# Patient Record
Sex: Male | Born: 1945 | Race: White | Hispanic: No | Marital: Married | Smoking: Former smoker
Health system: Southern US, Community
[De-identification: ages and names within clinical notes are randomized; demographics above are authoritative.]

## PROBLEM LIST (undated history)

## (undated) DIAGNOSIS — C801 Malignant (primary) neoplasm, unspecified: Secondary | ICD-10-CM

## (undated) DIAGNOSIS — I10 Essential (primary) hypertension: Secondary | ICD-10-CM

## (undated) DIAGNOSIS — M21371 Foot drop, right foot: Secondary | ICD-10-CM

## (undated) DIAGNOSIS — N184 Chronic kidney disease, stage 4 (severe): Secondary | ICD-10-CM

## (undated) DIAGNOSIS — I313 Pericardial effusion (noninflammatory): Secondary | ICD-10-CM

## (undated) DIAGNOSIS — I2510 Atherosclerotic heart disease of native coronary artery without angina pectoris: Secondary | ICD-10-CM

## (undated) DIAGNOSIS — I3139 Other pericardial effusion (noninflammatory): Secondary | ICD-10-CM

## (undated) DIAGNOSIS — M1990 Unspecified osteoarthritis, unspecified site: Secondary | ICD-10-CM

## (undated) DIAGNOSIS — Z8701 Personal history of pneumonia (recurrent): Secondary | ICD-10-CM

## (undated) DIAGNOSIS — I5020 Unspecified systolic (congestive) heart failure: Secondary | ICD-10-CM

## (undated) DIAGNOSIS — Z95810 Presence of automatic (implantable) cardiac defibrillator: Secondary | ICD-10-CM

## (undated) DIAGNOSIS — L0390 Cellulitis, unspecified: Secondary | ICD-10-CM

## (undated) DIAGNOSIS — Z9289 Personal history of other medical treatment: Secondary | ICD-10-CM

## (undated) HISTORY — PX: SKIN CANCER EXCISION: SHX779

## (undated) HISTORY — PX: EYE SURGERY: SHX253

## (undated) HISTORY — PX: INSERT / REPLACE / REMOVE PACEMAKER: SUR710

## (undated) HISTORY — PX: OTHER SURGICAL HISTORY: SHX169

---

## 1973-02-13 HISTORY — PX: KIDNEY TRANSPLANT: SHX239

## 1990-03-15 HISTORY — PX: CORONARY ANGIOPLASTY: SHX604

## 2007-09-07 ENCOUNTER — Ambulatory Visit: Payer: Self-pay | Admitting: Cardiology

## 2008-02-12 ENCOUNTER — Other Ambulatory Visit: Payer: Self-pay | Admitting: Orthopedic Surgery

## 2008-02-16 ENCOUNTER — Ambulatory Visit (HOSPITAL_COMMUNITY): Admission: RE | Admit: 2008-02-16 | Discharge: 2008-02-16 | Payer: Self-pay | Admitting: Radiation Oncology

## 2009-11-15 DIAGNOSIS — Z8701 Personal history of pneumonia (recurrent): Secondary | ICD-10-CM

## 2009-11-15 HISTORY — DX: Personal history of pneumonia (recurrent): Z87.01

## 2011-03-30 NOTE — Op Note (Signed)
NAME:  [name], [name] NO.:  [id number]   MEDICAL RECORD NO.:  [id number]          PATIENT TYPE:  AMB   LOCATION:  SDS                          FACILITY:  [facility]   PHYSICIAN:  Leonides Grills, M.D.     DATE OF BIRTH:  18-Oct-1946   DATE OF PROCEDURE:  02/16/2008  DATE OF DISCHARGE:                               OPERATIVE REPORT   PREOPERATIVE DIAGNOSES:  1. Left cavus foot.  2. Left base of fifth metatarsal spur.  3. Left tight gastrocnemius.   POSTOPERATIVE DIAGNOSES:  1. Left cavus foot.  2. Left base of fifth metatarsal spur.  3. Left tight gastrocnemius   OPERATION:  1. Dorsiflexion left first metatarsal osteotomy.  2. Stress x-rays left foot.  3. Left gastrocnemius slide.  4. Partial excision plantar aspect base left fifth metatarsal.   ANESTHESIA:  General.   SURGEON:  Leonides Grills, M.D.   ASSISTANT:  Richardean Canal, P.A.   ESTIMATED BLOOD LOSS:  Minimal.   TOURNIQUET TIME:  Approximately an hour.   COMPLICATIONS:  None.   DISPOSITION:  Stable to PR.   INDICATIONS:  This is a 65 year old male who has had a longstanding  painful callous with impending ulcer on the lateral aspect of the right  fifth base of the metatarsal with advanced cavus foot due to plantar  flexed first ray and a tight gastroc.  He was explained the above  procedure, all risks which include infection, neurovascular injury,  nonunion and malunion, hardware rotation or hardware failure, persistent  pain, worsening pain, prolonged recovery, persistent ulceration, wound  complications were all explained.  Questions were encouraged and  answered.   PROCEDURE IN DETAIL:  The patient was brought to the operating room and  placed in supine position after adequate general endotracheal anesthesia  was administered as well as Ancef 1 gram IV piggyback.  The left lower  extremity was then prepped and draped in a sterile manner with a  proximally placed thigh tourniquet.  A longitudinal  incision was made  over the medial aspect of the gastrocnemius musculotendinous junction.  Dissection was carried down through skin.  Hemostasis was obtained.  Fascia was opened in line with the incision.  Conjoined region was then  developed between the gastroc and soleus.  Soft tissue was elevated off  the posterior aspect of the gastrocnemius.  Sural nerve was identified  and protected posteriorly.  The gastrocnemius was then released with  curved Mayo scissors.  This then released the tight gastroc.  The area  was copiously irrigated with normal saline.  Subcu was closed with 3-0  Vicryl.  Skin was closed with 4-0 nylon.  The limb was then gravity  exsanguinated.  Tourniquet was elevated at 290 mmHg.  A longitudinal  incision was just made on the dorsal lateral aspect of the base of the  left fifth metatarsal.  Dissection was carried down directly to bone and  soft tissue was elevated off the base of the fifth metatarsal.  There  was no purulence.  Then with a sagittal saw in the transverse plane the  base  of the fifth metatarsal was then removed.  Once this was done bone  wax was applied to the exposed bony surfaces.  X-rays were obtained in  the lateral plane that showed that this was adequately decompressed.  The area was copiously irrigated with normal saline.  We then made a  longitudinal incision just medial to the EHL tendon.  Dissection was  carried down through skin and hemostasis was obtained.  First TMT joint  was then entered.  Approximately a cm distal to this an osteotomy was  then made closing wedge type with a sagittal saw protecting the soft  tissues medially and laterally.  Once this was done a four hole T plate  from the mini frag set was then applied.  We then placed four 2.0 mm  fully threaded cortical set screws using 1.5 mm respectively.  This had  excellent purchase and maintenance of the compression that was applied  through a 2 point reduction clamp.  Once  this was done the area was  copiously irrigated with normal saline.  Tourniquet was deflated.  Hemostasis was obtained.  There was no pulsatile bleeding.  Subcu was  closed with 0 Vicryl.  Skin was closed with 4-0 nylon.  Over all wounds  sterile dressing was applied.  Modified Shadoan dressing was applied.  A  CAM walker boot was applied.  The patient was stable to the PR.      Leonides Grills, M.D.  Electronically Signed     PB/MEDQ  D:  02/16/2008  T:  02/16/2008  Job:  [id number]

## 2011-08-09 LAB — BASIC METABOLIC PANEL
BUN: 26 — ABNORMAL HIGH
Calcium: 9.2
Creatinine, Ser: 1.37
GFR calc non Af Amer: 53 — ABNORMAL LOW
Glucose, Bld: 162 — ABNORMAL HIGH

## 2011-08-10 LAB — APTT: aPTT: 22 — ABNORMAL LOW

## 2011-08-10 LAB — PROTIME-INR: Prothrombin Time: 12.2

## 2011-08-10 LAB — CBC
Platelets: 141 — ABNORMAL LOW
RDW: 14.8
WBC: 8

## 2011-08-10 LAB — BASIC METABOLIC PANEL
BUN: 27 — ABNORMAL HIGH
Creatinine, Ser: 1.37
GFR calc non Af Amer: 53 — ABNORMAL LOW
Glucose, Bld: 179 — ABNORMAL HIGH

## 2012-01-14 DIAGNOSIS — I5020 Unspecified systolic (congestive) heart failure: Secondary | ICD-10-CM

## 2012-01-14 HISTORY — DX: Unspecified systolic (congestive) heart failure: I50.20

## 2012-04-05 ENCOUNTER — Encounter (HOSPITAL_BASED_OUTPATIENT_CLINIC_OR_DEPARTMENT_OTHER): Payer: PRIVATE HEALTH INSURANCE | Attending: Plastic Surgery

## 2012-04-05 DIAGNOSIS — N189 Chronic kidney disease, unspecified: Secondary | ICD-10-CM | POA: Insufficient documentation

## 2012-04-05 DIAGNOSIS — T8189XA Other complications of procedures, not elsewhere classified, initial encounter: Secondary | ICD-10-CM | POA: Insufficient documentation

## 2012-04-05 DIAGNOSIS — Z940 Kidney transplant status: Secondary | ICD-10-CM | POA: Insufficient documentation

## 2012-04-05 DIAGNOSIS — Z950 Presence of cardiac pacemaker: Secondary | ICD-10-CM | POA: Insufficient documentation

## 2012-04-05 DIAGNOSIS — Y849 Medical procedure, unspecified as the cause of abnormal reaction of the patient, or of later complication, without mention of misadventure at the time of the procedure: Secondary | ICD-10-CM | POA: Insufficient documentation

## 2012-04-05 DIAGNOSIS — Z85828 Personal history of other malignant neoplasm of skin: Secondary | ICD-10-CM | POA: Insufficient documentation

## 2012-04-05 DIAGNOSIS — Z79899 Other long term (current) drug therapy: Secondary | ICD-10-CM | POA: Insufficient documentation

## 2012-04-05 DIAGNOSIS — E119 Type 2 diabetes mellitus without complications: Secondary | ICD-10-CM | POA: Insufficient documentation

## 2012-04-05 DIAGNOSIS — I129 Hypertensive chronic kidney disease with stage 1 through stage 4 chronic kidney disease, or unspecified chronic kidney disease: Secondary | ICD-10-CM | POA: Insufficient documentation

## 2012-04-05 DIAGNOSIS — I519 Heart disease, unspecified: Secondary | ICD-10-CM | POA: Insufficient documentation

## 2012-04-05 NOTE — Progress Notes (Signed)
[facility]  NAME:  [name], [name] NO.:  [id number]  MEDICAL RECORD NO.:  [id number]      DATE OF BIRTH:  [birth year]  PHYSICIAN:  Ardath Sax, M.D.           VISIT DATE:                                  OFFICE VISIT   This is a 66 year old gentleman, who comes to [place] because of 2 lesions on his left leg; the superior one up around the knee is about 2 x 2 cm and then he has a smaller one just above the ankle anteriorly that is only about 3 or 4 mm.  These all came from biopsies and excisions of squamous cell carcinomas which she tends to get and the one superiorly has got a lot of devascularized infected tissue that I debrided off today.  This man has many comorbidities including heart disease, diabetes, he has a pacemaker.  He was recently in the hospital where apparently he had several cardiac arrests and had to be resuscitated, and he now has a pacemaker.  He also is on many medicines, most notably Prograf to help prevent rejection of his kidney transplant.  He was on dialysis prior to his kidney transplant.  He is going to be taking care of here in the clinic by multiple debridements, and I am going to use Santyl.  He also has a lot of edema, and I am going to give him a week's worth of Lasix. We instructed him how to use the [facility] and he will do this easily.  He is well equipped to take care of these if we instruct him how to do.  He has had congestive heart failure in the past and hypertension along with his renal failure and diabetes, and he is on  Zantac, prednisone 5 mg a day, NovoLog, carvedilol, ferrous sulfate, and vitamins, and Fosamax. He was treated today after we debrided him.  We put Santyl dressings on the upper wound, and smaller lower one, we put some collagen in the wound, and we gave him some from here to change it every day, so he was found to be afebrile.  His blood pressure is 140/84, his temperature is 98.6.  He  will come back here in a week.  DIAGNOSIS: 1. Open wounds of the left leg due to excisions of skin lesions     related to squamous cell carcinoma. 2. Diabetes. 3. Renal failure. 4. Hypertension. 5. Pacemaker for heart block. 6. Renal transplant patient.     Ardath Sax, M.D.     PP/MEDQ  D:  04/05/2012  T:  04/05/2012  Job:  [id number]

## 2012-04-21 ENCOUNTER — Encounter (HOSPITAL_BASED_OUTPATIENT_CLINIC_OR_DEPARTMENT_OTHER): Payer: PRIVATE HEALTH INSURANCE | Attending: Plastic Surgery

## 2012-04-21 ENCOUNTER — Ambulatory Visit (HOSPITAL_COMMUNITY)
Admission: RE | Admit: 2012-04-21 | Discharge: 2012-04-21 | Disposition: A | Discharge status: Home / Self Care | Payer: PRIVATE HEALTH INSURANCE | Source: Ambulatory Visit | Attending: Plastic Surgery | Admitting: Plastic Surgery

## 2012-04-21 ENCOUNTER — Other Ambulatory Visit (HOSPITAL_COMMUNITY): Payer: Self-pay | Admitting: Plastic Surgery

## 2012-04-21 DIAGNOSIS — IMO0002 Reserved for concepts with insufficient information to code with codable children: Secondary | ICD-10-CM | POA: Insufficient documentation

## 2012-04-21 DIAGNOSIS — Z79899 Other long term (current) drug therapy: Secondary | ICD-10-CM | POA: Insufficient documentation

## 2012-04-21 DIAGNOSIS — Z85828 Personal history of other malignant neoplasm of skin: Secondary | ICD-10-CM | POA: Insufficient documentation

## 2012-04-21 DIAGNOSIS — T8189XA Other complications of procedures, not elsewhere classified, initial encounter: Secondary | ICD-10-CM | POA: Insufficient documentation

## 2012-04-21 DIAGNOSIS — Y838 Other surgical procedures as the cause of abnormal reaction of the patient, or of later complication, without mention of misadventure at the time of the procedure: Secondary | ICD-10-CM | POA: Insufficient documentation

## 2012-04-21 DIAGNOSIS — Z940 Kidney transplant status: Secondary | ICD-10-CM | POA: Insufficient documentation

## 2012-04-21 DIAGNOSIS — E119 Type 2 diabetes mellitus without complications: Secondary | ICD-10-CM | POA: Insufficient documentation

## 2012-04-21 NOTE — Progress Notes (Signed)
[facility]  NAME:  [name], [name] NO.:  [id number]  MEDICAL RECORD NO.:  [id number]      DATE OF BIRTH:  15-Jul-1946  PHYSICIAN:  Joanne Gavel, M.D.         VISIT DATE:  04/21/2012                                  OFFICE VISIT   Letter of Medical Necessity.  CHIEF COMPLAINT:  Wounds, left leg.  HISTORY OF PRESENT ILLNESS:  This is a 66 year old diabetic male status post kidney transplant for 40 years.  He is on prednisone and Imuran. He was operated on for skin cancer several months ago.  The wounds have fail to heal.  After debridement of dead tendon and dead fibrinous tissue, the anterior surface of the tibia is easily exposed.  The wound itself is 2.9 x 2.8 cm.  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     Joanne Gavel, M.D.     RA/MEDQ  D:  04/21/2012  T:  04/21/2012  Job:  [id number]

## 2012-04-28 ENCOUNTER — Ambulatory Visit (HOSPITAL_COMMUNITY)
Admission: RE | Admit: 2012-04-28 | Discharge: 2012-04-28 | Disposition: A | Discharge status: Home / Self Care | Payer: PRIVATE HEALTH INSURANCE | Source: Ambulatory Visit | Attending: Plastic Surgery | Admitting: Plastic Surgery

## 2012-04-28 ENCOUNTER — Other Ambulatory Visit (HOSPITAL_BASED_OUTPATIENT_CLINIC_OR_DEPARTMENT_OTHER): Payer: Self-pay | Admitting: General Surgery

## 2012-04-28 ENCOUNTER — Other Ambulatory Visit (HOSPITAL_COMMUNITY): Payer: Self-pay | Admitting: Plastic Surgery

## 2012-04-28 DIAGNOSIS — M86162 Other acute osteomyelitis, left tibia and fibula: Secondary | ICD-10-CM

## 2012-04-28 DIAGNOSIS — Z01818 Encounter for other preprocedural examination: Secondary | ICD-10-CM | POA: Insufficient documentation

## 2012-04-28 DIAGNOSIS — T8189XA Other complications of procedures, not elsewhere classified, initial encounter: Secondary | ICD-10-CM

## 2012-04-28 DIAGNOSIS — Z950 Presence of cardiac pacemaker: Secondary | ICD-10-CM | POA: Insufficient documentation

## 2012-04-28 DIAGNOSIS — I517 Cardiomegaly: Secondary | ICD-10-CM | POA: Insufficient documentation

## 2012-04-28 DIAGNOSIS — R918 Other nonspecific abnormal finding of lung field: Secondary | ICD-10-CM | POA: Insufficient documentation

## 2012-04-28 DIAGNOSIS — I700 Atherosclerosis of aorta: Secondary | ICD-10-CM | POA: Insufficient documentation

## 2012-05-10 ENCOUNTER — Encounter (HOSPITAL_BASED_OUTPATIENT_CLINIC_OR_DEPARTMENT_OTHER): Payer: PRIVATE HEALTH INSURANCE

## 2012-05-15 ENCOUNTER — Encounter (HOSPITAL_BASED_OUTPATIENT_CLINIC_OR_DEPARTMENT_OTHER): Payer: PRIVATE HEALTH INSURANCE | Attending: Plastic Surgery

## 2012-05-15 DIAGNOSIS — I509 Heart failure, unspecified: Secondary | ICD-10-CM | POA: Insufficient documentation

## 2012-05-15 DIAGNOSIS — I2510 Atherosclerotic heart disease of native coronary artery without angina pectoris: Secondary | ICD-10-CM | POA: Insufficient documentation

## 2012-05-15 DIAGNOSIS — Z950 Presence of cardiac pacemaker: Secondary | ICD-10-CM | POA: Insufficient documentation

## 2012-05-15 DIAGNOSIS — Z992 Dependence on renal dialysis: Secondary | ICD-10-CM | POA: Insufficient documentation

## 2012-05-15 DIAGNOSIS — I10 Essential (primary) hypertension: Secondary | ICD-10-CM | POA: Insufficient documentation

## 2012-05-15 DIAGNOSIS — L97809 Non-pressure chronic ulcer of other part of unspecified lower leg with unspecified severity: Secondary | ICD-10-CM | POA: Insufficient documentation

## 2012-05-15 DIAGNOSIS — Z85828 Personal history of other malignant neoplasm of skin: Secondary | ICD-10-CM | POA: Insufficient documentation

## 2012-05-15 DIAGNOSIS — Z940 Kidney transplant status: Secondary | ICD-10-CM | POA: Insufficient documentation

## 2012-05-15 DIAGNOSIS — IMO0002 Reserved for concepts with insufficient information to code with codable children: Secondary | ICD-10-CM | POA: Insufficient documentation

## 2012-05-15 DIAGNOSIS — Z79899 Other long term (current) drug therapy: Secondary | ICD-10-CM | POA: Insufficient documentation

## 2012-05-15 DIAGNOSIS — E1169 Type 2 diabetes mellitus with other specified complication: Secondary | ICD-10-CM | POA: Insufficient documentation

## 2012-05-15 DIAGNOSIS — Z7982 Long term (current) use of aspirin: Secondary | ICD-10-CM | POA: Insufficient documentation

## 2012-05-16 NOTE — Progress Notes (Signed)
[facility]  NAME:  [name], [name] NO.:  [id number]  MEDICAL RECORD NO.:  [id number]      DATE OF BIRTH:  [birth year]  PHYSICIAN:  Wayland Denis, DO       VISIT DATE:  05/15/2012                                  OFFICE VISIT   The patient is a 66 year old gentleman, who is here for followup on his left lower extremity ulcer.  He has been using Santyl and alginate. This was originally a skin cancer that was excised, and he has had a tremendous amount of pain in that leg over the last few days.  He does contribute it to walking quite a bit at [employer] with his wife and noted that when he rested on Sunday, there was a great improvement in the leg pain.  He had a squamous cell carcinoma with excision and negative margins.  The patient had a kidney transplant 39 years ago and is on prednisone.  He has multiple other medical conditions as well, including angioplasty, heart catheterization, fistula placement with kidney dialysis, coronary artery disease, hypertension, and congestive heart failure.  He had a pacemaker placed recently, so we would not be able to do an MRI.  MEDICATIONS:  Include aspirin, simvastatin, Zantac, ranitidine, prednisone, NitroQuick, Xolair, Levemir, NovoLog, Carvedilol, Lasix, iron, and he stopped his Fosamax about a week ago.  He did take vitamin C and fish oil.  ALLERGIES:  He does not have any allergies.  PHYSICAL EXAMINATION:  GENERAL:  He is alert, oriented, cooperative, not in any acute distress.  He is pleasant. HEENT:  Pupils are equal.  Extraocular muscles are intact.  No cervical lymphadenopathy.  He has corneal exposure of his lower eyelids with poor lower eyelid function, but he does see an eye doctor.  He has scleral shell as well. SKIN:  He has very poor skin throughout likely secondary to the immunosuppression and a history of skin cancer. LUNGS:  Breathing is unlabored. HEART:  Regular. ABDOMEN:   Soft.  The wound is showing signs of healing and epithelialization on the edges with granulation.  Recommend continuing with the Santyl alginate, and we will re-examine in 1 week.  I recommend compression stockings and we may need to wrap this next week, but it is high up in the leg and the upper- third of the lower leg, the end of the wrap was going to hit just above the ulcer and I am concerned that it might cause some irritation.     Wayland Denis, DO    CS/MEDQ  D:  05/15/2012  T:  05/15/2012  Job:  [id number]

## 2012-05-19 ENCOUNTER — Encounter (HOSPITAL_BASED_OUTPATIENT_CLINIC_OR_DEPARTMENT_OTHER): Payer: PRIVATE HEALTH INSURANCE

## 2012-05-22 NOTE — Progress Notes (Signed)
[facility]  NAME:  SHONDELL, POULSON NO.:  [id number]  MEDICAL RECORD NO.:  [id number]      DATE OF BIRTH:  [birth year]  PHYSICIAN:  Wayland Denis, DO            VISIT DATE:                                  OFFICE VISIT   The patient is a 66 year old gentleman who is here for a followup on his left lower extremity ulcer that is just distal to his knee.  He has been treated with Santyl over the past week with really good improvement.  We were able to debride it.  With those notes noted in the note chart, he has good granulation tissue underneath it.  There has been no change in his medications or social history.  He did check with his renal doctor and was okayed to take the doxycycline 100 mg 1 twice a day, and he will check about the vitamin A as well.  PHYSICAL EXAMINATION:  GENERAL:  He is alert, oriented, cooperative, not in any acute distress.  He is pleasant. HEENT:  Pupils equal.  Extraocular muscles intact.  No cervical lymphadenopathy.  Breathing is unlabored. HEART:  Regular.  The wound is described above.  We will continue with the Santyl and add Collagen and hopefully next week, we will be able to add an Oasis.  I think that would help this to heal much faster.  In the meantime, he will check on the vitamin A.     Wayland Denis, DO     CS/MEDQ  D:  05/22/2012  T:  05/22/2012  Job:  [id number]

## 2012-05-29 NOTE — Progress Notes (Signed)
[facility]  NAME:  [name], [name] NO.:  [id number]  MEDICAL RECORD NO.:  [id number]      DATE OF BIRTH:  [birth year]  PHYSICIAN:  Wayland Denis, DO       VISIT DATE:  05/29/2012                                  OFFICE VISIT   The patient is a 66 year old gentleman here for followup on his left lower extremity ulcer.  He has diabetes and had a kidney transplant number of years ago.  He has been using Santyl and collagen over this past week.  The wound looks good.  He complains of a lot of pain around his knee even tender to touch.  This does not look like it is related to the wound, but looks more like a bursitis.  The wound is granulating, there, it does not look infected.  He did have a previous x-ray that was worrisome for osteo, but we are not able to get an MRI because of his pacemaker.  So, we need to have to get a CT or PET scan probably with contrast, which would affect his kidneys.  He is on a generic type of Accutane.  So, we are going to have him hold off on the vitamin A.  No other change in his medications and he is still taking the doxycycline.  SOCIAL HISTORY:  Unchanged.  PHYSICAL EXAMINATION:  He is alert, oriented, cooperative, not in any acute distress.  He is pleasant.  Pupils are equal.  Extraocular muscles intact.  No cervical lymphadenopathy.  His breathing is unlabored and his heart is regular.  The wound is as noted above and noted in the nurse's notes.  So, Oasis was placed.  After time-out was called, all information was confirmed to be correct, 100% of it was used with sterile technique.  Dressing was applied and we will see him back in a week.  We will also refer him to Dr. Lajoyce Corners, refilled the doxycycline and have him hold the vitamin A.     Alan Ripper Sanger, DO     CS/MEDQ  D:  05/29/2012  T:  05/29/2012  Job:  [id number]

## 2012-06-06 NOTE — Progress Notes (Signed)
[facility]  NAME:  [name], STRAKA NO.:  [id number]  MEDICAL RECORD NO.:  [id number]      DATE OF BIRTH:  October 21, 1946  PHYSICIAN:  Wayland Denis, DO       VISIT DATE:  06/05/2012                                  OFFICE VISIT   The patient is a 66 year old gentleman who is here for follow up on his left lower extremity ulcer.  He had Oasis placed last week and he has some improvement with granulation and a little epithelialization at the edges.  He also want to see Dr. August Saucer and had his knee tapped and cortisone injected and he feels much better.  He said the pain is nearly all resolved.  Then, he felt the improvement in 2 day's time.  There has been no change in his medications or social history.  On exam, he is alert, oriented, cooperative, not in any acute distress. He is pleasant.  Pupils are equal.  Extraocular muscles are intact.  No cervical lymphadenopathy.  His breathing is unlabored.  His heart is regular.  The Oasis was placed according to the manufacture guidelines, all of that was used.  It was activated with normal saline and the dressing was applied.  We will see him back in 1 week.     Wayland Denis, DO     CS/MEDQ  D:  06/05/2012  T:  06/06/2012  Job:  [id number]

## 2012-06-13 ENCOUNTER — Other Ambulatory Visit: Payer: Self-pay | Admitting: Orthopedic Surgery

## 2012-06-13 DIAGNOSIS — R531 Weakness: Secondary | ICD-10-CM

## 2012-06-13 DIAGNOSIS — M25562 Pain in left knee: Secondary | ICD-10-CM

## 2012-06-13 NOTE — Progress Notes (Signed)
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  He had Oasis placed last week and it shows some very good improvement.  He complains of some pain at the knee area and said that it felt better after it was tapped, but his doctors out of town.  I talk to him about calling to have somebody who is covering for him, deal with the pain that is related to that since it is not related to the wound.  There has been no change in his medications or social history.  PHYSICAL EXAMINATION:  GENERAL:  He is alert, oriented, cooperative, not in any acute distress.  He is pleasant. HEENT:  Pupils equal.  Extraocular muscles are intact.  Oasis was applied according to the manufacture guidelines and activated with normal saline.  After time-out was called, all information was confirmed to be correct.  All of the Oasis was used and dressing was then applied, and he will follow up in 1 week.     Wayland Denis, DO     CS/MEDQ  D:  06/12/2012  T:  06/13/2012  Job:  [id number]

## 2012-06-14 ENCOUNTER — Ambulatory Visit
Admission: RE | Admit: 2012-06-14 | Discharge: 2012-06-14 | Disposition: A | Discharge status: Home / Self Care | Payer: PRIVATE HEALTH INSURANCE | Source: Ambulatory Visit | Attending: Orthopedic Surgery | Admitting: Orthopedic Surgery

## 2012-06-14 DIAGNOSIS — R531 Weakness: Secondary | ICD-10-CM

## 2012-06-14 DIAGNOSIS — M25562 Pain in left knee: Secondary | ICD-10-CM

## 2012-06-14 MED ORDER — IOHEXOL 180 MG/ML  SOLN
25.0000 mL | Freq: Once | INTRAMUSCULAR | Status: AC | PRN
  Administered 2012-06-14: 25 mL via INTRA_ARTICULAR

## 2012-06-16 ENCOUNTER — Encounter (HOSPITAL_BASED_OUTPATIENT_CLINIC_OR_DEPARTMENT_OTHER): Payer: PRIVATE HEALTH INSURANCE

## 2012-06-19 ENCOUNTER — Encounter (HOSPITAL_BASED_OUTPATIENT_CLINIC_OR_DEPARTMENT_OTHER): Payer: PRIVATE HEALTH INSURANCE | Attending: Plastic Surgery

## 2012-06-19 DIAGNOSIS — Z7982 Long term (current) use of aspirin: Secondary | ICD-10-CM | POA: Insufficient documentation

## 2012-06-19 DIAGNOSIS — I2510 Atherosclerotic heart disease of native coronary artery without angina pectoris: Secondary | ICD-10-CM | POA: Insufficient documentation

## 2012-06-19 DIAGNOSIS — L97809 Non-pressure chronic ulcer of other part of unspecified lower leg with unspecified severity: Secondary | ICD-10-CM | POA: Insufficient documentation

## 2012-06-19 DIAGNOSIS — Z992 Dependence on renal dialysis: Secondary | ICD-10-CM | POA: Insufficient documentation

## 2012-06-19 DIAGNOSIS — IMO0002 Reserved for concepts with insufficient information to code with codable children: Secondary | ICD-10-CM | POA: Insufficient documentation

## 2012-06-19 DIAGNOSIS — Z79899 Other long term (current) drug therapy: Secondary | ICD-10-CM | POA: Insufficient documentation

## 2012-06-19 DIAGNOSIS — Z950 Presence of cardiac pacemaker: Secondary | ICD-10-CM | POA: Insufficient documentation

## 2012-06-19 DIAGNOSIS — Z940 Kidney transplant status: Secondary | ICD-10-CM | POA: Insufficient documentation

## 2012-06-19 DIAGNOSIS — E1169 Type 2 diabetes mellitus with other specified complication: Secondary | ICD-10-CM | POA: Insufficient documentation

## 2012-06-19 DIAGNOSIS — I10 Essential (primary) hypertension: Secondary | ICD-10-CM | POA: Insufficient documentation

## 2012-06-19 DIAGNOSIS — I509 Heart failure, unspecified: Secondary | ICD-10-CM | POA: Insufficient documentation

## 2012-06-19 DIAGNOSIS — Z85828 Personal history of other malignant neoplasm of skin: Secondary | ICD-10-CM | POA: Insufficient documentation

## 2012-06-20 NOTE — Progress Notes (Signed)
[facility]  NAME:  [name], [name] NO.:  [id number]  MEDICAL RECORD NO.:  [id number]      DATE OF BIRTH:  29-Jan-1946  PHYSICIAN:  Wayland Denis, DO       VISIT DATE:  06/19/2012                                  OFFICE VISIT   The patient is a 66 year old gentleman who is here for followup on his left lower extremity ulcer.  He has been using the Oasis over the past week.  He also went to the orthopedic surgeon and had a procedure done on his knee and he says he is feeling much, much better.  The wound overall is looking better as well.  There has been no change in medications or social history.  The only change in his medical history is the left knee procedure. Time-out was called.  All information was confirmed to be correct.  Oasis was placed on the wound, all of it was used, it was activated with normal saline and a dressing was applied. We will have the patient in follow up in 1 week.     Wayland Denis, DO     CS/MEDQ  D:  06/19/2012  T:  06/20/2012  Job:  [id number]

## 2012-06-27 NOTE — Progress Notes (Signed)
[facility]  NAME:  [name], [name] NO.:  [id number]  MEDICAL RECORD NO.:  [id number]      DATE OF BIRTH:  01-16-1946  PHYSICIAN:  Wayland Denis, DO       VISIT DATE:  06/26/2012                                  OFFICE VISIT   The patient is a 66 year old white male, who is here for followup on his left lower extremity ulcer.  We have been using oasis 4 times now with good improvement.  He has also had a bit of improvement in the swelling of his knee.  He had injected a week ago and that seems to have helped quite a bit.  He also has a follow up from and there was some discussion about possible surgery or serial injections and he is going to continue to follow up with ortho regarding that.  There has been no change in his medications or social history.  PHYSICAL EXAMINATION:  GENERAL:  On exam, he is alert, oriented, cooperative, not in any acute distress.  He is pleasant. HEENT:  Pupils equal.  Extraocular muscles are intact.  No cervical lymphadenopathy. CHEST:  Breathing unlabored. HEART:  Regular.  The wound is improving with granulation tissue and some epithelialization around the edges. Oasis was placed all of it was used. He was activated with normal saline.  Time-out was called.  All information was confirmed to be correct.  We will continue with the dressing and have him follow up in 1 week.     Wayland Denis, DO     CS/MEDQ  D:  06/26/2012  T:  06/27/2012  Job:  [id number]

## 2012-07-04 NOTE — Progress Notes (Signed)
[facility]  NAME:  [name], [name]                      ACCOUNT NO.:  MEDICAL RECORD NO.:  [id number]      DATE OF BIRTH:  04/25/1946  PHYSICIAN:  Wayland Denis, DO       VISIT DATE:  07/03/2012                                  OFFICE VISIT   The patient is a 66 year old gentleman who is a renal transplant patient.  He is here for followup on his left lower extremity ulcer secondary to excision of a skin cancer.  We have been putting Oasis on, a total of 5 times and is showing very good improvement.  He has had more granulation tissue and increased epithelialization from the edges. There has been no change in his medications or social history.  He did have a squamous cell cancer excised from his left hand with a skin graft placed this past week and that seems to be doing well according to the patient.  On exam, he is alert, oriented, cooperative, not in any acute distress.  Oasis was applied to the leg again.  All of it was used.  It was prepared according to the manufacture guidelines and activated with normal saline and a dressing applied. We will see him back in 1 week.     Wayland Denis, DO     CS/MEDQ  D:  07/03/2012  T:  07/04/2012  Job:  [id number]

## 2012-07-11 NOTE — Progress Notes (Signed)
[facility]  NAME:  [name], CULBERTSON NO.:  [id number]  MEDICAL RECORD NO.:  [id number]      DATE OF BIRTH:  Mar 06, 1946  PHYSICIAN:  Wayland Denis, DO       VISIT DATE:  07/10/2012                                  OFFICE VISIT   The patient is a 66 year old gentleman who is here for followup on his left lower extremity ulcer.  He has had Oasis placed several times with very good improvement.  He has some hypergranulation today, but the periwound area is markedly improved.  His knee is actually got swollen as well.  There has been no change in his medication or social history.  On exam, he is alert, oriented, cooperative, not in any acute distress. He is pleasant.  Pupils are equal.  Extraocular muscles are intact.  No cervical lymphadenopathy.  His breathing is unlabored.  His heart is irregular, more weight oasis was placed.  It was prepared according to the manufacture's guidelines.  All of it was used, it was doubled over and activated with normal saline.  The dressing was then applied.  We will have the patient follow back up in 2 weeks.  He is likely going to be finished with the Lasix, but we will see how it looks at his followup appointment.     Wayland Denis, DO     CS/MEDQ  D:  07/10/2012  T:  07/11/2012  Job:  [id number]

## 2012-07-21 ENCOUNTER — Encounter (HOSPITAL_COMMUNITY): Payer: Self-pay | Admitting: Pharmacy Technician

## 2012-07-21 ENCOUNTER — Other Ambulatory Visit (HOSPITAL_COMMUNITY): Payer: Self-pay | Admitting: Orthopedic Surgery

## 2012-07-24 ENCOUNTER — Encounter (HOSPITAL_COMMUNITY)
Admission: RE | Admit: 2012-07-24 | Discharge: 2012-07-24 | Disposition: A | Discharge status: Home / Self Care | Payer: PRIVATE HEALTH INSURANCE | Source: Ambulatory Visit | Attending: Orthopedic Surgery | Admitting: Orthopedic Surgery

## 2012-07-24 ENCOUNTER — Encounter (HOSPITAL_BASED_OUTPATIENT_CLINIC_OR_DEPARTMENT_OTHER): Payer: PRIVATE HEALTH INSURANCE | Attending: Plastic Surgery

## 2012-07-24 ENCOUNTER — Encounter (HOSPITAL_COMMUNITY): Payer: Self-pay

## 2012-07-24 ENCOUNTER — Encounter (HOSPITAL_COMMUNITY): Payer: Self-pay | Admitting: *Deleted

## 2012-07-24 DIAGNOSIS — L97809 Non-pressure chronic ulcer of other part of unspecified lower leg with unspecified severity: Secondary | ICD-10-CM | POA: Insufficient documentation

## 2012-07-24 DIAGNOSIS — E119 Type 2 diabetes mellitus without complications: Secondary | ICD-10-CM | POA: Insufficient documentation

## 2012-07-24 DIAGNOSIS — Z940 Kidney transplant status: Secondary | ICD-10-CM | POA: Insufficient documentation

## 2012-07-24 DIAGNOSIS — I10 Essential (primary) hypertension: Secondary | ICD-10-CM | POA: Insufficient documentation

## 2012-07-24 LAB — CBC WITH DIFFERENTIAL/PLATELET
Basophils Relative: 0 % (ref 0–1)
Eosinophils Absolute: 0 10*3/uL (ref 0.0–0.7)
HCT: 27.2 % — ABNORMAL LOW (ref 39.0–52.0)
Hemoglobin: 8.4 g/dL — ABNORMAL LOW (ref 13.0–17.0)
Lymphs Abs: 0.3 10*3/uL — ABNORMAL LOW (ref 0.7–4.0)
MCH: 27.5 pg (ref 26.0–34.0)
MCHC: 30.9 g/dL (ref 30.0–36.0)
MCV: 88.9 fL (ref 78.0–100.0)
Monocytes Absolute: 0.9 10*3/uL (ref 0.1–1.0)
Monocytes Relative: 10 % (ref 3–12)

## 2012-07-24 LAB — BASIC METABOLIC PANEL
BUN: 34 mg/dL — ABNORMAL HIGH (ref 6–23)
CO2: 26 mEq/L (ref 19–32)
Chloride: 101 mEq/L (ref 96–112)
Glucose, Bld: 95 mg/dL (ref 70–99)
Potassium: 4.3 mEq/L (ref 3.5–5.1)

## 2012-07-24 LAB — SURGICAL PCR SCREEN: Staphylococcus aureus: POSITIVE — AB

## 2012-07-24 NOTE — Progress Notes (Signed)
I called Cordelia Pen at Dr Alfonso Patten office and asked for orders.

## 2012-07-24 NOTE — Progress Notes (Addendum)
Pt said he had labs drawn last at Dr Marigene Ehlers office in [place], [place].  I called Dr Marigene Ehlers office and spoke with Baron Hamper and asked for Labs and last office notes to be faxed here.  Pt said that his last Creatinine was 1.6 and that it was drawn last week. Pts cardiologist is Dr Graciella Freer in [place], his nephrologist is in [place], [place].

## 2012-07-24 NOTE — Progress Notes (Signed)
Hussain from [facility] of surgery tomorrow . Eula Listen is faxing local rep.
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
## 2012-07-24 NOTE — Pre-Procedure Instructions (Signed)
20 MILIK GILREATH  07/24/2012   Your procedure is scheduled on:  Tuesday, September 10th.  Report to [facility] at 12:30AM.  Call this number if you have problems the morning of surgery: [phone]   Remember:   Do not eat food or anything to drink:After Midnight.    Take these medicines the morning of surgery with A SIP OF WATER: Carvededilol(Coreg), Mycophenolate (Cellcept), Prednisone , Ranitidine (Zantac).  May take Eye Drops.  May take Oxycodone-Acetaminophen (Percocet) if needed.  Stop taking any Aspirin (unless directed otherwise by your doctor), NSAIDS, Herbal Medications (Fish Oil).   Do not wear jewelry, make-up or nail polish.  Do not wear lotions, powders, or perfumes. You may wear deodorant.  Do not shave 48 hours prior to surgery. Men may shave face and neck.  Do not bring valuables to the hospital.  Contacts, dentures or bridgework may not be worn into surgery.  Leave suitcase in the car. After surgery it may be brought to your room.  For patients admitted to the hospital, checkout time is 11:00 AM the day of discharge.   Patients discharged the day of surgery will not be allowed to drive home.  Name and phone number of your driver: ____________  Special Instructions: CHG Shower Use Special Wash: 1/2 bottle night before surgery and 1/2 bottle morning of surgery.   Please read over the following fact sheets that you were given: Pain Booklet, Coughing and Deep Breathing and Surgical Site Infection Prevention

## 2012-07-25 ENCOUNTER — Ambulatory Visit (HOSPITAL_COMMUNITY): Payer: PRIVATE HEALTH INSURANCE | Admitting: Anesthesiology

## 2012-07-25 ENCOUNTER — Encounter (HOSPITAL_COMMUNITY): Payer: Self-pay | Admitting: Anesthesiology

## 2012-07-25 ENCOUNTER — Encounter (HOSPITAL_COMMUNITY)
Admission: RE | Disposition: A | Discharge status: Home / Self Care | Payer: Self-pay | Source: Ambulatory Visit | Attending: Orthopedic Surgery

## 2012-07-25 ENCOUNTER — Ambulatory Visit (HOSPITAL_COMMUNITY)
Admission: RE | Admit: 2012-07-25 | Discharge: 2012-07-27 | Disposition: A | Discharge status: Home / Self Care | Payer: PRIVATE HEALTH INSURANCE | Source: Ambulatory Visit | Attending: Orthopedic Surgery | Admitting: Orthopedic Surgery

## 2012-07-25 DIAGNOSIS — M869 Osteomyelitis, unspecified: Secondary | ICD-10-CM

## 2012-07-25 DIAGNOSIS — I10 Essential (primary) hypertension: Secondary | ICD-10-CM | POA: Insufficient documentation

## 2012-07-25 DIAGNOSIS — Z940 Kidney transplant status: Secondary | ICD-10-CM | POA: Insufficient documentation

## 2012-07-25 DIAGNOSIS — M009 Pyogenic arthritis, unspecified: Secondary | ICD-10-CM | POA: Insufficient documentation

## 2012-07-25 DIAGNOSIS — M00869 Arthritis due to other bacteria, unspecified knee: Secondary | ICD-10-CM

## 2012-07-25 DIAGNOSIS — E119 Type 2 diabetes mellitus without complications: Secondary | ICD-10-CM | POA: Insufficient documentation

## 2012-07-25 DIAGNOSIS — Z95810 Presence of automatic (implantable) cardiac defibrillator: Secondary | ICD-10-CM | POA: Insufficient documentation

## 2012-07-25 HISTORY — PX: KNEE ARTHROSCOPY: SHX127

## 2012-07-25 HISTORY — DX: Malignant (primary) neoplasm, unspecified: C80.1

## 2012-07-25 HISTORY — DX: Presence of automatic (implantable) cardiac defibrillator: Z95.810

## 2012-07-25 HISTORY — DX: Personal history of other medical treatment: Z92.89

## 2012-07-25 HISTORY — DX: Unspecified osteoarthritis, unspecified site: M19.90

## 2012-07-25 HISTORY — DX: Essential (primary) hypertension: I10

## 2012-07-25 LAB — GLUCOSE, CAPILLARY
Glucose-Capillary: 40 mg/dL — CL (ref 70–99)
Glucose-Capillary: 59 mg/dL — ABNORMAL LOW (ref 70–99)
Glucose-Capillary: 63 mg/dL — ABNORMAL LOW (ref 70–99)
Glucose-Capillary: 118 mg/dL — ABNORMAL HIGH (ref 70–99)
Glucose-Capillary: 101 mg/dL — ABNORMAL HIGH (ref 70–99)

## 2012-07-25 SURGERY — ARTHROSCOPY, KNEE
Anesthesia: General | Site: Knee | Laterality: Left | Wound class: Dirty or Infected

## 2012-07-25 MED ORDER — MUPIROCIN 2 % EX OINT
TOPICAL_OINTMENT | Freq: Two times a day (BID) | CUTANEOUS | Status: DC
  Filled 2012-07-25: qty 22

## 2012-07-25 MED ORDER — PROPOFOL 10 MG/ML IV BOLUS
INTRAVENOUS | Status: DC | PRN
  Administered 2012-07-25: 120 mg via INTRAVENOUS

## 2012-07-25 MED ORDER — MORPHINE SULFATE 4 MG/ML IJ SOLN
INTRAMUSCULAR | Status: DC | PRN
  Administered 2012-07-25: 8 mg

## 2012-07-25 MED ORDER — LIDOCAINE HCL (CARDIAC) 20 MG/ML IV SOLN
INTRAVENOUS | Status: DC | PRN
  Administered 2012-07-25: 75 mg via INTRAVENOUS

## 2012-07-25 MED ORDER — FENTANYL CITRATE 0.05 MG/ML IJ SOLN
INTRAMUSCULAR | Status: DC | PRN
  Administered 2012-07-25: 50 ug via INTRAVENOUS

## 2012-07-25 MED ORDER — HYDROMORPHONE HCL PF 1 MG/ML IJ SOLN
INTRAMUSCULAR | Status: AC
  Filled 2012-07-25: qty 1

## 2012-07-25 MED ORDER — MIDAZOLAM HCL 5 MG/5ML IJ SOLN
INTRAMUSCULAR | Status: DC | PRN
  Administered 2012-07-25: 2 mg via INTRAVENOUS

## 2012-07-25 MED ORDER — DEXTROSE 5 % IV SOLN
2.0000 g | INTRAVENOUS | Status: DC
  Administered 2012-07-25 – 2012-07-26 (×2): 2 g via INTRAVENOUS
  Filled 2012-07-25 (×4): qty 2

## 2012-07-25 MED ORDER — SODIUM CHLORIDE 0.9 % IR SOLN
Status: DC | PRN
  Administered 2012-07-25: 12000 mL

## 2012-07-25 MED ORDER — HYDROMORPHONE HCL PF 1 MG/ML IJ SOLN
INTRAMUSCULAR | Status: AC
  Administered 2012-07-26: 1 mg via INTRAVENOUS
  Filled 2012-07-25: qty 1

## 2012-07-25 MED ORDER — CLONIDINE HCL (ANALGESIA) 100 MCG/ML EP SOLN
EPIDURAL | Status: DC | PRN
  Administered 2012-07-25: .5 mL

## 2012-07-25 MED ORDER — HYDROMORPHONE HCL PF 1 MG/ML IJ SOLN
1.0000 mg | INTRAMUSCULAR | Status: DC | PRN
  Administered 2012-07-25 – 2012-07-26 (×4): 1 mg via INTRAVENOUS
  Filled 2012-07-25 (×4): qty 1

## 2012-07-25 MED ORDER — OXYCODONE HCL 5 MG PO TABS
10.0000 mg | ORAL_TABLET | ORAL | Status: DC | PRN
  Administered 2012-07-25 – 2012-07-27 (×10): 10 mg via ORAL
  Filled 2012-07-25 (×10): qty 2

## 2012-07-25 MED ORDER — DEXTROSE 50 % IV SOLN
25.0000 mL | Freq: Once | INTRAVENOUS | Status: DC
  Filled 2012-07-25: qty 50

## 2012-07-25 MED ORDER — DEXTROSE 50 % IV SOLN
INTRAVENOUS | Status: AC
  Filled 2012-07-25: qty 50

## 2012-07-25 MED ORDER — DEXTROSE 50 % IV SOLN
25.0000 mL | Freq: Once | INTRAVENOUS | Status: AC | PRN
  Administered 2012-07-25: 25 mL via INTRAVENOUS

## 2012-07-25 MED ORDER — PROMETHAZINE HCL 25 MG/ML IJ SOLN: 6.2500 mg | INTRAMUSCULAR | Status: DC | PRN

## 2012-07-25 MED ORDER — MORPHINE SULFATE 2 MG/ML IJ SOLN
1.0000 mg | INTRAMUSCULAR | Status: DC | PRN
  Administered 2012-07-25: 1 mg via INTRAVENOUS
  Filled 2012-07-25: qty 1

## 2012-07-25 MED ORDER — BUPIVACAINE-EPINEPHRINE (PF) 0.5% -1:200000 IJ SOLN
INTRAMUSCULAR | Status: AC
  Filled 2012-07-25: qty 10

## 2012-07-25 MED ORDER — METOCLOPRAMIDE HCL 10 MG PO TABS: 5.0000 mg | ORAL_TABLET | Freq: Three times a day (TID) | ORAL | Status: DC | PRN

## 2012-07-25 MED ORDER — POTASSIUM CHLORIDE IN NACL 20-0.9 MEQ/L-% IV SOLN
INTRAVENOUS | Status: DC
  Administered 2012-07-26: 04:00:00 via INTRAVENOUS
  Filled 2012-07-25: qty 1000

## 2012-07-25 MED ORDER — CLONIDINE HCL (ANALGESIA) 100 MCG/ML EP SOLN
150.0000 ug | EPIDURAL | Status: DC
  Filled 2012-07-25: qty 1.5

## 2012-07-25 MED ORDER — METOCLOPRAMIDE HCL 5 MG/ML IJ SOLN: 5.0000 mg | Freq: Three times a day (TID) | INTRAMUSCULAR | Status: DC | PRN

## 2012-07-25 MED ORDER — ONDANSETRON HCL 4 MG PO TABS: 4.0000 mg | ORAL_TABLET | Freq: Four times a day (QID) | ORAL | Status: DC | PRN

## 2012-07-25 MED ORDER — HYDROMORPHONE HCL PF 1 MG/ML IJ SOLN
0.2500 mg | INTRAMUSCULAR | Status: DC | PRN
  Administered 2012-07-25 (×3): 0.5 mg via INTRAVENOUS

## 2012-07-25 MED ORDER — LACTATED RINGERS IV SOLN
INTRAVENOUS | Status: DC
  Administered 2012-07-25: 14:00:00 via INTRAVENOUS

## 2012-07-25 MED ORDER — BUPIVACAINE-EPINEPHRINE 0.5% -1:200000 IJ SOLN
INTRAMUSCULAR | Status: DC | PRN
  Administered 2012-07-25: 20 mL

## 2012-07-25 MED ORDER — ONDANSETRON HCL 4 MG/2ML IJ SOLN: 4.0000 mg | Freq: Four times a day (QID) | INTRAMUSCULAR | Status: DC | PRN

## 2012-07-25 MED ORDER — MORPHINE SULFATE 4 MG/ML IJ SOLN
INTRAMUSCULAR | Status: AC
  Filled 2012-07-25: qty 2

## 2012-07-25 SURGICAL SUPPLY — 75 items
BANDAGE ELASTIC 4 VELCRO ST LF (GAUZE/BANDAGES/DRESSINGS) ×2 IMPLANT
BANDAGE ELASTIC 6 VELCRO ST LF (GAUZE/BANDAGES/DRESSINGS) ×2 IMPLANT
BANDAGE ESMARK 6X9 LF (GAUZE/BANDAGES/DRESSINGS) ×1 IMPLANT
BENZOIN TINCTURE PRP APPL 2/3 (GAUZE/BANDAGES/DRESSINGS) ×2 IMPLANT
BLADE CUDA 5.5 (BLADE) IMPLANT
BLADE GREAT WHITE 4.2 (BLADE) ×2 IMPLANT
BLADE SURG 10 STRL SS (BLADE) IMPLANT
BLADE SURG 11 STRL SS (BLADE) ×2 IMPLANT
BLADE SURG 15 STRL LF DISP TIS (BLADE) IMPLANT
BLADE SURG 15 STRL SS (BLADE)
BLADE SURG ROTATE 9660 (MISCELLANEOUS) ×2 IMPLANT
BNDG ESMARK 6X9 LF (GAUZE/BANDAGES/DRESSINGS) ×2
BUR OVAL 6.0 (BURR) IMPLANT
CLOTH BEACON ORANGE TIMEOUT ST (SAFETY) ×2 IMPLANT
COVER MAYO STAND STRL (DRAPES) ×2 IMPLANT
COVER SURGICAL LIGHT HANDLE (MISCELLANEOUS) ×2 IMPLANT
CUFF TOURNIQUET SINGLE 34IN LL (TOURNIQUET CUFF) ×2 IMPLANT
CUFF TOURNIQUET SINGLE 44IN (TOURNIQUET CUFF) IMPLANT
DRAPE ARTHROSCOPY W/POUCH 114 (DRAPES) ×2 IMPLANT
DRAPE INCISE IOBAN 66X45 STRL (DRAPES) ×2 IMPLANT
DRAPE PROXIMA HALF (DRAPES) ×2 IMPLANT
DRAPE U-SHAPE 47X51 STRL (DRAPES) ×2 IMPLANT
DRSG PAD ABDOMINAL 8X10 ST (GAUZE/BANDAGES/DRESSINGS) ×4 IMPLANT
DURAPREP 26ML APPLICATOR (WOUND CARE) ×2 IMPLANT
ELECT REM PT RETURN 9FT ADLT (ELECTROSURGICAL)
ELECTRODE REM PT RTRN 9FT ADLT (ELECTROSURGICAL) IMPLANT
GAUZE XEROFORM 1X8 LF (GAUZE/BANDAGES/DRESSINGS) ×2 IMPLANT
GAUZE XEROFORM 5X9 LF (GAUZE/BANDAGES/DRESSINGS) ×2 IMPLANT
GLOVE BIO SURGEON ST LM GN SZ9 (GLOVE) ×2 IMPLANT
GLOVE BIOGEL PI IND STRL 8 (GLOVE) ×1 IMPLANT
GLOVE BIOGEL PI IND STRL 9 (GLOVE) IMPLANT
GLOVE BIOGEL PI INDICATOR 8 (GLOVE) ×1
GLOVE BIOGEL PI INDICATOR 9 (GLOVE)
GLOVE SURG ORTHO 8.0 STRL STRW (GLOVE) ×2 IMPLANT
GOWN PREVENTION PLUS LG XLONG (DISPOSABLE) IMPLANT
GOWN PREVENTION PLUS XLARGE (GOWN DISPOSABLE) ×2 IMPLANT
GOWN STRL NON-REIN LRG LVL3 (GOWN DISPOSABLE) ×4 IMPLANT
KIT BASIN OR (CUSTOM PROCEDURE TRAY) ×2 IMPLANT
KIT ROOM TURNOVER OR (KITS) ×2 IMPLANT
MANIFOLD NEPTUNE II (INSTRUMENTS) ×2 IMPLANT
NEEDLE 18GX1X1/2 (RX/OR ONLY) (NEEDLE) IMPLANT
NEEDLE HYPO 25GX1X1/2 BEV (NEEDLE) ×2 IMPLANT
NEEDLE SPNL 18GX3.5 QUINCKE PK (NEEDLE) ×2 IMPLANT
NS IRRIG 1000ML POUR BTL (IV SOLUTION) ×6 IMPLANT
PACK ARTHROSCOPY DSU (CUSTOM PROCEDURE TRAY) ×2 IMPLANT
PAD ARMBOARD 7.5X6 YLW CONV (MISCELLANEOUS) ×4 IMPLANT
PAD CAST 4YDX4 CTTN HI CHSV (CAST SUPPLIES) ×1 IMPLANT
PADDING CAST COTTON 4X4 STRL (CAST SUPPLIES) ×1
PADDING CAST COTTON 6X4 STRL (CAST SUPPLIES) ×2 IMPLANT
PENCIL BUTTON HOLSTER BLD 10FT (ELECTRODE) ×2 IMPLANT
SET ARTHROSCOPY TUBING (MISCELLANEOUS) ×1
SET ARTHROSCOPY TUBING LN (MISCELLANEOUS) ×1 IMPLANT
SPONGE GAUZE 4X4 12PLY (GAUZE/BANDAGES/DRESSINGS) ×2 IMPLANT
SPONGE LAP 4X18 X RAY DECT (DISPOSABLE) ×2 IMPLANT
STAPLER VISISTAT 35W (STAPLE) IMPLANT
STRIP CLOSURE SKIN 1/2X4 (GAUZE/BANDAGES/DRESSINGS) IMPLANT
SUCTION FRAZIER TIP 10 FR DISP (SUCTIONS) ×2 IMPLANT
SUT ETHIBOND CT1 BRD #0 30IN (SUTURE) ×2 IMPLANT
SUT ETHILON 3 0 PS 1 (SUTURE) ×2 IMPLANT
SUT FIBERWIRE 2-0 18 17.9 3/8 (SUTURE) ×2
SUT MENISCAL KIT (KITS) IMPLANT
SUT PROLENE 3 0 PS 2 (SUTURE) IMPLANT
SUT VIC AB 0 CT1 27 (SUTURE) ×1
SUT VIC AB 0 CT1 27XBRD ANBCTR (SUTURE) ×1 IMPLANT
SUT VIC AB 2-0 CTB1 (SUTURE) IMPLANT
SUTURE FIBERWR 2-0 18 17.9 3/8 (SUTURE) ×1 IMPLANT
SYR 20ML ECCENTRIC (SYRINGE) ×2 IMPLANT
SYR BULB IRRIGATION 50ML (SYRINGE) ×2 IMPLANT
SYR CONTROL 10ML LL (SYRINGE) ×2 IMPLANT
SYR TB 1ML LUER SLIP (SYRINGE) ×2 IMPLANT
TOWEL OR 17X24 6PK STRL BLUE (TOWEL DISPOSABLE) ×2 IMPLANT
TOWEL OR 17X26 10 PK STRL BLUE (TOWEL DISPOSABLE) ×2 IMPLANT
TUBE CONNECTING 12X1/4 (SUCTIONS) ×2 IMPLANT
WAND 90 DEG TURBOVAC W/CORD (SURGICAL WAND) ×2 IMPLANT
WATER STERILE IRR 1000ML POUR (IV SOLUTION) ×2 IMPLANT

## 2012-07-25 NOTE — Progress Notes (Signed)
Feels sensastion in foot,  sleeping until disturbed for lab work drawn and pt back to sleep waiting on room

## 2012-07-25 NOTE — Progress Notes (Signed)
CBG 59.  Reported to Dr Michelle Piper and remainder of original amp of D50 given as only 1/2 amp was given by previous nurse.

## 2012-07-25 NOTE — Brief Op Note (Signed)
07/25/2012  5:22 PM  PATIENT:  [name]  66 y.o. male  PRE-OPERATIVE DIAGNOSIS:  Left knee effusion, possible infection  POST-OPERATIVE DIAGNOSIS:  Left knee effusion, possible infection  PROCEDURE:  Procedure(s): ARTHROSCOPY KNEE synovectomy  SURGEON:  Surgeon(s): Cammy Copa, MD  ASSISTANT:   ANESTHESIA:   general  EBL: 10 ml       BLOOD ADMINISTERED: none  DRAINS: (l) Hemovact drain(s) in the knee with  Suction Clamped   LOCAL MEDICATIONS USED:  none  SPECIMEN: cxs x 3 COUNTS:  YES  TOURNIQUET:  * No tourniquets in log *  DICTATION: .Other Dictation: Dictation Number [id number]  PLAN OF CARE: Admit for overnight observation  PATIENT DISPOSITION:  PACU - hemodynamically stable

## 2012-07-25 NOTE — Progress Notes (Signed)
DTV

## 2012-07-25 NOTE — Progress Notes (Signed)
Pt ate and drank diet ginger ale and crackers with peanut  butter

## 2012-07-25 NOTE — Progress Notes (Signed)
Patient's hemovac opened per Dr.'s orders at 9:00P [facility]

## 2012-07-25 NOTE — Anesthesia Postprocedure Evaluation (Signed)
  Anesthesia Post-op Note  Patient: [name]  Procedure(s) Performed: Procedure(s) (LRB) with comments: ARTHROSCOPY KNEE (Left) - Left knee arthroscopy, debridement, cultures  Patient Location: PACU  Anesthesia Type: General  Level of Consciousness: awake  Airway and Oxygen Therapy: Patient Spontanous Breathing  Post-op Pain: mild  Post-op Assessment: Post-op Vital signs reviewed, Patient's Cardiovascular Status Stable, Respiratory Function Stable, Patent Airway, No signs of Nausea or vomiting and Pain level controlled  Post-op Vital Signs: stable  Complications: No apparent anesthesia complications

## 2012-07-25 NOTE — Preoperative (Signed)
Beta Blockers   Reason not to administer Beta Blockers:Carvedilol At 0800hrs 07/25/12

## 2012-07-25 NOTE — Transfer of Care (Signed)
Immediate Anesthesia Transfer of Care Note  Patient: [name]  Procedure(s) Performed: Procedure(s) (LRB) with comments: ARTHROSCOPY KNEE (Left) - Left knee arthroscopy, debridement, cultures  Patient Location: PACU  Anesthesia Type: General  Level of Consciousness: awake  Airway & Oxygen Therapy: Patient Spontanous Breathing and Patient connected to nasal cannula oxygen  Post-op Assessment: Report given to PACU RN, Post -op Vital signs reviewed and stable and Patient moving all extremities  Post vital signs: Reviewed and stable  Complications: No apparent anesthesia complications

## 2012-07-25 NOTE — Consult Note (Signed)
INFECTIOUS DISEASE CONSULT NOTE  Date of Admission:  07/25/2012  Date of Consult:  07/25/2012  Reason for Consult:Septic Arthritis, Osteomyelitis Referring Physician: August Saucer  Impression/Recommendation Septic Arthritis Osteomyelitis  Would- insert PIC start  cefepime 2g q24h. Plan for 6 weeks of therapy Check AM CBC, CMP, ESR, CRP Comment- I explained to patient that treating him with PO therapy is possible but would not be considered "standard of care". He agrees to [facility] with som e reservations (wants to attend meeting next week, doesn't want to injure his kidneys). I explained to him that the anbx would have an extremely low likelihood of causing renal damage.  Will see him in AM, will have him f/u in ID clinic as well.     Thank you so much for this interesting consult,   Johny Sax [phone]  [name] is an 66 y.o. male.  HPI: 66 yo M with hx of DM2 x 14 yrs, renal txp 35 yrs ago (on prednisone and cellcept). States he was adm to the hospital in [place] with CHF and since then has had recurrent effusions in his L knee. He states he has had the knee aspirated weekly since then. He denies fever or chills. Has simply had swelling. Denies trauma to his knee.  He had asiration of his knee on 9-5 which grew pseudomonas aeruginosa (pan-sensitive), 28,790 WBC. He is clear that he does not want a port to treat his infection.   Past Medical History  Diagnosis Date  . Myocardial infarction 04/12/1990  . CHF (congestive heart failure) 01/2012  . Hypertension   . Diabetes mellitus   . ICD (implantable cardiac defibrillator) in place   . Pacemaker 02/15/2012    [employer]  . Pneumonia 2011  . Cancer     Bladder Cancer- 2004 .  SkinCancer- Basal, Squamous, and a few melymona  . Arthritis   . History of blood transfusion   . Chronic kidney disease     Glomerulonephritis    Past Surgical History  Procedure Date  . Kidney transplant 02/13/1973  . Coronary angioplasty 03/1990    . Skin cancer excision     Numerous  . Eye surgery     Cataract Right Eye  . Insert / replace / remove pacemaker   . Uretheral transplation     Left to Right (transplanted kidney)  . Skin transplant     Left thigh to Left hand     No Known Allergies  Medications:  Scheduled:   . cloNIDine  150 mcg Epidural To OR  . dextrose  25 mL Intravenous Once  . dextrose      . mupirocin ointment   Nasal BID     Social History:  reports that he quit smoking about 29 years ago. He does not have any smokeless tobacco history on file. He reports that he does not drink alcohol or use illicit drugs.  History reviewed. No pertinent family history.  General ROS: normal BM, normal urination, states he has normal kidney function, no visual problems, denies paresthesias, FSG usually runs 90-110, having hypoglycemia in PACU, see HPI  Blood pressure 140/73, pulse 68, temperature 97.4 F (36.3 C), temperature source Oral, resp. rate 16, SpO2 100.00%. General appearance: alert, cooperative and no distress Eyes: negative findings: conjunctivae and sclerae normal and pupils equal, round, reactive to light and accomodation Throat: normal findings: oropharynx pink & moist without lesions or evidence of thrush Neck: no adenopathy Lungs: clear to auscultation bilaterally Heart: regular rate and  rhythm Abdomen: normal findings: bowel sounds normal and soft, non-tender Extremities: edema none and LLE wrapped. drain in place.  Skin: multiple actinic keratosis, some cracking of skin.  Normal light touch bilateral LE   Results for orders placed during the hospital encounter of 07/25/12 (from the past 48 hour(s))  SURGICAL PCR SCREEN     Status: Abnormal   Collection Time   07/24/12  1:52 PM      Component Value Range Comment   MRSA, PCR NEGATIVE  NEGATIVE    Staphylococcus aureus POSITIVE (*) NEGATIVE   C-REACTIVE PROTEIN     Status: Abnormal   Collection Time   07/24/12  2:00 PM      Component Value  Range Comment   CRP 7.1 (*) <0.60 mg/dL   SEDIMENTATION RATE     Status: Abnormal   Collection Time   07/24/12  2:00 PM      Component Value Range Comment   Sed Rate 56 (*) 0 - 16 mm/hr   BASIC METABOLIC PANEL     Status: Abnormal   Collection Time   07/24/12  2:00 PM      Component Value Range Comment   Sodium 137  135 - 145 mEq/L    Potassium 4.3  3.5 - 5.1 mEq/L    Chloride 101  96 - 112 mEq/L    CO2 26  19 - 32 mEq/L    Glucose, Bld 95  70 - 99 mg/dL    BUN 34 (*) 6 - 23 mg/dL    Creatinine, Ser 7.82 (*) 0.50 - 1.35 mg/dL    Calcium 8.9  8.4 - 95.6 mg/dL    GFR calc non Af Amer 38 (*) >90 mL/min    GFR calc Af Amer 44 (*) >90 mL/min   CBC WITH DIFFERENTIAL     Status: Abnormal   Collection Time   07/24/12  2:00 PM      Component Value Range Comment   WBC 8.9  4.0 - 10.5 K/uL    RBC 3.06 (*) 4.22 - 5.81 MIL/uL    Hemoglobin 8.4 (*) 13.0 - 17.0 g/dL    HCT 21.3 (*) 08.6 - 52.0 %    MCV 88.9  78.0 - 100.0 fL    MCH 27.5  26.0 - 34.0 pg    MCHC 30.9  30.0 - 36.0 g/dL    RDW 57.8 (*) 46.9 - 15.5 %    Platelets 167  150 - 400 K/uL    Neutrophils Relative 87 (*) 43 - 77 %    Neutro Abs 7.7  1.7 - 7.7 K/uL    Lymphocytes Relative 4 (*) 12 - 46 %    Lymphs Abs 0.3 (*) 0.7 - 4.0 K/uL    Monocytes Relative 10  3 - 12 %    Monocytes Absolute 0.9  0.1 - 1.0 K/uL    Eosinophils Relative 0  0 - 5 %    Eosinophils Absolute 0.0  0.0 - 0.7 K/uL    Basophils Relative 0  0 - 1 %    Basophils Absolute 0.0  0.0 - 0.1 K/uL   GLUCOSE, CAPILLARY     Status: Abnormal   Collection Time   07/25/12 12:58 PM      Component Value Range Comment   Glucose-Capillary 40 (*) 70 - 99 mg/dL    Comment 1 Notify RN     GLUCOSE, CAPILLARY     Status: Normal   Collection Time   07/25/12  2:05 PM  Component Value Range Comment   Glucose-Capillary 93  70 - 99 mg/dL    Comment 1 Notify RN     GLUCOSE, CAPILLARY     Status: Abnormal   Collection Time   07/25/12  3:04 PM      Component Value Range Comment    Glucose-Capillary 59 (*) 70 - 99 mg/dL   GLUCOSE, CAPILLARY     Status: Abnormal   Collection Time   07/25/12  5:28 PM      Component Value Range Comment   Glucose-Capillary 63 (*) 70 - 99 mg/dL    Comment 1 Notify RN      No results found for this basename: sdes, specrequest, cult, reptstatus   No results found. Recent Results (from the past 240 hour(s))  SURGICAL PCR SCREEN     Status: Abnormal   Collection Time   07/24/12  1:52 PM      Component Value Range Status Comment   MRSA, PCR NEGATIVE  NEGATIVE Final    Staphylococcus aureus POSITIVE (*) NEGATIVE Final       07/25/2012, 6:01 PM     LOS: 0 days

## 2012-07-25 NOTE — Progress Notes (Signed)
To be unclamped at 9 pm

## 2012-07-25 NOTE — Progress Notes (Signed)
Dr. Ninetta Lights visited  And talked with patient
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
## 2012-07-25 NOTE — Interval H&P Note (Signed)
History and Physical Interval Note:  07/25/2012 3:56 PM  [name]  has presented today for surgery, with the diagnosis of Left knee effusion, possible infection  The various methods of treatment have been discussed with the patient and family. After consideration of risks, benefits and other options for treatment, the patient has consented to  Procedure(s) (LRB) with comments: ARTHROSCOPY KNEE (Left) - Left knee arthroscopy, debridement, cultures as a surgical intervention .  The patient's history has been reviewed, patient examined, no change in status, stable for surgery.  I have reviewed the patient's chart and labs.  Questions were answered to the patient's satisfaction.     Evelina Lore SCOTT  Pseudomonas knee infxn - plan id and ID consult

## 2012-07-25 NOTE — Progress Notes (Signed)
Dr Michelle Piper notified re. cbg of 40. Order received amp of 25 D50 given iv stat. Pt tol well.Will recheck in 30 mins.

## 2012-07-25 NOTE — Progress Notes (Signed)
Did not receive icd form from Dr. Dulce Sellar comlpeted.  Michele rep. For [employer] coming to check on icd.

## 2012-07-25 NOTE — Anesthesia Preprocedure Evaluation (Addendum)
Anesthesia Evaluation  Patient identified by MRN, date of birth, ID band Patient awake    Reviewed: Allergy & Precautions, H&P , NPO status , Patient's Chart, lab work & pertinent test results  Airway Mallampati: II TM Distance: >3 FB Neck ROM: Full    Dental   Pulmonary  breath sounds clear to auscultation        Cardiovascular hypertension, + Past MI + pacemaker + Cardiac Defibrillator Rhythm:Regular Rate:Normal  H/o CHF 01/2012   Neuro/Psych    GI/Hepatic   Endo/Other  diabetes  Renal/GU Renal diseaseS/p transplant     Musculoskeletal   Abdominal   Peds  Hematology   Anesthesia Other Findings   Reproductive/Obstetrics                          Anesthesia Physical Anesthesia Plan  ASA: III  Anesthesia Plan: General   Post-op Pain Management:    Induction: Intravenous  Airway Management Planned: LMA  Additional Equipment:   Intra-op Plan:   Post-operative Plan: Extubation in OR  Informed Consent: I have reviewed the patients History and Physical, chart, labs and discussed the procedure including the risks, benefits and alternatives for the proposed anesthesia with the patient or authorized representative who has indicated his/her understanding and acceptance.     Plan Discussed with: CRNA and Surgeon  Anesthesia Plan Comments:         Anesthesia Quick Evaluation

## 2012-07-26 LAB — COMPREHENSIVE METABOLIC PANEL
ALT: 22 U/L (ref 0–53)
AST: 15 U/L (ref 0–37)
Albumin: 2.2 g/dL — ABNORMAL LOW (ref 3.5–5.2)
Alkaline Phosphatase: 59 U/L (ref 39–117)
Chloride: 103 mEq/L (ref 96–112)
Potassium: 4.2 mEq/L (ref 3.5–5.1)
Sodium: 137 mEq/L (ref 135–145)
Total Bilirubin: 0.2 mg/dL — ABNORMAL LOW (ref 0.3–1.2)
Total Protein: 5.3 g/dL — ABNORMAL LOW (ref 6.0–8.3)

## 2012-07-26 LAB — CBC WITH DIFFERENTIAL/PLATELET
Basophils Relative: 0 % (ref 0–1)
Eosinophils Absolute: 0.1 10*3/uL (ref 0.0–0.7)
Hemoglobin: 7.6 g/dL — ABNORMAL LOW (ref 13.0–17.0)
MCH: 26.9 pg (ref 26.0–34.0)
MCHC: 30.2 g/dL (ref 30.0–36.0)
Neutro Abs: 6.7 10*3/uL (ref 1.7–7.7)
Neutrophils Relative %: 77 % (ref 43–77)
Platelets: 189 10*3/uL (ref 150–400)
RBC: 2.83 MIL/uL — ABNORMAL LOW (ref 4.22–5.81)

## 2012-07-26 LAB — GLUCOSE, CAPILLARY

## 2012-07-26 MED ORDER — ASPIRIN EC 81 MG PO TBEC
81.0000 mg | DELAYED_RELEASE_TABLET | Freq: Every day | ORAL | Status: DC
  Administered 2012-07-26 – 2012-07-27 (×2): 81 mg via ORAL
  Filled 2012-07-26 (×2): qty 1

## 2012-07-26 MED ORDER — OXYCODONE HCL 10 MG PO TABS: 10.0000 mg | ORAL_TABLET | ORAL | Status: AC | PRN

## 2012-07-26 MED ORDER — DEXTROSE 5 % IV SOLN: 2.0000 g | INTRAVENOUS | Status: AC

## 2012-07-26 MED ORDER — FAMOTIDINE 20 MG PO TABS
20.0000 mg | ORAL_TABLET | Freq: Every day | ORAL | Status: DC
  Administered 2012-07-26 – 2012-07-27 (×2): 20 mg via ORAL
  Filled 2012-07-26 (×2): qty 1

## 2012-07-26 MED ORDER — ADULT MULTIVITAMIN W/MINERALS CH
1.0000 | ORAL_TABLET | Freq: Every day | ORAL | Status: DC
  Administered 2012-07-26 – 2012-07-27 (×2): 1 via ORAL
  Filled 2012-07-26 (×2): qty 1

## 2012-07-26 MED ORDER — INSULIN ASPART 100 UNIT/ML ~~LOC~~ SOLN
6.0000 [IU] | Freq: Three times a day (TID) | SUBCUTANEOUS | Status: DC
  Administered 2012-07-27 (×2): 6 [IU] via SUBCUTANEOUS

## 2012-07-26 MED ORDER — PREDNISONE 5 MG PO TABS
5.0000 mg | ORAL_TABLET | Freq: Two times a day (BID) | ORAL | Status: DC
  Administered 2012-07-26 – 2012-07-27 (×3): 5 mg via ORAL
  Filled 2012-07-26 (×4): qty 1

## 2012-07-26 MED ORDER — MYCOPHENOLATE MOFETIL 500 MG PO TABS: 500.0000 mg | ORAL_TABLET | Freq: Two times a day (BID) | ORAL | Status: DC

## 2012-07-26 MED ORDER — CARVEDILOL 12.5 MG PO TABS
12.5000 mg | ORAL_TABLET | Freq: Two times a day (BID) | ORAL | Status: DC
  Administered 2012-07-26 – 2012-07-27 (×2): 12.5 mg via ORAL
  Filled 2012-07-26 (×4): qty 1

## 2012-07-26 MED ORDER — NITROGLYCERIN 0.4 MG SL SUBL: 0.4000 mg | SUBLINGUAL_TABLET | SUBLINGUAL | Status: DC | PRN

## 2012-07-26 MED ORDER — TOBRAMYCIN 0.3 % OP SOLN
1.0000 [drp] | Freq: Three times a day (TID) | OPHTHALMIC | Status: DC | PRN
  Filled 2012-07-26: qty 5

## 2012-07-26 MED ORDER — MYCOPHENOLATE MOFETIL 250 MG PO CAPS
500.0000 mg | ORAL_CAPSULE | Freq: Two times a day (BID) | ORAL | Status: DC
  Administered 2012-07-26 – 2012-07-27 (×3): 500 mg via ORAL
  Filled 2012-07-26 (×4): qty 2

## 2012-07-26 MED ORDER — VITAMIN C 500 MG PO TABS
500.0000 mg | ORAL_TABLET | Freq: Every day | ORAL | Status: DC
  Administered 2012-07-26 – 2012-07-27 (×2): 500 mg via ORAL
  Filled 2012-07-26 (×2): qty 1

## 2012-07-26 MED ORDER — ALPRAZOLAM 0.25 MG PO TABS: 0.2500 mg | ORAL_TABLET | Freq: Every evening | ORAL | Status: DC

## 2012-07-26 MED ORDER — INSULIN DETEMIR 100 UNIT/ML ~~LOC~~ SOLN
50.0000 [IU] | Freq: Every day | SUBCUTANEOUS | Status: DC
  Filled 2012-07-26 (×2): qty 10

## 2012-07-26 MED ORDER — ATORVASTATIN CALCIUM 40 MG PO TABS
40.0000 mg | ORAL_TABLET | Freq: Every day | ORAL | Status: DC
  Administered 2012-07-26: 40 mg via ORAL
  Filled 2012-07-26 (×2): qty 1

## 2012-07-26 NOTE — Progress Notes (Signed)
Rept Dr. August Saucer. Pt's hemoglobin this AM 7.6. No new orders at this time.

## 2012-07-26 NOTE — Progress Notes (Signed)
Subjective: Pt stable pain controlled   Objective: Vital signs in last 24 hours: Temp:  [97.4 F (36.3 C)-99.1 F (37.3 C)] 99.1 F (37.3 C) (09/11 0405) Pulse Rate:  [68-86] 86  (09/11 0405) Resp:  [16-28] 20  (09/11 0405) BP: (138-180)/(66-89) 171/85 mmHg (09/11 0405) SpO2:  [95 %-100 %] 100 % (09/11 0405) Weight:  [84.9 kg (187 lb 2.7 oz)] 84.9 kg (187 lb 2.7 oz) (09/10 2100)  Intake/Output from previous day: 09/10 0701 - 09/11 0700 In: 1115 [P.O.:1040] Out: 555 [Urine:550; Drains:5] Intake/Output this shift: Total I/O In: 720 [P.O.:720] Out: 555 [Urine:550; Drains:5]  Exam:  Neurovascular intact Sensation intact distally Intact pulses distally  Labs:  Basename 07/24/12 1400  HGB 8.4*    Basename 07/24/12 1400  WBC 8.9  RBC 3.06*  HCT 27.2*  PLT 167    Basename 07/24/12 1400  NA 137  K 4.3  CL 101  CO2 26  BUN 34*  CREATININE 1.83*  GLUCOSE 95  CALCIUM 8.9   No results found for this basename: LABPT:2,INR:2 in the last 72 hours  Assessment/Plan: pic line - hl iv - PT consult-  Change dressing - dc home today with IV abx   Camden Knotek SCOTT 07/26/2012, 6:58 AM

## 2012-07-26 NOTE — Op Note (Signed)
NAME:  [name], [name] NO.:  [id number]  MEDICAL RECORD NO.:  [id number]  LOCATION:  [id number]                        FACILITY:  [facility]  PHYSICIAN:  Burnard Bunting, M.D.    DATE OF BIRTH:  [birth year]  DATE OF PROCEDURE:  07/25/2012 DATE OF DISCHARGE:                              OPERATIVE REPORT   PREOPERATIVE DIAGNOSIS:  Left knee infection.  POSTOPERATIVE DIAGNOSIS:  Left knee infection.  PROCEDURE:  Left knee I and D arthroscopic with synovectomy, anterior compartment.  SURGEON:  Burnard Bunting, MD.  ASSISTANT:  None.  ANESTHESIA:  General endotracheal.  ESTIMATED BLOOD LOSS:  Minimal.  CULTURES:  X3 obtained.  INDICATION:  [name] is a patient with left knee infection presents now for operative management after explanation of risks and benefits.  PROCEDURE IN DETAIL:  The patient was brought to the operating room, where general endotracheal anesthesia was induced.  Preoperative IV antibiotics were not administered.  Time-out was called.  Left leg was prepped with Hibiclens and saline, draped in a sterile manner.  Anterior- inferior lateral portal was established.  Anterior-inferior medial portal was established under direct visualization.  Diagnostic arthroscopy was performed.  Synovitis was present.  Mild meniscal fraying was present but in general the joint spaces look good.  ACL and PCL were intact.  Synovectomy and debridement was performed.  A 12 L of irrigating solution were used.  Accessory superior lateral portal was created.  Following extensive synovectomy and debridement and irrigation, instruments were removed.  Hemovac drain was placed. Portals were closed using 3-0 nylon.  Solution of Marcaine, morphine, and clonidine were injected to the knee.  Bulky dressing was applied. The patient tolerated the procedure well, was transferred to recovery in stable condition.    Burnard Bunting, M.D.    GSD/MEDQ  D:  07/25/2012  T:  07/26/2012   Job:  [id number]

## 2012-07-26 NOTE — Progress Notes (Signed)
INFECTIOUS DISEASE PROGRESS NOTE  ID: [name] is a 66 y.o. male with   Active Problems:  * No active hospital problems. *   Subjective: Some pain in knee. ? About anbx, pic line. Wondering when he will start PT  Abtx:  Anti-infectives     Start     Dose/Rate Route Frequency Ordered Stop   07/26/12 0000   dextrose 5 % SOLN 50 mL with ceFEPIme 2 G SOLR 2 g        2 g 100 mL/hr over 30 Minutes Intravenous 1 Day/Dose 07/26/12 0710 09/09/12 2359   07/25/12 2200   ceFEPIme (MAXIPIME) 2 g in dextrose 5 % 50 mL IVPB        2 g 100 mL/hr over 30 Minutes Intravenous Every 24 hours 07/25/12 1817            Medications:  Scheduled:   . ceFEPime (MAXIPIME) IV  2 g Intravenous Q24H  . dextrose      . HYDROmorphone      . HYDROmorphone      . DISCONTD: cloNIDine  150 mcg Epidural To OR  . DISCONTD: dextrose  25 mL Intravenous Once  . DISCONTD: mupirocin ointment   Nasal BID    Objective: Vital signs in last 24 hours: Temp:  [97.4 F (36.3 C)-99.1 F (37.3 C)] 99.1 F (37.3 C) (09/11 0405) Pulse Rate:  [68-86] 86  (09/11 0405) Resp:  [16-28] 20  (09/11 0405) BP: (138-180)/(66-89) 171/85 mmHg (09/11 0405) SpO2:  [95 %-100 %] 100 % (09/11 0405) Weight:  [84.9 kg (187 lb 2.7 oz)] 84.9 kg (187 lb 2.7 oz) (09/10 2100)   General appearance: alert, cooperative and no distress Extremities: LLE wrapped, thigh to foot.   Lab Results  Basename 07/26/12 0615 07/24/12 1400  WBC 8.7 8.9  HGB 7.6* 8.4*  HCT 25.2* 27.2*  NA 137 137  K 4.2 4.3  CL 103 101  CO2 22 26  BUN 33* 34*  CREATININE 1.68* 1.83*  GLU -- --   Liver Panel  Basename 07/26/12 0615  PROT 5.3*  ALBUMIN 2.2*  AST 15  ALT 22  ALKPHOS 59  BILITOT 0.2*  BILIDIR --  IBILI --   Sedimentation Rate  Basename 07/26/12 0615  ESRSEDRATE 50*   C-Reactive Protein  Basename 07/25/12 1910 07/24/12 1400  CRP 7.1* 7.1*    Microbiology: Recent Results (from the past 240 hour(s))  SURGICAL PCR  SCREEN     Status: Abnormal   Collection Time   07/24/12  1:52 PM      Component Value Range Status Comment   MRSA, PCR NEGATIVE  NEGATIVE Final    Staphylococcus aureus POSITIVE (*) NEGATIVE Final   BODY FLUID CULTURE     Status: Normal (Preliminary result)   Collection Time   07/25/12  5:03 PM      Component Value Range Status Comment   Specimen Description SYNOVIAL FLUID KNEE LEFT   Final    Special Requests RECEIVED SWAB   Final    Gram Stain     Final    Value: ABUNDANT WBC PRESENT, PREDOMINANTLY PMN     NO ORGANISMS SEEN   Culture PENDING   Incomplete    Report Status PENDING   Incomplete   TISSUE CULTURE     Status: Normal (Preliminary result)   Collection Time   07/25/12  5:03 PM      Component Value Range Status Comment   Specimen Description TISSUE KNEE  LEFT   Final    Special Requests NONE   Final    Gram Stain     Final    Value: NO WBC SEEN     NO ORGANISMS SEEN   Culture NO GROWTH   Final    Report Status PENDING   Incomplete     Studies/Results: No results found.   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  Would plan for 6 weeks of anbx Have him return to ID clinic in 4 weeks.  Explained to pt and wife his anbx, his PIC (low risk of infxn but not zero).  He will need to f/u with his renal MD as well.   Total days of antibiotics 2/42 -cefepime   [name] Infectious Diseases [phone] 07/26/2012, 8:58 AM   LOS: 1 day

## 2012-07-26 NOTE — Progress Notes (Signed)
CARE MANAGEMENT NOTE 07/26/2012  Patient:  [name], [name]   Account Number:  [id number]  Date Initiated:  07/26/2012  Documentation initiated by:  Vance Peper  Subjective/Objective Assessment:   66 yr old male s/p I & D of left knee.     Action/Plan:   CM spoke with patient and wife regarding HH needs at discharge.  Pateint will be on IV maxipeme for 6 weeks. PICC line to be inserted today.Choice offered.   Anticipated DC Date:  07/26/2012   Anticipated DC Plan:  HOME W HOME HEALTH SERVICES      DC Planning Services  CM consult      [facility] Choice  HOME HEALTH   Choice offered to / List presented to:  C-1 Patient        HH arranged  HH-1 RN      [facility] agency  [facility]   Status of service:  Completed, signed off Medicare Important Message given?   (If response is "NO", the following Medicare IM given date fields will be blank) Date Medicare IM given:   Date Additional Medicare IM given:    Discharge Disposition:  HOME W HOME HEALTH SERVICES  Per UR Regulation:    If discussed at Long Length of Stay Meetings, dates discussed:    Comments:

## 2012-07-26 NOTE — Progress Notes (Signed)
Utilization review completed. Yarah Fuente, RN, BSN. 

## 2012-07-26 NOTE — Evaluation (Signed)
Physical Therapy Evaluation Patient Details Name: [name] MRN: [id number] DOB: 06-12-1946 Today's Date: 07/26/2012 Time: 7829-5621 PT Time Calculation (min): 45 min  PT Assessment / Plan / Recommendation Clinical Impression  Pt is a 66 y/o male s/p I&D of L knee. Pt's mobility is limited by pain. Pt unable to tolerate WB on L LE. Pt would benefit from comtinued PT in acute setting to maximize pt safety and independence with mobility    PT Assessment  Patient needs continued PT services    Follow Up Recommendations  Home health PT;Supervision/Assistance - 24 hour    Barriers to Discharge None      Equipment Recommendations  Rolling walker with 5" wheels    Recommendations for Other Services     Frequency Min 5X/week    Precautions / Restrictions Precautions Precautions: Fall Restrictions Weight Bearing Restrictions: Yes LLE Weight Bearing: Weight bearing as tolerated Other Position/Activity Restrictions: Full ROM in L KNee   Pertinent Vitals/Pain 9/10 pain in L knee. Pt medicated approximately 1 hour prior to PT.  RN notified of pt's pain.       Mobility  Bed Mobility Bed Mobility: Supine to Sit;Sit to Supine Supine to Sit: 3: Mod assist Details for Bed Mobility Assistance: Assist for L LE. Transfers Transfers: Sit to Stand;Stand to [employer] Transfers Sit to Stand: 4: Min assist Stand to Sit: 4: Min assist Stand Pivot Transfers: 4: Min assist Details for Transfer Assistance: Assist to initiate standing and control descent to chair. Ambulation/Gait Ambulation/Gait Assistance: 4: Min assist Ambulation Distance (Feet): 5 Feet Assistive device: Rolling walker Ambulation/Gait Assistance Details: Step by step cueing for sequencing and assist to position RW. Pt not able to tolerate WB on L LE to advance RLE without max use of UEs and assist from PT Gait Pattern: Step-to pattern;Decreased step length - right;Decreased stance time - right;Decreased hip/knee  flexion - right;Decreased hip/knee flexion - left Stairs: Yes Stairs Assistance: 3: Mod assist Stairs Assistance Details (indicate cue type and reason): Step by step instruction for proper sequencing. Assist to position and steady walker. Pt having difficulty advancing R LE secondary to pain in L LE.  Stair Management Technique: No rails;With walker;Step to pattern;Forwards Wheelchair Mobility Wheelchair Mobility: No    Exercises Total Joint Exercises Heel Slides: Left;5 reps;Supine   PT Diagnosis: Difficulty walking;Generalized weakness;Acute pain  PT Problem List: Decreased strength;Decreased range of motion;Decreased activity tolerance;Decreased mobility;Pain PT Treatment Interventions: Gait training;DME instruction;Stair training;Functional mobility training;Therapeutic activities;Therapeutic exercise;Patient/family education   PT Goals Acute Rehab PT Goals PT Goal Formulation: With patient Time For Goal Achievement: 08/02/12 Potential to Achieve Goals: Good Pt will go Supine/Side to Sit: with supervision PT Goal: Supine/Side to Sit - Progress: Goal set today Pt will go Sit to Supine/Side: with supervision PT Goal: Sit to Supine/Side - Progress: Goal set today Pt will go Sit to Stand: with supervision PT Goal: Sit to Stand - Progress: Goal set today Pt will go Stand to Sit: with supervision PT Goal: Stand to Sit - Progress: Goal set today Pt will Transfer Bed to Chair/Chair to Bed: with supervision PT Transfer Goal: Bed to Chair/Chair to Bed - Progress: Goal set today Pt will Ambulate: 51 - 150 feet;with supervision;with least restrictive assistive device PT Goal: Ambulate - Progress: Goal set today Pt will Go Up / Down Stairs: 6-9 stairs;with supervision;with least restrictive assistive device PT Goal: Up/Down Stairs - Progress: Goal set today  Visit Information  Last PT Received On: 07/26/12    Subjective  Data  Subjective: agree to PT eval Patient Stated Goal: Be able to  walk   Prior Functioning  Home Living Lives With: Spouse Available Help at Discharge: Family;Available 24 hours/day Type of Home: House Home Access: Stairs to enter [employer] of Steps: 6 Entrance Stairs-Rails: Right Home Layout: Two level;Able to live on main level with bedroom/bathroom Prior Function Level of Independence: Independent Able to Take Stairs?: Yes Driving: Yes Vocation: Full time employment    Cognition  Overall Cognitive Status: Appears within functional limits for tasks assessed/performed Arousal/Alertness: Awake/alert Orientation Level: Oriented X4 / Intact Behavior During Session: [facility] for tasks performed    Extremity/Trunk Assessment Right Upper Extremity Assessment RUE ROM/Strength/Tone: Within functional levels Left Upper Extremity Assessment LUE ROM/Strength/Tone: Within functional levels Right Lower Extremity Assessment RLE ROM/Strength/Tone: Within functional levels Left Lower Extremity Assessment LLE ROM/Strength/Tone: Within functional levels   Balance Balance Balance Assessed: No  End of Session PT - End of Session Equipment Utilized During Treatment: Gait belt Activity Tolerance: Patient limited by pain Patient left: in chair;with call bell/phone within reach;with family/visitor present Nurse Communication: Mobility status  GP Functional Assessment Tool Used: Clinical judgement Functional Limitation: Mobility: Walking and moving around Mobility: Walking and Moving Around Current Status ([id number]): At least 20 percent but less than 40 percent impaired, limited or restricted Mobility: Walking and Moving Around Goal Status [phone]): At least 1 percent but less than 20 percent impaired, limited or restricted   Kemyra August 07/26/2012, 2:57 PM  Abdallah Hern L. Layloni Fahrner DPT [phone]

## 2012-07-26 NOTE — Progress Notes (Signed)
Pt is S/P I and D of infected L knee. Pt has a dry dressing to l knee. Pt has a dry coban dressing to L hand. Pt states, "that is where I had a skin graft from my L thigh for skin cancer". Pt has dry scabbed and crusty areas all over his body. Pt has a dry bandaid to posterior need. No pressure skin issues noted of R heel or sacral area. MD in this AM to d/c drain. Per his order, will change the dressing this PM.

## 2012-07-27 ENCOUNTER — Encounter (HOSPITAL_COMMUNITY): Payer: Self-pay | Admitting: Orthopedic Surgery

## 2012-07-27 ENCOUNTER — Ambulatory Visit (HOSPITAL_COMMUNITY): Payer: PRIVATE HEALTH INSURANCE

## 2012-07-27 LAB — GLUCOSE, CAPILLARY
Glucose-Capillary: 173 mg/dL — ABNORMAL HIGH (ref 70–99)
Glucose-Capillary: 166 mg/dL — ABNORMAL HIGH (ref 70–99)

## 2012-07-27 MED ORDER — HEPARIN SOD (PORK) LOCK FLUSH 100 UNIT/ML IV SOLN
500.0000 [IU] | Freq: Once | INTRAVENOUS | Status: AC
  Administered 2012-07-27: 500 [IU] via INTRAVENOUS

## 2012-07-27 MED ORDER — SODIUM CHLORIDE 0.9 % IJ SOLN
10.0000 mL | INTRAMUSCULAR | Status: DC | PRN
  Administered 2012-07-27: 10 mL

## 2012-07-27 MED ORDER — DEXTROSE 5 % IV SOLN
2.0000 g | INTRAVENOUS | Status: DC
  Administered 2012-07-27: 2 g via INTRAVENOUS
  Filled 2012-07-27: qty 2

## 2012-07-27 NOTE — Progress Notes (Signed)
Subjective: Pt stable - better with PT today   Objective: Vital signs in last 24 hours: Temp:  [98.8 F (37.1 C)-99.4 F (37.4 C)] 98.8 F (37.1 C) (09/12 0650) Pulse Rate:  [77-95] 77  (09/12 0650) Resp:  [17-18] 18  (09/12 0650) BP: (134-170)/(66-82) 134/66 mmHg (09/12 0650) SpO2:  [96 %-98 %] 96 % (09/12 0650)  Intake/Output from previous day: 09/11 0701 - 09/12 0700 In: 540 [P.O.:540] Out: 1250 [Urine:1250] Intake/Output this shift:    Exam:  Neurovascular intact Sensation intact distally Intact pulses distally  Labs:  Basename 07/26/12 0615 07/24/12 1400  HGB 7.6* 8.4*    Basename 07/26/12 0615 07/24/12 1400  WBC 8.7 8.9  RBC 2.83* 3.06*  HCT 25.2* 27.2*  PLT 189 167    Basename 07/26/12 0615 07/24/12 1400  NA 137 137  K 4.2 4.3  CL 103 101  CO2 22 26  BUN 33* 34*  CREATININE 1.68* 1.83*  GLUCOSE 108* 95  CALCIUM 8.9 8.9   No results found for this basename: LABPT:2,INR:2 in the last 72 hours  Assessment/Plan: Dc home after PIC line   Abbigaile Rockman SCOTT 07/27/2012, 11:47 AM

## 2012-07-27 NOTE — Progress Notes (Signed)
INFECTIOUS DISEASE PROGRESS NOTE  ID: [name] is a 66 y.o. male with  Active Problems:  * No active hospital problems. *   Subjective: Without complaints Some pain (knee) with standing  Abtx:  Anti-infectives     Start     Dose/Rate Route Frequency Ordered Stop   07/27/12 1400   ceFEPIme (MAXIPIME) 2 g in dextrose 5 % 50 mL IVPB        2 g 100 mL/hr over 30 Minutes Intravenous Every 24 hours 07/27/12 1341     07/26/12 0000   dextrose 5 % SOLN 50 mL with ceFEPIme 2 G SOLR 2 g        2 g 100 mL/hr over 30 Minutes Intravenous 1 Day/Dose 07/26/12 0710 09/09/12 2359   07/25/12 2200   ceFEPIme (MAXIPIME) 2 g in dextrose 5 % 50 mL IVPB  Status:  Discontinued        2 g 100 mL/hr over 30 Minutes Intravenous Every 24 hours 07/25/12 1817 07/27/12 1341          Medications:  Scheduled:   . ALPRAZolam  0.25 mg Oral QPM  . aspirin EC  81 mg Oral Daily  . atorvastatin  40 mg Oral q1800  . carvedilol  12.5 mg Oral BID WC  . ceFEPime (MAXIPIME) IV  2 g Intravenous Q24H  . famotidine  20 mg Oral Daily  . insulin aspart  6 Units Subcutaneous TID AC  . insulin detemir  50 Units Subcutaneous QHS  . multivitamin with minerals  1 tablet Oral Daily  . mycophenolate  500 mg Oral BID  . predniSONE  5 mg Oral BID  . vitamin C  500 mg Oral Daily  . DISCONTD: ceFEPime (MAXIPIME) IV  2 g Intravenous Q24H    Objective: Vital signs in last 24 hours: Temp:  [98.2 F (36.8 C)-99.4 F (37.4 C)] 98.2 F (36.8 C) (09/12 1406) Pulse Rate:  [77-95] 87  (09/12 1406) Resp:  [16-18] 16  (09/12 1406) BP: (134-157)/(66-72) 157/72 mmHg (09/12 1406) SpO2:  [96 %-100 %] 100 % (09/12 1406)   General appearance: alert, cooperative and no distress Extremities: mod edema LLE around knee. increase warmth also. PIC RUE  Lab Results  [facility] 07/26/12 0615  WBC 8.7  HGB 7.6*  HCT 25.2*  NA 137  K 4.2  CL 103  CO2 22  BUN 33*  CREATININE 1.68*  GLU --   Liver Panel  Basename  07/26/12 0615  PROT 5.3*  ALBUMIN 2.2*  AST 15  ALT 22  ALKPHOS 59  BILITOT 0.2*  BILIDIR --  IBILI --   Sedimentation Rate  Basename 07/26/12 0615  ESRSEDRATE 50*   C-Reactive Protein  Basename 07/25/12 1910  CRP 7.1*    Microbiology: Recent Results (from the past 240 hour(s))  SURGICAL PCR SCREEN     Status: Abnormal   Collection Time   07/24/12  1:52 PM      Component Value Range Status Comment   MRSA, PCR NEGATIVE  NEGATIVE Final    Staphylococcus aureus POSITIVE (*) NEGATIVE Final   BODY FLUID CULTURE     Status: Normal (Preliminary result)   Collection Time   07/25/12  5:03 PM      Component Value Range Status Comment   Specimen Description SYNOVIAL FLUID KNEE LEFT   Final    Special Requests RECEIVED SWAB   Final    Gram Stain     Final    Value: ABUNDANT  WBC PRESENT, PREDOMINANTLY PMN     NO ORGANISMS SEEN   Culture NO GROWTH 1 DAY   Final    Report Status PENDING   Incomplete   ANAEROBIC CULTURE     Status: Normal (Preliminary result)   Collection Time   07/25/12  5:03 PM      Component Value Range Status Comment   Specimen Description SYNOVIAL FLUID KNEE LEFT   Final    Special Requests RECEIVED SWAB   Final    Gram Stain PENDING   Incomplete    Culture     Final    Value: NO ANAEROBES ISOLATED; CULTURE IN PROGRESS FOR 5 DAYS   Report Status PENDING   Incomplete   TISSUE CULTURE     Status: Normal (Preliminary result)   Collection Time   07/25/12  5:03 PM      Component Value Range Status Comment   Specimen Description TISSUE KNEE LEFT   Final    Special Requests NONE   Final    Gram Stain     Final    Value: NO WBC SEEN     NO ORGANISMS SEEN   Culture NO GROWTH 1 DAY   Final    Report Status PENDING   Incomplete   ANAEROBIC CULTURE     Status: Normal (Preliminary result)   Collection Time   07/25/12  5:03 PM      Component Value Range Status Comment   Specimen Description TISSUE KNEE LEFT   Final    Special Requests NONE   Final    Gram Stain  PENDING   Incomplete    Culture     Final    Value: NO ANAEROBES ISOLATED; CULTURE IN PROGRESS FOR 5 DAYS   Report Status PENDING   Incomplete     Studies/Results: Dg Chest Port 1 View  07/27/2012  *RADIOLOGY REPORT*  Clinical Data: PICC line placement.  PORTABLE CHEST - 1 VIEW  Comparison: 04/28/2012  Findings: Right PICC line is in place with the tip in the SVC. Left pacer remains in place, unchanged.  Curvilinear calcification in the right side of the mediastinum, likely ascending aortic atherosclerotic calcifications as seen on prior CT.  Cardiomegaly. Lungs are clear.  No effusions or acute bony abnormality.  IMPRESSION: Cardiomegaly.  Right PICC line tip in the SVC.   Original Report Authenticated By: Cyndie Chime, M.D.      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  ID f/u on 10-8         Johny Sax Infectious Diseases [phone] 07/27/2012, 2:35 PM   LOS: 2 days

## 2012-07-27 NOTE — Progress Notes (Addendum)
Physical Therapy Treatment and Discharge.  Patient Details Name: [name] MRN: [id number] DOB: [birth year] Today's Date: 07/27/2012 Time: 7829-5621 PT Time Calculation (min): 30 min  PT Assessment / Plan / Recommendation Comments on Treatment Session  Pt much improved from yesterday and is now appropriate to d/c to home safely.      Follow Up Recommendations  Home health PT;Supervision/Assistance - 24 hour    Barriers to Discharge        Equipment Recommendations  None recommended by PT    Recommendations for Other Services    Frequency     Plan Discharge plan remains appropriate;All goals met and education completed, patient dischaged from PT services    Precautions / Restrictions Precautions Precautions: Fall Restrictions Weight Bearing Restrictions: Yes LLE Weight Bearing: Weight bearing as tolerated   Pertinent Vitals/Pain Pt reports pain 4/10 in L knee. No pain intervention required.     Mobility  Bed Mobility Bed Mobility: Supine to Sit;Sit to Supine Supine to Sit: 6: Modified independent (Device/Increase time) Sit to Supine: 6: Modified independent (Device/Increase time);HOB flat Details for Bed Mobility Assistance: Pt able to manage L.LE without assistance. Transfers Transfers: Sit to Stand;Stand to Sit;Stand Pivot Transfers Sit to Stand: 6: Modified independent (Device/Increase time);With upper extremity assist;From bed;From chair/3-in-1 Stand to Sit: 6: Modified independent (Device/Increase time);To bed;To chair/3-in-1;With upper extremity assist Stand Pivot Transfers: 6: Modified independent (Device/Increase time) Details for Transfer Assistance: Pt demonstrates safety and modified independence using RW to transfer.   Ambulation/Gait Ambulation/Gait Assistance: 6: Modified independent (Device/Increase time) Ambulation Distance (Feet): 80 Feet Assistive device: Rolling walker Ambulation/Gait Assistance Details: No assist or instruction required. Pt  tolerating weight bearing well  Gait Pattern: Step-to pattern Stairs: Yes Stairs Assistance: 5: Supervision Stairs Assistance Details (indicate cue type and reason): Supervision for safety. Pt using RW (sideways and L rail).   Stair Management Technique: One rail Left;Step to pattern;Forwards;With walker Number of Stairs: 4  Wheelchair Mobility Wheelchair Mobility: No    Exercises Total Joint Exercises Heel Slides: Left;5 reps;Supine Straight Leg Raises: Left;5 reps;Supine Knee Flexion: Left;Other reps (comment);Seated (10 second hold x 3reps)   PT Diagnosis:    PT Problem List:   PT Treatment Interventions:     PT Goals Acute Rehab PT Goals PT Goal Formulation: With patient Time For Goal Achievement: 08/02/12 Potential to Achieve Goals: Good Pt will go Supine/Side to Sit: with supervision PT Goal: Supine/Side to Sit - Progress: Met Pt will go Sit to Supine/Side: with supervision PT Goal: Sit to Supine/Side - Progress: Met Pt will go Sit to Stand: with supervision PT Goal: Sit to Stand - Progress: Met Pt will go Stand to Sit: with supervision PT Goal: Stand to Sit - Progress: Met Pt will Transfer Bed to Chair/Chair to Bed: with supervision PT Transfer Goal: Bed to Chair/Chair to Bed - Progress: Met Pt will Ambulate: 51 - 150 feet;with supervision;with least restrictive assistive device PT Goal: Ambulate - Progress: Met Pt will Go Up / Down Stairs: 6-9 stairs;with supervision;with least restrictive assistive device PT Goal: Up/Down Stairs - Progress: Met  Visit Information  Last PT Received On: 07/27/12    Subjective Data  Subjective: I did better getting out of bed today. No screaming Patient Stated Goal: D/c home today.    Cognition  Overall Cognitive Status: Appears within functional limits for tasks assessed/performed Arousal/Alertness: Awake/alert Orientation Level: Oriented X4 / Intact Behavior During Session: [facility] for tasks performed    Balance   Balance Balance Assessed: No  End of Session PT - End of Session Equipment Utilized During Treatment: Gait belt Activity Tolerance: Patient tolerated treatment well Patient left: in chair;with call bell/phone within reach;with family/visitor present Nurse Communication: Mobility status   GP     [name] 07/27/2012, 10:04 AM Theron Arista L. Lashonda Sonneborn DPT [phone]

## 2012-07-27 NOTE — Progress Notes (Signed)
Peripherally Inserted Central Catheter/Midline Placement  The IV Nurse has discussed with the patient and/or persons authorized to consent for the patient, the purpose of this procedure and the potential benefits and risks involved with this procedure.  The benefits include less needle sticks, lab draws from the catheter and patient may be discharged home with the catheter.  Risks include, but not limited to, infection, bleeding, blood clot (thrombus formation), and puncture of an artery; nerve damage and irregular heat beat.  Alternatives to this procedure were also discussed.  PICC/Midline Placement Documentation        Stacie Glaze Horton 07/27/2012, 12:48 PM

## 2012-07-29 LAB — TISSUE CULTURE
Culture: NO GROWTH
Gram Stain: NONE SEEN

## 2012-07-29 LAB — BODY FLUID CULTURE

## 2012-07-30 LAB — ANAEROBIC CULTURE

## 2012-07-30 NOTE — Discharge Summary (Signed)
Physician Discharge Summary  Patient ID: [name] MRN: [id number] DOB/AGE: 04/26/1946 66 y.o.  Admit date: 07/25/2012 Discharge date: 07/27/2012 Admission Diagnoses:  Pseudomonas knee infection left Discharge Diagnoses:  Same  Surgeries: Procedure(s): ARTHROSCOPY KNEE on 07/25/2012   Consultants:ID Discharged Condition: Stable  Hospital Course: LOC [name] is an 66 y.o. male who was admitted 07/25/2012 with a chief complaint of knee pain, and found to have a diagnosis of knee infection They were brought to the operating room on 07/25/2012 and underwent the above named procedures. He was placed on iv abx per ID and dced home pod 2 after improving mobilization with PT.   Antibiotics given:  Anti-infectives     Start     Dose/Rate Route Frequency Ordered Stop   07/27/12 1400   ceFEPIme (MAXIPIME) 2 g in dextrose 5 % 50 mL IVPB  Status:  Discontinued        2 g 100 mL/hr over 30 Minutes Intravenous Every 24 hours 07/27/12 1341 07/27/12 1842   07/26/12 0000   dextrose 5 % SOLN 50 mL with ceFEPIme 2 G SOLR 2 g        2 g 100 mL/hr over 30 Minutes Intravenous 1 Day/Dose 07/26/12 0710 09/09/12 2359   07/25/12 2200   ceFEPIme (MAXIPIME) 2 g in dextrose 5 % 50 mL IVPB  Status:  Discontinued        2 g 100 mL/hr over 30 Minutes Intravenous Every 24 hours 07/25/12 1817 07/27/12 1341        .  Recent vital signs:  Filed Vitals:   07/27/12 1406  BP: 157/72  Pulse: 87  Temp: 98.2 F (36.8 C)  Resp: 16    Recent laboratory studies:  Results for orders placed during the hospital encounter of 07/25/12  SURGICAL PCR SCREEN      Component Value Range   MRSA, PCR NEGATIVE  NEGATIVE   Staphylococcus aureus POSITIVE (*) NEGATIVE  C-REACTIVE PROTEIN      Component Value Range   CRP 7.1 (*) <0.60 mg/dL  SEDIMENTATION RATE      Component Value Range   Sed Rate 56 (*) 0 - 16 mm/hr  BASIC METABOLIC PANEL      Component Value Range   Sodium 137  135 - 145 mEq/L   Potassium 4.3   3.5 - 5.1 mEq/L   Chloride 101  96 - 112 mEq/L   CO2 26  19 - 32 mEq/L   Glucose, Bld 95  70 - 99 mg/dL   BUN 34 (*) 6 - 23 mg/dL   Creatinine, Ser 1.30 (*) 0.50 - 1.35 mg/dL   Calcium 8.9  8.4 - 86.5 mg/dL   GFR calc non Af Amer 38 (*) >90 mL/min   GFR calc Af Amer 44 (*) >90 mL/min  CBC WITH DIFFERENTIAL      Component Value Range   WBC 8.9  4.0 - 10.5 K/uL   RBC 3.06 (*) 4.22 - 5.81 MIL/uL   Hemoglobin 8.4 (*) 13.0 - 17.0 g/dL   HCT 78.4 (*) 69.6 - 29.5 %   MCV 88.9  78.0 - 100.0 fL   MCH 27.5  26.0 - 34.0 pg   MCHC 30.9  30.0 - 36.0 g/dL   RDW 28.4 (*) 13.2 - 44.0 %   Platelets 167  150 - 400 K/uL   Neutrophils Relative 87 (*) 43 - 77 %   Neutro Abs 7.7  1.7 - 7.7 K/uL   Lymphocytes Relative 4 (*) 12 -  46 %   Lymphs Abs 0.3 (*) 0.7 - 4.0 K/uL   Monocytes Relative 10  3 - 12 %   Monocytes Absolute 0.9  0.1 - 1.0 K/uL   Eosinophils Relative 0  0 - 5 %   Eosinophils Absolute 0.0  0.0 - 0.7 K/uL   Basophils Relative 0  0 - 1 %   Basophils Absolute 0.0  0.0 - 0.1 K/uL  GLUCOSE, CAPILLARY      Component Value Range   Glucose-Capillary 40 (*) 70 - 99 mg/dL   Comment 1 Notify RN    GLUCOSE, CAPILLARY      Component Value Range   Glucose-Capillary 93  70 - 99 mg/dL   Comment 1 Notify RN    GLUCOSE, CAPILLARY      Component Value Range   Glucose-Capillary 59 (*) 70 - 99 mg/dL  BODY FLUID CULTURE      Component Value Range   Specimen Description SYNOVIAL FLUID KNEE LEFT     Special Requests RECEIVED SWAB     Gram Stain       Value: ABUNDANT WBC PRESENT, PREDOMINANTLY PMN     NO ORGANISMS SEEN   Culture       Value: RARE PSEUDOMONAS AERUGINOSA     Note: CRITICAL RESULT CALLED TO, READ BACK BY AND VERIFIED WITH: WENDY @1110  07/28/12 BY KRAWS REPORT FAXED BY REQUEST   Report Status 07/29/2012 FINAL     Organism ID, Bacteria PSEUDOMONAS AERUGINOSA    ANAEROBIC CULTURE      Component Value Range   Specimen Description SYNOVIAL FLUID KNEE LEFT     Special Requests RECEIVED  SWAB     Gram Stain PENDING     Culture       Value: NO ANAEROBES ISOLATED; CULTURE IN PROGRESS FOR 5 DAYS   Report Status PENDING    TISSUE CULTURE      Component Value Range   Specimen Description TISSUE KNEE LEFT     Special Requests NONE     Gram Stain       Value: NO WBC SEEN     NO ORGANISMS SEEN   Culture NO GROWTH 3 DAYS     Report Status 07/29/2012 FINAL    ANAEROBIC CULTURE      Component Value Range   Specimen Description TISSUE KNEE LEFT     Special Requests NONE     Gram Stain PENDING     Culture       Value: NO ANAEROBES ISOLATED; CULTURE IN PROGRESS FOR 5 DAYS   Report Status PENDING    GLUCOSE, CAPILLARY      Component Value Range   Glucose-Capillary 63 (*) 70 - 99 mg/dL   Comment 1 Notify RN    GLUCOSE, CAPILLARY      Component Value Range   Glucose-Capillary 118 (*) 70 - 99 mg/dL  CBC WITH DIFFERENTIAL      Component Value Range   WBC 8.7  4.0 - 10.5 K/uL   RBC 2.83 (*) 4.22 - 5.81 MIL/uL   Hemoglobin 7.6 (*) 13.0 - 17.0 g/dL   HCT 14.7 (*) 82.9 - 56.2 %   MCV 89.0  78.0 - 100.0 fL   MCH 26.9  26.0 - 34.0 pg   MCHC 30.2  30.0 - 36.0 g/dL   RDW 13.0 (*) 86.5 - 78.4 %   Platelets 189  150 - 400 K/uL   Neutrophils Relative 77  43 - 77 %   Neutro Abs 6.7  1.7 - 7.7 K/uL   Lymphocytes Relative 7 (*) 12 - 46 %   Lymphs Abs 0.6 (*) 0.7 - 4.0 K/uL   Monocytes Relative 15 (*) 3 - 12 %   Monocytes Absolute 1.3 (*) 0.1 - 1.0 K/uL   Eosinophils Relative 1  0 - 5 %   Eosinophils Absolute 0.1  0.0 - 0.7 K/uL   Basophils Relative 0  0 - 1 %   Basophils Absolute 0.0  0.0 - 0.1 K/uL  COMPREHENSIVE METABOLIC PANEL      Component Value Range   Sodium 137  135 - 145 mEq/L   Potassium 4.2  3.5 - 5.1 mEq/L   Chloride 103  96 - 112 mEq/L   CO2 22  19 - 32 mEq/L   Glucose, Bld 108 (*) 70 - 99 mg/dL   BUN 33 (*) 6 - 23 mg/dL   Creatinine, Ser 1.61 (*) 0.50 - 1.35 mg/dL   Calcium 8.9  8.4 - 09.6 mg/dL   Total Protein 5.3 (*) 6.0 - 8.3 g/dL   Albumin 2.2 (*) 3.5 -  5.2 g/dL   AST 15  0 - 37 U/L   ALT 22  0 - 53 U/L   Alkaline Phosphatase 59  39 - 117 U/L   Total Bilirubin 0.2 (*) 0.3 - 1.2 mg/dL   GFR calc non Af Amer 42 (*) >90 mL/min   GFR calc Af Amer 48 (*) >90 mL/min  SEDIMENTATION RATE      Component Value Range   Sed Rate 50 (*) 0 - 16 mm/hr  C-REACTIVE PROTEIN      Component Value Range   CRP 7.1 (*) <0.60 mg/dL  GLUCOSE, CAPILLARY      Component Value Range   Glucose-Capillary 101 (*) 70 - 99 mg/dL  GLUCOSE, CAPILLARY      Component Value Range   Glucose-Capillary 110 (*) 70 - 99 mg/dL  GLUCOSE, CAPILLARY      Component Value Range   Glucose-Capillary 135 (*) 70 - 99 mg/dL   Comment 1 Documented in Chart     Comment 2 Notify RN    GLUCOSE, CAPILLARY      Component Value Range   Glucose-Capillary 118 (*) 70 - 99 mg/dL   Comment 1 Documented in Chart     Comment 2 Notify RN    GLUCOSE, CAPILLARY      Component Value Range   Glucose-Capillary 155 (*) 70 - 99 mg/dL  GLUCOSE, CAPILLARY      Component Value Range   Glucose-Capillary 173 (*) 70 - 99 mg/dL  GLUCOSE, CAPILLARY      Component Value Range   Glucose-Capillary 166 (*) 70 - 99 mg/dL   Comment 1 Documented in Chart     Comment 2 Notify RN      Discharge Medications:     Medication List     As of 07/30/2012  8:08 AM    STOP taking these medications         oxyCODONE-acetaminophen 5-325 MG per tablet   Commonly known as: PERCOCET/ROXICET      TAKE these medications         ALPRAZolam 0.25 MG tablet   Commonly known as: XANAX   Take 0.25 mg by mouth every evening.      aspirin EC 81 MG tablet   Take 81 mg by mouth daily.      carvedilol 12.5 MG tablet   Commonly known as: COREG   Take 12.5 mg by  mouth 2 (two) times daily with a meal.      dextrose 5 % SOLN 50 mL with ceFEPIme 2 G SOLR 2 g   Inject 2 g into the vein 1 day or 1 dose.      fish oil-omega-3 fatty acids 1000 MG capsule   Take 1 g by mouth daily.      insulin aspart 100 UNIT/ML injection    Commonly known as: novoLOG   Inject 6 Units into the skin 3 (three) times daily before meals.      insulin detemir 100 UNIT/ML injection   Commonly known as: LEVEMIR   Inject 50 Units into the skin at bedtime.      multivitamin with minerals Tabs   Take 1 tablet by mouth daily.      mycophenolate 500 MG tablet   Commonly known as: CELLCEPT   Take by mouth 2 (two) times daily.      nitroGLYCERIN 0.4 MG SL tablet   Commonly known as: NITROSTAT   Place 0.4 mg under the tongue every 5 (five) minutes as needed. For chest pain      Oxycodone HCl 10 MG Tabs   Take 1 tablet (10 mg total) by mouth every 3 (three) hours as needed.      predniSONE 5 MG tablet   Commonly known as: DELTASONE   Take 5 mg by mouth 2 (two) times daily.      ranitidine 300 MG tablet   Commonly known as: ZANTAC   Take 300 mg by mouth daily.      simvastatin 80 MG tablet   Commonly known as: ZOCOR   Take 80 mg by mouth at bedtime.      tobramycin 0.3 % ophthalmic solution   Commonly known as: TOBREX   Place 1 drop into both eyes 3 (three) times daily as needed. For eye irritation      vitamin C 500 MG tablet   Commonly known as: ASCORBIC ACID   Take 500 mg by mouth daily.        Diagnostic Studies: Dg Chest Port 1 View  07/27/2012  *RADIOLOGY REPORT*  Clinical Data: PICC line placement.  PORTABLE CHEST - 1 VIEW  Comparison: 04/28/2012  Findings: Right PICC line is in place with the tip in the SVC. Left pacer remains in place, unchanged.  Curvilinear calcification in the right side of the mediastinum, likely ascending aortic atherosclerotic calcifications as seen on prior CT.  Cardiomegaly. Lungs are clear.  No effusions or acute bony abnormality.  IMPRESSION: Cardiomegaly.  Right PICC line tip in the SVC.   Original Report Authenticated By: Cyndie Chime, M.D.     Disposition: 01-Home or Self Care      Discharge Orders    Future Appointments: Provider: Department: Dept Phone: Center:    08/22/2012 2:00 PM Ginnie Smart, MD [facility] [phone] [facility]   08/28/2012 8:00 AM [facility] [phone] [facility]     Future Orders Please Complete By Expires   Diet - low sodium heart healthy      Diet - low sodium heart healthy      Call MD / Call 911      Comments:   If you experience chest pain or shortness of breath, CALL 911 and be transported to the hospital emergency room.  If you develope a fever above 101 F, pus (white drainage) or increased drainage or redness at the wound, or calf pain, call your surgeon's  office.   Constipation Prevention      Comments:   Drink plenty of fluids.  Prune juice may be helpful.  You may use a stool softener, such as Colace (over the counter) 100 mg twice a day.  Use MiraLax (over the counter) for constipation as needed.   Increase activity slowly as tolerated      Discharge instructions      Comments:   Weight bearing a tolerated with crutches Keep incisions dry IV antibiotics for 6 weekst   Call MD / Call 911      Comments:   If you experience chest pain or shortness of breath, CALL 911 and be transported to the hospital emergency room.  If you develope a fever above 101 F, pus (white drainage) or increased drainage or redness at the wound, or calf pain, call your surgeon's office.   Constipation Prevention      Comments:   Drink plenty of fluids.  Prune juice may be helpful.  You may use a stool softener, such as Colace (over the counter) 100 mg twice a day.  Use MiraLax (over the counter) for constipation as needed.   Increase activity slowly as tolerated            Signed: Theoplis Garciagarcia SCOTT 07/30/2012, 8:08 AM

## 2012-08-22 ENCOUNTER — Inpatient Hospital Stay: Payer: PRIVATE HEALTH INSURANCE | Admitting: Infectious Diseases

## 2012-08-23 ENCOUNTER — Telehealth: Payer: Self-pay | Admitting: *Deleted

## 2012-08-23 ENCOUNTER — Ambulatory Visit (INDEPENDENT_AMBULATORY_CARE_PROVIDER_SITE_OTHER): Payer: PRIVATE HEALTH INSURANCE | Admitting: Infectious Diseases

## 2012-08-23 ENCOUNTER — Encounter: Payer: Self-pay | Admitting: Infectious Diseases

## 2012-08-23 VITALS — BP 161/78 | HR 80 | Temp 97.8°F | Wt 175.0 lb

## 2012-08-23 DIAGNOSIS — M009 Pyogenic arthritis, unspecified: Secondary | ICD-10-CM

## 2012-08-23 DIAGNOSIS — Z940 Kidney transplant status: Secondary | ICD-10-CM

## 2012-08-23 MED ORDER — LEVOFLOXACIN 250 MG PO TABS: 250.0000 mg | ORAL_TABLET | Freq: Every day | ORAL | Status: DC

## 2012-08-23 NOTE — Progress Notes (Signed)
  Subjective:    Patient ID: Burman Blacksmith, male    DOB: 09/14/1946, 66 y.o.   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 States he was adm to the hospital in March with CHF and since then has had recurrent effusions in his L knee. He states he has had the knee aspirated weekly since then. He denies fever or chills. Has simply had swelling. Denies trauma to his knee.  He had asiration of his knee on 9-5 which grew pseudomonas aeruginosa (pan-sensitive), 28,790 WBC. He was admitted 9-10 and underwent synovectomy. Cx in hospital was (-). He was sent home with cefepime via PIC on 9-12. CRP 7.1 and ESR 50.  Today is day 29 of therapy.  Has been doing ok with anbx. Moved over the w/e and missed his dressing change. Feels like his knee is doing better, less swelling. Walking better.     Review of Systems No thrush. See HPI.      NEEDS ESR, CRP. PIC dressing change.      Objective:   Physical Exam  Constitutional: He appears well-developed and well-nourished.  HENT:  Mouth/Throat: No oropharyngeal exudate.  Eyes: EOM are normal. Pupils are equal, round, and reactive to light.  Neck: Neck supple.  Cardiovascular: Normal rate, regular rhythm and normal heart sounds.   Pulmonary/Chest: Effort normal and breath sounds normal.  Abdominal: Soft. Bowel sounds are normal. There is no tenderness.  Musculoskeletal:       Arms:      Legs: Lymphadenopathy:    He has no cervical adenopathy.  Skin:             Assessment & Plan:

## 2012-08-23 NOTE — Assessment & Plan Note (Addendum)
Will recheck his ESR and CRP today. He is doing ok, still with swelling and mild increase in warmth. He will complete his anbx in 13 days, have PIC pulled then. Will change him to po levaquin (plan for 6 months?), he will speak with his nephrologist about this. I ran it through epocrates and the cipro/cellcept interaction (possible decreased cellcept levels) was of uncertain significance. Will use a lower dose of levaquin due to possible interaction and his CrCl of 48-49.  Will see him back in 6 weeks  [facility] [address] [place] [facility]

## 2012-08-23 NOTE — Telephone Encounter (Signed)
Called [facility] in [place] to make sure the patient is still covered and open with them. Advised that patient new address is in [place] and he is not covered out of the [place] office. Was advised that they transferred his care themselves and he is due to have his PICC at the end of his treatment (about 2 weeks) unless the doctor extends his therapy.

## 2012-08-24 LAB — SEDIMENTATION RATE: Sed Rate: 21 mm/hr — ABNORMAL HIGH (ref 0–16)

## 2012-08-24 LAB — C-REACTIVE PROTEIN: CRP: 0.8 mg/dL — ABNORMAL HIGH (ref <0.60)

## 2012-08-28 ENCOUNTER — Encounter (HOSPITAL_BASED_OUTPATIENT_CLINIC_OR_DEPARTMENT_OTHER): Payer: PRIVATE HEALTH INSURANCE

## 2012-09-05 ENCOUNTER — Encounter: Payer: Self-pay | Admitting: Orthopedic Surgery

## 2012-09-11 ENCOUNTER — Other Ambulatory Visit (HOSPITAL_COMMUNITY): Payer: Self-pay | Admitting: Orthopedic Surgery

## 2012-09-11 DIAGNOSIS — M898X9 Other specified disorders of bone, unspecified site: Secondary | ICD-10-CM

## 2012-09-15 ENCOUNTER — Encounter: Payer: Self-pay | Admitting: Orthopedic Surgery

## 2012-09-22 ENCOUNTER — Encounter (HOSPITAL_COMMUNITY)
Admission: RE | Admit: 2012-09-22 | Discharge: 2012-09-22 | Disposition: A | Discharge status: Home / Self Care | Payer: PRIVATE HEALTH INSURANCE | Source: Ambulatory Visit | Attending: Orthopedic Surgery | Admitting: Orthopedic Surgery

## 2012-09-22 DIAGNOSIS — M899 Disorder of bone, unspecified: Secondary | ICD-10-CM | POA: Insufficient documentation

## 2012-09-22 DIAGNOSIS — M949 Disorder of cartilage, unspecified: Secondary | ICD-10-CM | POA: Insufficient documentation

## 2012-09-22 DIAGNOSIS — M898X9 Other specified disorders of bone, unspecified site: Secondary | ICD-10-CM

## 2012-09-22 MED ORDER — TECHNETIUM TC 99M MEDRONATE IV KIT
25.0000 | PACK | Freq: Once | INTRAVENOUS | Status: AC | PRN
  Administered 2012-09-22: 25 via INTRAVENOUS

## 2012-10-04 ENCOUNTER — Encounter: Payer: Self-pay | Admitting: Infectious Diseases

## 2012-10-04 ENCOUNTER — Ambulatory Visit (INDEPENDENT_AMBULATORY_CARE_PROVIDER_SITE_OTHER): Payer: PRIVATE HEALTH INSURANCE | Admitting: Infectious Diseases

## 2012-10-04 VITALS — BP 179/88 | HR 69 | Temp 97.6°F | Ht 73.0 in | Wt 183.0 lb

## 2012-10-04 DIAGNOSIS — I10 Essential (primary) hypertension: Secondary | ICD-10-CM | POA: Insufficient documentation

## 2012-10-04 DIAGNOSIS — M009 Pyogenic arthritis, unspecified: Secondary | ICD-10-CM

## 2012-10-04 NOTE — Progress Notes (Signed)
  Subjective:    Patient ID: [name], male    DOB: 13-Nov-1946, 66 y.o.   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 States he was adm to the hospital in March with CHF and since then has had recurrent effusions in his L knee. He states he has had the knee aspirated weekly since then. He denies fever or chills. Has simply had swelling. Denies trauma to his knee.  He had asiration of his knee on 9-5 which grew pseudomonas aeruginosa (pan-sensitive), 28,790 WBC. He was admitted 9-10 and underwent synovectomy. Cx in hospital was (-). He was sent home with cefepime via PIC on 9-12. CRP 7.1 and ESR 50.  He should have completed his cefepime on 10-22 and then transition to levaquin, plan for 6 months.  Has been going to PT. Still has some swelling in his knee, has been icing his knee also. He has having some LE swelling (taking a fluid pill, told to take if wt > 173). No problems with urination.  FSG 90 this AM ROM of his knee is better. Still has some weakness in his L leg in going up steps.   Review of Systems  Constitutional: Negative for appetite change and unexpected weight change.  Respiratory: Negative for chest tightness.   Cardiovascular: Negative for chest pain.  Gastrointestinal: Negative for diarrhea and constipation.  Genitourinary: Negative for difficulty urinating.  Neurological: Negative for numbness and headaches.       Objective:   Physical Exam  Constitutional: He appears well-developed and well-nourished.  Musculoskeletal:       Legs:         Assessment & Plan:

## 2012-10-04 NOTE — Assessment & Plan Note (Signed)
Doing well clinically. His CRP and ESR have come down. Will recheck at his f/u in 2 months. States that he is only allowed to take the levaquin for 3 months by his nephrologist.

## 2012-10-04 NOTE — Assessment & Plan Note (Signed)
He will call his nephrologist to f/u on this. He is Asx. His BP monitor at home is broken.

## 2012-10-15 ENCOUNTER — Encounter: Payer: Self-pay | Admitting: Orthopedic Surgery

## 2012-10-18 ENCOUNTER — Telehealth: Payer: Self-pay | Admitting: *Deleted

## 2012-10-18 NOTE — Telephone Encounter (Signed)
Per Dr. Moshe Cipro last office note that patient needs to be taking Levaquin daily for 6 months.  Pt's insurance is limiting the pt to 21 days /month.  RN called pt's insurance, [employer] @ [phone] and started a prior authorization for daily Levaquin.  Provided detailed pt diagnosis information.  [employer] will notifiy Dr. Ninetta Lights by fax of decision.

## 2012-10-20 ENCOUNTER — Other Ambulatory Visit: Payer: Self-pay | Admitting: Infectious Diseases

## 2012-10-20 DIAGNOSIS — M009 Pyogenic arthritis, unspecified: Secondary | ICD-10-CM

## 2012-10-20 MED ORDER — LEVOFLOXACIN 250 MG PO TABS: 250.0000 mg | ORAL_TABLET | Freq: Every day | ORAL | Status: DC

## 2012-10-20 NOTE — Progress Notes (Signed)
Patient ID: [name], male   DOB: 09-11-1946, 66 y.o.   MRN: [id number] Spoke with pharmacy/insurance. Pt can receive rx of r90 levaquin, but can needs to be written as 10 day supplies.

## 2012-10-23 ENCOUNTER — Other Ambulatory Visit: Payer: Self-pay | Admitting: *Deleted

## 2012-10-23 DIAGNOSIS — M009 Pyogenic arthritis, unspecified: Secondary | ICD-10-CM

## 2012-10-23 MED ORDER — LEVOFLOXACIN 250 MG PO TABS: 250.0000 mg | ORAL_TABLET | Freq: Every day | ORAL | Status: DC

## 2012-11-15 ENCOUNTER — Encounter: Payer: Self-pay | Admitting: Orthopedic Surgery

## 2012-11-24 ENCOUNTER — Inpatient Hospital Stay: Payer: Self-pay | Admitting: Unknown Physician Specialty

## 2012-11-24 ENCOUNTER — Ambulatory Visit: Payer: Self-pay | Admitting: Unknown Physician Specialty

## 2012-11-24 LAB — BASIC METABOLIC PANEL
Anion Gap: 10 (ref 7–16)
Chloride: 111 mmol/L — ABNORMAL HIGH (ref 98–107)
Co2: 21 mmol/L (ref 21–32)
Creatinine: 2.93 mg/dL — ABNORMAL HIGH (ref 0.60–1.30)
EGFR (African American): 25 — ABNORMAL LOW
Glucose: 201 mg/dL — ABNORMAL HIGH (ref 65–99)
Osmolality: 312 (ref 275–301)
Potassium: 4.5 mmol/L (ref 3.5–5.1)
Sodium: 142 mmol/L (ref 136–145)

## 2012-11-24 LAB — CBC
HCT: 33.3 % — ABNORMAL LOW (ref 40.0–52.0)
HGB: 10.5 g/dL — ABNORMAL LOW (ref 13.0–18.0)
MCH: 26.7 pg (ref 26.0–34.0)
MCHC: 31.4 g/dL — ABNORMAL LOW (ref 32.0–36.0)
MCV: 85 fL (ref 80–100)
Platelet: 137 10*3/uL — ABNORMAL LOW (ref 150–440)
RBC: 3.91 10*6/uL — ABNORMAL LOW (ref 4.40–5.90)
RDW: 19.8 % — ABNORMAL HIGH (ref 11.5–14.5)

## 2012-11-24 LAB — PROTIME-INR: INR: 1

## 2012-11-26 LAB — BASIC METABOLIC PANEL
Anion Gap: 9 (ref 7–16)
BUN: 65 mg/dL — ABNORMAL HIGH (ref 7–18)
Calcium, Total: 8.3 mg/dL — ABNORMAL LOW (ref 8.5–10.1)
Chloride: 110 mmol/L — ABNORMAL HIGH (ref 98–107)
Co2: 22 mmol/L (ref 21–32)
Creatinine: 2.58 mg/dL — ABNORMAL HIGH (ref 0.60–1.30)
EGFR (African American): 29 — ABNORMAL LOW
Glucose: 109 mg/dL — ABNORMAL HIGH (ref 65–99)
Osmolality: 301 (ref 275–301)
Potassium: 4.2 mmol/L (ref 3.5–5.1)
Sodium: 141 mmol/L (ref 136–145)

## 2012-11-26 LAB — HEMOGLOBIN: HGB: 8.8 g/dL — ABNORMAL LOW (ref 13.0–18.0)

## 2012-11-27 LAB — CBC WITH DIFFERENTIAL/PLATELET
Eosinophil %: 0.4 %
Lymphocyte %: 2.7 %
MCHC: 32.7 g/dL (ref 32.0–36.0)
MCV: 84 fL (ref 80–100)
Monocyte %: 10.2 %
Neutrophil #: 8.5 10*3/uL — ABNORMAL HIGH (ref 1.4–6.5)
Neutrophil %: 86.6 %
RDW: 19.3 % — ABNORMAL HIGH (ref 11.5–14.5)
WBC: 9.8 10*3/uL (ref 3.8–10.6)

## 2012-11-27 LAB — CREATININE, SERUM
Creatinine: 3 mg/dL — ABNORMAL HIGH (ref 0.60–1.30)
EGFR (African American): 24 — ABNORMAL LOW

## 2012-11-28 LAB — CBC WITH DIFFERENTIAL/PLATELET
Basophil #: 0 10*3/uL (ref 0.0–0.1)
Basophil %: 0 %
Eosinophil #: 0 10*3/uL (ref 0.0–0.7)
HGB: 8.5 g/dL — ABNORMAL LOW (ref 13.0–18.0)
Lymphocyte %: 1.7 %
MCHC: 33.3 g/dL (ref 32.0–36.0)
Monocyte #: 0.8 x10 3/mm (ref 0.2–1.0)
Monocyte %: 8.3 %
RBC: 3.04 10*6/uL — ABNORMAL LOW (ref 4.40–5.90)

## 2012-11-28 LAB — BASIC METABOLIC PANEL
Anion Gap: 9 (ref 7–16)
BUN: 77 mg/dL — ABNORMAL HIGH (ref 7–18)
Creatinine: 2.85 mg/dL — ABNORMAL HIGH (ref 0.60–1.30)
Glucose: 107 mg/dL — ABNORMAL HIGH (ref 65–99)
Potassium: 4.5 mmol/L (ref 3.5–5.1)
Sodium: 136 mmol/L (ref 136–145)

## 2012-11-29 LAB — PATHOLOGY REPORT

## 2012-11-29 LAB — CREATININE, SERUM: Creatinine: 2.56 mg/dL — ABNORMAL HIGH (ref 0.60–1.30)

## 2012-12-06 ENCOUNTER — Ambulatory Visit: Payer: PRIVATE HEALTH INSURANCE | Admitting: Infectious Diseases

## 2013-02-05 ENCOUNTER — Telehealth: Payer: Self-pay | Admitting: *Deleted

## 2013-02-05 NOTE — Telephone Encounter (Signed)
Patient called to ask if his pseudomonas infection could relapse.  He reports that he fell and broke his right hip 11/24/12.  He has been recovering well, but might have over used his left knee during the recovery process.  He does not report any swelling, just "similar pain" like when he had his previous infection. He will be getting blood work drawn tomorrow at [facility] that will include inflammatory markers.  He would like Dr. Ninetta Lights to review the labs results and advise him if he needs to follow up at [facility]. Andree Coss, RN

## 2013-02-06 NOTE — Telephone Encounter (Signed)
Can you bring me his labs tomorrow and we can discuss? thanks

## 2013-02-06 NOTE — Telephone Encounter (Signed)
Patient unable to get to [facility] today or yesterday.  Will go tomorrow for lab draws and will have them faxed to [place].

## 2013-02-07 ENCOUNTER — Other Ambulatory Visit: Payer: Self-pay | Admitting: Vascular Surgery

## 2013-02-16 NOTE — Telephone Encounter (Signed)
"  all labs checked out negative" per patient.  He will call if his s/s return in the future. Andree Coss, RN

## 2013-02-19 NOTE — Telephone Encounter (Signed)
Still needs appt.

## 2013-02-19 NOTE — Telephone Encounter (Signed)
Per patient, xrays were done last week that showed a disc issue at L3-L4.  He received a spinal injection that helped alleviate the pain.  He is to receive one more injection and follow up with the ordering physician after that.  Patient declined an appointment at this time, but stated he has our number at hand if he needs [place].

## 2013-02-27 ENCOUNTER — Other Ambulatory Visit: Payer: Self-pay | Admitting: Physician Assistant

## 2013-02-27 DIAGNOSIS — M549 Dorsalgia, unspecified: Secondary | ICD-10-CM

## 2013-02-28 ENCOUNTER — Ambulatory Visit
Admission: RE | Admit: 2013-02-28 | Discharge: 2013-02-28 | Disposition: A | Discharge status: Home / Self Care | Payer: PRIVATE HEALTH INSURANCE | Source: Ambulatory Visit | Attending: Physician Assistant | Admitting: Physician Assistant

## 2013-02-28 VITALS — BP 171/98 | HR 78

## 2013-02-28 DIAGNOSIS — M549 Dorsalgia, unspecified: Secondary | ICD-10-CM

## 2013-02-28 MED ORDER — METHYLPREDNISOLONE ACETATE 40 MG/ML INJ SUSP (RADIOLOG
120.0000 mg | Freq: Once | INTRAMUSCULAR | Status: AC
  Administered 2013-02-28: 120 mg via EPIDURAL

## 2013-02-28 MED ORDER — IOHEXOL 180 MG/ML  SOLN
1.0000 mL | Freq: Once | INTRAMUSCULAR | Status: AC | PRN
  Administered 2013-02-28: 1 mL via EPIDURAL

## 2013-03-13 ENCOUNTER — Other Ambulatory Visit: Payer: Self-pay | Admitting: Neurosurgery

## 2013-03-13 DIAGNOSIS — M5410 Radiculopathy, site unspecified: Secondary | ICD-10-CM

## 2013-03-13 DIAGNOSIS — M4800 Spinal stenosis, site unspecified: Secondary | ICD-10-CM

## 2013-03-15 ENCOUNTER — Ambulatory Visit
Admission: RE | Admit: 2013-03-15 | Discharge: 2013-03-15 | Disposition: A | Discharge status: Home / Self Care | Payer: PRIVATE HEALTH INSURANCE | Source: Ambulatory Visit | Attending: Neurosurgery | Admitting: Neurosurgery

## 2013-03-15 VITALS — BP 147/75 | HR 75

## 2013-03-15 DIAGNOSIS — M4800 Spinal stenosis, site unspecified: Secondary | ICD-10-CM

## 2013-03-15 DIAGNOSIS — M5410 Radiculopathy, site unspecified: Secondary | ICD-10-CM

## 2013-03-15 MED ORDER — ONDANSETRON HCL 4 MG/2ML IJ SOLN
4.0000 mg | Freq: Once | INTRAMUSCULAR | Status: AC
  Administered 2013-03-15: 4 mg via INTRAMUSCULAR

## 2013-03-15 MED ORDER — MEPERIDINE HCL 100 MG/ML IJ SOLN
75.0000 mg | Freq: Once | INTRAMUSCULAR | Status: AC
  Administered 2013-03-15: 75 mg via INTRAMUSCULAR

## 2013-03-15 MED ORDER — IOHEXOL 180 MG/ML  SOLN
17.0000 mL | Freq: Once | INTRAMUSCULAR | Status: AC | PRN
  Administered 2013-03-15: 17 mL via INTRATHECAL

## 2013-03-19 ENCOUNTER — Other Ambulatory Visit: Payer: PRIVATE HEALTH INSURANCE

## 2013-03-22 ENCOUNTER — Other Ambulatory Visit: Payer: Self-pay | Admitting: Neurosurgery

## 2013-03-22 ENCOUNTER — Encounter (HOSPITAL_COMMUNITY): Payer: Self-pay | Admitting: Pharmacy Technician

## 2013-03-23 ENCOUNTER — Other Ambulatory Visit: Payer: Self-pay | Admitting: Neurosurgery

## 2013-03-23 ENCOUNTER — Encounter (HOSPITAL_COMMUNITY): Payer: Self-pay | Admitting: *Deleted

## 2013-03-23 ENCOUNTER — Encounter (HOSPITAL_COMMUNITY)
Admission: RE | Admit: 2013-03-23 | Discharge: 2013-03-23 | Disposition: A | Discharge status: Home / Self Care | Payer: PRIVATE HEALTH INSURANCE | Source: Ambulatory Visit | Attending: Neurosurgery | Admitting: Neurosurgery

## 2013-03-23 ENCOUNTER — Encounter (HOSPITAL_COMMUNITY): Payer: Self-pay

## 2013-03-23 ENCOUNTER — Encounter (HOSPITAL_COMMUNITY)
Admission: RE | Admit: 2013-03-23 | Discharge: 2013-03-23 | Disposition: A | Discharge status: Home / Self Care | Payer: PRIVATE HEALTH INSURANCE | Source: Ambulatory Visit | Attending: Anesthesiology | Admitting: Anesthesiology

## 2013-03-23 HISTORY — DX: Foot drop, right foot: M21.371

## 2013-03-23 LAB — CBC
MCH: 29.2 pg (ref 26.0–34.0)
MCV: 94.1 fL (ref 78.0–100.0)
Platelets: 88 10*3/uL — ABNORMAL LOW (ref 150–400)
RBC: 3.22 MIL/uL — ABNORMAL LOW (ref 4.22–5.81)

## 2013-03-23 LAB — BASIC METABOLIC PANEL
CO2: 26 mEq/L (ref 19–32)
Calcium: 8.5 mg/dL (ref 8.4–10.5)
Glucose, Bld: 102 mg/dL — ABNORMAL HIGH (ref 70–99)
Sodium: 144 mEq/L (ref 135–145)

## 2013-03-23 NOTE — Pre-Procedure Instructions (Signed)
[name]  03/23/2013   Your procedure is scheduled on: Monday, May 12th  Report to [facility] at 5:30 AM.  Call this number if you have problems the morning of surgery: [phone]   Remember:   Do not eat food or drink liquids after midnight.   Take these medicines the morning of surgery with A SIP OF WATER:  carvedilol (COREG),mycophenolate (CELLCEPT).  May take if needed: ALPRAZolam (XANAX),oxyCODONE-acetaminophen (PERCOCET/ROXICET)    Do not wear jewelry, make-up or nail polish.  Do not wear lotions, powders, or perfumes. You may wear deodorant.  Men may shave face and neck.  Do not bring valuables to the hospital.  Contacts, dentures or bridgework may not be worn into surgery.  Leave suitcase in the car. After surgery it may be brought to your room.  For patients admitted to the hospital, checkout time is 11:00 AM the day of  discharge.   Patients discharged the day of surgery will not be allowed to drive  home.  Name and phone number of your driver: -  Special Instructions: Shower using CHG 2 nights before surgery and the night before surgery.  If you shower the day of surgery use CHG.  Use special wash - you have one bottle of CHG for all showers.  You should use approximately 1/3 of the bottle for each shower.   Please read over the following fact sheets that you were given: Pain Booklet, Coughing and Deep Breathing and Surgical Site Infection Prevention

## 2013-03-23 NOTE — Progress Notes (Signed)
Mr tierno Platelets are 75, Creatinine 2.21. I obtained labs from his PCP, Dr Nedra Hai , labs done Jan 31st 2014, Creatinine was 2.3, platelets were not drawn. Platlets were normal Sept 2013.   I informed Dr Krista Blue and he said it would be evaluated on Mon,"Nothing we can do right now."  Pt has a ICD/pacemaker, I faxed order request to Dr Dulce Sellar in [place].  I have not received pacemaker orders back .

## 2013-03-25 MED ORDER — CEFAZOLIN SODIUM-DEXTROSE 2-3 GM-% IV SOLR
2.0000 g | INTRAVENOUS | Status: AC
  Administered 2013-03-26: 2 g via INTRAVENOUS
  Filled 2013-03-25: qty 50

## 2013-03-26 ENCOUNTER — Encounter (HOSPITAL_COMMUNITY): Payer: Self-pay | Admitting: Anesthesiology

## 2013-03-26 ENCOUNTER — Ambulatory Visit (HOSPITAL_COMMUNITY)
Admission: RE | Admit: 2013-03-26 | Discharge: 2013-03-27 | Disposition: A | Discharge status: Home / Self Care | Payer: PRIVATE HEALTH INSURANCE | Source: Ambulatory Visit | Attending: Neurosurgery | Admitting: Neurosurgery

## 2013-03-26 ENCOUNTER — Encounter (HOSPITAL_COMMUNITY)
Admission: RE | Disposition: A | Discharge status: Home / Self Care | Payer: Self-pay | Source: Ambulatory Visit | Attending: Neurosurgery

## 2013-03-26 ENCOUNTER — Ambulatory Visit (HOSPITAL_COMMUNITY): Payer: PRIVATE HEALTH INSURANCE | Admitting: Anesthesiology

## 2013-03-26 ENCOUNTER — Ambulatory Visit (HOSPITAL_COMMUNITY): Payer: PRIVATE HEALTH INSURANCE

## 2013-03-26 DIAGNOSIS — M48061 Spinal stenosis, lumbar region without neurogenic claudication: Secondary | ICD-10-CM | POA: Insufficient documentation

## 2013-03-26 DIAGNOSIS — M48062 Spinal stenosis, lumbar region with neurogenic claudication: Secondary | ICD-10-CM

## 2013-03-26 DIAGNOSIS — Z940 Kidney transplant status: Secondary | ICD-10-CM | POA: Insufficient documentation

## 2013-03-26 DIAGNOSIS — Z794 Long term (current) use of insulin: Secondary | ICD-10-CM | POA: Insufficient documentation

## 2013-03-26 DIAGNOSIS — I509 Heart failure, unspecified: Secondary | ICD-10-CM | POA: Insufficient documentation

## 2013-03-26 DIAGNOSIS — E119 Type 2 diabetes mellitus without complications: Secondary | ICD-10-CM | POA: Insufficient documentation

## 2013-03-26 DIAGNOSIS — I252 Old myocardial infarction: Secondary | ICD-10-CM | POA: Insufficient documentation

## 2013-03-26 DIAGNOSIS — Z01812 Encounter for preprocedural laboratory examination: Secondary | ICD-10-CM | POA: Insufficient documentation

## 2013-03-26 DIAGNOSIS — Z95810 Presence of automatic (implantable) cardiac defibrillator: Secondary | ICD-10-CM | POA: Insufficient documentation

## 2013-03-26 DIAGNOSIS — Z01818 Encounter for other preprocedural examination: Secondary | ICD-10-CM | POA: Insufficient documentation

## 2013-03-26 DIAGNOSIS — N189 Chronic kidney disease, unspecified: Secondary | ICD-10-CM | POA: Insufficient documentation

## 2013-03-26 DIAGNOSIS — I129 Hypertensive chronic kidney disease with stage 1 through stage 4 chronic kidney disease, or unspecified chronic kidney disease: Secondary | ICD-10-CM | POA: Insufficient documentation

## 2013-03-26 HISTORY — PX: LUMBAR LAMINECTOMY/DECOMPRESSION MICRODISCECTOMY: SHX5026

## 2013-03-26 LAB — GLUCOSE, CAPILLARY
Glucose-Capillary: 148 mg/dL — ABNORMAL HIGH (ref 70–99)
Glucose-Capillary: 162 mg/dL — ABNORMAL HIGH (ref 70–99)
Glucose-Capillary: 144 mg/dL — ABNORMAL HIGH (ref 70–99)

## 2013-03-26 SURGERY — LUMBAR LAMINECTOMY/DECOMPRESSION MICRODISCECTOMY 1 LEVEL
Anesthesia: General | Site: Back | Laterality: Bilateral | Wound class: Clean

## 2013-03-26 MED ORDER — INSULIN ASPART 100 UNIT/ML ~~LOC~~ SOLN
0.0000 [IU] | Freq: Every day | SUBCUTANEOUS | Status: DC
  Administered 2013-03-26: 2 [IU] via SUBCUTANEOUS

## 2013-03-26 MED ORDER — ACETAMINOPHEN 325 MG PO TABS: 650.0000 mg | ORAL_TABLET | ORAL | Status: DC | PRN

## 2013-03-26 MED ORDER — CEFAZOLIN SODIUM-DEXTROSE 2-3 GM-% IV SOLR: 2.0000 g | INTRAVENOUS | Status: DC

## 2013-03-26 MED ORDER — ASPIRIN EC 81 MG PO TBEC
81.0000 mg | DELAYED_RELEASE_TABLET | Freq: Every evening | ORAL | Status: DC
  Administered 2013-03-26: 81 mg via ORAL
  Filled 2013-03-26 (×2): qty 1

## 2013-03-26 MED ORDER — INSULIN ASPART 100 UNIT/ML ~~LOC~~ SOLN: 6.0000 [IU] | Freq: Three times a day (TID) | SUBCUTANEOUS | Status: DC

## 2013-03-26 MED ORDER — SODIUM CHLORIDE 0.9 % IR SOLN
Status: DC | PRN
  Administered 2013-03-26: 09:00:00

## 2013-03-26 MED ORDER — NITROGLYCERIN 0.4 MG SL SUBL: 0.4000 mg | SUBLINGUAL_TABLET | SUBLINGUAL | Status: DC | PRN

## 2013-03-26 MED ORDER — BUPIVACAINE HCL (PF) 0.5 % IJ SOLN
INTRAMUSCULAR | Status: DC | PRN
  Administered 2013-03-26: 20 mL

## 2013-03-26 MED ORDER — LACTATED RINGERS IV SOLN
INTRAVENOUS | Status: DC | PRN
  Administered 2013-03-26: 07:00:00 via INTRAVENOUS

## 2013-03-26 MED ORDER — HEMOSTATIC AGENTS (NO CHARGE) OPTIME
TOPICAL | Status: DC | PRN
  Administered 2013-03-26: 1 via TOPICAL

## 2013-03-26 MED ORDER — INSULIN ASPART 100 UNIT/ML ~~LOC~~ SOLN
4.0000 [IU] | Freq: Three times a day (TID) | SUBCUTANEOUS | Status: DC
  Administered 2013-03-26 – 2013-03-27 (×3): 4 [IU] via SUBCUTANEOUS

## 2013-03-26 MED ORDER — PHENYLEPHRINE HCL 10 MG/ML IJ SOLN
INTRAMUSCULAR | Status: DC | PRN
  Administered 2013-03-26 (×2): 40 ug via INTRAVENOUS

## 2013-03-26 MED ORDER — STERILE WATER FOR IRRIGATION IR SOLN
Status: DC | PRN
  Administered 2013-03-26: 1000 mL

## 2013-03-26 MED ORDER — THROMBIN 5000 UNITS EX SOLR
CUTANEOUS | Status: DC | PRN
  Administered 2013-03-26 (×2): 5000 [IU] via TOPICAL

## 2013-03-26 MED ORDER — PHENOL 1.4 % MT LIQD
1.0000 | OROMUCOSAL | Status: DC | PRN
  Administered 2013-03-26: 1 via OROMUCOSAL

## 2013-03-26 MED ORDER — HYDROMORPHONE HCL PF 1 MG/ML IJ SOLN
INTRAMUSCULAR | Status: AC
  Filled 2013-03-26: qty 1

## 2013-03-26 MED ORDER — DEXAMETHASONE SODIUM PHOSPHATE 10 MG/ML IJ SOLN
10.0000 mg | INTRAMUSCULAR | Status: AC
  Administered 2013-03-26: 10 mg via INTRAVENOUS

## 2013-03-26 MED ORDER — INSULIN ASPART 100 UNIT/ML ~~LOC~~ SOLN
0.0000 [IU] | Freq: Three times a day (TID) | SUBCUTANEOUS | Status: DC
  Administered 2013-03-26: 3 [IU] via SUBCUTANEOUS
  Administered 2013-03-26: 2 [IU] via SUBCUTANEOUS

## 2013-03-26 MED ORDER — MIDAZOLAM HCL 5 MG/5ML IJ SOLN
INTRAMUSCULAR | Status: DC | PRN
  Administered 2013-03-26: 1 mg via INTRAVENOUS

## 2013-03-26 MED ORDER — FENTANYL CITRATE 0.05 MG/ML IJ SOLN
INTRAMUSCULAR | Status: DC | PRN
  Administered 2013-03-26 (×3): 50 ug via INTRAVENOUS

## 2013-03-26 MED ORDER — SODIUM CHLORIDE 0.9 % IV SOLN
INTRAVENOUS | Status: AC
  Filled 2013-03-26: qty 500

## 2013-03-26 MED ORDER — GLYCOPYRROLATE 0.2 MG/ML IJ SOLN
INTRAMUSCULAR | Status: DC | PRN
  Administered 2013-03-26: 0.6 mg via INTRAVENOUS

## 2013-03-26 MED ORDER — ATORVASTATIN CALCIUM 40 MG PO TABS
40.0000 mg | ORAL_TABLET | Freq: Every day | ORAL | Status: DC
  Administered 2013-03-26: 40 mg via ORAL
  Filled 2013-03-26 (×2): qty 1

## 2013-03-26 MED ORDER — DEXAMETHASONE SODIUM PHOSPHATE 10 MG/ML IJ SOLN
10.0000 mg | INTRAMUSCULAR | Status: DC
  Filled 2013-03-26: qty 1

## 2013-03-26 MED ORDER — ALBUMIN HUMAN 5 % IV SOLN
INTRAVENOUS | Status: DC | PRN
  Administered 2013-03-26: 09:00:00 via INTRAVENOUS

## 2013-03-26 MED ORDER — HYDROMORPHONE HCL PF 1 MG/ML IJ SOLN
0.2500 mg | INTRAMUSCULAR | Status: DC | PRN
  Administered 2013-03-26 (×2): 0.5 mg via INTRAVENOUS
  Administered 2013-03-26: 1 mg via INTRAVENOUS

## 2013-03-26 MED ORDER — OXYCODONE-ACETAMINOPHEN 5-325 MG PO TABS
ORAL_TABLET | ORAL | Status: AC
  Filled 2013-03-26: qty 2

## 2013-03-26 MED ORDER — ACETAMINOPHEN 650 MG RE SUPP: 650.0000 mg | RECTAL | Status: DC | PRN

## 2013-03-26 MED ORDER — PROPOFOL 10 MG/ML IV BOLUS
INTRAVENOUS | Status: DC | PRN
  Administered 2013-03-26: 140 mg via INTRAVENOUS

## 2013-03-26 MED ORDER — LIDOCAINE HCL (CARDIAC) 20 MG/ML IV SOLN
INTRAVENOUS | Status: DC | PRN
  Administered 2013-03-26: 50 mg via INTRAVENOUS

## 2013-03-26 MED ORDER — PANTOPRAZOLE SODIUM 40 MG IV SOLR
40.0000 mg | Freq: Every day | INTRAVENOUS | Status: DC
  Administered 2013-03-26: 40 mg via INTRAVENOUS
  Filled 2013-03-26 (×2): qty 40

## 2013-03-26 MED ORDER — CARVEDILOL 12.5 MG PO TABS
12.5000 mg | ORAL_TABLET | Freq: Two times a day (BID) | ORAL | Status: DC
  Administered 2013-03-26 – 2013-03-27 (×2): 12.5 mg via ORAL
  Filled 2013-03-26 (×4): qty 1

## 2013-03-26 MED ORDER — BACITRACIN 50000 UNITS IM SOLR
INTRAMUSCULAR | Status: AC
  Filled 2013-03-26: qty 1

## 2013-03-26 MED ORDER — MENTHOL 3 MG MT LOZG
1.0000 | LOZENGE | OROMUCOSAL | Status: DC | PRN
  Filled 2013-03-26: qty 9

## 2013-03-26 MED ORDER — ROCURONIUM BROMIDE 100 MG/10ML IV SOLN
INTRAVENOUS | Status: DC | PRN
  Administered 2013-03-26: 40 mg via INTRAVENOUS

## 2013-03-26 MED ORDER — POTASSIUM CHLORIDE IN NACL 20-0.45 MEQ/L-% IV SOLN
INTRAVENOUS | Status: DC
  Filled 2013-03-26 (×3): qty 1000

## 2013-03-26 MED ORDER — ALPRAZOLAM 0.25 MG PO TABS: 0.2500 mg | ORAL_TABLET | Freq: Every evening | ORAL | Status: DC | PRN

## 2013-03-26 MED ORDER — INSULIN DETEMIR 100 UNIT/ML ~~LOC~~ SOLN
40.0000 [IU] | Freq: Every day | SUBCUTANEOUS | Status: DC
  Administered 2013-03-26: 40 [IU] via SUBCUTANEOUS
  Filled 2013-03-26 (×2): qty 0.4

## 2013-03-26 MED ORDER — ONDANSETRON HCL 4 MG/2ML IJ SOLN
INTRAMUSCULAR | Status: DC | PRN
  Administered 2013-03-26: 4 mg via INTRAVENOUS

## 2013-03-26 MED ORDER — ARTIFICIAL TEARS OP OINT
TOPICAL_OINTMENT | OPHTHALMIC | Status: DC | PRN
  Administered 2013-03-26: 1 via OPHTHALMIC

## 2013-03-26 MED ORDER — FUROSEMIDE 40 MG PO TABS
40.0000 mg | ORAL_TABLET | Freq: Every day | ORAL | Status: DC | PRN
  Administered 2013-03-26: 40 mg via ORAL
  Filled 2013-03-26 (×2): qty 1

## 2013-03-26 MED ORDER — SODIUM CHLORIDE 0.9 % IJ SOLN: 3.0000 mL | INTRAMUSCULAR | Status: DC | PRN

## 2013-03-26 MED ORDER — HYDROMORPHONE HCL PF 1 MG/ML IJ SOLN
1.0000 mg | INTRAMUSCULAR | Status: DC | PRN
  Administered 2013-03-26 – 2013-03-27 (×2): 1 mg via INTRAMUSCULAR
  Filled 2013-03-26 (×2): qty 1

## 2013-03-26 MED ORDER — SODIUM CHLORIDE 0.9 % IJ SOLN
3.0000 mL | Freq: Two times a day (BID) | INTRAMUSCULAR | Status: DC
  Administered 2013-03-26 – 2013-03-27 (×3): 3 mL via INTRAVENOUS

## 2013-03-26 MED ORDER — 0.9 % SODIUM CHLORIDE (POUR BTL) OPTIME
TOPICAL | Status: DC | PRN
  Administered 2013-03-26: 1000 mL

## 2013-03-26 MED ORDER — NEOSTIGMINE METHYLSULFATE 1 MG/ML IJ SOLN
INTRAMUSCULAR | Status: DC | PRN
  Administered 2013-03-26: 4 mg via INTRAVENOUS

## 2013-03-26 MED ORDER — CEFAZOLIN SODIUM 1-5 GM-% IV SOLN
1.0000 g | Freq: Three times a day (TID) | INTRAVENOUS | Status: AC
  Administered 2013-03-26 (×2): 1 g via INTRAVENOUS
  Filled 2013-03-26 (×2): qty 50

## 2013-03-26 MED ORDER — ONDANSETRON HCL 4 MG/2ML IJ SOLN: 4.0000 mg | INTRAMUSCULAR | Status: DC | PRN

## 2013-03-26 MED ORDER — OXYCODONE-ACETAMINOPHEN 5-325 MG PO TABS
1.0000 | ORAL_TABLET | ORAL | Status: DC | PRN
  Administered 2013-03-26 – 2013-03-27 (×5): 2 via ORAL
  Filled 2013-03-26 (×4): qty 2

## 2013-03-26 SURGICAL SUPPLY — 48 items
BAG DECANTER FOR FLEXI CONT (MISCELLANEOUS) ×2 IMPLANT
BENZOIN TINCTURE PRP APPL 2/3 (GAUZE/BANDAGES/DRESSINGS) ×2 IMPLANT
BLADE SURG ROTATE 9660 (MISCELLANEOUS) ×2 IMPLANT
BRUSH SCRUB EZ PLAIN DRY (MISCELLANEOUS) ×2 IMPLANT
BUR CUTTER 7.0 ROUND (BURR) ×2 IMPLANT
BUR MATCHSTICK NEURO 3.0 LAGG (BURR) ×2 IMPLANT
CANISTER SUCTION 2500CC (MISCELLANEOUS) ×2 IMPLANT
CLOTH BEACON ORANGE TIMEOUT ST (SAFETY) ×2 IMPLANT
CONT SPEC 4OZ CLIKSEAL STRL BL (MISCELLANEOUS) ×2 IMPLANT
DERMABOND ADHESIVE PROPEN (GAUZE/BANDAGES/DRESSINGS) ×1
DERMABOND ADVANCED (GAUZE/BANDAGES/DRESSINGS) ×1
DERMABOND ADVANCED .7 DNX12 (GAUZE/BANDAGES/DRESSINGS) ×1 IMPLANT
DERMABOND ADVANCED .7 DNX6 (GAUZE/BANDAGES/DRESSINGS) ×1 IMPLANT
DRAPE LAPAROTOMY 100X72X124 (DRAPES) ×2 IMPLANT
DRAPE MICROSCOPE ZEISS OPMI (DRAPES) ×2 IMPLANT
DRAPE SURG 17X23 STRL (DRAPES) ×4 IMPLANT
DRESSING TELFA 8X3 (GAUZE/BANDAGES/DRESSINGS) ×2 IMPLANT
DURAPREP 26ML APPLICATOR (WOUND CARE) ×2 IMPLANT
ELECT REM PT RETURN 9FT ADLT (ELECTROSURGICAL) ×2
ELECTRODE REM PT RTRN 9FT ADLT (ELECTROSURGICAL) ×1 IMPLANT
GAUZE SPONGE 4X4 16PLY XRAY LF (GAUZE/BANDAGES/DRESSINGS) IMPLANT
GLOVE BIOGEL PI IND STRL 8.5 (GLOVE) ×1 IMPLANT
GLOVE BIOGEL PI INDICATOR 8.5 (GLOVE) ×1
GLOVE ECLIPSE 7.5 STRL STRAW (GLOVE) ×2 IMPLANT
GLOVE ECLIPSE 8.5 STRL (GLOVE) ×2 IMPLANT
GOWN BRE IMP SLV AUR LG STRL (GOWN DISPOSABLE) IMPLANT
GOWN BRE IMP SLV AUR XL STRL (GOWN DISPOSABLE) ×2 IMPLANT
GOWN STRL REIN 2XL LVL4 (GOWN DISPOSABLE) ×4 IMPLANT
KIT BASIN OR (CUSTOM PROCEDURE TRAY) ×2 IMPLANT
KIT ROOM TURNOVER OR (KITS) ×2 IMPLANT
NEEDLE HYPO 22GX1.5 SAFETY (NEEDLE) ×2 IMPLANT
NEEDLE SPNL 22GX3.5 QUINCKE BK (NEEDLE) ×4 IMPLANT
NS IRRIG 1000ML POUR BTL (IV SOLUTION) ×2 IMPLANT
PACK LAMINECTOMY NEURO (CUSTOM PROCEDURE TRAY) ×2 IMPLANT
PAD ARMBOARD 7.5X6 YLW CONV (MISCELLANEOUS) ×6 IMPLANT
PATTIES SURGICAL .75X.75 (GAUZE/BANDAGES/DRESSINGS) ×2 IMPLANT
RUBBERBAND STERILE (MISCELLANEOUS) ×4 IMPLANT
SPONGE GAUZE 4X4 12PLY (GAUZE/BANDAGES/DRESSINGS) ×2 IMPLANT
SPONGE SURGIFOAM ABS GEL SZ50 (HEMOSTASIS) ×2 IMPLANT
STRIP CLOSURE SKIN 1/2X4 (GAUZE/BANDAGES/DRESSINGS) ×2 IMPLANT
SUT VIC AB 2-0 OS6 18 (SUTURE) ×8 IMPLANT
SUT VIC AB 3-0 CP2 18 (SUTURE) ×2 IMPLANT
SYR 20ML ECCENTRIC (SYRINGE) ×2 IMPLANT
TAPE CLOTH SURG 4X10 WHT LF (GAUZE/BANDAGES/DRESSINGS) ×2 IMPLANT
TAPE STRIPS DRAPE STRL (GAUZE/BANDAGES/DRESSINGS) ×2 IMPLANT
TOWEL OR 17X24 6PK STRL BLUE (TOWEL DISPOSABLE) ×2 IMPLANT
TOWEL OR 17X26 10 PK STRL BLUE (TOWEL DISPOSABLE) ×2 IMPLANT
WATER STERILE IRR 1000ML POUR (IV SOLUTION) ×2 IMPLANT

## 2013-03-26 NOTE — Preoperative (Signed)
Beta Blockers   Reason not to administer Beta Blockers:Not Applicable 

## 2013-03-26 NOTE — OR Nursing (Signed)
icd back on  [facility] [employer]

## 2013-03-26 NOTE — Anesthesia Postprocedure Evaluation (Signed)
  Anesthesia Post-op Note  Patient: [name]  Procedure(s) Performed: Procedure(s): LUMBAR LAMINECTOMY/DECOMPRESSION MICRODISCECTOMY 1 LEVEL (Bilateral)  Patient Location: PACU  Anesthesia Type:General  Level of Consciousness: awake  Airway and Oxygen Therapy: Patient Spontanous Breathing  Post-op Pain: mild  Post-op Assessment: Post-op Vital signs reviewed  Post-op Vital Signs: Reviewed  Complications: No apparent anesthesia complications

## 2013-03-26 NOTE — Progress Notes (Signed)
Notified [employer] Rep. [facility]) of patient having 730 lumbar surgery today.  [name] stated they could use magnet for surgery and could call his cell number if needed.(number taped to front of chart).

## 2013-03-26 NOTE — Op Note (Signed)
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 Localizing x-rays taken prior to incision to identify the appropriate level. Midline incision was made above the spinous processes of L3 and L4. Using Bovie cutting current the incision was carried on the spinous processes. Subperiosteal dissection was then carried out bilaterally along the spinous processes lamina and self-retaining tract was placed for exposure. X-ray showed approach the appropriate level. Starting the patient's left side generous laminotomy was performed removing the inferior one half of the L3 lamina the medial facet joint the superior one third of the L4 lamina. Residual bone and ligamentum flavum removed in piecemeal fashion. Summary compression was then carried on the patient's right side. At this time we examined the discs starting on the right side. This was not done be herniated at the disc space but underneath the nerve root inferior to the disc space there was free fragment of disc material which was removed in a piecemeal fashion gave excellent decompression the thecal sac and L4 nerve root on the right. On the patient's left side microdissection technique was used the disc was found to be significantly herniated so we coagulated the annulus incised a 15 blade and thoroughly cleaned out the disc space with pituitary rongeurs and curettes. At this time inspection was carried out in all directions any evidence of residual compression and none could be identified. Irrigation was carried out on the exiting any bleeding control proper coagulation  Gelfoam. The was then closed in multiple layers of Vicryl on the muscle fascia subcutaneous and subcuticular tissues. Dermabond and Steri-Strips were placed on the skin. A sterile dressing was then applied and the patient was extubated and taken to recovery room in stable condition.

## 2013-03-26 NOTE — Anesthesia Preprocedure Evaluation (Addendum)
Anesthesia Evaluation  Patient identified by MRN, date of birth, ID band Patient awake  General Assessment Comment:Case and labs discussed with Dr. Gerlene Fee. CE  Reviewed: Allergy & Precautions, H&P , NPO status , Patient's Chart, lab work & pertinent test results, reviewed documented beta blocker date and time   Airway Mallampati: II TM Distance: >3 FB Neck ROM: Limited  Mouth opening: Limited Mouth Opening  Dental  (+) Teeth Intact and Dental Advisory Given   Pulmonary pneumonia -, resolved,    Pulmonary exam normal       Cardiovascular hypertension, Pt. on home beta blockers and Pt. on medications + Past MI and +CHF + dysrhythmias + pacemaker + Cardiac Defibrillator     Neuro/Psych    GI/Hepatic   Endo/Other  diabetes, Type 2, Insulin Dependent  Renal/GU Renal disease     Musculoskeletal   Abdominal Normal abdominal exam  (+)   Peds  Hematology   Anesthesia Other Findings   Reproductive/Obstetrics                       Anesthesia Physical Anesthesia Plan  ASA: III  Anesthesia Plan: General   Post-op Pain Management:    Induction: Intravenous  Airway Management Planned: Oral ETT  Additional Equipment:   Intra-op Plan:   Post-operative Plan: Extubation in OR  Informed Consent: I have reviewed the patients History and Physical, chart, labs and discussed the procedure including the risks, benefits and alternatives for the proposed anesthesia with the patient or authorized representative who has indicated his/her understanding and acceptance.   Dental advisory given  Plan Discussed with: CRNA, Surgeon and Anesthesiologist  Anesthesia Plan Comments:         Anesthesia Quick Evaluation

## 2013-03-26 NOTE — Transfer of Care (Signed)
Immediate Anesthesia Transfer of Care Note  Patient: [name]  Procedure(s) Performed: Procedure(s): LUMBAR LAMINECTOMY/DECOMPRESSION MICRODISCECTOMY 1 LEVEL (Bilateral)  Patient Location: PACU  Anesthesia Type:General  Level of Consciousness: awake and alert   Airway & Oxygen Therapy: Patient Spontanous Breathing and Patient connected to face mask oxygen  Post-op Assessment: Report given to PACU RN  Post vital signs: Reviewed and stable  Complications: No apparent anesthesia complications

## 2013-03-26 NOTE — H&P (Signed)
[name] is an 67 y.o. male.   Chief Complaint: Back and bilateral leg pain HPI: The patient is a 67 year old gentleman who presented with back and bilateral leg pain. He was tried aggressive conservative therapy without improvement. Had what a plain CT scan which showed multiple levels of stenosis but we underwent myelography bili level there could be significant was L34 were he had complete myelographic block and severe stenosis was multifactorial. After discussing the options the patient requested surgery now comes for decompressive laminectomy and evaluation of the L3-4 disc. I had a long discussion with him regarding the risks and benefits of surgical intervention. The risks discussed include but are not limited to bleeding infection weakness numbness paralysis spinal fluid leak coma and death. We have discussed alternative methods of therapy offered risks and benefits of nonintervention. He's had the opportunity to ask questions and appears to understand. With this information in hand he has requested we proceed with surgery.  Past Medical History  Diagnosis Date  . Myocardial infarction 04/12/1990  . CHF (congestive heart failure) 01/2012  . Hypertension   . Diabetes mellitus   . ICD (implantable cardiac defibrillator) in place   . Pacemaker 02/15/2012    [employer]  . Pneumonia 2011  . Arthritis   . History of blood transfusion   . Chronic kidney disease     Glomerulonephritis  . Dysrhythmia   . Cancer     Bladder Cancer- 2004 .  SkinCancer- Basal, Squamous, and a few melymona  . Right foot drop     Past Surgical History  Procedure Laterality Date  . Kidney transplant  02/13/1973  . Coronary angioplasty  03/1990  . Skin cancer excision      Numerous  . Eye surgery      Cataract Right Eye  . Insert / replace / remove pacemaker    . Uretheral transplation      Left to Right (transplanted kidney)  . Skin transplant      Left thigh to Left hand  . Knee arthroscopy   07/25/2012    Procedure: ARTHROSCOPY KNEE;  Surgeon: Cammy Copa, MD;  Location: [facility] OR;  Service: Orthopedics;  Laterality: Left;  Left knee arthroscopy, debridement, cultures    History reviewed. No pertinent family history. Social History:  reports that he quit smoking about 30 years ago. He does not have any smokeless tobacco history on file. He reports that he does not drink alcohol or use illicit drugs.  Allergies: No Known Allergies  Medications Prior to Admission  Medication Sig Dispense Refill  . ALPRAZolam (XANAX) 0.25 MG tablet Take 0.25 mg by mouth at bedtime as needed for sleep or anxiety.       Marland Kitchen aspirin EC 81 MG tablet Take 81 mg by mouth every evening.       . carvedilol (COREG) 12.5 MG tablet Take 12.5 mg by mouth 2 (two) times daily with a meal.      . fish oil-omega-3 fatty acids 1000 MG capsule Take 1 g by mouth daily.      . furosemide (LASIX) 40 MG tablet Take 40 mg by mouth daily as needed (if weight is above 173 lbs).      . insulin aspart (NOVOLOG) 100 UNIT/ML injection Inject 6 Units into the skin 3 (three) times daily before meals.      . insulin detemir (LEVEMIR) 100 UNIT/ML injection Inject 40 Units into the skin at bedtime.       . Multiple  Vitamin (MULTIVITAMIN WITH MINERALS) TABS Take 1 tablet by mouth daily.      . mycophenolate (CELLCEPT) 500 MG tablet Take 500 mg by mouth 2 (two) times daily.       . nitroGLYCERIN (NITROSTAT) 0.4 MG SL tablet Place 0.4 mg under the tongue every 5 (five) minutes as needed for chest pain.       Marland Kitchen oxyCODONE-acetaminophen (PERCOCET/ROXICET) 5-325 MG per tablet Take 1 tablet by mouth at bedtime as needed for pain.      . predniSONE (DELTASONE) 5 MG tablet Take 5 mg by mouth 2 (two) times daily.      . ranitidine (ZANTAC) 300 MG tablet Take 300 mg by mouth at bedtime.       . simvastatin (ZOCOR) 80 MG tablet Take 80 mg by mouth at bedtime.      . vitamin C (ASCORBIC ACID) 500 MG tablet Take 500 mg by mouth daily.         Results for orders placed during the hospital encounter of 03/26/13 (from the past 48 hour(s))  GLUCOSE, CAPILLARY     Status: Abnormal   Collection Time    03/26/13  6:59 AM      Result Value Range   Glucose-Capillary 146 (*) 70 - 99 mg/dL   No results found.  Review of systems not obtained due to patient factors.  Blood pressure 150/81, pulse 66, temperature 97.1 F (36.2 C), temperature source Oral, resp. rate 20, SpO2 95.00%.  The patient is awake or and oriented. His difficulty ambulating due to the marked pain. His reflexes are decreased but equal. Strength is mildly decreased quadriceps muscle on the left and his sensation is intact. Assessment/Plan Impression is that of severe spinal stenosis, multifactorial, at L3-4. The plan is for an L3 for decompression with evaluation of the L3-4 disc.  Reinaldo Meeker, MD 03/26/2013, 7:34 AM

## 2013-03-26 NOTE — Plan of Care (Signed)
Problem: Consults Goal: Diagnosis - Spinal Surgery Outcome: Completed/Met Date Met:  03/26/13 Microdiscectomy     

## 2013-03-27 ENCOUNTER — Encounter (HOSPITAL_COMMUNITY): Payer: Self-pay | Admitting: Neurosurgery

## 2013-03-27 LAB — GLUCOSE, CAPILLARY: Glucose-Capillary: 84 mg/dL (ref 70–99)

## 2013-03-27 MED ORDER — FUROSEMIDE 40 MG PO TABS: 40.0000 mg | ORAL_TABLET | Freq: Every day | ORAL | Status: DC | PRN

## 2013-03-27 MED ORDER — OXYCODONE-ACETAMINOPHEN 5-325 MG PO TABS: 1.0000 | ORAL_TABLET | ORAL | Status: DC | PRN

## 2013-03-27 NOTE — Discharge Summary (Signed)
Physician Discharge Summary  Patient ID: [name] MRN: [id number] DOB/AGE: 1946-02-13 67 y.o.  Admit date: 03/26/2013 Discharge date: 03/27/2013  Admission Diagnoses:  Discharge Diagnoses:  Active Problems:   * No active hospital problems. *   Discharged Condition: good  Hospital Course: Surgery one day for stenosis L 34. Did well. Marked improvement in pain. Ambulated well. Wound clean and dry. Home POD 1, specific instructions given.  Consults: None  Significant Diagnostic Studies: none  Treatments: surgery: L 34 decompression and discectomy  Discharge Exam: Blood pressure 158/86, pulse 76, temperature 98.9 F (37.2 C), temperature source Oral, resp. rate 18, SpO2 92.00%. Incision/Wound:clean and dry  Disposition: 01-Home or Self Care  Discharge Orders   Future Appointments Provider Department Dept Phone   04/10/2013 2:30 PM Sherlene Shams, MD [facility] [phone]   Future Orders Complete By Expires     Call MD for:  difficulty breathing, headache or visual disturbances  As directed     Call MD for:  hives  As directed     Call MD for:  persistant nausea and vomiting  As directed     Call MD for:  redness, tenderness, or signs of infection (pain, swelling, redness, odor or green/yellow discharge around incision site)  As directed     Call MD for:  severe uncontrolled pain  As directed     Call MD for:  temperature >100.4  As directed     Diet general  As directed     Discharge instructions  As directed     Comments:      Mostly bedrest. Get up 9 or 10 times each day and walk for 15-20 minutes each time. Very little sitting the first week. No riding in the car until your first post op appointment. If you had neck surgery...may shower from the chest down. If you had low back surgery....you may shower with a saran wrap covering over the incision. Take your pain medicine as needed...and other medicines that you are instructed to take. Call for an  appointment...[phone].        Medication List    TAKE these medications       ALPRAZolam 0.25 MG tablet  Commonly known as:  XANAX  Take 0.25 mg by mouth at bedtime as needed for sleep or anxiety.     aspirin EC 81 MG tablet  Take 81 mg by mouth every evening.     carvedilol 12.5 MG tablet  Commonly known as:  COREG  Take 12.5 mg by mouth 2 (two) times daily with a meal.     fish oil-omega-3 fatty acids 1000 MG capsule  Take 1 g by mouth daily.     furosemide 40 MG tablet  Commonly known as:  LASIX  Take 1 tablet (40 mg total) by mouth daily as needed (if weight is above 173 lbs).     furosemide 40 MG tablet  Commonly known as:  LASIX  Take 40 mg by mouth daily as needed (if weight is above 173 lbs).     insulin aspart 100 UNIT/ML injection  Commonly known as:  novoLOG  Inject 6 Units into the skin 3 (three) times daily before meals.     insulin detemir 100 UNIT/ML injection  Commonly known as:  LEVEMIR  Inject 40 Units into the skin at bedtime.     multivitamin with minerals Tabs  Take 1 tablet by mouth daily.     mycophenolate 500 MG tablet  Commonly  known as:  CELLCEPT  Take 500 mg by mouth 2 (two) times daily.     nitroGLYCERIN 0.4 MG SL tablet  Commonly known as:  NITROSTAT  Place 0.4 mg under the tongue every 5 (five) minutes as needed for chest pain.     oxyCODONE-acetaminophen 5-325 MG per tablet  Commonly known as:  PERCOCET/ROXICET  Take 1-2 tablets by mouth every 4 (four) hours as needed.     oxyCODONE-acetaminophen 5-325 MG per tablet  Commonly known as:  PERCOCET/ROXICET  Take 1 tablet by mouth at bedtime as needed for pain.     predniSONE 5 MG tablet  Commonly known as:  DELTASONE  Take 5 mg by mouth 2 (two) times daily.     pyridOXINE 100 MG tablet  Commonly known as:  VITAMIN B-6  Take 200 mg by mouth daily.     ranitidine 300 MG tablet  Commonly known as:  ZANTAC  Take 300 mg by mouth at bedtime.     simvastatin 80 MG tablet   Commonly known as:  ZOCOR  Take 80 mg by mouth at bedtime.     tobramycin 0.3 % ophthalmic solution  Commonly known as:  TOBREX  Place 1 drop into both eyes as needed (for allergies).     vitamin B-12 250 MCG tablet  Commonly known as:  CYANOCOBALAMIN  Take 250 mcg by mouth daily.     vitamin C 500 MG tablet  Commonly known as:  ASCORBIC ACID  Take 500 mg by mouth daily.         At home rest most of the time. Get up 9 or 10 times each day and take a 15 or 20 minute walk. No riding in the car and to your first postoperative appointment. If you have neck surgery you may shower from the chest down starting on the third postoperative day. If you had back surgery he may start showering on the third postoperative day with saran wrap wrapped around your incisional area 3 times. After the shower remove the saran wrap. Take pain medicine as needed and other medications as instructed. Call my office for an appointment.  SignedReinaldo Meeker, MD 03/27/2013, 8:44 AM

## 2013-04-09 ENCOUNTER — Encounter (HOSPITAL_COMMUNITY): Payer: Self-pay | Admitting: Nurse Practitioner

## 2013-04-09 ENCOUNTER — Inpatient Hospital Stay (HOSPITAL_COMMUNITY)
Admission: EM | Admit: 2013-04-09 | Discharge: 2013-04-23 | DRG: 853 | Disposition: A | Discharge status: Rehabilitation Facility | Payer: PRIVATE HEALTH INSURANCE | Attending: Internal Medicine | Admitting: Internal Medicine

## 2013-04-09 ENCOUNTER — Emergency Department (HOSPITAL_COMMUNITY): Payer: PRIVATE HEALTH INSURANCE

## 2013-04-09 DIAGNOSIS — E1142 Type 2 diabetes mellitus with diabetic polyneuropathy: Secondary | ICD-10-CM | POA: Diagnosis present

## 2013-04-09 DIAGNOSIS — T504X5A Adverse effect of drugs affecting uric acid metabolism, initial encounter: Secondary | ICD-10-CM | POA: Diagnosis present

## 2013-04-09 DIAGNOSIS — Z794 Long term (current) use of insulin: Secondary | ICD-10-CM

## 2013-04-09 DIAGNOSIS — E538 Deficiency of other specified B group vitamins: Secondary | ICD-10-CM | POA: Diagnosis present

## 2013-04-09 DIAGNOSIS — I2510 Atherosclerotic heart disease of native coronary artery without angina pectoris: Secondary | ICD-10-CM

## 2013-04-09 DIAGNOSIS — I872 Venous insufficiency (chronic) (peripheral): Secondary | ICD-10-CM | POA: Diagnosis present

## 2013-04-09 DIAGNOSIS — I252 Old myocardial infarction: Secondary | ICD-10-CM

## 2013-04-09 DIAGNOSIS — M216X9 Other acquired deformities of unspecified foot: Secondary | ICD-10-CM | POA: Diagnosis present

## 2013-04-09 DIAGNOSIS — R748 Abnormal levels of other serum enzymes: Secondary | ICD-10-CM | POA: Diagnosis present

## 2013-04-09 DIAGNOSIS — L03115 Cellulitis of right lower limb: Secondary | ICD-10-CM

## 2013-04-09 DIAGNOSIS — I319 Disease of pericardium, unspecified: Secondary | ICD-10-CM | POA: Diagnosis present

## 2013-04-09 DIAGNOSIS — R197 Diarrhea, unspecified: Secondary | ICD-10-CM | POA: Diagnosis not present

## 2013-04-09 DIAGNOSIS — N189 Chronic kidney disease, unspecified: Secondary | ICD-10-CM

## 2013-04-09 DIAGNOSIS — I5023 Acute on chronic systolic (congestive) heart failure: Secondary | ICD-10-CM | POA: Diagnosis present

## 2013-04-09 DIAGNOSIS — E1149 Type 2 diabetes mellitus with other diabetic neurological complication: Secondary | ICD-10-CM | POA: Diagnosis present

## 2013-04-09 DIAGNOSIS — Z79899 Other long term (current) drug therapy: Secondary | ICD-10-CM

## 2013-04-09 DIAGNOSIS — I739 Peripheral vascular disease, unspecified: Secondary | ICD-10-CM | POA: Diagnosis present

## 2013-04-09 DIAGNOSIS — D631 Anemia in chronic kidney disease: Secondary | ICD-10-CM | POA: Diagnosis present

## 2013-04-09 DIAGNOSIS — N039 Chronic nephritic syndrome with unspecified morphologic changes: Secondary | ICD-10-CM | POA: Diagnosis present

## 2013-04-09 DIAGNOSIS — Z87891 Personal history of nicotine dependence: Secondary | ICD-10-CM

## 2013-04-09 DIAGNOSIS — I313 Pericardial effusion (noninflammatory): Secondary | ICD-10-CM

## 2013-04-09 DIAGNOSIS — E785 Hyperlipidemia, unspecified: Secondary | ICD-10-CM | POA: Diagnosis present

## 2013-04-09 DIAGNOSIS — R110 Nausea: Secondary | ICD-10-CM | POA: Diagnosis not present

## 2013-04-09 DIAGNOSIS — E860 Dehydration: Secondary | ICD-10-CM

## 2013-04-09 DIAGNOSIS — R6510 Systemic inflammatory response syndrome (SIRS) of non-infectious origin without acute organ dysfunction: Secondary | ICD-10-CM

## 2013-04-09 DIAGNOSIS — M5126 Other intervertebral disc displacement, lumbar region: Secondary | ICD-10-CM | POA: Diagnosis present

## 2013-04-09 DIAGNOSIS — E1169 Type 2 diabetes mellitus with other specified complication: Secondary | ICD-10-CM | POA: Diagnosis present

## 2013-04-09 DIAGNOSIS — N179 Acute kidney failure, unspecified: Secondary | ICD-10-CM

## 2013-04-09 DIAGNOSIS — R7989 Other specified abnormal findings of blood chemistry: Secondary | ICD-10-CM

## 2013-04-09 DIAGNOSIS — L0390 Cellulitis, unspecified: Secondary | ICD-10-CM

## 2013-04-09 DIAGNOSIS — J9601 Acute respiratory failure with hypoxia: Secondary | ICD-10-CM

## 2013-04-09 DIAGNOSIS — R9431 Abnormal electrocardiogram [ECG] [EKG]: Secondary | ICD-10-CM

## 2013-04-09 DIAGNOSIS — N183 Chronic kidney disease, stage 3 unspecified: Secondary | ICD-10-CM | POA: Diagnosis present

## 2013-04-09 DIAGNOSIS — A419 Sepsis, unspecified organism: Principal | ICD-10-CM

## 2013-04-09 DIAGNOSIS — I3139 Other pericardial effusion (noninflammatory): Secondary | ICD-10-CM

## 2013-04-09 DIAGNOSIS — Z7982 Long term (current) use of aspirin: Secondary | ICD-10-CM

## 2013-04-09 DIAGNOSIS — Z96649 Presence of unspecified artificial hip joint: Secondary | ICD-10-CM

## 2013-04-09 DIAGNOSIS — Z940 Kidney transplant status: Secondary | ICD-10-CM

## 2013-04-09 DIAGNOSIS — I248 Other forms of acute ischemic heart disease: Secondary | ICD-10-CM | POA: Insufficient documentation

## 2013-04-09 DIAGNOSIS — I129 Hypertensive chronic kidney disease with stage 1 through stage 4 chronic kidney disease, or unspecified chronic kidney disease: Secondary | ICD-10-CM | POA: Diagnosis present

## 2013-04-09 DIAGNOSIS — Z95810 Presence of automatic (implantable) cardiac defibrillator: Secondary | ICD-10-CM

## 2013-04-09 DIAGNOSIS — Z8551 Personal history of malignant neoplasm of bladder: Secondary | ICD-10-CM

## 2013-04-09 DIAGNOSIS — L02419 Cutaneous abscess of limb, unspecified: Secondary | ICD-10-CM | POA: Diagnosis present

## 2013-04-09 DIAGNOSIS — E119 Type 2 diabetes mellitus without complications: Secondary | ICD-10-CM

## 2013-04-09 DIAGNOSIS — J9600 Acute respiratory failure, unspecified whether with hypoxia or hypercapnia: Secondary | ICD-10-CM | POA: Diagnosis not present

## 2013-04-09 DIAGNOSIS — I509 Heart failure, unspecified: Secondary | ICD-10-CM | POA: Diagnosis present

## 2013-04-09 DIAGNOSIS — I428 Other cardiomyopathies: Secondary | ICD-10-CM | POA: Diagnosis present

## 2013-04-09 DIAGNOSIS — Z85828 Personal history of other malignant neoplasm of skin: Secondary | ICD-10-CM

## 2013-04-09 DIAGNOSIS — I2489 Other forms of acute ischemic heart disease: Secondary | ICD-10-CM

## 2013-04-09 DIAGNOSIS — I10 Essential (primary) hypertension: Secondary | ICD-10-CM

## 2013-04-09 DIAGNOSIS — L03119 Cellulitis of unspecified part of limb: Secondary | ICD-10-CM | POA: Diagnosis present

## 2013-04-09 DIAGNOSIS — Z9889 Other specified postprocedural states: Secondary | ICD-10-CM

## 2013-04-09 LAB — URINALYSIS, ROUTINE W REFLEX MICROSCOPIC
Glucose, UA: NEGATIVE mg/dL
Protein, ur: >300 mg/dL — AB
Specific Gravity, Urine: 1.012 (ref 1.005–1.030)

## 2013-04-09 LAB — CBC WITH DIFFERENTIAL/PLATELET
Basophils Absolute: 0 10*3/uL (ref 0.0–0.1)
Eosinophils Relative: 0 % (ref 0–5)
Lymphocytes Relative: 5 % — ABNORMAL LOW (ref 12–46)
Monocytes Relative: 5 % (ref 3–12)
Neutrophils Relative %: 90 % — ABNORMAL HIGH (ref 43–77)
Platelets: 157 10*3/uL (ref 150–400)
RBC: 3.5 MIL/uL — ABNORMAL LOW (ref 4.22–5.81)
RDW: 16.9 % — ABNORMAL HIGH (ref 11.5–15.5)
WBC: 17.8 10*3/uL — ABNORMAL HIGH (ref 4.0–10.5)

## 2013-04-09 LAB — POCT I-STAT 3, ART BLOOD GAS (G3+)
Acid-Base Excess: 2 mmol/L (ref 0.0–2.0)
Bicarbonate: 25.3 mEq/L — ABNORMAL HIGH (ref 20.0–24.0)
O2 Saturation: 99 %
pO2, Arterial: 138 mmHg — ABNORMAL HIGH (ref 80.0–100.0)

## 2013-04-09 LAB — COMPREHENSIVE METABOLIC PANEL
ALT: 20 U/L (ref 0–53)
AST: 29 U/L (ref 0–37)
Albumin: 2.9 g/dL — ABNORMAL LOW (ref 3.5–5.2)
Alkaline Phosphatase: 66 U/L (ref 39–117)
Chloride: 103 mEq/L (ref 96–112)
Creatinine, Ser: 2.44 mg/dL — ABNORMAL HIGH (ref 0.50–1.35)
Potassium: 4.7 mEq/L (ref 3.5–5.1)
Sodium: 140 mEq/L (ref 135–145)
Total Bilirubin: 0.4 mg/dL (ref 0.3–1.2)

## 2013-04-09 LAB — POCT I-STAT TROPONIN I

## 2013-04-09 LAB — URINE MICROSCOPIC-ADD ON

## 2013-04-09 LAB — GLUCOSE, CAPILLARY
Glucose-Capillary: 75 mg/dL (ref 70–99)
Glucose-Capillary: 26 mg/dL — CL (ref 70–99)
Glucose-Capillary: 85 mg/dL (ref 70–99)

## 2013-04-09 MED ORDER — ONDANSETRON HCL 4 MG PO TABS: 4.0000 mg | ORAL_TABLET | Freq: Four times a day (QID) | ORAL | Status: DC | PRN

## 2013-04-09 MED ORDER — VITAMIN C 500 MG PO TABS
500.0000 mg | ORAL_TABLET | Freq: Every day | ORAL | Status: DC
  Administered 2013-04-10 – 2013-04-23 (×13): 500 mg via ORAL
  Filled 2013-04-09 (×14): qty 1

## 2013-04-09 MED ORDER — VANCOMYCIN HCL IN DEXTROSE 1-5 GM/200ML-% IV SOLN
1000.0000 mg | INTRAVENOUS | Status: DC
  Administered 2013-04-10 – 2013-04-11 (×2): 1000 mg via INTRAVENOUS
  Filled 2013-04-09 (×4): qty 200

## 2013-04-09 MED ORDER — TOBRAMYCIN 0.3 % OP SOLN
1.0000 [drp] | OPHTHALMIC | Status: DC | PRN
  Administered 2013-04-11 – 2013-04-13 (×2): 1 [drp] via OPHTHALMIC
  Filled 2013-04-09: qty 5

## 2013-04-09 MED ORDER — SODIUM CHLORIDE 0.9 % IJ SOLN
3.0000 mL | Freq: Two times a day (BID) | INTRAMUSCULAR | Status: DC
  Administered 2013-04-10 – 2013-04-23 (×17): 3 mL via INTRAVENOUS

## 2013-04-09 MED ORDER — HEPARIN SODIUM (PORCINE) 5000 UNIT/ML IJ SOLN
5000.0000 [IU] | Freq: Three times a day (TID) | INTRAMUSCULAR | Status: DC
  Filled 2013-04-09: qty 1

## 2013-04-09 MED ORDER — ACETAMINOPHEN 325 MG PO TABS
650.0000 mg | ORAL_TABLET | Freq: Four times a day (QID) | ORAL | Status: DC | PRN
  Administered 2013-04-10: 650 mg via ORAL
  Filled 2013-04-09: qty 2

## 2013-04-09 MED ORDER — CARVEDILOL 12.5 MG PO TABS
12.5000 mg | ORAL_TABLET | Freq: Two times a day (BID) | ORAL | Status: DC
  Administered 2013-04-10 – 2013-04-23 (×26): 12.5 mg via ORAL
  Filled 2013-04-09 (×32): qty 1

## 2013-04-09 MED ORDER — CYANOCOBALAMIN 250 MCG PO TABS
250.0000 ug | ORAL_TABLET | Freq: Every day | ORAL | Status: DC
  Administered 2013-04-10 – 2013-04-23 (×13): 250 ug via ORAL
  Filled 2013-04-09 (×16): qty 1

## 2013-04-09 MED ORDER — ALBUTEROL SULFATE (5 MG/ML) 0.5% IN NEBU
2.5000 mg | INHALATION_SOLUTION | RESPIRATORY_TRACT | Status: DC
  Administered 2013-04-09 – 2013-04-10 (×2): 2.5 mg via RESPIRATORY_TRACT
  Filled 2013-04-09 (×2): qty 0.5

## 2013-04-09 MED ORDER — ATORVASTATIN CALCIUM 40 MG PO TABS
40.0000 mg | ORAL_TABLET | Freq: Every day | ORAL | Status: DC
  Administered 2013-04-10 – 2013-04-22 (×12): 40 mg via ORAL
  Filled 2013-04-09 (×14): qty 1

## 2013-04-09 MED ORDER — VANCOMYCIN HCL IN DEXTROSE 1-5 GM/200ML-% IV SOLN
1000.0000 mg | Freq: Once | INTRAVENOUS | Status: AC
  Administered 2013-04-09: 1000 mg via INTRAVENOUS
  Filled 2013-04-09: qty 200

## 2013-04-09 MED ORDER — NITROGLYCERIN 0.4 MG SL SUBL: 0.4000 mg | SUBLINGUAL_TABLET | SUBLINGUAL | Status: DC | PRN

## 2013-04-09 MED ORDER — DEXTROSE-NACL 5-0.9 % IV SOLN
Freq: Once | INTRAVENOUS | Status: AC
  Administered 2013-04-09: 20:00:00 via INTRAVENOUS

## 2013-04-09 MED ORDER — OMEGA-3-ACID ETHYL ESTERS 1 G PO CAPS
1.0000 g | ORAL_CAPSULE | Freq: Every day | ORAL | Status: DC
  Administered 2013-04-10 – 2013-04-23 (×13): 1 g via ORAL
  Filled 2013-04-09 (×14): qty 1

## 2013-04-09 MED ORDER — ALPRAZOLAM 0.25 MG PO TABS
0.2500 mg | ORAL_TABLET | Freq: Every evening | ORAL | Status: DC | PRN
  Administered 2013-04-10 – 2013-04-22 (×10): 0.25 mg via ORAL
  Filled 2013-04-09 (×11): qty 1

## 2013-04-09 MED ORDER — PREDNISONE 5 MG PO TABS
5.0000 mg | ORAL_TABLET | Freq: Two times a day (BID) | ORAL | Status: DC
  Administered 2013-04-10 – 2013-04-23 (×28): 5 mg via ORAL
  Filled 2013-04-09 (×31): qty 1

## 2013-04-09 MED ORDER — ASPIRIN EC 325 MG PO TBEC
325.0000 mg | DELAYED_RELEASE_TABLET | Freq: Every day | ORAL | Status: DC
  Administered 2013-04-10 – 2013-04-23 (×13): 325 mg via ORAL
  Filled 2013-04-09 (×14): qty 1

## 2013-04-09 MED ORDER — DEXTROSE-NACL 5-0.9 % IV SOLN: INTRAVENOUS | Status: DC

## 2013-04-09 MED ORDER — FAMOTIDINE 20 MG PO TABS
20.0000 mg | ORAL_TABLET | Freq: Every day | ORAL | Status: DC
  Administered 2013-04-10 – 2013-04-21 (×12): 20 mg via ORAL
  Filled 2013-04-09 (×13): qty 1

## 2013-04-09 MED ORDER — IPRATROPIUM BROMIDE 0.02 % IN SOLN
0.5000 mg | RESPIRATORY_TRACT | Status: DC
  Administered 2013-04-09 – 2013-04-10 (×2): 0.5 mg via RESPIRATORY_TRACT
  Filled 2013-04-09 (×2): qty 2.5

## 2013-04-09 MED ORDER — ACETAMINOPHEN 650 MG RE SUPP: 650.0000 mg | Freq: Four times a day (QID) | RECTAL | Status: DC | PRN

## 2013-04-09 MED ORDER — DEXTROSE 50 % IV SOLN
INTRAVENOUS | Status: AC
  Administered 2013-04-09: 50 mL
  Filled 2013-04-09: qty 50

## 2013-04-09 MED ORDER — MYCOPHENOLATE MOFETIL 250 MG PO CAPS
500.0000 mg | ORAL_CAPSULE | Freq: Two times a day (BID) | ORAL | Status: DC
  Administered 2013-04-10 – 2013-04-23 (×28): 500 mg via ORAL
  Filled 2013-04-09 (×30): qty 2

## 2013-04-09 MED ORDER — OMEGA-3 FATTY ACIDS 1000 MG PO CAPS: 1.0000 g | ORAL_CAPSULE | Freq: Every day | ORAL | Status: DC

## 2013-04-09 MED ORDER — PIPERACILLIN-TAZOBACTAM 3.375 G IVPB 30 MIN
3.3750 g | Freq: Once | INTRAVENOUS | Status: AC
  Administered 2013-04-09: 3.375 g via INTRAVENOUS
  Filled 2013-04-09: qty 50

## 2013-04-09 MED ORDER — PIPERACILLIN-TAZOBACTAM 3.375 G IVPB
3.3750 g | Freq: Three times a day (TID) | INTRAVENOUS | Status: DC
  Administered 2013-04-10 – 2013-04-17 (×22): 3.375 g via INTRAVENOUS
  Filled 2013-04-09 (×28): qty 50

## 2013-04-09 MED ORDER — SODIUM CHLORIDE 0.9 % IV SOLN: 1000.0000 mL | INTRAVENOUS | Status: DC

## 2013-04-09 MED ORDER — OXYCODONE-ACETAMINOPHEN 5-325 MG PO TABS
1.0000 | ORAL_TABLET | ORAL | Status: DC | PRN
  Administered 2013-04-10 – 2013-04-11 (×4): 1 via ORAL
  Filled 2013-04-09 (×4): qty 1

## 2013-04-09 MED ORDER — ONDANSETRON HCL 4 MG/2ML IJ SOLN
4.0000 mg | Freq: Four times a day (QID) | INTRAMUSCULAR | Status: DC | PRN
  Administered 2013-04-14 – 2013-04-18 (×7): 4 mg via INTRAVENOUS
  Filled 2013-04-09 (×7): qty 2

## 2013-04-09 MED ORDER — VITAMIN B-6 100 MG PO TABS
200.0000 mg | ORAL_TABLET | Freq: Every day | ORAL | Status: DC
  Administered 2013-04-10 – 2013-04-23 (×13): 200 mg via ORAL
  Filled 2013-04-09 (×14): qty 2

## 2013-04-09 MED ORDER — SODIUM CHLORIDE 0.9 % IV SOLN
Freq: Once | INTRAVENOUS | Status: AC
  Administered 2013-04-09: 20:00:00 via INTRAVENOUS

## 2013-04-09 NOTE — ED Notes (Signed)
CBG noted to be 26.  PA made aware.  Verbal given for D50.

## 2013-04-09 NOTE — ED Notes (Signed)
Pt's CBG is 75.RN notified.

## 2013-04-09 NOTE — Consult Note (Signed)
CARDIOLOGY CONSULT NOTE   [name] MRN: [id number] DOB/AGE: 65/12/1945 65 y.o. Admit date: 04/09/2013  Referring Physician:  Dr. 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  Pt reports that his glucometer has been off and is giving readings higher than they actually are.  He denies any CP, SOB, PND, orthopnea.  He reports having sx of CP prior to his last heart attack and denies any similar sx currently.  He has no other cardiac complaints.  Cardiology was consulted for evaluation of abnormal EKG and mildly elevated troponin.  Review of systems: A review of 10 organ systems was done and is negative except as stated above in HPI  Past Medical History  Diagnosis Date  . Myocardial infarction 04/12/1990  . CHF (congestive heart failure) 01/2012  . Hypertension   . Diabetes mellitus   . ICD (implantable cardiac defibrillator) in place   . Pacemaker 02/15/2012    [employer]  . Pneumonia 2011  . Arthritis   . History of blood transfusion   . Chronic kidney disease     Glomerulonephritis  . Dysrhythmia   . Cancer     Bladder Cancer- 2004 .  SkinCancer- Basal, Squamous, and a few melymona  . Right foot drop    Past Surgical History  Procedure Laterality Date  . Kidney transplant  02/13/1973  . Coronary angioplasty  03/1990  . Skin cancer excision      Numerous  . Eye surgery      Cataract Right Eye  . Insert / replace / remove pacemaker    . Uretheral transplation      Left to Right (transplanted kidney)  . Skin transplant      Left  thigh to Left hand  . Knee arthroscopy  07/25/2012    Procedure: ARTHROSCOPY KNEE;  Surgeon: Cammy Copa, MD;  Location: [facility] OR;  Service: Orthopedics;  Laterality: Left;  Left knee arthroscopy, debridement, cultures  . Lumbar laminectomy/decompression microdiscectomy Bilateral 03/26/2013    Procedure: LUMBAR LAMINECTOMY/DECOMPRESSION MICRODISCECTOMY 1 LEVEL;  Surgeon: Reinaldo Meeker, MD;  Location: [facility];  Service: Neurosurgery;  Laterality: Bilateral;   History   Social History  . Marital Status: Married    Spouse Name: N/A    Number of Children: N/A  . Years of Education: N/A   Occupational History  . Not on file.   Social History Main Topics  . Smoking status: Former Smoker -- 30 years    Quit date: 02/14/1983  . Smokeless tobacco: Not on file  . Alcohol Use: No  . Drug Use: No  . Sexually Active: Not on file   Other Topics Concern  . Not on file   Social History Narrative  . No narrative on file    History reviewed. No pertinent family history.   No Known Allergies  Prescriptions prior to admission  Medication Sig Dispense Refill  . ALPRAZolam (XANAX) 0.25 MG tablet Take 0.25 mg by mouth at bedtime as needed for sleep or anxiety.       Marland Kitchen aspirin EC 81  MG tablet Take 81 mg by mouth every evening.       . carvedilol (COREG) 12.5 MG tablet Take 12.5 mg by mouth 2 (two) times daily with a meal.      . fish oil-omega-3 fatty acids 1000 MG capsule Take 1 g by mouth daily.      . furosemide (LASIX) 40 MG tablet Take 40 mg by mouth daily as needed (if weight is above 173 lbs).      . insulin aspart (NOVOLOG) 100 UNIT/ML injection Inject 6 Units into the skin 3 (three) times daily before meals.      . insulin detemir (LEVEMIR) 100 UNIT/ML injection Inject 40 Units into the skin at bedtime.       . Multiple Vitamin (MULTIVITAMIN WITH MINERALS) TABS Take 1 tablet by mouth daily.      . mycophenolate (CELLCEPT) 500 MG tablet Take 500 mg by mouth 2 (two) times daily.        . nitroGLYCERIN (NITROSTAT) 0.4 MG SL tablet Place 0.4 mg under the tongue every 5 (five) minutes as needed for chest pain. x3 doses as needed for chest pain      . oxyCODONE-acetaminophen (PERCOCET/ROXICET) 5-325 MG per tablet Take 1 tablet by mouth every 4 (four) hours as needed for pain. For pain      . predniSONE (DELTASONE) 5 MG tablet Take 5 mg by mouth 2 (two) times daily.      Marland Kitchen pyridOXINE (VITAMIN B-6) 100 MG tablet Take 200 mg by mouth daily.      . ranitidine (ZANTAC) 300 MG tablet Take 300 mg by mouth at bedtime.       . simvastatin (ZOCOR) 80 MG tablet Take 80 mg by mouth at bedtime.      Marland Kitchen tobramycin (TOBREX) 0.3 % ophthalmic solution Place 1 drop into both eyes as needed (for allergies).      . vitamin B-12 (CYANOCOBALAMIN) 250 MCG tablet Take 250 mcg by mouth daily.      . vitamin C (ASCORBIC ACID) 500 MG tablet Take 500 mg by mouth daily.        Current facility-administered medications:acetaminophen (TYLENOL) suppository 650 mg, 650 mg, Rectal, Q6H PRN, Eduard Clos, MD;  acetaminophen (TYLENOL) tablet 650 mg, 650 mg, Oral, Q6H PRN, Eduard Clos, MD;  albuterol (PROVENTIL) (5 MG/ML) 0.5% nebulizer solution 2.5 mg, 2.5 mg, Nebulization, Q4H, Abigail Harris, PA-C, 2.5 mg at 04/09/13 1828;  ALPRAZolam (XANAX) tablet 0.25 mg, 0.25 mg, Oral, QHS PRN, Eduard Clos, MD [START ON 04/10/2013] aspirin EC tablet 325 mg, 325 mg, Oral, Daily, Eduard Clos, MD;  Melene Muller ON 04/10/2013] atorvastatin (LIPITOR) tablet 40 mg, 40 mg, Oral, q1800, Eduard Clos, MD;  Melene Muller ON 04/10/2013] carvedilol (COREG) tablet 12.5 mg, 12.5 mg, Oral, BID WC, Eduard Clos, MD;  dextrose 5 %-0.9 % sodium chloride infusion, , Intravenous, Continuous, Eduard Clos, MD Melene Muller ON 04/10/2013] famotidine (PEPCID) tablet 20 mg, 20 mg, Oral, Daily, Eduard Clos, MD;  Melene Muller ON 04/10/2013] fish oil-omega-3 fatty acids capsule 1 g, 1 g, Oral, Daily, Eduard Clos, MD;   heparin injection 5,000 Units, 5,000 Units, Subcutaneous, Q8H, Eduard Clos, MD;  ipratropium (ATROVENT) nebulizer solution 0.5 mg, 0.5 mg, Nebulization, Q4H, Arthor Captain, PA-C, 0.5 mg at 04/09/13 1829 mycophenolate (CELLCEPT) tablet 500 mg, 500 mg, Oral, BID, Eduard Clos, MD;  nitroGLYCERIN (NITROSTAT) SL tablet 0.4 mg, 0.4 mg, Sublingual, Q5 min PRN, Eduard Clos, MD;  ondansetron [facility]) injection  4 mg, 4 mg, Intravenous, Q6H PRN, Eduard Clos, MD;  ondansetron (ZOFRAN) tablet 4 mg, 4 mg, Oral, Q6H PRN, Eduard Clos, MD oxyCODONE-acetaminophen (PERCOCET/ROXICET) 5-325 MG per tablet 1 tablet, 1 tablet, Oral, Q4H PRN, Eduard Clos, MD;  Melene Muller ON 04/10/2013] piperacillin-tazobactam (ZOSYN) IVPB 3.375 g, 3.375 g, Intravenous, Q8H, Abran Duke, RPH;  predniSONE (DELTASONE) tablet 5 mg, 5 mg, Oral, BID, Eduard Clos, MD;  Melene Muller ON 04/10/2013] pyridOXINE (VITAMIN B-6) tablet 200 mg, 200 mg, Oral, Daily, Eduard Clos, MD sodium chloride 0.9 % injection 3 mL, 3 mL, Intravenous, Q12H, Eduard Clos, MD;  tobramycin (TOBREX) 0.3 % ophthalmic solution 1 drop, 1 drop, Both Eyes, PRN, Eduard Clos, MD;  Melene Muller ON 04/10/2013] vancomycin (VANCOCIN) IVPB 1000 mg/200 mL premix, 1,000 mg, Intravenous, Q24H, Abran Duke, RPH;  Melene Muller ON 04/10/2013] vitamin B-12 (CYANOCOBALAMIN) tablet 250 mcg, 250 mcg, Oral, Daily, Eduard Clos, MD Melene Muller ON 04/10/2013] vitamin C (ASCORBIC ACID) tablet 500 mg, 500 mg, Oral, Daily, Eduard Clos, MD  Physical Exam: Blood pressure 156/64, pulse 93, temperature 100 F (37.8 C), temperature source Oral, resp. rate 22, SpO2 100.00%.; There is no weight on file to calculate BMI. Temp:  [100 F (37.8 C)-101.9 F (38.8 C)] 100 F (37.8 C) (05/26 2150) Pulse Rate:  [93-101] 93 (05/26 2145) Resp:  [20-22] 22 (05/26 2145) BP: (137-159)/(64-90) 156/64 mmHg (05/26 2145) SpO2:  [97 %-100 %] 100 % (05/26 2145)    No intake or output data in the 24 hours ending 04/09/13 2323 General: NAD Heent: MMM Neck: JVP about 14 cm CV: RRR, no appreciable m  Lungs: Clear to auscultation bilaterally with normal respiratory effort Abdomen: Soft, nontender, nondistended Extremities: No clubbing or cyanosis.  1+ pitting edema bilat Skin: multiple scabbed lesions over all extremities.  LE with erythema up to knees bilaterally Neurologic: Alert and oriented x 3, grossly nonfocal  Psych: Normal mood and affect    Labs: No results found for this basename: CKTOTAL, CKMB, TROPONINI,  in the last 72 hours Lab Results  Component Value Date   WBC 17.8* 04/09/2013   HGB 10.4* 04/09/2013   HCT 33.1* 04/09/2013   MCV 94.6 04/09/2013   PLT 157 04/09/2013    Recent Labs Lab 04/09/13 1855  NA 140  K 4.7  CL 103  CO2 24  BUN 63*  CREATININE 2.44*  CALCIUM 9.1  PROT 5.3*  BILITOT 0.4  ALKPHOS 66  ALT 20  AST 29  GLUCOSE 55*   No results found for this basename: CHOL, HDL, LDLCALC, TRIG    EKG:   03/26/13 appears to be A and V paced  04/09/13 1833 sinus tach, V paced, 4-5 mm STE II, III, aVF - motion artifact and noise from muscle movement make it difficult to interpret 04/09/13 2329 sinus, V paced, 2 mm STE III  Radiology:  Dg Chest Port 1 View  04/09/2013   *RADIOLOGY REPORT*  Clinical Data: Hypoglycemia and shortness of breath  PORTABLE CHEST - 1 VIEW  Comparison: Chest radiograph 03/23/2013  Findings: Stable enlarged cardiac silhouette.  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  There are low lung volumes and central venous congestion.  No pneumothorax.  Lung bases poorly evaluated  IMPRESSION: Decreased lung volumes and central venous congestion. Cardiomegaly.   Original Report Authenticated By: Genevive Bi, M.D.    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  Cardiology was consulted for evaluation of abnormal EKG and mildly elevated troponin.   RECOMMENDATIONS: - EKG from earlier today showed some STE in inferior leads that were borderline for meeting criteria for STEMI in pacemaker setting.  However, the EKG has significant motion artifact.  Stat repeat EKG now does not show changes and pt is asymptomatic in terms of chest pain and SOB.  He has mild elevation in cardiac enzymes, which is likely demand ischemia given infection and recent tachycardia in setting of known HF and known CAD.  He does appear slightly fluid up, but would be cautious with diuresis given AKI.  His AKI precludes cardiac cath at this point.  Please start heparin if no contraindications and give aspirin 325 mg.  Work up and treatment of AKI per primary team.  He also had V/Q scan pending given recent complaints of SOB.  Please obtain echo and daily EKGs.   Please obtain OSH records from his cardiologist in [place]; he would potentially like to follow up in [facility] clinic and this can be arranged on discharge.   Thank you for this consultation.  Recs relayed to Dr. Toniann Fail.  Will continue to follow. EKGs were discussed with Dr. [place], on call interventionalist.  Signed: Hilary Hertz, MD Cardiology Fellow 04/09/2013, 11:23 PM

## 2013-04-09 NOTE — Progress Notes (Signed)
8:17 PM 67 yo man who is a kidney transplant recipient, who has had recent back surgery, presents with high fever and tachycardia.  Exam shows cellulitis of the right leg.  He had an episode of hypoglycemia, treated with D50W. Lab workup shows WBC 17000.  Chest x-ray negative.  UA negative.  Lactic acid 1.5.  Call to Dr. Vassie Loll of Pulmonary and Critical Care who advised IV fluids and Antibiotic treatment; he advised that pt could be admitted to [facility]. [name]

## 2013-04-09 NOTE — Progress Notes (Signed)
6:33 PM Pt with fever, tachycardia, recent back surgery.  He says he has painful urination.  Exam shows lungs clear, heart sounds WNL, Abdomen soft and nontender, right leg with swelling and redness, suggesting cellulitis. Lab workup ordered.

## 2013-04-09 NOTE — ED Provider Notes (Signed)
History     CSN: [id number]  Arrival date & time 04/09/13  1733   First MD Initiated Contact with Patient 04/09/13 1742      Chief Complaint  Patient presents with  . Hypoglycemia    (Consider location/radiation/quality/duration/timing/severity/associated sxs/prior treatment) HPI  [name] 67 year old male with a past medical history of for previous kidney transplant, previous MI, ICD and pacemaker placement, CHF, bladder cancer and is status post lumbar laminectomy and discectomy performed by Dr. Gerlene Fee on 03/26/2013.  Patient presents via EMS for hypoglycemia.  Initial inspection patient is having extreme difficulty breathing and a fever of 101.1 degrees Fahrenheit.  Patient states he has had some difficulty breathing over the past 3 days.  He has had increased swelling in his legs bilaterally.  He has not been using any nebulizer treatments at home and is not usually on oxygen.  Patient blood glucose was 39 at initial evaluation by EMS here sent as given and reviewed and speaking and his cbg rose to 72.  The patient denies any back pain, loss of bowel or bladder function.  He is taking his medications as directed.  He is on CellCept for his kidney transplant.   Past Medical History  Diagnosis Date  . Myocardial infarction 04/12/1990  . CHF (congestive heart failure) 01/2012  . Hypertension   . Diabetes mellitus   . ICD (implantable cardiac defibrillator) in place   . Pacemaker 02/15/2012    [employer]  . Pneumonia 2011  . Arthritis   . History of blood transfusion   . Chronic kidney disease     Glomerulonephritis  . Dysrhythmia   . Cancer     Bladder Cancer- 2004 .  SkinCancer- Basal, Squamous, and a few melymona  . Right foot drop     Past Surgical History  Procedure Laterality Date  . Kidney transplant  02/13/1973  . Coronary angioplasty  03/1990  . Skin cancer excision      Numerous  . Eye surgery      Cataract Right Eye  . Insert / replace / remove  pacemaker    . Uretheral transplation      Left to Right (transplanted kidney)  . Skin transplant      Left thigh to Left hand  . Knee arthroscopy  07/25/2012    Procedure: ARTHROSCOPY KNEE;  Surgeon: Cammy Copa, MD;  Location: [facility] OR;  Service: Orthopedics;  Laterality: Left;  Left knee arthroscopy, debridement, cultures  . Lumbar laminectomy/decompression microdiscectomy Bilateral 03/26/2013    Procedure: LUMBAR LAMINECTOMY/DECOMPRESSION MICRODISCECTOMY 1 LEVEL;  Surgeon: Reinaldo Meeker, MD;  Location: [facility];  Service: Neurosurgery;  Laterality: Bilateral;    History reviewed. No pertinent family history.  History  Substance Use Topics  . Smoking status: Former Smoker -- 30 years    Quit date: 02/14/1983  . Smokeless tobacco: Not on file  . Alcohol Use: No      Review of Systems Ten systems reviewed and are negative for acute change, except as noted in the HPI. \  Allergies  Review of patient's allergies indicates no known allergies.  Home Medications   Current Outpatient Rx  Name  Route  Sig  Dispense  Refill  . ALPRAZolam (XANAX) 0.25 MG tablet   Oral   Take 0.25 mg by mouth at bedtime as needed for sleep or anxiety.          Marland Kitchen aspirin EC 81 MG tablet   Oral   Take 81 mg  by mouth every evening.          . carvedilol (COREG) 12.5 MG tablet   Oral   Take 12.5 mg by mouth 2 (two) times daily with a meal.         . fish oil-omega-3 fatty acids 1000 MG capsule   Oral   Take 1 g by mouth daily.         . furosemide (LASIX) 40 MG tablet   Oral   Take 40 mg by mouth daily as needed (if weight is above 173 lbs).         . insulin aspart (NOVOLOG) 100 UNIT/ML injection   Subcutaneous   Inject 6 Units into the skin 3 (three) times daily before meals.         . insulin detemir (LEVEMIR) 100 UNIT/ML injection   Subcutaneous   Inject 40 Units into the skin at bedtime.          . Multiple Vitamin (MULTIVITAMIN WITH MINERALS) TABS   Oral    Take 1 tablet by mouth daily.         . mycophenolate (CELLCEPT) 500 MG tablet   Oral   Take 500 mg by mouth 2 (two) times daily.          . nitroGLYCERIN (NITROSTAT) 0.4 MG SL tablet   Sublingual   Place 0.4 mg under the tongue every 5 (five) minutes as needed for chest pain. x3 doses as needed for chest pain         . oxyCODONE-acetaminophen (PERCOCET/ROXICET) 5-325 MG per tablet   Oral   Take 1 tablet by mouth every 4 (four) hours as needed for pain. For pain         . predniSONE (DELTASONE) 5 MG tablet   Oral   Take 5 mg by mouth 2 (two) times daily.         Marland Kitchen pyridOXINE (VITAMIN B-6) 100 MG tablet   Oral   Take 200 mg by mouth daily.         . ranitidine (ZANTAC) 300 MG tablet   Oral   Take 300 mg by mouth at bedtime.          . simvastatin (ZOCOR) 80 MG tablet   Oral   Take 80 mg by mouth at bedtime.         Marland Kitchen tobramycin (TOBREX) 0.3 % ophthalmic solution   Both Eyes   Place 1 drop into both eyes as needed (for allergies).         . vitamin B-12 (CYANOCOBALAMIN) 250 MCG tablet   Oral   Take 250 mcg by mouth daily.         . vitamin C (ASCORBIC ACID) 500 MG tablet   Oral   Take 500 mg by mouth daily.           BP 159/87  Pulse 99  Temp(Src) 101.1 F (38.4 C) (Oral)  Resp 20  SpO2 99%  Physical Exam  Constitutional: He is oriented to person, place, and time. He appears distressed.  HENT:  Head: Atraumatic.  Eyes: EOM are normal. Pupils are equal, round, and reactive to light. Scleral icterus is present.  Cardiovascular: Normal rate.   Pulmonary/Chest: He is in respiratory distress.  HEENT using accessory muscles to breathe.  He has supraclavicular and intercostal retractions and belly breathing.  No wheezing however air movement is minimal.  Abdominal: Soft. He exhibits no distension. There is no tenderness.  Musculoskeletal: Normal range of  motion.  Surgical incision site does not appear to show infection  Neurological: He is  alert and oriented to person, place, and time.  Skin: Skin is warm and dry.  Diffuse generalized red scaly patches consistent with actinic keratoses he is also diffusely covered with seborrheic keratoses    ED Course  Procedures (including critical care time)  Labs Reviewed  CBC WITH DIFFERENTIAL - Abnormal; Notable for the following:    WBC 17.8 (*)    RBC 3.50 (*)    Hemoglobin 10.4 (*)    HCT 33.1 (*)    RDW 16.9 (*)    Neutrophils Relative % 90 (*)    Lymphocytes Relative 5 (*)    Neutro Abs 16.0 (*)    All other components within normal limits  COMPREHENSIVE METABOLIC PANEL - Abnormal; Notable for the following:    Glucose, Bld 55 (*)    BUN 63 (*)    Creatinine, Ser 2.44 (*)    Total Protein 5.3 (*)    Albumin 2.9 (*)    GFR calc non Af Amer 26 (*)    GFR calc Af Amer 31 (*)    All other components within normal limits  URINALYSIS, ROUTINE W REFLEX MICROSCOPIC - Abnormal; Notable for the following:    APPearance CLOUDY (*)    Hgb urine dipstick TRACE (*)    Protein, ur >300 (*)    All other components within normal limits  PRO B NATRIURETIC PEPTIDE - Abnormal; Notable for the following:    Pro B Natriuretic peptide (BNP) 21940.0 (*)    All other components within normal limits  D-DIMER, QUANTITATIVE - Abnormal; Notable for the following:    D-Dimer, Quant >20.00 (*)    All other components within normal limits  URINE MICROSCOPIC-ADD ON - Abnormal; Notable for the following:    Casts HYALINE CASTS (*)    All other components within normal limits  GLUCOSE, CAPILLARY - Abnormal; Notable for the following:    Glucose-Capillary 26 (*)    All other components within normal limits  COMPREHENSIVE METABOLIC PANEL - Abnormal; Notable for the following:    Glucose, Bld 64 (*)    BUN 62 (*)    Creatinine, Ser 2.45 (*)    Calcium 8.2 (*)    Total Protein 4.3 (*)    Albumin 2.3 (*)    GFR calc non Af Amer 26 (*)    GFR calc Af Amer 30 (*)    All other components within  normal limits  CBC - Abnormal; Notable for the following:    WBC 15.1 (*)    RBC 2.84 (*)    Hemoglobin 8.4 (*)    HCT 26.9 (*)    RDW 17.0 (*)    Platelets 115 (*)    All other components within normal limits  GLUCOSE, CAPILLARY - Abnormal; Notable for the following:    Glucose-Capillary 54 (*)    All other components within normal limits  GLUCOSE, CAPILLARY - Abnormal; Notable for the following:    Glucose-Capillary 106 (*)    All other components within normal limits  GLUCOSE, CAPILLARY - Abnormal; Notable for the following:    Glucose-Capillary 116 (*)    All other components within normal limits  GLUCOSE, CAPILLARY - Abnormal; Notable for the following:    Glucose-Capillary 126 (*)    All other components within normal limits  POCT I-STAT 3, BLOOD GAS (G3+) - Abnormal; Notable for the following:    pH, Arterial 7.464 (*)  pO2, Arterial 138.0 (*)    Bicarbonate 25.3 (*)    All other components within normal limits  POCT I-STAT TROPONIN I - Abnormal; Notable for the following:    Troponin i, poc 0.13 (*)    All other components within normal limits  MRSA PCR SCREENING  CULTURE, BLOOD (ROUTINE X 2)  CULTURE, BLOOD (ROUTINE X 2)  URINE CULTURE  GLUCOSE, CAPILLARY  PROCALCITONIN  LACTIC ACID, PLASMA  GLUCOSE, CAPILLARY  TROPONIN I  TROPONIN I  LACTIC ACID, PLASMA  HEPARIN LEVEL (UNFRACTIONATED)  GLUCOSE, CAPILLARY  GLUCOSE, CAPILLARY  HEPARIN LEVEL (UNFRACTIONATED)  BASIC METABOLIC PANEL  CBC  CG4 I-STAT (LACTIC ACID)   No results found.   No diagnosis found.    MDM  8:35 PM Patient arriving meeting SIRS/SEPSIS. He has apparent cellulitis of the right LE with 3+ pitting edema up to the knees BL, CXR shows some pulmonary vascular congestion. His left LE is erythematous as well UA appears clear. Lactate 1.5 Patient has leukocytosis and anemic to  I have begun the patient on Vanc /zosyn for cellulitis.  Elevated creatinine. Patient transplant was 39  years ago. Patient has some worsening creatinine function but does have ckd level 3 per medical records. Patient is followed by [facility] in [place]. (dr. Edwyna Shell- [phone]) I spoke with Dr. Denton Lank who is on call for the group. He is aware that the patient is present and does not feel that the medications for         Arthor Captain, PA-C 04/10/13 1747

## 2013-04-09 NOTE — ED Notes (Signed)
Attempted to call report x 1  

## 2013-04-09 NOTE — H&P (Signed)
[facility] History and Physical  [name] ZOX:[id number] DOB: [birth year] DOA: 04/09/2013  Referring physician: ER physician. PCP: [name], MD   Chief Complaint: Confusion and mild shortness of breath.  HPI: [name] is a 67 y.o. male with known history of renal transplant who has had a recent low back surgery 2 weeks ago for L3 decompression discectomy was brought to the ER but patient's wife noticed that patient was found to be mildly confused and mildly short of breath. After patient reached ER patient was found to be febrile. Patient's leg particularly the right lower extremity looks erythematous knee below. Patient does have mild erythema on the left side too. Patient's blood sugar was below 50. Initially D50 was given D5 normal saline has been started. As per patient's wife patient was doing fine until yesterday after the surgery. Patient has some right knee pain denies any low back pain at the surgical site does not have any active discharge. Denies any chest pain nausea vomiting abdominal pain diarrhea. Chest x-ray shows congestion and patient's BNP levels are elevated with mildly elevated troponin. EKG shows paced rhythm with ST elevation in the inferior leads. Patient's creatinine is elevated from baseline.  Review of Systems: As presented in the history of presenting illness, rest negative.  Past Medical History  Diagnosis Date  . Myocardial infarction 04/12/1990  . CHF (congestive heart failure) 01/2012  . Hypertension   . Diabetes mellitus   . ICD (implantable cardiac defibrillator) in place   . Pacemaker 02/15/2012    [employer]  . Pneumonia 2011  . Arthritis   . History of blood transfusion   . Chronic kidney disease     Glomerulonephritis  . Dysrhythmia   . Cancer     Bladder Cancer- 2004 .  SkinCancer- Basal, Squamous, and a few melymona  . Right foot drop    Past Surgical History  Procedure Laterality Date  . Kidney transplant  02/13/1973  .  Coronary angioplasty  03/1990  . Skin cancer excision      Numerous  . Eye surgery      Cataract Right Eye  . Insert / replace / remove pacemaker    . Uretheral transplation      Left to Right (transplanted kidney)  . Skin transplant      Left thigh to Left hand  . Knee arthroscopy  07/25/2012    Procedure: ARTHROSCOPY KNEE;  Surgeon: Cammy Copa, MD;  Location: [facility] OR;  Service: Orthopedics;  Laterality: Left;  Left knee arthroscopy, debridement, cultures  . Lumbar laminectomy/decompression microdiscectomy Bilateral 03/26/2013    Procedure: LUMBAR LAMINECTOMY/DECOMPRESSION MICRODISCECTOMY 1 LEVEL;  Surgeon: Reinaldo Meeker, MD;  Location: [facility];  Service: Neurosurgery;  Laterality: Bilateral;   Social History:  reports that he quit smoking about 30 years ago. He does not have any smokeless tobacco history on file. He reports that he does not drink alcohol or use illicit drugs.  lives at home. where does patient live-- can do ADLs. Can patient participate in ADLs?  No Known Allergies  History reviewed. No pertinent family history.    Prior to Admission medications   Medication Sig Start Date End Date Taking? Authorizing Provider  ALPRAZolam Prudy Feeler) 0.25 MG tablet Take 0.25 mg by mouth at bedtime as needed for sleep or anxiety.    Yes Historical Provider, MD  aspirin EC 81 MG tablet Take 81 mg by mouth every evening.    Yes Historical Provider, MD  carvedilol (COREG)  12.5 MG tablet Take 12.5 mg by mouth 2 (two) times daily with a meal.   Yes Historical Provider, MD  fish oil-omega-3 fatty acids 1000 MG capsule Take 1 g by mouth daily.   Yes Historical Provider, MD  furosemide (LASIX) 40 MG tablet Take 40 mg by mouth daily as needed (if weight is above 173 lbs).   Yes Historical Provider, MD  insulin aspart (NOVOLOG) 100 UNIT/ML injection Inject 6 Units into the skin 3 (three) times daily before meals.   Yes Historical Provider, MD  insulin detemir (LEVEMIR) 100 UNIT/ML  injection Inject 40 Units into the skin at bedtime.    Yes Historical Provider, MD  Multiple Vitamin (MULTIVITAMIN WITH MINERALS) TABS Take 1 tablet by mouth daily.   Yes Historical Provider, MD  mycophenolate (CELLCEPT) 500 MG tablet Take 500 mg by mouth 2 (two) times daily.    Yes Historical Provider, MD  nitroGLYCERIN (NITROSTAT) 0.4 MG SL tablet Place 0.4 mg under the tongue every 5 (five) minutes as needed for chest pain. x3 doses as needed for chest pain   Yes Historical Provider, MD  oxyCODONE-acetaminophen (PERCOCET/ROXICET) 5-325 MG per tablet Take 1 tablet by mouth every 4 (four) hours as needed for pain. For pain   Yes Historical Provider, MD  predniSONE (DELTASONE) 5 MG tablet Take 5 mg by mouth 2 (two) times daily.   Yes Historical Provider, MD  pyridOXINE (VITAMIN B-6) 100 MG tablet Take 200 mg by mouth daily.   Yes Historical Provider, MD  ranitidine (ZANTAC) 300 MG tablet Take 300 mg by mouth at bedtime.    Yes Historical Provider, MD  simvastatin (ZOCOR) 80 MG tablet Take 80 mg by mouth at bedtime.   Yes Historical Provider, MD  tobramycin (TOBREX) 0.3 % ophthalmic solution Place 1 drop into both eyes as needed (for allergies).   Yes Historical Provider, MD  vitamin B-12 (CYANOCOBALAMIN) 250 MCG tablet Take 250 mcg by mouth daily.   Yes Historical Provider, MD  vitamin C (ASCORBIC ACID) 500 MG tablet Take 500 mg by mouth daily.   Yes Historical Provider, MD   Physical Exam: Filed Vitals:   04/09/13 1955 04/09/13 2045 04/09/13 2145 04/09/13 2150  BP:  137/67 156/64   Pulse:  94 93   Temp: 101.9 F (38.8 C)   100 F (37.8 C)  TempSrc:    Oral  Resp:  21 22   SpO2:  100% 100%      General:  Well-developed well-nourished.  Eyes:  Anicteric no pallor.  ENT:  No discharge from the ears eyes nose mouth.  Neck:  No mass felt.  Cardiovascular:  S1-S2 heard.  Respiratory:  No rhonchi or crepitations.  Abdomen:  Soft nontender bowel sounds present.  Skin:  Erythema and  both lower extremities more on the right side.  Musculoskeletal:  Bilateral lower extremity edema.  Psychiatric:  Appears normal.  Neurologic:  Alert awake oriented to time place and person. Patient has chronic right foot drop.  Labs on Admission:  Basic Metabolic Panel:  Recent Labs Lab 04/09/13 1855  NA 140  K 4.7  CL 103  CO2 24  GLUCOSE 55*  BUN 63*  CREATININE 2.44*  CALCIUM 9.1   Liver Function Tests:  Recent Labs Lab 04/09/13 1855  AST 29  ALT 20  ALKPHOS 66  BILITOT 0.4  PROT 5.3*  ALBUMIN 2.9*   No results found for this basename: LIPASE, AMYLASE,  in the last 168 hours No results found for this  basename: AMMONIA,  in the last 168 hours CBC:  Recent Labs Lab 04/09/13 1855  WBC 17.8*  NEUTROABS 16.0*  HGB 10.4*  HCT 33.1*  MCV 94.6  PLT 157   Cardiac Enzymes: No results found for this basename: CKTOTAL, CKMB, CKMBINDEX, TROPONINI,  in the last 168 hours  BNP (last 3 results)  Recent Labs  04/09/13 1856  PROBNP 21940.0*   CBG:  Recent Labs Lab 04/09/13 1814 04/09/13 1952 04/09/13 2036  GLUCAP 75 26* 85    Radiological Exams on Admission: Dg Chest Port 1 View  04/09/2013   *RADIOLOGY REPORT*  Clinical Data: Hypoglycemia and shortness of breath  PORTABLE CHEST - 1 VIEW  Comparison: Chest radiograph 03/23/2013  Findings: Stable enlarged cardiac silhouette.  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  There are low lung volumes and central venous congestion.  No pneumothorax.  Lung bases poorly evaluated  IMPRESSION: Decreased lung volumes and central venous congestion. Cardiomegaly.   Original Report Authenticated By: Genevive Bi, M.D.    EKG: Independently reviewed.  Paced rhythm with ST elevation in the inferior leads.  Assessment/Plan Principal Problem:   SIRS (systemic inflammatory response syndrome) Active Problems:   History of renal transplant   Troponin level elevated   Diabetes mellitus   Cellulitis   CAD (coronary artery  disease)   Renal failure (ARF), acute on chronic   1. SIRS probably from cellulitis of the lower extremities - blood cultures and urine cultures have been ordered patient has been started on empiric antibiotics vancomycin and Zosyn. Since patient complains of increased right knee pain I have ordered a CT of the right knee. If patient also had a recent low back surgery CT of the lumbar spine has been ordered. 2. Hypoglycemia - patient is on insulin and hyperglycemia probably precipitated by worsening renal function. We will discontinue patient's insulin at this time patient has been started on D5 normal saline and if not getting better then we will start D. 10. 3. Positive troponins with abnormal EKG - patient is chest pain-free. I have consulted on call cardiologist further recommendations. 4. CHF - patient's chest x-ray and labs and clinical exam sure the patient is in mild CHF but patient also has renal failure and at this time I'm not ordering any Lasix we will closely monitor respiratory status. 5. Acute renal failure in a transplanted kidney - closely follow intake output daily weights. Check urinalysis. May need to consult nephrologist if creatinine does not improve. 6. Hyperlipidemia - continue home medications.  Since patient's D. dimer is significantly elevated Doppler of the lower extremity and VQ scan has been ordered to check for DVT and PE.  Code Status:  Full code.  Family Communication:  Patient's wife at the bedside.  Disposition Plan:  Admit to inpatient.    Zniya Cottone N. [facility] Pager [phone].  If 7PM-7AM, please contact night-coverage [email or web address] Password [facility] 04/09/2013, 11:22 PM

## 2013-04-09 NOTE — ED Notes (Signed)
Per ems: pt family called for altered mental status today, on ems arrival to scene CBG 39 but alert, ems administered instaglucose and then pt ate a sandwich and drank a sweet tea, cbg after was 72. A&Ox4, BP mildly elevated, he states that is normal.

## 2013-04-09 NOTE — ED Notes (Addendum)
Notified dr Ignacia Palma and RN of elevated i-stat troponin of 0.13

## 2013-04-09 NOTE — Progress Notes (Signed)
ANTIBIOTIC CONSULT NOTE - INITIAL  Pharmacy Consult for Vancomycin/Zosyn Indication: rule out sepsis  No Known Allergies  Patient Measurements:   Ht: 6 ft 1 in Wt: 184 lbs (83.6kg)  Vital Signs: Temp: 101.1 F (38.4 C) (05/26 1756) Temp src: Oral (05/26 1756) BP: 159/87 mmHg (05/26 1756) Pulse Rate: 99 (05/26 1756) Intake/Output from previous day:   Intake/Output from this shift:    Labs:  Recent Labs  04/09/13 1855  WBC 17.8*  HGB 10.4*  PLT 157  CREATININE 2.44*   The CrCl is unknown because both a height and weight (above a minimum accepted value) are required for this calculation. No results found for this basename: VANCOTROUGH, Leodis Binet, VANCORANDOM, GENTTROUGH, GENTPEAK, GENTRANDOM, TOBRATROUGH, TOBRAPEAK, TOBRARND, AMIKACINPEAK, AMIKACINTROU, AMIKACIN,  in the last 72 hours   Microbiology: Recent Results (from the past 720 hour(s))  SURGICAL PCR SCREEN     Status: None   Collection Time    03/23/13  2:01 PM      Result Value Range Status   MRSA, PCR NEGATIVE  NEGATIVE Final   Staphylococcus aureus NEGATIVE  NEGATIVE Final   Comment:            The Xpert SA Assay (FDA     approved for NASAL specimens     in patients over 45 years of age),     is one component of     a comprehensive surveillance     program.  Test performance has     been validated by [employer] for patients greater     than or equal to 44 year old.     It is not intended     to diagnose infection nor to     guide or monitor treatment.    Medical History: Past Medical History  Diagnosis Date  . Myocardial infarction 04/12/1990  . CHF (congestive heart failure) 01/2012  . Hypertension   . Diabetes mellitus   . ICD (implantable cardiac defibrillator) in place   . Pacemaker 02/15/2012    [employer]  . Pneumonia 2011  . Arthritis   . History of blood transfusion   . Chronic kidney disease     Glomerulonephritis  . Dysrhythmia   . Cancer     Bladder Cancer- 2004 .   SkinCancer- Basal, Squamous, and a few melymona  . Right foot drop     Medications:  Immunosuppressed with Cellcept and Prednisone   Assessment: 67 y/o kidney transplant patient with recent back surgery who presents with fever and tachycardia. WBC 17.8, LA 1.51, Scr 2.44 with CrCl ~ 30 mL/min, Tmax 101.9, blood and urine cultures ordered.   ED Antibiotics Vancomycin 1000mg  IV x 1 Zosyn 3.375g IV x 1  Goal of Therapy:  Vancomycin trough level 15-20 mcg/ml  Plan:  -Vancomycin 1000 mg IV x 1 to start 5/27 at 2200 -Zosyn 3.375G IV q8h to be infused over 4 hours -Trend WBC, temp, renal function, cultures -Drug levels ASAP given transplant  Abran Duke, PharmD Clinical Pharmacist Phone: [phone] Pager: [phone] 04/09/2013 8:49 PM

## 2013-04-09 NOTE — ED Provider Notes (Signed)
Date: 04/09/2013  Rate:108  Rhythm: sinus tachycardia  QRS Axis: right  Intervals: normal  ST/T Wave abnormalities: ST depressions inferiorly  Conduction Disutrbances:right bundle branch block  Narrative Interpretation: Abnormal EKG--BBB due to paced rhythm.  Old EKG Reviewed: changes noted--rate more rapid than on tracing done5/10/2013.    Carleene Cooper III, MD 04/09/13 [phone]

## 2013-04-10 ENCOUNTER — Inpatient Hospital Stay (HOSPITAL_COMMUNITY): Payer: PRIVATE HEALTH INSURANCE

## 2013-04-10 ENCOUNTER — Ambulatory Visit: Payer: PRIVATE HEALTH INSURANCE | Admitting: Internal Medicine

## 2013-04-10 DIAGNOSIS — I3139 Other pericardial effusion (noninflammatory): Secondary | ICD-10-CM | POA: Diagnosis present

## 2013-04-10 DIAGNOSIS — R7989 Other specified abnormal findings of blood chemistry: Secondary | ICD-10-CM | POA: Insufficient documentation

## 2013-04-10 DIAGNOSIS — J9601 Acute respiratory failure with hypoxia: Secondary | ICD-10-CM | POA: Diagnosis present

## 2013-04-10 DIAGNOSIS — I313 Pericardial effusion (noninflammatory): Secondary | ICD-10-CM | POA: Diagnosis present

## 2013-04-10 DIAGNOSIS — L0291 Cutaneous abscess, unspecified: Secondary | ICD-10-CM

## 2013-04-10 DIAGNOSIS — I319 Disease of pericardium, unspecified: Secondary | ICD-10-CM

## 2013-04-10 DIAGNOSIS — E119 Type 2 diabetes mellitus without complications: Secondary | ICD-10-CM

## 2013-04-10 DIAGNOSIS — Z940 Kidney transplant status: Secondary | ICD-10-CM

## 2013-04-10 DIAGNOSIS — R0602 Shortness of breath: Secondary | ICD-10-CM

## 2013-04-10 DIAGNOSIS — Z9889 Other specified postprocedural states: Secondary | ICD-10-CM

## 2013-04-10 DIAGNOSIS — E860 Dehydration: Secondary | ICD-10-CM | POA: Diagnosis present

## 2013-04-10 LAB — URINE CULTURE
Colony Count: NO GROWTH
Culture: NO GROWTH

## 2013-04-10 LAB — GLUCOSE, CAPILLARY
Glucose-Capillary: 54 mg/dL — ABNORMAL LOW (ref 70–99)
Glucose-Capillary: 73 mg/dL (ref 70–99)
Glucose-Capillary: 106 mg/dL — ABNORMAL HIGH (ref 70–99)
Glucose-Capillary: 116 mg/dL — ABNORMAL HIGH (ref 70–99)
Glucose-Capillary: 179 mg/dL — ABNORMAL HIGH (ref 70–99)
Glucose-Capillary: 240 mg/dL — ABNORMAL HIGH (ref 70–99)

## 2013-04-10 LAB — CBC
Hemoglobin: 8.4 g/dL — ABNORMAL LOW (ref 13.0–17.0)
MCHC: 31.2 g/dL (ref 30.0–36.0)
RDW: 17 % — ABNORMAL HIGH (ref 11.5–15.5)

## 2013-04-10 LAB — COMPREHENSIVE METABOLIC PANEL
AST: 28 U/L (ref 0–37)
CO2: 21 mEq/L (ref 19–32)
Calcium: 8.2 mg/dL — ABNORMAL LOW (ref 8.4–10.5)
Creatinine, Ser: 2.45 mg/dL — ABNORMAL HIGH (ref 0.50–1.35)
GFR calc Af Amer: 30 mL/min — ABNORMAL LOW (ref >90)
GFR calc non Af Amer: 26 mL/min — ABNORMAL LOW (ref >90)

## 2013-04-10 LAB — HEPARIN LEVEL (UNFRACTIONATED)
Heparin Unfractionated: 0.42 IU/mL (ref 0.30–0.70)
Heparin Unfractionated: 0.42 IU/mL (ref 0.30–0.70)

## 2013-04-10 LAB — MRSA PCR SCREENING: MRSA by PCR: NEGATIVE

## 2013-04-10 LAB — TROPONIN I
Troponin I: <0.3 ng/mL (ref <0.30)
Troponin I: <0.3 ng/mL (ref <0.30)

## 2013-04-10 MED ORDER — TECHNETIUM TC 99M DIETHYLENETRIAME-PENTAACETIC ACID: 40.0000 | Freq: Once | INTRAVENOUS | Status: AC | PRN

## 2013-04-10 MED ORDER — DEXTROSE-NACL 5-0.45 % IV SOLN
INTRAVENOUS | Status: DC
  Administered 2013-04-10: via INTRAVENOUS

## 2013-04-10 MED ORDER — COLCHICINE 0.6 MG PO TABS
0.6000 mg | ORAL_TABLET | Freq: Every day | ORAL | Status: DC
  Administered 2013-04-10 – 2013-04-13 (×4): 0.6 mg via ORAL
  Filled 2013-04-10 (×5): qty 1

## 2013-04-10 MED ORDER — ALBUTEROL SULFATE (5 MG/ML) 0.5% IN NEBU
2.5000 mg | INHALATION_SOLUTION | RESPIRATORY_TRACT | Status: DC | PRN
Start: 2013-04-10 — End: 2013-04-23

## 2013-04-10 MED ORDER — SODIUM CHLORIDE 0.9 % IV BOLUS (SEPSIS)
500.0000 mL | Freq: Once | INTRAVENOUS | Status: AC
  Administered 2013-04-10: 500 mL via INTRAVENOUS

## 2013-04-10 MED ORDER — ENOXAPARIN SODIUM 40 MG/0.4ML ~~LOC~~ SOLN
40.0000 mg | SUBCUTANEOUS | Status: DC
  Administered 2013-04-10 – 2013-04-15 (×6): 40 mg via SUBCUTANEOUS
  Filled 2013-04-10 (×7): qty 0.4

## 2013-04-10 MED ORDER — TECHNETIUM TO 99M ALBUMIN AGGREGATED
6.0000 | Freq: Once | INTRAVENOUS | Status: AC | PRN
  Administered 2013-04-10: 6 via INTRAVENOUS

## 2013-04-10 MED ORDER — IPRATROPIUM BROMIDE 0.02 % IN SOLN: 0.5000 mg | RESPIRATORY_TRACT | Status: DC

## 2013-04-10 MED ORDER — ASPIRIN 81 MG PO CHEW
324.0000 mg | CHEWABLE_TABLET | Freq: Once | ORAL | Status: AC
  Administered 2013-04-10: 324 mg via ORAL
  Filled 2013-04-10: qty 4

## 2013-04-10 MED ORDER — ALBUTEROL SULFATE (5 MG/ML) 0.5% IN NEBU
2.5000 mg | INHALATION_SOLUTION | Freq: Two times a day (BID) | RESPIRATORY_TRACT | Status: DC
  Administered 2013-04-10 – 2013-04-13 (×8): 2.5 mg via RESPIRATORY_TRACT
  Filled 2013-04-10 (×8): qty 0.5

## 2013-04-10 MED ORDER — DEXTROSE 10 % IV SOLN
INTRAVENOUS | Status: DC
  Administered 2013-04-10: 21:00:00 via INTRAVENOUS
  Administered 2013-04-10: 50 mL/h via INTRAVENOUS

## 2013-04-10 MED ORDER — HEPARIN (PORCINE) IN NACL 100-0.45 UNIT/ML-% IJ SOLN
1000.0000 [IU]/h | INTRAMUSCULAR | Status: DC
  Administered 2013-04-10: 1000 [IU]/h via INTRAVENOUS
  Filled 2013-04-10 (×2): qty 250

## 2013-04-10 MED ORDER — SODIUM CHLORIDE 0.9 % IV SOLN
INTRAVENOUS | Status: DC
  Administered 2013-04-10: via INTRAVENOUS
  Administered 2013-04-10: 1000 mL via INTRAVENOUS

## 2013-04-10 MED ORDER — ALBUTEROL SULFATE (5 MG/ML) 0.5% IN NEBU: 2.5000 mg | INHALATION_SOLUTION | Freq: Two times a day (BID) | RESPIRATORY_TRACT | Status: DC

## 2013-04-10 MED ORDER — IPRATROPIUM BROMIDE 0.02 % IN SOLN
0.5000 mg | Freq: Two times a day (BID) | RESPIRATORY_TRACT | Status: DC
  Administered 2013-04-10 – 2013-04-13 (×8): 0.5 mg via RESPIRATORY_TRACT
  Filled 2013-04-10 (×8): qty 2.5

## 2013-04-10 MED ORDER — HEPARIN BOLUS VIA INFUSION
4000.0000 [IU] | Freq: Once | INTRAVENOUS | Status: AC
  Administered 2013-04-10: 4000 [IU] via INTRAVENOUS
  Filled 2013-04-10: qty 4000

## 2013-04-10 NOTE — Progress Notes (Signed)
ANTICOAGULATION CONSULT NOTE - Follow Up Consult  Pharmacy Consult for Heparin Indication: Rule out chest pain/ACS and rule-out pulmonary embolus   No Known Allergies  Patient Measurements: Height: 6\' 1"  (185.4 cm) Weight: 195 lb 15.8 oz (88.9 kg) IBW/kg (Calculated) : 79.9  Vital Signs: Temp: 98.7 F (37.1 C) (05/27 0820) Temp src: Oral (05/27 0820) BP: 130/68 mmHg (05/27 0820) Pulse Rate: 79 (05/27 0820)  Labs:  Recent Labs  04/09/13 1855 04/10/13 0200 04/10/13 0845  HGB 10.4*  --  8.4*  HCT 33.1*  --  26.9*  PLT 157  --  PENDING  HEPARINUNFRC  --   --  0.42  CREATININE 2.44* 2.45*  --   TROPONINI  --  <0.30  --     Estimated Creatinine Clearance: 34.4 ml/min (by C-G formula based on Cr of 2.45).   Medications:  Infusions:  . sodium chloride 1,000 mL (04/10/13 0745)  . dextrose 50 mL/hr (04/10/13 0000)  . heparin 1,000 Units/hr (04/10/13 0600)    Assessment: 67 year old male with chest pain receiving anticoagulation with Heparin for r/o ACS, r/o PE.  His initial heparin level is therapeutic.  His hemoglobin is down 2g/dL from admission and his current platelet count is still in process.  Note his platelets are on the lower end of the normal range at baseline (157-189K).  No active bleeding noted.  Goal of Therapy:  Heparin level 0.3-0.7 units/ml Monitor platelets by anticoagulation protocol: Yes   Plan:  Continue Heparin at 1000 units/hr Recheck Heparin level in 6 hours to confirm Follow-up pending platelet count.  Madolyn Frieze 04/10/2013,10:05 AM

## 2013-04-10 NOTE — Progress Notes (Signed)
  Echocardiogram 2D Echocardiogram has been performed.  Alois Mincer FRANCES 04/10/2013, 3:37 PM

## 2013-04-10 NOTE — Progress Notes (Signed)
Merdis Delay NP updated on pt status. Made aware of temp 101.3 and CBG 74 on D10 at 50cc/hr. Informed that pt had received 324mg  ASA an hour ago. Instructed to administer Tylenol 650mg  for temp elevation.

## 2013-04-10 NOTE — Progress Notes (Signed)
VASCULAR LAB PRELIMINARY  PRELIMINARY  PRELIMINARY  PRELIMINARY  Bilateral lower extremity venous duplex  completed.    Preliminary report:  Bilateral:  No evidence of DVT, superficial thrombosis, or [name]'s Cyst.    Roddrick Sharron, RVT 04/10/2013, 2:43 PM

## 2013-04-10 NOTE — Progress Notes (Signed)
Pt returned from CT at approximately 1155.  Pt was alert and oriented, with complaints or concerns.  Nurse was able to obtain pedal pulses via doppler.  CBG was obtained once pt arrived back on floor; 126.  Nurse will continue to monitor.

## 2013-04-10 NOTE — Progress Notes (Addendum)
ANTICOAGULATION CONSULT NOTE - Follow Up Consult  Pharmacy Consult for Heparin Indication: Rule out chest pain/ACS and rule-out pulmonary embolus   No Known Allergies  Patient Measurements: Height: 6\' 1"  (185.4 cm) Weight: 195 lb 15.8 oz (88.9 kg) IBW/kg (Calculated) : 79.9  Vital Signs: Temp: 98.5 F (36.9 C) (05/27 1145) Temp src: Oral (05/27 1145) BP: 138/74 mmHg (05/27 1703) Pulse Rate: 88 (05/27 1703)  Labs:  Recent Labs  04/09/13 1855 04/10/13 0200 04/10/13 0845 04/10/13 0958 04/10/13 1535  HGB 10.4*  --  8.4*  --   --   HCT 33.1*  --  26.9*  --   --   PLT 157  --  115*  --   --   HEPARINUNFRC  --   --  0.42  --  0.42  CREATININE 2.44* 2.45*  --   --   --   TROPONINI  --  <0.30  --  <0.30  --     Estimated Creatinine Clearance: 34.4 ml/min (by C-G formula based on Cr of 2.45).   Medications:  Infusions:  . sodium chloride 1,000 mL (04/10/13 0745)  . dextrose 50 mL/hr (04/10/13 0000)    Assessment: 2nd Heparin level = 0.42 on 1000 units/hr in this 67 year old male on anticoagulation with Heparin for r/o ACS, r/o PE.   PLTC = 115K , down from 157K .  No active bleeding noted.  Goal of Therapy:  Heparin level 0.3-0.7 units/ml Monitor platelets by anticoagulation protocol: Yes   Plan:  Continue Heparin at 1000 units/hr Recheck Heparin level & CBC in AM with daily labs.    Noah Delaine, RPh Clinical Pharmacist Pager: [phone] 04/10/2013,5:23 PM  Addendum:  Heparin recently discontinued per A. Rennis Harding, NP and changed to daily Lovenox since no indication of acute cardiac ischemia nor pulmonary embolus.  Will cancel the heparin pharmacy consult and daily heparin levels, CBC.   Noah Delaine, RPh Clinical Pharmacist Pager: [phone] 04/10/2013 5:32 PM

## 2013-04-10 NOTE — Progress Notes (Signed)
Utilization Review Completed. 04/10/2013  

## 2013-04-10 NOTE — Progress Notes (Signed)
Patient Name: [name] Date of Encounter: 04/10/2013    Principal Problem:   SIRS (systemic inflammatory response syndrome) Active Problems:   Acute respiratory failure with hypoxia   Pericardial effusion   History of renal transplant   HTN (hypertension)   Diabetes mellitus type 2, controlled   Cellulitis of right lower extremity   CAD (coronary artery disease)   Renal failure (ARF), acute on chronic kidney disease stage 3   Hx of decompressive lumbar laminectomy (L-3) May12, 2014   Dehydration   SUBJECTIVE  No chest pain, sob.  Though initial POC troponin was elevated, subsequent troponins have been normal.  Anemic this AM.  Heparin d/c'd in light of nl troponins and low prob V:Q scan.  Echo done today and shows biV failure with large pericardial effusion.  He has had DOE since his surgery but has felt that that was 2/2 deconditioning.  That said, his weight, which is normally 172-174 lbs on his home scale has increased to 182 on his home scale (195 here).  He has been taking lasix 40mg  daily over the past few wks due to weight gain, which hasn't been coming off.  He usually takes lasix on a prn basis only for weight gain.  CURRENT MEDS . ipratropium  0.5 mg Nebulization BID   And  . albuterol  2.5 mg Nebulization BID  . aspirin EC  325 mg Oral Daily  . atorvastatin  40 mg Oral q1800  . carvedilol  12.5 mg Oral BID WC  . enoxaparin (LOVENOX) injection  40 mg Subcutaneous Q24H  . famotidine  20 mg Oral Daily  . mycophenolate  500 mg Oral BID  . omega-3 acid ethyl esters  1 g Oral Daily  . piperacillin-tazobactam (ZOSYN)  IV  3.375 g Intravenous Q8H  . predniSONE  5 mg Oral BID  . pyridOXINE  200 mg Oral Daily  . sodium chloride  3 mL Intravenous Q12H  . vancomycin  1,000 mg Intravenous Q24H  . vitamin B-12  250 mcg Oral Daily  . vitamin C  500 mg Oral Daily   OBJECTIVE  Filed Vitals:   04/10/13 0820 04/10/13 1145 04/10/13 1300 04/10/13 1500  BP: 130/68 130/69  134/74 137/73  Pulse: 79 78 77 82  Temp: 98.7 F (37.1 C) 98.5 F (36.9 C)    TempSrc: Oral Oral    Resp: 14 18 19 19   Height:      Weight:      SpO2: 100% 100% 100%     Intake/Output Summary (Last 24 hours) at 04/10/13 1615 Last data filed at 04/10/13 1500  Gross per 24 hour  Intake 1720.33 ml  Output    500 ml  Net 1220.33 ml   Filed Weights   04/09/13 2310 04/10/13 0630  Weight: 194 lb 3.6 oz (88.1 kg) 195 lb 15.8 oz (88.9 kg)   PHYSICAL EXAM  General: Pleasant, NAD. Neuro: Alert and oriented X 3. Moves all extremities spontaneously. Psych: Normal affect. HEENT:  Normal  Neck: Supple without bruits.  Mod elevated JVP. Lungs:  Resp regular and unlabored, CTA. Heart: RRR no s3, s4, 2/6 syst murmur @ apex with rub. Abdomen: Soft, non-tender, non-distended, BS + x 4.  Extremities: No clubbing, cyanosis.  1+ bilat LEE with erythema/cellulitis of the RLE.  DP/PT/Radials 1+ and equal bilaterally.  Accessory Clinical Findings  CBC  Recent Labs  04/09/13 1855 04/10/13 0845  WBC 17.8* 15.1*  NEUTROABS 16.0*  --   HGB 10.4* 8.4*  HCT 33.1* 26.9*  MCV 94.6 94.7  PLT 157 115*   Basic Metabolic Panel  Recent Labs  04/09/13 1855 04/10/13 0200  NA 140 139  K 4.7 5.0  CL 103 104  CO2 24 21  GLUCOSE 55* 64*  BUN 63* 62*  CREATININE 2.44* 2.45*  CALCIUM 9.1 8.2*   Liver Function Tests  Recent Labs  04/09/13 1855 04/10/13 0200  AST 29 28  ALT 20 16  ALKPHOS 66 57  BILITOT 0.4 0.3  PROT 5.3* 4.3*  ALBUMIN 2.9* 2.3*   Cardiac Enzymes  Recent Labs  04/10/13 0200 04/10/13 0958  TROPONINI <0.30 <0.30   D-Dimer  Recent Labs  04/09/13 1856  DDIMER >20.00*   TELE  Sinus/pod.  ECG  V-paced, 78, no acute changes.  2D Echocardiogram 5.27.2014 Study Conclusions  - Left ventricle: The cavity size was moderately dilated.   There was mild focal basal hypertrophy of the septum.   Systolic function was moderately to severely reduced. The    estimated ejection fraction was in the range of 30% to   35%. There is akinesis of the inferior posterior   myocardium. There is hypokinesis of the lateral   myocardium. Features are consistent with a pseudonormal   left ventricular filling pattern, with concomitant   abnormal relaxation and increased filling pressure (grade   2 diastolic dysfunction). - Aortic valve: Trivial regurgitation. - Mitral valve: Moderate regurgitation. - Left atrium: The atrium was moderately dilated. - Right ventricle: The cavity size was mildly dilated.   Systolic function was severely reduced. - Right atrium: The atrium was mildly dilated. - Pulmonary arteries: PA peak pressure: 39mm Hg (S). - Pericardium, extracardiac: A large pericardial effusion   was identified with mild RA collapse; no RV   diastolic collapse; IVC mildly dilated with blunted   response to inspiration. I_____________  Bilateral LE U/S 5.27.2014  Bilateral lower extremity venous duplex  completed.    Preliminary report:  Bilateral:  No evidence of DVT, superficial thrombosis, or [name]'s Cyst. _____________  Radiology/Studies   CT Lumbar Spine Wo Contrast   IMPRESSION:  1.  Interval postsurgical changes at L3-L4. An epidural air fluid collection extends inferiorly from the disc space behind the L4 vertebral body and exerts mass effect on the thecal sac.  As this appears contiguous with the L3-L4 disc which demonstrates vacuum phenomenon, this may reflect an extruded disc fragment.  I cannot exclude an epidural abscess, although no generalized inflammatory changes are identified. There is persistent foraminal narrowing at L3-L4, worse on the left. 2.  No CT evidence of osteomyelitis. 3.  Stable disc protrusions at L4-L5 and L5-S1. 4.  Apparent hydronephrosis of the native right renal collecting system of uncertain significance and etiology.   Original Report Authenticated By: Carey Bullocks, M.D.   Ct Knee Right Wo  Contrast  04/10/2013   *RADIOLOGY REPORT*  Clinical Data: Knee swelling and fever.  Question osteomyelitis.  CT OF THE RIGHT KNEE WITHOUT CONTRAST   IMPRESSION:  1.  No CT evidence of osteomyelitis.  Please note that CT scan is not sensitive for the detection of osteomyelitis. 2.  Small joint effusion.  No focal fluid collection is present. 3.  Subcutaneous edema diffusely about the knee is likely due to dependent change or possibly cellulitis. 4.  Atherosclerosis.   Original Report Authenticated By: Holley Dexter, M.D.   Nm Pulmonary Perf And Vent  04/10/2013   *RADIOLOGY REPORT*  Clinical Data:  Shortness of breath, history hypertension, diabetes, coronary  artery disease post MI and coronary angioplasty, CHF  NUCLEAR MEDICINE VENTILATION - PERFUSION LUNG SCAN  IMPRESSION: Very low probability for pulmonary embolism.   Original Report Authenticated By: [name], M.D.   Dg Chest Port 1 View  04/09/2013   *RADIOLOGY REPORT*  Clinical Data: Hypoglycemia and shortness of breath  PORTABLE CHEST - 1 VIEW   IMPRESSION: Decreased lung volumes and central venous congestion. Cardiomegaly.   Original Report Authenticated By: Genevive Bi, M.D.    ASSESSMENT AND PLAN  1.  Large pericardial effusion:  No evidence of tamponade.  He has had DOE in setting of recovery from lumbar surgery and weight gain/edema, though has not had chest pain, orthopnea, or dyspnea at rest.  He is not tachycardic currently.  We will add colchicine 0.6mg  daily.  If he has any clinical compromise, would repeat echo and ask for TCTS eval.  Otherwise, would plan to repeat echo w/in 5-7 days, avoid volume depletion, and continue to treat systemic illness.  2.  Sirs/Cellulitis:  Abx per IM.  3.  Acute on chronic systolic CHF/cardiomyopathy/CAD:  He is s/p PTCA x 2 in 1991 and then arrest last May with cath (no intervention) followed by BiV ICD placement.  He has been having weight gain, edema, and DOE.  No orthopnea, pnd.  He does  have evidence of volume overload on exam but is not clinically in heart failure.  He had been taking lasix 40mg  daily prn for weight gain.  Continue to watch volume status closely, especially as he has received IVF.  With #1, wish to avoid volume depletion so we will not diurese at this time.  Consider renal consult if IV diuresis becomes necessary.  Cont bb, asa, statin.  No acei/arb 2/2 CKD.  4.  CKD s/p transplant:  Creat 2.45.  He says that this normally runs 2.6-2.7 since last year, thus he is at baseline.  Cont cellcept/prednisone.  5.  Lumbar/L Leg Pain:  Was taking prn percocet @ home - resume.  6.  HTN:  Stable.  7.  DM: per IM.  8.  Anemia:  Heparin off.  Signed, Nicolasa Ducking NP Patient examined and agree with above. Will initiate colcichine for 2 weeks with follow up Echo. Discussed with wife and patient. Gave my cell number for Dr Lady Gary to call for continuity.  Valera Castle, MD 04/10/2013 6:13 PM

## 2013-04-10 NOTE — Progress Notes (Signed)
[facility] Progress Note [facility] TEAM 1 - Stepdown/ICU TEAM   FREEDOM PEDDY ZOX:[id number] DOB: 24-Nov-1945 DOA: 04/09/2013 PCP: [name], MD  Brief narrative: 67 year old male patient on chronic immunosuppression after renal transplant remotely. Had lumbar decompression surgery 2 weeks prior. Patient's wife brought him to the ER because he was confused and short of breath. In the ER patient was febrile and he was noted to have right lower sternum the erythematous changes that appear to be consistent with cellulitis. Patient has a prior history of left septic knee and had been followed by ID at that time. Patient is also hypoglycemic with a blood sugar of around 50. Evidence of 24 hours prior to presenting to the ER patient had been doing well since his surgery. He had been having right knee pain but denied any back pain at the surgical site and had not had any drainage from the surgical site. Chest x-ray showed possible central vascular congestion the patient had mildly elevated troponin. He also had a paced rhythm with a bundle branch block. He also appeared to be in acute renal failure.  Assessment/Plan: Active Problems:   SIRS (systemic inflammatory response syndrome) -likely source RLE cellulitis -CT lumbar spine ? Possible abscess (epidural air fluid collection) but no gen. Inflammatory changes -cont empiric anbx's and supportive care; follow all cx's    Cellulitis of right lower extremity -CT knee without osteo but MRI preferred imaging -tiny joint effusion and SQ edema but no focal fluid collection -cont empiric anbx's    Renal failure (ARF), acute on chronic kidney disease stage 3/Dehydration -baseline BUN/Cr 33/1.68 -cont IVF hydration with NS at 100/hr -hold Lasix    Demand ischemia/CAD (coronary artery disease) -subsequent enzymes have been normal and no CP -cont Coreg and ASA -apparent h/o CHF- ECHO pending    Positive D dimer -VQ scan with VERY low probability  for PE     History of renal transplant -cont Prednisone and Cellcept for now    Acute respiratory failure with hypoxia   -improved and weaning oxygen -suspect was treated for tachypnea by increasing O2-tachypnea likely due to [facility] and met acidosis which is improving    PVD -has purple discolored areas to left GT and bases of toes left foot and foot cool to touch -doppler pulse present -pt endorsed vascular studies 4 months ago at [place] -follow    HTN (hypertension) -BP controlled but soft -cont home meds    Diabetes mellitus type 2, controlled/hypoglycemia -Levemir at home on hold -cont D10 until CBG higher   Hx of decompressive lumbar laminectomy (L-3) May12, 2014 -? Of epidural abscess post op-this area is exerting mass effect on the thecal sac -no back pain endorsed but will notify NS/K[facility] of admit and CT results-staff to give Dr Gerlene Fee message to please review recent CT lumbar spine   DVT prophylaxis: Full dose heparin -change to daily Lovenox since no indication of acute cardiac ischemia nor pulmonary embolus Code Status: Full Family Communication: Patient Disposition Plan: Stepdown Isolation: None  Consultants: Dr. Gerlene Fee with neurosurgery Cardiology  Procedures: 2-D echocardiogram pending Lower extremity venous duplex negative for DVT  Antibiotics: Zosyn 5/26 >>> Vancomycin 5/26 >>>  HPI/Subjective: Patient alert and denies any leg or back pain. No shortness of breath or chest pain.   Objective: Blood pressure 134/74, pulse 77, temperature 98.5 F (36.9 C), temperature source Oral, resp. rate 19, height 6\' 1"  (1.854 m), weight 88.9 kg (195 lb 15.8 oz), SpO2 100.00%.  Intake/Output Summary (Last 24  hours) at 04/10/13 1438 Last data filed at 04/10/13 1300  Gross per 24 hour  Intake 1370.33 ml  Output    500 ml  Net 870.33 ml     Exam: General: No acute respiratory distress, MAXIMUM TEMPERATURE 101.3 since admission Lungs: Clear to  auscultation bilaterally without wheezes or crackles, 4 L oxygen Cardiovascular: Regular rate and ventricular paced rhythm without murmur gallop or rub normal S1 and S2, no JVD but does have 2-3+ bilateral pitting edema worse on the left foot; left foot noted with irregular pattern of purple blue discoloration to great toe extending from tip of the toe to base of toes also similar discoloration to the rest of the toes of the same foot foot is cool to touch and somewhat pale but pulses present with Doppler. Abdomen: Nontender, nondistended, soft, bowel sounds positive, no rebound, no ascites, no appreciable mass Musculoskeletal: No significant cyanosis, clubbing of bilateral lower extremities Neurological: Alert and oriented x 3, moves all extremities x 4 without focal neurological deficits, CN 2-12 intact Integumentary: Patient has diffuse papular scaly lesions throughout entire body that he reports is related to his chronic prednisone use for greater than 20 years; has definite erythematous skin changes on the right lower extremity extending from the foot to the thigh more defined below the knee in these areas are tender to touch mildly, to a lesser degree has erythematous changes on the left lower extremity mainly involving the calf.  Scheduled Meds: Scheduled Meds: . ipratropium  0.5 mg Nebulization BID   And  . albuterol  2.5 mg Nebulization BID  . aspirin EC  325 mg Oral Daily  . atorvastatin  40 mg Oral q1800  . carvedilol  12.5 mg Oral BID WC  . famotidine  20 mg Oral Daily  . mycophenolate  500 mg Oral BID  . omega-3 acid ethyl esters  1 g Oral Daily  . piperacillin-tazobactam (ZOSYN)  IV  3.375 g Intravenous Q8H  . predniSONE  5 mg Oral BID  . pyridOXINE  200 mg Oral Daily  . sodium chloride  3 mL Intravenous Q12H  . vancomycin  1,000 mg Intravenous Q24H  . vitamin B-12  250 mcg Oral Daily  . vitamin C  500 mg Oral Daily   Continuous Infusions: . sodium chloride 1,000 mL (04/10/13  0745)  . dextrose 50 mL/hr (04/10/13 0000)  . heparin 1,000 Units/hr (04/10/13 0600)    Data Reviewed: Basic Metabolic Panel:  Recent Labs Lab 04/09/13 1855 04/10/13 0200  NA 140 139  K 4.7 5.0  CL 103 104  CO2 24 21  GLUCOSE 55* 64*  BUN 63* 62*  CREATININE 2.44* 2.45*  CALCIUM 9.1 8.2*   Liver Function Tests:  Recent Labs Lab 04/09/13 1855 04/10/13 0200  AST 29 28  ALT 20 16  ALKPHOS 66 57  BILITOT 0.4 0.3  PROT 5.3* 4.3*  ALBUMIN 2.9* 2.3*   No results found for this basename: LIPASE, AMYLASE,  in the last 168 hours No results found for this basename: AMMONIA,  in the last 168 hours CBC:  Recent Labs Lab 04/09/13 1855 04/10/13 0845  WBC 17.8* 15.1*  NEUTROABS 16.0*  --   HGB 10.4* 8.4*  HCT 33.1* 26.9*  MCV 94.6 94.7  PLT 157 115*   Cardiac Enzymes:  Recent Labs Lab 04/10/13 0200 04/10/13 0958  TROPONINI <0.30 <0.30   BNP (last 3 results)  Recent Labs  04/09/13 1856  PROBNP 21940.0*   CBG:  Recent Labs Lab  04/10/13 0144 04/10/13 0329 04/10/13 0641 04/10/13 0815 04/10/13 1155  GLUCAP 73 86 106* 116* 126*    Recent Results (from the past 240 hour(s))  MRSA PCR SCREENING     Status: None   Collection Time    04/10/13 12:04 AM      Result Value Range Status   MRSA by PCR NEGATIVE  NEGATIVE Final   Comment:            The GeneXpert MRSA Assay (FDA     approved for NASAL specimens     only), is one component of a     comprehensive MRSA colonization     surveillance program. It is not     intended to diagnose MRSA     infection nor to guide or     monitor treatment for     MRSA infections.     Studies:  Recent x-ray studies have been reviewed in detail by the Attending Physician  Scheduled Meds:  Reviewed in detail by the Attending Physician   Junious Silk, ANP [facility] Office  [phone] Pager [phone]  On-Call/Text Page:      [email or web address]      password [email or web address]  If 7PM-7AM, please contact  night-coverage [email or web address] Password [facility] 04/10/2013, 2:38 PM   LOS: 1 day   I have examined the patient, reviewed the chart and modified the above note which I agree with.   Jonathon Castelo,MD [phone] 04/10/2013, 6:53 PM

## 2013-04-10 NOTE — Progress Notes (Signed)
ANTICOAGULATION CONSULT NOTE - Initial Consult  Pharmacy Consult for heparin Indication: chest pain/ACS  No Known Allergies  Patient Measurements: Heparin Dosing Weight: 80kg  Vital Signs: Temp: 100 F (37.8 C) (05/26 2150) Temp src: Oral (05/26 2150) BP: 156/64 mmHg (05/26 2145) Pulse Rate: 93 (05/26 2145)  Labs:  Recent Labs  04/09/13 1855  HGB 10.4*  HCT 33.1*  PLT 157  CREATININE 2.44*    Medical History: Past Medical History  Diagnosis Date  . Myocardial infarction 04/12/1990  . CHF (congestive heart failure) 01/2012  . Hypertension   . Diabetes mellitus   . ICD (implantable cardiac defibrillator) in place   . Pacemaker 02/15/2012    [employer]  . Pneumonia 2011  . Arthritis   . History of blood transfusion   . Chronic kidney disease     Glomerulonephritis  . Dysrhythmia   . Cancer     Bladder Cancer- 2004 .  SkinCancer- Basal, Squamous, and a few melymona  . Right foot drop     Medications:  Prescriptions prior to admission  Medication Sig Dispense Refill  . ALPRAZolam (XANAX) 0.25 MG tablet Take 0.25 mg by mouth at bedtime as needed for sleep or anxiety.       Marland Kitchen aspirin EC 81 MG tablet Take 81 mg by mouth every evening.       . carvedilol (COREG) 12.5 MG tablet Take 12.5 mg by mouth 2 (two) times daily with a meal.      . fish oil-omega-3 fatty acids 1000 MG capsule Take 1 g by mouth daily.      . furosemide (LASIX) 40 MG tablet Take 40 mg by mouth daily as needed (if weight is above 173 lbs).      . insulin aspart (NOVOLOG) 100 UNIT/ML injection Inject 6 Units into the skin 3 (three) times daily before meals.      . insulin detemir (LEVEMIR) 100 UNIT/ML injection Inject 40 Units into the skin at bedtime.       . Multiple Vitamin (MULTIVITAMIN WITH MINERALS) TABS Take 1 tablet by mouth daily.      . mycophenolate (CELLCEPT) 500 MG tablet Take 500 mg by mouth 2 (two) times daily.       . nitroGLYCERIN (NITROSTAT) 0.4 MG SL tablet Place 0.4 mg  under the tongue every 5 (five) minutes as needed for chest pain. x3 doses as needed for chest pain      . oxyCODONE-acetaminophen (PERCOCET/ROXICET) 5-325 MG per tablet Take 1 tablet by mouth every 4 (four) hours as needed for pain. For pain      . predniSONE (DELTASONE) 5 MG tablet Take 5 mg by mouth 2 (two) times daily.      Marland Kitchen pyridOXINE (VITAMIN B-6) 100 MG tablet Take 200 mg by mouth daily.      . ranitidine (ZANTAC) 300 MG tablet Take 300 mg by mouth at bedtime.       . simvastatin (ZOCOR) 80 MG tablet Take 80 mg by mouth at bedtime.      Marland Kitchen tobramycin (TOBREX) 0.3 % ophthalmic solution Place 1 drop into both eyes as needed (for allergies).      . vitamin B-12 (CYANOCOBALAMIN) 250 MCG tablet Take 250 mcg by mouth daily.      . vitamin C (ASCORBIC ACID) 500 MG tablet Take 500 mg by mouth daily.       Scheduled:  . ipratropium  0.5 mg Nebulization Q4H   And  . albuterol  2.5 mg Nebulization  Q4H  . aspirin EC  325 mg Oral Daily  . atorvastatin  40 mg Oral q1800  . carvedilol  12.5 mg Oral BID WC  . famotidine  20 mg Oral Daily  . heparin  5,000 Units Subcutaneous Q8H  . mycophenolate  500 mg Oral BID  . omega-3 acid ethyl esters  1 g Oral Daily  . piperacillin-tazobactam (ZOSYN)  IV  3.375 g Intravenous Q8H  . predniSONE  5 mg Oral BID  . pyridOXINE  200 mg Oral Daily  . sodium chloride  3 mL Intravenous Q12H  . vancomycin  1,000 mg Intravenous Q24H  . vitamin B-12  250 mcg Oral Daily  . vitamin C  500 mg Oral Daily    Assessment: 67yo male c/o off CBG readings, EKG showed some STE borderline for meeting STEMI criteria with pacemaker, denies Sx similar to prior MI, to begin heparin for r/o ACS.  Goal of Therapy:  Heparin level 0.3-0.7 units/ml Monitor platelets by anticoagulation protocol: Yes   Plan:  Will give heparin 4000 units IV bolus x1 followed by gtt at 1000 units/hr and monitor heparin levels and CBC.  Vernard Gambles, PharmD, BCPS  04/10/2013,12:05 AM

## 2013-04-11 ENCOUNTER — Inpatient Hospital Stay (HOSPITAL_COMMUNITY): Payer: PRIVATE HEALTH INSURANCE

## 2013-04-11 DIAGNOSIS — I248 Other forms of acute ischemic heart disease: Secondary | ICD-10-CM

## 2013-04-11 DIAGNOSIS — I319 Disease of pericardium, unspecified: Secondary | ICD-10-CM

## 2013-04-11 DIAGNOSIS — I2510 Atherosclerotic heart disease of native coronary artery without angina pectoris: Secondary | ICD-10-CM

## 2013-04-11 LAB — BASIC METABOLIC PANEL
CO2: 19 mEq/L (ref 19–32)
Calcium: 7.9 mg/dL — ABNORMAL LOW (ref 8.4–10.5)
Chloride: 103 mEq/L (ref 96–112)
Creatinine, Ser: 2.38 mg/dL — ABNORMAL HIGH (ref 0.50–1.35)
Glucose, Bld: 208 mg/dL — ABNORMAL HIGH (ref 70–99)

## 2013-04-11 LAB — GLUCOSE, CAPILLARY
Glucose-Capillary: 191 mg/dL — ABNORMAL HIGH (ref 70–99)
Glucose-Capillary: 192 mg/dL — ABNORMAL HIGH (ref 70–99)
Glucose-Capillary: 193 mg/dL — ABNORMAL HIGH (ref 70–99)
Glucose-Capillary: 211 mg/dL — ABNORMAL HIGH (ref 70–99)
Glucose-Capillary: 239 mg/dL — ABNORMAL HIGH (ref 70–99)

## 2013-04-11 LAB — CBC
HCT: 27.8 % — ABNORMAL LOW (ref 39.0–52.0)
MCH: 29 pg (ref 26.0–34.0)
MCV: 94.9 fL (ref 78.0–100.0)
Platelets: 106 10*3/uL — ABNORMAL LOW (ref 150–400)
RDW: 17.1 % — ABNORMAL HIGH (ref 11.5–15.5)
WBC: 10.4 10*3/uL (ref 4.0–10.5)

## 2013-04-11 MED ORDER — BIOTENE DRY MOUTH MT LIQD
15.0000 mL | Freq: Two times a day (BID) | OROMUCOSAL | Status: DC
  Administered 2013-04-11 – 2013-04-23 (×19): 15 mL via OROMUCOSAL

## 2013-04-11 MED ORDER — INSULIN DETEMIR 100 UNIT/ML ~~LOC~~ SOLN
10.0000 [IU] | Freq: Every day | SUBCUTANEOUS | Status: DC
  Administered 2013-04-11 – 2013-04-23 (×12): 10 [IU] via SUBCUTANEOUS
  Filled 2013-04-11 (×14): qty 0.1

## 2013-04-11 MED ORDER — OXYCODONE-ACETAMINOPHEN 5-325 MG PO TABS
2.0000 | ORAL_TABLET | ORAL | Status: DC | PRN
  Administered 2013-04-11 – 2013-04-20 (×26): 2 via ORAL
  Filled 2013-04-11 (×28): qty 2

## 2013-04-11 MED ORDER — INSULIN ASPART 100 UNIT/ML ~~LOC~~ SOLN
0.0000 [IU] | Freq: Three times a day (TID) | SUBCUTANEOUS | Status: DC
  Administered 2013-04-11 – 2013-04-12 (×3): 2 [IU] via SUBCUTANEOUS
  Administered 2013-04-12 – 2013-04-18 (×9): 1 [IU] via SUBCUTANEOUS
  Administered 2013-04-18: 2 [IU] via SUBCUTANEOUS
  Administered 2013-04-18: 1 [IU] via SUBCUTANEOUS
  Administered 2013-04-20 – 2013-04-21 (×4): 2 [IU] via SUBCUTANEOUS
  Administered 2013-04-22: 17:00:00 via SUBCUTANEOUS
  Administered 2013-04-22: 1 [IU] via SUBCUTANEOUS
  Administered 2013-04-23: 2 [IU] via SUBCUTANEOUS

## 2013-04-11 MED ORDER — OXYCODONE-ACETAMINOPHEN 5-325 MG PO TABS
1.0000 | ORAL_TABLET | Freq: Once | ORAL | Status: AC
  Administered 2013-04-11: 1 via ORAL
  Filled 2013-04-11: qty 1

## 2013-04-11 MED ORDER — HYDROMORPHONE HCL PF 1 MG/ML IJ SOLN
1.0000 mg | INTRAMUSCULAR | Status: DC | PRN
  Administered 2013-04-11 – 2013-04-16 (×11): 1 mg via INTRAVENOUS
  Filled 2013-04-11 (×13): qty 1

## 2013-04-11 MED ORDER — INSULIN ASPART 100 UNIT/ML ~~LOC~~ SOLN: 0.0000 [IU] | Freq: Every day | SUBCUTANEOUS | Status: DC

## 2013-04-11 MED ORDER — FUROSEMIDE 80 MG PO TABS
80.0000 mg | ORAL_TABLET | Freq: Two times a day (BID) | ORAL | Status: DC
  Administered 2013-04-11 – 2013-04-12 (×2): 80 mg via ORAL
  Filled 2013-04-11 (×5): qty 1

## 2013-04-11 NOTE — Progress Notes (Signed)
Patient Name: [name] Date of Encounter: 04/11/2013    Principal Problem:   SIRS (systemic inflammatory response syndrome) Active Problems:   History of renal transplant   HTN (hypertension)   Diabetes mellitus type 2, controlled   Cellulitis of right lower extremity   CAD (coronary artery disease)   Renal failure (ARF), acute on chronic kidney disease stage 3   Dehydration   Acute respiratory failure with hypoxia   Hx of decompressive lumbar laminectomy (L-3) May12, 2014   Pericardial effusion   SUBJECTIVE  No chest pain, sob.  Though initial POC troponin was elevated, subsequent troponins have been normal.  Echo done yesterday which showed biV failure with large pericardial effusion.  His dry weight is normally 172-174 lbs on his home scale has increased to 182 on his home scale (195 here) and today 208 lbs.   CURRENT MEDS . ipratropium  0.5 mg Nebulization BID   And  . albuterol  2.5 mg Nebulization BID  . aspirin EC  325 mg Oral Daily  . atorvastatin  40 mg Oral q1800  . carvedilol  12.5 mg Oral BID WC  . colchicine  0.6 mg Oral Daily  . enoxaparin (LOVENOX) injection  40 mg Subcutaneous Q24H  . famotidine  20 mg Oral Daily  . mycophenolate  500 mg Oral BID  . omega-3 acid ethyl esters  1 g Oral Daily  . piperacillin-tazobactam (ZOSYN)  IV  3.375 g Intravenous Q8H  . predniSONE  5 mg Oral BID  . pyridOXINE  200 mg Oral Daily  . sodium chloride  3 mL Intravenous Q12H  . vancomycin  1,000 mg Intravenous Q24H  . vitamin B-12  250 mcg Oral Daily  . vitamin C  500 mg Oral Daily   OBJECTIVE  Filed Vitals:   04/11/13 0200 04/11/13 0300 04/11/13 0318 04/11/13 0746  BP: 157/76 164/87 164/87   Pulse: 85 79 80   Temp:   98.4 F (36.9 C)   TempSrc:   Oral   Resp: 20 33 26   Height:      Weight:   94.8 kg (208 lb 15.9 oz)   SpO2: 95% 97% 99% 99%    Intake/Output Summary (Last 24 hours) at 04/11/13 0750 Last data filed at 04/11/13 0600  Gross per 24 hour    Intake   4500 ml  Output   1000 ml  Net   3500 ml   Filed Weights   04/09/13 2310 04/10/13 0630 04/11/13 0318  Weight: 88.1 kg (194 lb 3.6 oz) 88.9 kg (195 lb 15.8 oz) 94.8 kg (208 lb 15.9 oz)   PHYSICAL EXAM  General: Pleasant, NAD. Neuro: Alert and oriented X 3. Moves all extremities spontaneously. Psych: Normal affect. HEENT:  Normal  Neck: Supple without bruits.  Mod elevated JVP. Lungs:  Resp regular and unlabored, CTA. Heart: RRR no s3, s4, 2/6 syst murmur @ apex with rub. Abdomen: Soft, non-tender, non-distended, BS + x 4.  Extremities: No clubbing, cyanosis.  1+ bilat LEE with erythema/cellulitis of the RLE.  DP/PT/Radials 1+ and equal bilaterally.  Accessory Clinical Findings  CBC  Recent Labs  04/09/13 1855 04/10/13 0845 04/11/13 0430  WBC 17.8* 15.1* 10.4  NEUTROABS 16.0*  --   --   HGB 10.4* 8.4* 8.5*  HCT 33.1* 26.9* 27.8*  MCV 94.6 94.7 94.9  PLT 157 115* 106*   Basic Metabolic Panel  Recent Labs  04/10/13 0200 04/11/13 0430  NA 139 135  K 5.0 4.3  CL 104 103  CO2 21 19  GLUCOSE 64* 208*  BUN 62* 58*  CREATININE 2.45* 2.38*  CALCIUM 8.2* 7.9*   Liver Function Tests  Recent Labs  04/09/13 1855 04/10/13 0200  AST 29 28  ALT 20 16  ALKPHOS 66 57  BILITOT 0.4 0.3  PROT 5.3* 4.3*  ALBUMIN 2.9* 2.3*   Cardiac Enzymes  Recent Labs  04/10/13 0200 04/10/13 0958  TROPONINI <0.30 <0.30   D-Dimer  Recent Labs  04/09/13 1856  DDIMER >20.00*   TELE  Sinus/pod.  ECG  V-paced, 78, no acute changes.  2D Echocardiogram 5.27.2014 Study Conclusions  - Left ventricle: The cavity size was moderately dilated.   There was mild focal basal hypertrophy of the septum.   Systolic function was moderately to severely reduced. The   estimated ejection fraction was in the range of 30% to   35%. There is akinesis of the inferior posterior   myocardium. There is hypokinesis of the lateral   myocardium. Features are consistent with a  pseudonormal   left ventricular filling pattern, with concomitant   abnormal relaxation and increased filling pressure (grade   2 diastolic dysfunction). - Aortic valve: Trivial regurgitation. - Mitral valve: Moderate regurgitation. - Left atrium: The atrium was moderately dilated. - Right ventricle: The cavity size was mildly dilated.   Systolic function was severely reduced. - Right atrium: The atrium was mildly dilated. - Pulmonary arteries: PA peak pressure: 39mm Hg (S). - Pericardium, extracardiac: A large pericardial effusion   was identified with mild RA collapse; no RV   diastolic collapse; IVC mildly dilated with blunted   response to inspiration. I_____________  Bilateral LE U/S 5.27.2014  Bilateral lower extremity venous duplex  completed.    Preliminary report:  Bilateral:  No evidence of DVT, superficial thrombosis, or [name]'s Cyst. _____________  Radiology/Studies   CT Lumbar Spine Wo Contrast   IMPRESSION:  1.  Interval postsurgical changes at L3-L4. An epidural air fluid collection extends inferiorly from the disc space behind the L4 vertebral body and exerts mass effect on the thecal sac.  As this appears contiguous with the L3-L4 disc which demonstrates vacuum phenomenon, this may reflect an extruded disc fragment.  I cannot exclude an epidural abscess, although no generalized inflammatory changes are identified. There is persistent foraminal narrowing at L3-L4, worse on the left. 2.  No CT evidence of osteomyelitis. 3.  Stable disc protrusions at L4-L5 and L5-S1. 4.  Apparent hydronephrosis of the native right renal collecting system of uncertain significance and etiology.   Original Report Authenticated By: Carey Bullocks, M.D.   Ct Knee Right Wo Contrast  04/10/2013   *RADIOLOGY REPORT*  Clinical Data: Knee swelling and fever.  Question osteomyelitis.  CT OF THE RIGHT KNEE WITHOUT CONTRAST   IMPRESSION:  1.  No CT evidence of osteomyelitis.  Please note that CT  scan is not sensitive for the detection of osteomyelitis. 2.  Small joint effusion.  No focal fluid collection is present. 3.  Subcutaneous edema diffusely about the knee is likely due to dependent change or possibly cellulitis. 4.  Atherosclerosis.   Original Report Authenticated By: Holley Dexter, M.D.   Nm Pulmonary Perf And Vent  04/10/2013   *RADIOLOGY REPORT*  Clinical Data:  Shortness of breath, history hypertension, diabetes, coronary artery disease post MI and coronary angioplasty, CHF  NUCLEAR MEDICINE VENTILATION - PERFUSION LUNG SCAN  IMPRESSION: Very low probability for pulmonary embolism.   Original Report Authenticated By: [name], M.D.  Dg Chest Port 1 View  04/09/2013   *RADIOLOGY REPORT*  Clinical Data: Hypoglycemia and shortness of breath  PORTABLE CHEST - 1 VIEW   IMPRESSION: Decreased lung volumes and central venous congestion. Cardiomegaly.   Original Report Authenticated By: Genevive Bi, M.D.    ASSESSMENT AND PLAN  1.  Large pericardial effusion:  No evidence of tamponade but heart rate is trending up. Etiology of effusion is not clear.  colchicine 0.6mg  daily was added. Will repeat a limited echo tomorrow. He might require pericardial window.   2.  Sirs/Cellulitis:  Abx per IM.  3.  Acute on chronic systolic CHF/cardiomyopathy/CAD:  He is s/p PTCA x 2 in 1991 and then arrest last May with cath (no intervention) followed by BiV ICD placement.  He has been having weight gain, edema, and DOE.  No orthopnea, pnd.  He seems to be significantly volume overload with significant weight gain.   Cont bb, asa, statin.  No acei/arb 2/2 CKD. I recommend stopping IVFs and resuming Lasix 40 mg once daily. Might need to consult nephrology due to kidney transplant. Should avoid over diuresis due to #1.   4.  CKD s/p transplant:  Creat 2.45.  He says that this normally runs 2.6-2.7 since last year, thus he is at baseline.  Cont cellcept/prednisone.  5.  Lumbar/L Leg Pain:  Was  taking prn percocet @ home - resume.  6.  HTN:  Stable.  7.  DM: per IM.  8.  Anemia:  Heparin off.  Signed,  Lorine Bears, MD 04/11/2013 7:50 AM

## 2013-04-11 NOTE — Progress Notes (Signed)
Inpatient Diabetes Program Recommendations  AACE/ADA: New Consensus Statement on Inpatient Glycemic Control (2013)  Target Ranges:  Prepandial:   less than 140 mg/dL      Peak postprandial:   less than 180 mg/dL (1-2 hours)      Critically ill patients:  140 - 180 mg/dL   Reason for Visit: Hypoglycemia  Results for COMMODORE, BELLEW (MRN [id number]) as of 04/11/2013 09:53  Ref. Range 04/10/2013 18:29 04/10/2013 21:22 04/11/2013 00:20 04/11/2013 03:16 04/11/2013 07:45 04/11/2013 09:12  Glucose-Capillary Latest Range: 70-99 mg/dL 409 (H) 811 (H) 914 (H) 211 (H) 193 (H) 191 (H)    Inpatient Diabetes Program Recommendations Insulin - Basal: Levemir 20 QHS (half of home dose) Correction (SSI): Add Novolog sensitive tidwc Insulin - Meal Coverage: May need meal coverage insulin when appetite improves

## 2013-04-11 NOTE — ED Provider Notes (Signed)
Medical screening examination/treatment/procedure(s) were conducted as a shared visit with non-physician practitioner(s) and myself.  I personally evaluated the patient during the encounter 8:17 PM  67 yo man who is a kidney transplant recipient, who has had recent back surgery, presents with high fever and tachycardia. Exam shows cellulitis of the right leg. He had an episode of hypoglycemia, treated with D50W. Lab workup shows WBC 17000. Chest x-ray negative. UA negative. Lactic acid 1.5. Call to Dr. Vassie Loll of Pulmonary and Critical Care who advised IV fluids and Antibiotic treatment; he advised that pt could be admitted to [facility]. Carleene Cooper III, MD 04/11/13 1021

## 2013-04-11 NOTE — Consult Note (Signed)
[name]        RENAL CONSULT   Reason for Consult: Renal transplant patient (transplanted 1974) who is admitted for  cellulitis, fluid overload, pericardial effusion, and S./P. L3 lumbar laminectomy and discectomy 03/26/2013 by Dr. Gerlene Fee. Referring Physician: Dr. Reather Littler  HPI: [name] is a 67 y.o. white man who had a renal transplant at [facility] in [facility]. He sees Dr. Jaquita Rector as transplant nephrologist in [place].  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.   Dr. Edwyna Shell says that [name] was last seen by him in May 2013 at that time CR was 1.8. Results of creatinine over the last several months are as follows:   October 2013-1.7, 03/23/2013-2.2, 04/11/2013-2.53.     [name] was admitted on 26 May with hypoglycemia, lower extremity cellulitis, and fluid overload. 2-D echo showed a pericardial effusion. He has been given colchicine by cardiology.      He sees Dr. Norman Herrlich  [facility] cardiology-[place]) he had a V. fib arrest March 2013. AICD was placed 02/25/2012. He underwent cardiac catheterization in [place] ([facility]) on 02/23/2012 50-60% first and second OM, 2-100% RCA lesions plus intact collaterals."   He had 2-D echo done yesterday-dilated RV, EF 30-35%, akinesis of the inferior posterior myocardium, hypokinesis of lateral myocardium, plus large pericardial effusion. Renal consult was requested by Dr. Sharon Seller .    Past Medical History  Diagnosis Date  . Myocardial infarction 04/12/1990  . CHF (congestive heart failure) 01/2012  . Hypertension   . Diabetes mellitus   . ICD (implantable cardiac defibrillator) in place   . Pacemaker 02/15/2012    [employer]  . Pneumonia 2011  . Arthritis   . History of blood transfusion   . Chronic kidney disease     Glomerulonephritis  . Dysrhythmia   . Cancer     Bladder Cancer- 2004 .  SkinCancer- Basal, Squamous, and a few melymona Ureteral stricture after  kidney transplant-S./P. transplant ureteropyeloplasty July 2010   . Right foot drop       Family history-- father died age 60 of "bone cancer," mother died age 34 of CVA, one brother with "heart trouble and a bad hip," 2 children-a son age 26 and daughter age 65-healthy. No renal disease in the family  Social History: Born and grew up in [place], graduated from [employer], Lives with his second wife, they have been married 25 yr, His first marriage ended in divorce after 18 years. He worked for [employer] for 48 years (recently retired). 20 pack years cigarettes(none in 30 years), drinks wine once a week  Allergies: No Known Allergies  Medications:  I have reviewed the patient's current medications.  ROS- no angina, no claudication, no melena, no hematochezia, no gross hematuria, no renal colic, no decreased force of urinary stream, no nocturia, no doe, no orthopnea, no purulent sputum, no hemoptysis, no weight gain or weight loss, no cold or heat intolerance. He has had difficulty walking since his back surgery. He says his left hip hurts him whenever he moves  Physical Exam Blood pressure 148/70, pulse 90, temperature 98.7 F (37.1 C), temperature source Oral, resp. rate 21, height 6\' 1"  (1.854 m), weight 94.8 kg (208 lb 15.9 oz), SpO2 96.00%. General-awake alert, courteous, friendly, chronically ill-appearing, numerous actinic keratoses on extremities, abdomen, and trunk Chest-AICD left subclavian, decreased breath sounds in bases Heart-no rub Abd-allograft present LLQ, no other organs or masses are felt,  bowel sounds present Extr-clotted AV fistula right forearm; venous stasis changes, 2+ edema, erythema right lower extremity; venous stasis changes and 2+ edema left lower extremity Neuro- left-handed, strength equal, sensation intact  Lab- hemoglobin 8.5, WBC 10,400, platelets 106K Sodium 135, potassium 4.3, chloride 103, CO2 19, BUN 58, creatinine 2.38,  calcium 7.9, glucose 208 U A.-SG 1.012, pH 6, 4+ protein, 0-2 RBCs, occasional hyaline cast, glucose, ketones, nitrite, LE-all negative CXR--big heart, pulmonary vascular congestion   Assessment/Plan: 1. Renal transplant (done in 1974) now with worsening renal function. Will check renal ultrasound to be sure no lower tract obstruction 2. Edema, weight gain, and evidence of volume expansion--I would recommend furosemide 80 BID. I don't think he needs extra IV fluids at this time 3. Pericardial effusion--on colchicine. I think diuresis would help to lessen pericardial effusion 4. 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 Coronary artery disease--see above cardiac cath results. Per cardiology (followed by Dr. Dulce Sellar in [place]) 6. Cellulitis lower extremities--continue antibiotics. Diuretics will help here as well (in my opinion) 7. S./P. recent back surgery (Dr. Tenna Delaine asked Dr. Gerlene Fee to see [name] 8. Anemia--check iron studies 9. DM-2/hypoglycemia-per primary doctor's 10. High BP--BP not elevated now  Haly Feher F 04/11/2013, 3:45 PM

## 2013-04-11 NOTE — Progress Notes (Signed)
[facility] Progress Note [place] TEAM 1 - Stepdown/ICU TEAM   HANNAN HUTMACHER ZOX:[id number] DOB: 09-Aug-1946 DOA: 04/09/2013 PCP: [name], MD  Brief narrative: 67 year old male patient on chronic immunosuppression after renal transplant remotely. Had lumbar decompression surgery 2 weeks prior to admission. Patient's wife brought him to the ER because he was confused and short of breath. In the ER patient was febrile and he was noted to have right lower extremity erythematous changes that appear to be consistent with cellulitis. Patient had a prior history of left septic knee and had been followed by ID at that time. Patient was also hypoglycemic with a blood sugar of 50. 24 hours prior to presenting to the ER patient had been doing well since his surgery. He had been having right knee pain but denied any back pain at the surgical site and had not had any drainage from the surgical site. Chest x-ray showed possible central vascular congestion - the patient had mildly elevated troponin. He also had a paced rhythm with a bundle branch block. He also appeared to be in acute renal failure.  Assessment/Plan:    SIRS (systemic inflammatory response syndrome) -likely source RLE cellulitis -CT lumbar spine ? possible abscess (epidural air fluid collection) but no gen inflammatory changes -cont empiric anbx's and supportive care; follow all cx's    Cellulitis of right lower extremity -CT knee without osteo but MRI preferred imaging and can't obtain due to pacemaker -tiny joint effusion and SQ edema but no focal fluid collection -cont empiric anbx's -clinically improving    Renal failure (ARF), acute on chronic kidney disease stage 3 -? baseline BUN/Cr 33/1.68 per labs 9/13 but pt endorsed new baseline of ~2.6 -now at least euvolemic if not overloaded so will NSL IV and consider Lasix since BP up (resp status stable) -consult renal    Demand ischemia/CAD (coronary artery  disease) -subsequent enzymes have been normal and no CP -cont Coreg and ASA  Systolic Heart Failure/EF 30-35%/Grade 2 Diastolic Heart Failure -baseline EF unknown but given marked cardiomegaly on CXR and ICD likely has chronic SHF with EF </= 30% -as above re IVF's -cont coreg-doubt can use ACE with CKD and h/o renal transplant -Cards following  Large Pericardial Effusion -cards managing - seen on ECHO this admit -started on Colchicine for possible pericarditis etiology -?early tamponade physiology- cards repeating ECHO 5/29    Positive D dimer -VQ scan with VERY low probability for PE     History of renal transplant -cont Prednisone and Cellcept for now -suspect need records from primary nephrologist in [place]    Acute respiratory failure with hypoxia   -improved - oxygen weaned to RA   Exopthalmus -check TSH    PVD -had purple discolored areas to left GT and bases of toes left foot and foot cool to touch 5/27 - 5/28 edema and pallor/cooleness resolved -doppler pulse present -pt endorsed vascular studies 4 months ago at [place] -follow    HTN (hypertension) -BP controlled  -cont home meds    Diabetes mellitus type 2, controlled/hypoglycemia -Levemir at home on hold -CBG much improved at this time   Hx of decompressive lumbar laminectomy (L-3) May12, 2014 -?of epidural abscess as per CT scan of spine -initially no back pain endorsed but today reports significant left hip radiculopathy/pain that he now says was occurring prior to admit - actually had appt with Dr. Gerlene Fee 5/28 -have formally consulted Dr.Kritzer today 5/28 - he has reviewed the CT lumbar spine and does not  feel changes are c/w infection   DVT prophylaxis:  Lovenox   Code Status: Full Family Communication: Patient Disposition Plan: Stepdown Isolation: None  Consultants: Dr. Gerlene Fee with neurosurgery Cardiology Nephrology  Procedures: 2-D echocardiogram  - Left ventricle: The cavity size  was moderately dilated. There was mild focal basal hypertrophy of the septum. Systolic function was moderately to severely reduced. The estimated ejection fraction was in the range of 30% to 35%. There is akinesis of the inferior posterior myocardium. There is hypokinesis of the lateral myocardium. Features are consistent with a pseudonormal left ventricular filling pattern, with concomitant abnormal relaxation and increased filling pressure (grade 2 diastolic dysfunction). - Aortic valve: Trivial regurgitation. - Mitral valve: Moderate regurgitation. - Left atrium: The atrium was moderately dilated. - Right ventricle: The cavity size was mildly dilated. Systolic function was severely reduced. - Right atrium: The atrium was mildly dilated. - Pulmonary arteries: PA peak pressure: 39mm Hg (S). - Pericardium, extracardiac: A large pericardial effusion was identified. Impressions: - Inferoposterior akinesis and lateral hypokinesis with moderate to severe reduction in LV function; MR difficult to quantitate due to eccentric nature of jet but probably moderate; RAE/RVE with severely reduced RV function; large pericardial effusion with mild RA collapse; no RV diastolic collapse; IVC mildly dilated with blunted response to inspiration.  Lower extremity venous duplex  negative for DVT  Antibiotics: Zosyn 5/26 >>> Vancomycin 5/26 >>>  HPI/Subjective: Patient alert and now endorses left hip pain and inability to walk due to pain. No SOB or CP.  Denies loss of strength in lower extremities or loss of sensation in lower extremities.  Objective: Blood pressure 143/85, pulse 82, temperature 99.1 F (37.3 C), temperature source Oral, resp. rate 18, height 6\' 1"  (1.854 m), weight 94.8 kg (208 lb 15.9 oz), SpO2 99.00%.  Intake/Output Summary (Last 24 hours) at 04/11/13 1310 Last data filed at 04/11/13 1200  Gross per 24 hour  Intake   3603 ml  Output   1250 ml  Net   2353 ml     Exam: General: No acute respiratory distress, MAXIMUM TEMPERATURE 101.3 since admission Lungs: Clear to auscultation bilaterally without wheezes or crackles, RA Cardiovascular: Regular rate and ventricular paced rhythm without murmur gallop or rub normal S1 and S2, no JVD but does have 2+ pitting edema of right foot Abdomen: Nontender, nondistended, soft, bowel sounds positive, no rebound, no ascites, no appreciable mass Musculoskeletal: No significant cyanosis, clubbing of bilateral lower extremities-new focal edema RUE above elbow Neurological: Alert and oriented x 3, moves all extremities x 4 without focal neurological deficits, CN 2-12 intact Integumentary: Patient has diffuse papular scaly lesions throughout entire body that he reports has been present for greater than 20 years; has decreased erythematous skin changes on the right lower extremity extending from the foot to the thigh more defined below the knee - resolved erythematous changes on the left lower extremity mainly involving the calf.  Scheduled Meds: Scheduled Meds: . ipratropium  0.5 mg Nebulization BID   And  . albuterol  2.5 mg Nebulization BID  . antiseptic oral rinse  15 mL Mouth Rinse BID  . aspirin EC  325 mg Oral Daily  . atorvastatin  40 mg Oral q1800  . carvedilol  12.5 mg Oral BID WC  . colchicine  0.6 mg Oral Daily  . enoxaparin (LOVENOX) injection  40 mg Subcutaneous Q24H  . famotidine  20 mg Oral Daily  . insulin aspart  0-5 Units Subcutaneous QHS  . insulin aspart  0-9 Units Subcutaneous TID WC  . insulin detemir  10 Units Subcutaneous Daily  . mycophenolate  500 mg Oral BID  . omega-3 acid ethyl esters  1 g Oral Daily  . piperacillin-tazobactam (ZOSYN)  IV  3.375 g Intravenous Q8H  . predniSONE  5 mg Oral BID  . pyridOXINE  200 mg Oral Daily  . sodium chloride  3 mL Intravenous Q12H  . vancomycin  1,000 mg Intravenous Q24H  . vitamin B-12  250 mcg Oral Daily  . vitamin C  500 mg Oral Daily    Data Reviewed: Basic Metabolic Panel:  Recent Labs Lab 04/09/13 1855 04/10/13 0200 04/11/13 0430  NA 140 139 135  K 4.7 5.0 4.3  CL 103 104 103  CO2 24 21 19   GLUCOSE 55* 64* 208*  BUN 63* 62* 58*  CREATININE 2.44* 2.45* 2.38*  CALCIUM 9.1 8.2* 7.9*   Liver Function Tests:  Recent Labs Lab 04/09/13 1855 04/10/13 0200  AST 29 28  ALT 20 16  ALKPHOS 66 57  BILITOT 0.4 0.3  PROT 5.3* 4.3*  ALBUMIN 2.9* 2.3*   CBC:  Recent Labs Lab 04/09/13 1855 04/10/13 0845 04/11/13 0430  WBC 17.8* 15.1* 10.4  NEUTROABS 16.0*  --   --   HGB 10.4* 8.4* 8.5*  HCT 33.1* 26.9* 27.8*  MCV 94.6 94.7 94.9  PLT 157 115* 106*   Cardiac Enzymes:  Recent Labs Lab 04/10/13 0200 04/10/13 0958  TROPONINI <0.30 <0.30   BNP (last 3 results)  Recent Labs  04/09/13 1856  PROBNP 21940.0*   CBG:  Recent Labs Lab 04/11/13 0020 04/11/13 0316 04/11/13 0745 04/11/13 0912 04/11/13 1221  GLUCAP 239* 211* 193* 191* 192*    Recent Results (from the past 240 hour(s))  CULTURE, BLOOD (ROUTINE X 2)     Status: None   Collection Time    04/09/13  6:45 PM      Result Value Range Status   Specimen Description BLOOD RIGHT ARM   Final   Special Requests BOTTLES DRAWN AEROBIC ONLY 6CC   Final   Culture  Setup Time 04/10/2013 01:04   Final   Culture     Final   Value:        BLOOD CULTURE RECEIVED NO GROWTH TO DATE CULTURE WILL BE HELD FOR 5 DAYS BEFORE ISSUING A FINAL NEGATIVE REPORT   Report Status PENDING   Incomplete  CULTURE, BLOOD (ROUTINE X 2)     Status: None   Collection Time    04/09/13  6:50 PM      Result Value Range Status   Specimen Description BLOOD RIGHT WRIST   Final   Special Requests BOTTLES DRAWN AEROBIC ONLY 2CC   Final   Culture  Setup Time 04/10/2013 01:04   Final   Culture     Final   Value:        BLOOD CULTURE RECEIVED NO GROWTH TO DATE CULTURE WILL BE HELD FOR 5 DAYS BEFORE ISSUING A FINAL NEGATIVE REPORT   Report Status PENDING   Incomplete  URINE  CULTURE     Status: None   Collection Time    04/09/13  7:44 PM      Result Value Range Status   Specimen Description URINE, CATHETERIZED   Final   Special Requests NONE   Final   Culture  Setup Time 04/09/2013 20:44   Final   Colony Count NO GROWTH   Final   Culture NO GROWTH   Final  Report Status 04/10/2013 FINAL   Final  MRSA PCR SCREENING     Status: None   Collection Time    04/10/13 12:04 AM      Result Value Range Status   MRSA by PCR NEGATIVE  NEGATIVE Final   Comment:            The GeneXpert MRSA Assay (FDA     approved for NASAL specimens     only), is one component of a     comprehensive MRSA colonization     surveillance program. It is not     intended to diagnose MRSA     infection nor to guide or     monitor treatment for     MRSA infections.     Studies:  Recent x-ray studies have been reviewed in detail by the Attending Physician  Scheduled Meds:  Reviewed in detail by the Attending Physician   Junious Silk, ANP [facility] Office  [phone] Pager [phone]  On-Call/Text Page:      [email or web address]      password [email or web address]  If 7PM-7AM, please contact night-coverage [email or web address] Password [email or web address] 04/11/2013, 1:10 PM   LOS: 2 days   I have personally examined this patient and reviewed the entire database. I have reviewed the above note, made any necessary editorial changes, and agree with its content.  Lonia Blood, MD [facility]

## 2013-04-12 ENCOUNTER — Inpatient Hospital Stay (HOSPITAL_COMMUNITY): Payer: PRIVATE HEALTH INSURANCE

## 2013-04-12 DIAGNOSIS — I319 Disease of pericardium, unspecified: Secondary | ICD-10-CM

## 2013-04-12 LAB — CBC
HCT: 26.4 % — ABNORMAL LOW (ref 39.0–52.0)
MCV: 95.7 fL (ref 78.0–100.0)
RBC: 2.76 MIL/uL — ABNORMAL LOW (ref 4.22–5.81)
RDW: 17 % — ABNORMAL HIGH (ref 11.5–15.5)
WBC: 9.8 10*3/uL (ref 4.0–10.5)

## 2013-04-12 LAB — GLUCOSE, CAPILLARY
Glucose-Capillary: 159 mg/dL — ABNORMAL HIGH (ref 70–99)
Glucose-Capillary: 103 mg/dL — ABNORMAL HIGH (ref 70–99)
Glucose-Capillary: 152 mg/dL — ABNORMAL HIGH (ref 70–99)
Glucose-Capillary: 162 mg/dL — ABNORMAL HIGH (ref 70–99)

## 2013-04-12 LAB — RENAL FUNCTION PANEL
Albumin: 1.9 g/dL — ABNORMAL LOW (ref 3.5–5.2)
BUN: 60 mg/dL — ABNORMAL HIGH (ref 6–23)
CO2: 21 mEq/L (ref 19–32)
Chloride: 101 mEq/L (ref 96–112)
Creatinine, Ser: 2.77 mg/dL — ABNORMAL HIGH (ref 0.50–1.35)
GFR calc non Af Amer: 23 mL/min — ABNORMAL LOW (ref >90)
Potassium: 4.6 mEq/L (ref 3.5–5.1)

## 2013-04-12 LAB — TSH: TSH: 0.795 u[IU]/mL (ref 0.350–4.500)

## 2013-04-12 LAB — IRON AND TIBC: UIBC: 169 ug/dL (ref 125–400)

## 2013-04-12 LAB — VANCOMYCIN, TROUGH: Vancomycin Tr: 19.2 ug/mL (ref 10.0–20.0)

## 2013-04-12 LAB — FERRITIN: Ferritin: 375 ng/mL — ABNORMAL HIGH (ref 22–322)

## 2013-04-12 MED ORDER — FERUMOXYTOL INJECTION 510 MG/17 ML
510.0000 mg | INTRAVENOUS | Status: AC
  Administered 2013-04-12 – 2013-04-15 (×2): 510 mg via INTRAVENOUS
  Filled 2013-04-12 (×3): qty 17

## 2013-04-12 MED ORDER — FUROSEMIDE 10 MG/ML IJ SOLN
80.0000 mg | Freq: Two times a day (BID) | INTRAMUSCULAR | Status: DC
  Administered 2013-04-12 – 2013-04-13 (×2): 80 mg via INTRAVENOUS
  Filled 2013-04-12 (×3): qty 8

## 2013-04-12 MED ORDER — VANCOMYCIN HCL IN DEXTROSE 750-5 MG/150ML-% IV SOLN
750.0000 mg | INTRAVENOUS | Status: AC
  Administered 2013-04-13: 750 mg via INTRAVENOUS
  Filled 2013-04-12: qty 150

## 2013-04-12 MED ORDER — VANCOMYCIN HCL IN DEXTROSE 750-5 MG/150ML-% IV SOLN
750.0000 mg | INTRAVENOUS | Status: DC
  Filled 2013-04-12: qty 150

## 2013-04-12 MED ORDER — DARBEPOETIN ALFA-POLYSORBATE 100 MCG/0.5ML IJ SOLN
100.0000 ug | INTRAMUSCULAR | Status: DC
  Administered 2013-04-12: 100 ug via SUBCUTANEOUS
  Filled 2013-04-12 (×2): qty 0.5

## 2013-04-12 NOTE — Progress Notes (Signed)
[facility] Progress Note [place] TEAM 1 - Stepdown/ICU TEAM   BERNHARD KOSKINEN ZOX:[id number] DOB: 1946/03/01 DOA: 04/09/2013 PCP: [name], MD  Brief narrative: 67 year old male patient on chronic immunosuppression after renal transplant remotely. Had lumbar decompression surgery 2 weeks prior to admission. Patient's wife brought him to the ER because he was confused and short of breath. In the ER patient was febrile and he was noted to have right lower extremity erythematous changes that appear to be consistent with cellulitis. Patient had a prior history of left septic knee and had been followed by ID at that time. Patient was also hypoglycemic with a blood sugar of 50. 24 hours prior to presenting to the ER patient had been doing well since his surgery. He had been having right knee pain but denied any back pain at the surgical site and had not had any drainage from the surgical site. Chest x-ray showed possible central vascular congestion - the patient had mildly elevated troponin. He also had a paced rhythm with a bundle branch block. He also appeared to be in acute renal failure.  Assessment/Plan:    SIRS (systemic inflammatory response syndrome) -likely source RLE cellulitis -CT lumbar spine ? possible abscess (epidural air fluid collection) but no gen inflammatory changes -cont empiric anbx's and supportive care; follow all cx's    Cellulitis of right lower extremity -CT knee without osteo but MRI preferred imaging and can't obtain due to pacemaker -tiny joint effusion and SQ edema but no focal fluid collection -cont empiric anbx's -clinically improving    Renal failure (ARF), acute on chronic kidney disease stage 3 -? baseline BUN/Cr 33/1.68 per labs 9/13 but pt endorsed new baseline of ~2.6 -nephrology clarified with pt's primary nephrologist that baseline Scr is 1.8 -appears volume overloaded-PO Lasix started 5/28 but will change to IV dosing -appreciate renal  assistance    Demand ischemia/CAD (coronary artery disease) -subsequent enzymes have been normal and no CP -cont Coreg and ASA  Systolic Heart Failure/EF 30-35%/Grade 2 Diastolic Heart Failure -baseline EF 25% in March 2013 after PEA arrest requiring PPM/ICD -cont coreg-doubt can use ACE with CKD and h/o renal transplant -Cards following -diuresing with IV Lasix  Large Pericardial Effusion -cards managing - seen on ECHO this admit -started on Colchicine for possible pericarditis etiology -repeat ECHO 5/29 stable and plan is to repeat next week-still may require window  Anemia of CKD/severe iron deficiency/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 -consider IV iron this admit- was NOT on iron pre admit -hgb stable -was on oral B12 pre admit- check level here- may not be absorbing thru gut and may need injectable replacement    Positive D dimer -VQ scan with VERY low probability for PE     History of renal transplant -cont Prednisone and Cellcept for now -suspect need records from primary nephrologist in [place]    Acute respiratory failure with hypoxia   -improved - oxygen weaned to RA     Exopthalmus -TSH 0.795    PVD -had purple discolored areas to left GT and bases of toes left foot and foot cool to touch 5/27 - 5/28 edema and pallor/cooleness resolved -doppler pulse present -pt endorsed vascular studies 4 months ago at [facility] -follow    HTN (hypertension) -BP controlled  -cont home meds    Diabetes mellitus type 2, controlled/hypoglycemia -Levemir at home on hold -CBG much improved at this time   Hx of decompressive lumbar laminectomy (L-3) May12, 2014 -?of epidural abscess as per CT scan of spine -  initially no back pain endorsed but today reports significant left hip radiculopathy/pain that he now says was occurring prior to admit - actually had appt with Dr. Gerlene Fee 5/28 -pain better afte adjustments in narcs 5/28 -have formally consulted Dr.Kritzer 5/28 - he has reviewed the CT lumbar  spine and does not feel changes are c/w infection-he plans to see pt 5/29 -PT/OT recommending CIR when medically stable  History of atrial fib -occurred March 2013- pt refused anti coagulation  RUE edema -follow- had IV in same arm and if edema persists or worsens despite diuiresis may need venous duplex to r/o DVT   DVT prophylaxis:  Lovenox   Code Status: Full Family Communication: Patient Disposition Plan: Stepdown Isolation: None  Consultants: Dr. Gerlene Fee with neurosurgery Cardiology Nephrology  Procedures: 2-D echocardiogram  - Left ventricle: The cavity size was moderately dilated. There was mild focal basal hypertrophy of the septum. Systolic function was moderately to severely reduced. The estimated ejection fraction was in the range of 30% to 35%. There is akinesis of the inferior posterior myocardium. There is hypokinesis of the lateral myocardium. Features are consistent with a pseudonormal left ventricular filling pattern, with concomitant abnormal relaxation and increased filling pressure (grade 2 diastolic dysfunction). - Aortic valve: Trivial regurgitation. - Mitral valve: Moderate regurgitation. - Left atrium: The atrium was moderately dilated. - Right ventricle: The cavity size was mildly dilated. Systolic function was severely reduced. - Right atrium: The atrium was mildly dilated. - Pulmonary arteries: PA peak pressure: 39mm Hg (S). - Pericardium, extracardiac: A large pericardial effusion was identified. Impressions: - Inferoposterior akinesis and lateral hypokinesis with moderate to severe reduction in LV function; MR difficult to quantitate due to eccentric nature of jet but probably moderate; RAE/RVE with severely reduced RV function; large pericardial effusion with mild RA collapse; no RV diastolic collapse; IVC mildly dilated with blunted response to inspiration.  Lower extremity venous duplex  negative for DVT  Antibiotics: Zosyn 5/26  >>> Vancomycin 5/26 >>>  HPI/Subjective: Patient alert and now endorses left hip pain and inability to walk due to pain. No SOB or CP.  Denies loss of strength in lower extremities or loss of sensation in lower extremities.  Objective: Blood pressure 154/79, pulse 76, temperature 98.2 F (36.8 C), temperature source Oral, resp. rate 15, height 6\' 1"  (1.854 m), weight 95.6 kg (210 lb 12.2 oz), SpO2 100.00%.  Intake/Output Summary (Last 24 hours) at 04/12/13 1349 Last data filed at 04/12/13 1100  Gross per 24 hour  Intake    780 ml  Output   1475 ml  Net   -695 ml    Exam: General: No acute respiratory distress, MAXIMUM TEMPERATURE 101.3 since admission Lungs: Clear to auscultation bilaterally without wheezes or crackles, RA Cardiovascular: Regular rate and ventricular paced rhythm without murmur gallop or rub normal S1 and S2, no JVD but does have 2+ pitting edema of right foot Abdomen: Nontender, nondistended, soft, bowel sounds positive, no rebound, no ascites, no appreciable mass Musculoskeletal: No significant cyanosis, clubbing of bilateral lower extremities-new focal edema RUE above elbow Neurological: Alert and oriented x 3, moves all extremities x 4 without focal neurological deficits, CN 2-12 intact Integumentary: Patient has diffuse papular scaly lesions throughout entire body that he reports has been present for greater than 20 years; has decreased erythematous skin changes on the right lower extremity extending from the foot to the thigh more defined below the knee - resolved erythematous changes on the left lower extremity mainly involving the calf.  Scheduled Meds: Scheduled Meds: . ipratropium  0.5 mg Nebulization BID   And  . albuterol  2.5 mg Nebulization BID  . antiseptic oral rinse  15 mL Mouth Rinse BID  . aspirin EC  325 mg Oral Daily  . atorvastatin  40 mg Oral q1800  . carvedilol  12.5 mg Oral BID WC  . colchicine  0.6 mg Oral Daily  . darbepoetin (ARANESP)  injection - NON-DIALYSIS  100 mcg Subcutaneous Q Thu-1800  . enoxaparin (LOVENOX) injection  40 mg Subcutaneous Q24H  . famotidine  20 mg Oral Daily  . ferumoxytol  510 mg Intravenous Q72H  . furosemide  80 mg Intravenous Q12H  . insulin aspart  0-5 Units Subcutaneous QHS  . insulin aspart  0-9 Units Subcutaneous TID WC  . insulin detemir  10 Units Subcutaneous Daily  . mycophenolate  500 mg Oral BID  . omega-3 acid ethyl esters  1 g Oral Daily  . piperacillin-tazobactam (ZOSYN)  IV  3.375 g Intravenous Q8H  . predniSONE  5 mg Oral BID  . pyridOXINE  200 mg Oral Daily  . sodium chloride  3 mL Intravenous Q12H  . vancomycin  1,000 mg Intravenous Q24H  . vitamin B-12  250 mcg Oral Daily  . vitamin C  500 mg Oral Daily   Data Reviewed: Basic Metabolic Panel:  Recent Labs Lab 04/09/13 1855 04/10/13 0200 04/11/13 0430 04/12/13 0355  NA 140 139 135 136  K 4.7 5.0 4.3 4.6  CL 103 104 103 101  CO2 24 21 19 21   GLUCOSE 55* 64* 208* 119*  BUN 63* 62* 58* 60*  CREATININE 2.44* 2.45* 2.38* 2.77*  CALCIUM 9.1 8.2* 7.9* 8.6  PHOS  --   --   --  5.2*   Liver Function Tests:  Recent Labs Lab 04/09/13 1855 04/10/13 0200 04/12/13 0355  AST 29 28  --   ALT 20 16  --   ALKPHOS 66 57  --   BILITOT 0.4 0.3  --   PROT 5.3* 4.3*  --   ALBUMIN 2.9* 2.3* 1.9*   CBC:  Recent Labs Lab 04/09/13 1855 04/10/13 0845 04/11/13 0430 04/12/13 0355  WBC 17.8* 15.1* 10.4 9.8  NEUTROABS 16.0*  --   --   --   HGB 10.4* 8.4* 8.5* 8.2*  HCT 33.1* 26.9* 27.8* 26.4*  MCV 94.6 94.7 94.9 95.7  PLT 157 115* 106* 85*   Cardiac Enzymes:  Recent Labs Lab 04/10/13 0200 04/10/13 0958  TROPONINI <0.30 <0.30   BNP (last 3 results)  Recent Labs  04/09/13 1856  PROBNP 21940.0*   CBG:  Recent Labs Lab 04/11/13 1221 04/11/13 1639 04/11/13 2129 04/12/13 0829 04/12/13 1215  GLUCAP 192* 189* 159* 103* 138*    Recent Results (from the past 240 hour(s))  CULTURE, BLOOD (ROUTINE X 2)      Status: None   Collection Time    04/09/13  6:45 PM      Result Value Range Status   Specimen Description BLOOD RIGHT ARM   Final   Special Requests BOTTLES DRAWN AEROBIC ONLY 6CC   Final   Culture  Setup Time 04/10/2013 01:04   Final   Culture     Final   Value:        BLOOD CULTURE RECEIVED NO GROWTH TO DATE CULTURE WILL BE HELD FOR 5 DAYS BEFORE ISSUING A FINAL NEGATIVE REPORT   Report Status PENDING   Incomplete  CULTURE, BLOOD (ROUTINE X 2)  Status: None   Collection Time    04/09/13  6:50 PM      Result Value Range Status   Specimen Description BLOOD RIGHT WRIST   Final   Special Requests BOTTLES DRAWN AEROBIC ONLY 2CC   Final   Culture  Setup Time 04/10/2013 01:04   Final   Culture     Final   Value:        BLOOD CULTURE RECEIVED NO GROWTH TO DATE CULTURE WILL BE HELD FOR 5 DAYS BEFORE ISSUING A FINAL NEGATIVE REPORT   Report Status PENDING   Incomplete  URINE CULTURE     Status: None   Collection Time    04/09/13  7:44 PM      Result Value Range Status   Specimen Description URINE, CATHETERIZED   Final   Special Requests NONE   Final   Culture  Setup Time 04/09/2013 20:44   Final   Colony Count NO GROWTH   Final   Culture NO GROWTH   Final   Report Status 04/10/2013 FINAL   Final  MRSA PCR SCREENING     Status: None   Collection Time    04/10/13 12:04 AM      Result Value Range Status   MRSA by PCR NEGATIVE  NEGATIVE Final   Comment:            The GeneXpert MRSA Assay (FDA     approved for NASAL specimens     only), is one component of a     comprehensive MRSA colonization     surveillance program. It is not     intended to diagnose MRSA     infection nor to guide or     monitor treatment for     MRSA infections.     Studies:  Recent x-ray studies have been reviewed in detail by the Attending Physician  Scheduled Meds:  Reviewed in detail by the Attending Physician   Junious Silk, ANP [facility] Office  [phone] Pager  [phone]  On-Call/Text Page:      [email or web address]      password [email or web address]  If 7PM-7AM, please contact night-coverage [email or web address] Password [facility] 04/12/2013, 1:49 PM   LOS: 3 days    I have examined the patient, reviewed the chart and modified the above note which I agree with.   Mckinsley Koelzer,MD [phone] 04/12/2013, 3:09 PM

## 2013-04-12 NOTE — Progress Notes (Signed)
ANTIBIOTIC CONSULT NOTE-PROGRESS NOTE  Pharmacy Consult for : Vancomycin Indication: SIRS likely due to cellulitis  Hospital Problems Principal Problem:   SIRS (systemic inflammatory response syndrome) Active Problems:   History of renal transplant   HTN (hypertension)   Diabetes mellitus type 2, controlled   Cellulitis of right lower extremity   CAD (coronary artery disease)   Renal failure (ARF), acute on chronic kidney disease stage 3   Dehydration   Acute respiratory failure with hypoxia   Hx of decompressive lumbar laminectomy (L-3) May12, 2014   Pericardial effusion  Vitals: BP 146/72  Pulse 83  Temp(Src) 98.3 F (36.8 C) (Oral)  Resp 17  Ht 6\' 1"  (1.854 m)  Wt 210 lb 12.2 oz (95.6 kg)  BMI 27.81 kg/m2  SpO2 96%  Labs:  Recent Labs  04/10/13 0200  04/11/13 0430 04/12/13 0355  WBC  --   < > 10.4 9.8  HGB  --   < > 8.5* 8.2*  PLT  --   < > 106* 85*  CREATININE 2.45*  --  2.38* 2.77*  < > = values in this interval not displayed. Estimated Creatinine Clearance: 30.4 ml/min (by C-G formula based on Cr of 2.77).  Vancomycin trough 19.2 mcg/ml    Microbiology: Recent Results (from the past 720 hour(s))  SURGICAL PCR SCREEN     Status: None   Collection Time    03/23/13  2:01 PM      Result Value Range Status   MRSA, PCR NEGATIVE  NEGATIVE Final   Staphylococcus aureus NEGATIVE  NEGATIVE Final   Comment:            The Xpert SA Assay (FDA     approved for NASAL specimens     in patients over 61 years of age),     is one component of     a comprehensive surveillance     program.  Test performance has     been validated by [employer] for patients greater     than or equal to 34 year old.     It is not intended     to diagnose infection nor to     guide or monitor treatment.  CULTURE, BLOOD (ROUTINE X 2)     Status: None   Collection Time    04/09/13  6:45 PM      Result Value Range Status   Specimen Description BLOOD RIGHT ARM   Final   Special Requests BOTTLES DRAWN AEROBIC ONLY 6CC   Final   Culture  Setup Time 04/10/2013 01:04   Final   Culture     Final   Value:        BLOOD CULTURE RECEIVED NO GROWTH TO DATE CULTURE WILL BE HELD FOR 5 DAYS BEFORE ISSUING A FINAL NEGATIVE REPORT   Report Status PENDING   Incomplete  CULTURE, BLOOD (ROUTINE X 2)     Status: None   Collection Time    04/09/13  6:50 PM      Result Value Range Status   Specimen Description BLOOD RIGHT WRIST   Final   Special Requests BOTTLES DRAWN AEROBIC ONLY 2CC   Final   Culture  Setup Time 04/10/2013 01:04   Final   Culture     Final   Value:        BLOOD CULTURE RECEIVED NO GROWTH TO DATE CULTURE WILL BE HELD FOR 5 DAYS BEFORE ISSUING A FINAL NEGATIVE REPORT  Report Status PENDING   Incomplete  URINE CULTURE     Status: None   Collection Time    04/09/13  7:44 PM      Result Value Range Status   Specimen Description URINE, CATHETERIZED   Final   Special Requests NONE   Final   Culture  Setup Time 04/09/2013 20:44   Final   Colony Count NO GROWTH   Final   Culture NO GROWTH   Final   Report Status 04/10/2013 FINAL   Final  MRSA PCR SCREENING     Status: None   Collection Time    04/10/13 12:04 AM      Result Value Range Status   MRSA by PCR NEGATIVE  NEGATIVE Final   Comment:            The GeneXpert MRSA Assay (FDA     approved for NASAL specimens     only), is one component of a     comprehensive MRSA colonization     surveillance program. It is not     intended to diagnose MRSA     infection nor to guide or     monitor treatment for     MRSA infections.    Anti-infectives Anti-infectives   Start     Dose/Rate Route Frequency Ordered Stop   04/13/13 2300  vancomycin (VANCOCIN) IVPB 750 mg/150 ml premix     750 mg 150 mL/hr over 60 Minutes Intravenous Every 24 hours 04/12/13 2300     04/10/13 2200  vancomycin (VANCOCIN) IVPB 1000 mg/200 mL premix  Status:  Discontinued     1,000 mg 200 mL/hr over 60 Minutes Intravenous Every  24 hours 04/09/13 2050 04/12/13 2300   04/10/13 0300  piperacillin-tazobactam (ZOSYN) IVPB 3.375 g     3.375 g 12.5 mL/hr over 240 Minutes Intravenous 3 times per day 04/09/13 2050     04/09/13 2030  piperacillin-tazobactam (ZOSYN) IVPB 3.375 g     3.375 g 100 mL/hr over 30 Minutes Intravenous  Once 04/09/13 2018 04/09/13 2101   04/09/13 2030  vancomycin (VANCOCIN) IVPB 1000 mg/200 mL premix     1,000 mg 200 mL/hr over 60 Minutes Intravenous  Once 04/09/13 2018 04/09/13 2220      Assessment:  67 y/o male on chronic immunosuppression for renal transplant receiving Vancomycin and Zosyn for SIRS likely due to LE cellulitis.  Scr trending up, 2.77.  Vancomycin trough 19.2 mcg/ml.  Est CrCl ~ 30 ml/min.  With rising Scr and Vancomycin trough at upper end of therapeutic range, will adjust Vancomycin dose accordingly.  Goal of Therapy:   Vancomycin trough level 15-20 mcg/ml  Plan:   Change Vancomycin to 750 mg IV q 24 hours.  Follow up Vancomycin levels as indicated.  Laurena Bering, Pharm.D.  04/12/2013 11:01 PM

## 2013-04-12 NOTE — Progress Notes (Signed)
[facility]  Subjective:  Awake, alert, says R LE not as tender today.  Breathing "OK." Renal ultrasound done earlier this AM--no hydro, but + ascites, + pl effusions   Objective: Vital signs in last 24 hours: Blood pressure 145/74, pulse 73, temperature 98.3 F (36.8 C), temperature source Oral, resp. rate 20, height 6\' 1"  (1.854 m), weight 95.6 kg (210 lb 12.2 oz), SpO2 98.00%.    PHYSICAL EXAM General--awake alert, courteous, friendly, chronically ill-appearing, numerous actinic keratoses on extremities, abdomen, and trunk  Chest-AICD left subclavian, decreased breath sounds in bases  Heart-no rub  Abd-allograft present LLQ, no other organs or masses are felt, bowel sounds present  Extr-clotted AV fistula right forearm; venous stasis changes, 2+ edema, erythema right lower extremity; venous stasis changes and 2+ edema left lower extremity  Neuro- left-handed, strength equal, sensation intact  I/O last 24 hr 473/1525  Lab Results:   Recent Labs Lab 04/10/13 0200 04/11/13 0430 04/12/13 0355  NA 139 135 136  K 5.0 4.3 4.6  CL 104 103 101  CO2 21 19 21   BUN 62* 58* 60*  CREATININE 2.45* 2.38* 2.77*  GLUCOSE 64* 208* 119*  CALCIUM 8.2* 7.9* 8.6  PHOS  --   --  5.2*     Recent Labs  04/11/13 0430 04/12/13 0355  WBC 10.4 9.8  HGB 8.5* 8.2*  HCT 27.8* 26.4*  PLT 106* 85*     I have reviewed the patient's current medications. Scheduled: . ipratropium  0.5 mg Nebulization BID   And  . albuterol  2.5 mg Nebulization BID  . antiseptic oral rinse  15 mL Mouth Rinse BID  . aspirin EC  325 mg Oral Daily  . atorvastatin  40 mg Oral q1800  . carvedilol  12.5 mg Oral BID WC  . colchicine  0.6 mg Oral Daily  . enoxaparin (LOVENOX) injection  40 mg Subcutaneous Q24H  . famotidine  20 mg Oral Daily  . furosemide  80 mg Oral BID  . insulin aspart  0-5 Units Subcutaneous QHS  . insulin aspart  0-9 Units Subcutaneous TID WC  . insulin detemir  10 Units  Subcutaneous Daily  . mycophenolate  500 mg Oral BID  . omega-3 acid ethyl esters  1 g Oral Daily  . piperacillin-tazobactam (ZOSYN)  IV  3.375 g Intravenous Q8H  . predniSONE  5 mg Oral BID  . pyridOXINE  200 mg Oral Daily  . sodium chloride  3 mL Intravenous Q12H  . vancomycin  1,000 mg Intravenous Q24H  . vitamin B-12  250 mcg Oral Daily  . vitamin C  500 mg Oral Daily    Assessment/Plan: 1. 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 Edema, weight gain, and evidence of volume expansion--Continue furosemide 80 BID. I don't think he needs extra IV fluids at this time.  Cr up a little today 3. Pericardial effusion--on colchicine. I think diuresis would help to lessen pericardial effusion 4. 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 Coronary artery disease--see above cardiac cath results. Per cardiology (followed by Dr. Dulce Sellar in [place]) 6. Cellulitis lower extremities--continue antibiotics. Diuretics will help here as well (in my opinion) 7. S./P. recent back surgery (Dr. Jana Hakim ask Dr. Gerlene Fee to see Mr. Ludington 8. Anemia--Fe/TIBC < 10%, ferritin 375.  Will Rx IV Fe, then begin aranesp 9. DM-2/hypoglycemia-per primary doctor's 10. High BP--BP not elevated now    LOS: 3 days   [name]  F 04/12/2013,8:28 AM   .labalb

## 2013-04-12 NOTE — Progress Notes (Signed)
Rehab Admissions Coordinator Note:  Patient was screened by Trish Mage for appropriateness for an Inpatient Acute Rehab Consult.  Noted PT/OT recommending CIR.  At this time, we are recommending Inpatient Rehab consult.  Trish Mage 04/12/2013, 1:43 PM  I can be reached at [phone].

## 2013-04-12 NOTE — Evaluation (Signed)
Occupational Therapy Evaluation Patient Details Name: [name] MRN: [id number] DOB: May 06, 1946 Today's Date: 04/12/2013 Time: 0932-1000 OT Time Calculation (min): 28 min  OT Assessment / Plan / Recommendation Clinical Impression  Pt admitted with SIRS.  Had recent decompressive lumbar laminectomy and has been mobilizing in a desk chair since due primarily L hip pain.  Pt has multiple comorbidities. He presents requiring  2 person assist for all mobility and dependence in bathing, dressing, and toileting and is significantly limited by pain.  Will follow acutely.  Pt will need post acute rehab for best outcome and return to independence.    OT Assessment  Patient needs continued OT Services    Follow Up Recommendations  CIR    Barriers to Discharge      Equipment Recommendations  None recommended by OT    Recommendations for Other Services Rehab consult  Frequency  Min 2X/week    Precautions / Restrictions Precautions Precautions: Back;Fall Precaution Comments: reviewed back precautions with bed mobility. Restrictions Weight Bearing Restrictions: No   Pertinent Vitals/Pain Severe, L hip, RN notified, repositioned    ADL  Eating/Feeding: Independent Where Assessed - Eating/Feeding: Chair Grooming: Set up Where Assessed - Grooming: Supported sitting Upper Body Bathing: Minimal assistance Where Assessed - Upper Body Bathing: Unsupported sitting Lower Body Bathing: +1 Total assistance Where Assessed - Lower Body Bathing: Unsupported sitting;Supported sit to stand Upper Body Dressing: Set up Where Assessed - Upper Body Dressing: Unsupported sitting Lower Body Dressing: +1 Total assistance Where Assessed - Lower Body Dressing: Unsupported sitting;Supported sit to stand Toilet Transfer: +2 Total assistance (bed to chair) Toilet Transfer: Patient Percentage: 70% Equipment Used: Gait belt;Rolling walker Transfers/Ambulation Related to ADLs: +2 total pt 70 % with RW and  verbal cues, took 3 steps toward [facility], sat and then transferred to chair ADL Comments: Pt's wife was assisting with LB ADL since back sx two weeks ago.  Pt was using a desk chair as his mean of mobility since back sx.    OT Diagnosis: Generalized weakness;Acute pain  OT Problem List: Decreased strength;Decreased activity tolerance;Impaired balance (sitting and/or standing);Decreased cognition;Decreased knowledge of use of DME or AE;Decreased knowledge of precautions;Pain;Impaired UE functional use OT Treatment Interventions: Self-care/ADL training;DME and/or AE instruction;Patient/family education;Balance training;Therapeutic activities   OT Goals Acute Rehab OT Goals OT Goal Formulation: With patient Time For Goal Achievement: 04/26/13 Potential to Achieve Goals: Good ADL Goals Pt Will Perform Grooming: with supervision;Standing at sink ADL Goal: Grooming - Progress: Goal set today Pt Will Perform Lower Body Bathing: with supervision;Sit to stand from bed;with adaptive equipment ADL Goal: Lower Body Bathing - Progress: Goal set today Pt Will Perform Lower Body Dressing: with supervision;Sit to stand from bed;with adaptive equipment ADL Goal: Lower Body Dressing - Progress: Goal set today Pt Will Transfer to Toilet: with supervision;Ambulation;Comfort height toilet;Grab bars;Maintaining back safety precautions ADL Goal: Toilet Transfer - Progress: Goal set today Pt Will Perform Toileting - Clothing Manipulation: with supervision;Standing ADL Goal: Toileting - Clothing Manipulation - Progress: Goal set today Pt Will Perform Toileting - Hygiene: with modified independence;Sitting on 3-in-1 or toilet ADL Goal: Toileting - Hygiene - Progress: Goal set today Pt Will Perform Tub/Shower Transfer: Shower transfer;with supervision;Ambulation;with DME;Maintaining back safety precautions ADL Goal: Tub/Shower Transfer - Progress: Goal set today Miscellaneous OT Goals Miscellaneous OT Goal #1: Pt will  perform bed mobility modified independently in prep for ADL. OT Goal: Miscellaneous Goal #1 - Progress: Goal set today  Visit Information  Last OT Received On: 04/12/13  Assistance Needed: +2 PT/OT Co-Evaluation/Treatment: Yes    Subjective Data  Subjective: "I spent the last 2 weeks rolling around in a desk chair." Patient Stated Goal: Pain relief, regain mobility and independence.   Prior Functioning     Home Living Lives With: Spouse Available Help at Discharge: Family Type of Home: House Home Access: Level entry Home Layout: One level Bathroom Shower/Tub: Health visitor: Handicapped height Bathroom Accessibility: Yes How Accessible: Accessible via wheelchair;Accessible via walker Home Adaptive Equipment: Grab bars around toilet;Grab bars in shower;Shower chair with back;Quad cane;Raised toilet seat with rails;Walker - rolling Additional Comments: Pt and wife built a fully handicap accessible house Prior Function Level of Independence: Needs assistance Needs Assistance: Bathing;Dressing (Lower body) Bath: Moderate Dressing: Moderate Able to Take Stairs?: No Driving: Yes Vocation: Retired Musician: No difficulties Dominant Hand: Left         Vision/Perception Vision - History Patient Visual Report: No change from baseline   Cognition  Cognition Arousal/Alertness: Awake/alert Behavior During Therapy: WFL for tasks assessed/performed Overall Cognitive Status: Within Functional Limits for tasks assessed    Extremity/Trunk Assessment Right Upper Extremity Assessment RUE ROM/Strength/Tone: WFL for tasks assessed RUE Coordination: Deficits RUE Coordination Deficits: difficulty opening packages Left Upper Extremity Assessment LUE ROM/Strength/Tone: WFL for tasks assessed LUE Coordination: Deficits LUE Coordination Deficits: difficulty opening packages   Mobility Bed Mobility Bed Mobility: Rolling Right;Right Sidelying to  Sit;Sitting - Scoot to Edge of Bed Rolling Right: 1: +2 Total assist Rolling Right: Patient Percentage: 20% Right Sidelying to Sit: 1: +2 Total assist Right Sidelying to Sit: Patient Percentage: 10% Sitting - Scoot to Edge of Bed: 2: Max assist Details for Bed Mobility Assistance: Pt severely limited by pain. +2 to ensure back precautions are followed Transfers Transfers: Sit to Stand;Stand to Sit Sit to Stand: 1: +2 Total assist;From bed Sit to Stand: Patient Percentage: 40% Stand to Sit: 3: Mod assist;To bed;To chair/3-in-1 Details for Transfer Assistance: Pt significantly limited by pain and requires assistance to raise trunk and place hands on walker.      Exercise     Balance Balance Balance Assessed: Yes Static Sitting Balance Static Sitting - Balance Support: Bilateral upper extremity supported;Feet supported Static Sitting - Level of Assistance: 5: Stand by assistance Static Sitting - Comment/# of Minutes: 5   End of Session OT - End of Session Activity Tolerance: Patient limited by pain Patient left: in chair;with call bell/phone within reach Nurse Communication: Patient requests pain meds  GO     Evern Bio 04/12/2013, 10:50 AM [phone]

## 2013-04-12 NOTE — Evaluation (Signed)
Physical Therapy Evaluation Patient Details Name: [name] MRN: [id number] DOB: 27-Aug-1946 Today's Date: 04/12/2013 Time: 7829-5621 PT Time Calculation (min): 45 min  PT Assessment / Plan / Recommendation Clinical Impression  Pt is a 67 yo male with recent admission ~2 weeks ago due s/p L3 discectomy with systemic inflammaotry response. Prior to back surgery, pt was independent with all ADLs. Now pt is significantly limited by pain in his back and L hip. The pain significantly limits OOB mobility and pt reports he is unable to independently complete bed mobility due to pain. Pt requires total assistance for bed mobility and transfers at this time. Pt lives alone with wife in an accessible home but would require 24/7 assistance for all mobility and ADL's for d/c. At this time, recommending CIR to improve mobility status and improve ability to complete ADLs independently.     PT Assessment  Patient needs continued PT services    Follow Up Recommendations  CIR    Equipment Recommendations  Rolling walker with 5" wheels    Recommendations for Other Services Rehab consult   Frequency Min 3X/week    Precautions / Restrictions Precautions Precautions: Back;Fall Restrictions Weight Bearing Restrictions: No   Pertinent Vitals/Pain Pt reports 12/10 pain in L hip but no pain in back in supine. Pt routinely calling out in pain and asking for pain medication      Mobility  Bed Mobility Bed Mobility: Rolling Right;Right Sidelying to Sit;Sitting - Scoot to Edge of Bed Rolling Right: 1: +2 Total assist Rolling Right: Patient Percentage: 20% Right Sidelying to Sit: 1: +2 Total assist Right Sidelying to Sit: Patient Percentage: 10% Sitting - Scoot to Edge of Bed: 2: Max assist Details for Bed Mobility Assistance: Pt severely limited by pain. +2 to ensure back precautions are followed Transfers Transfers: Sit to Stand;Stand to Sit;Stand Pivot Transfers Sit to Stand: 1: +2 Total  assist;From bed (x2) Sit to Stand: Patient Percentage: 40% (50% second time) Stand to Sit: 3: Mod assist;To bed;To chair/3-in-1 Stand Pivot Transfers: 1: +2 Total assist (for safety and v/c's for sequencing) Stand Pivot Transfers: Patient Percentage: 70% Details for Transfer Assistance: Pt significantly limited by pain and requires assistance to raise trunk and place hands on walker.     Exercises     PT Diagnosis: Generalized weakness;Difficulty walking;Acute pain  PT Problem List: Decreased strength;Decreased activity tolerance;Decreased range of motion;Decreased balance;Decreased mobility;Decreased coordination;Decreased knowledge of use of DME;Decreased knowledge of precautions;Pain PT Treatment Interventions: DME instruction;Functional mobility training;Therapeutic activities;Therapeutic exercise;Gait training;Balance training;Neuromuscular re-education   PT Goals Acute Rehab PT Goals PT Goal Formulation: With patient Time For Goal Achievement: 04/26/13 Potential to Achieve Goals: Good Pt will go Supine/Side to Sit: with min assist;with HOB 0 degrees PT Goal: Supine/Side to Sit - Progress: Goal set today Pt will go Sit to Supine/Side: with min assist;with HOB 0 degrees PT Goal: Sit to Supine/Side - Progress: Goal set today Pt will go Sit to Stand: with min assist;with upper extremity assist PT Goal: Sit to Stand - Progress: Goal set today Pt will go Stand to Sit: with min assist;with upper extremity assist PT Goal: Stand to Sit - Progress: Goal set today Pt will Transfer Bed to Chair/Chair to Bed: with min assist PT Transfer Goal: Bed to Chair/Chair to Bed - Progress: Goal set today Pt will Ambulate: 16 - 50 feet;with min assist;with least restrictive assistive device PT Goal: Ambulate - Progress: Goal set today  Visit Information  Last PT Received On: 04/12/13 Assistance Needed: +  2 PT/OT Co-Evaluation/Treatment: Yes    Subjective Data  Subjective: Pt reports necause of his  hip and back pain he cannot adjust himself in bed. Calling the nurse for pain pills at the start of session. Pt reports the beginning of pain in the L thigh and hip. 12/10 L hip pain, no back pain when laying still  Patient Stated Goal: To walk   Prior Functioning  Home Living Lives With: Spouse Available Help at Discharge: Family Type of Home: House Home Access: Level entry Home Layout: One level Bathroom Shower/Tub: Health visitor: Handicapped height Bathroom Accessibility: Yes How Accessible: Accessible via wheelchair;Accessible via walker Home Adaptive Equipment: Grab bars around toilet;Grab bars in shower;Shower chair with back;Quad cane;Raised toilet seat with rails;Walker - rolling Additional Comments: Pt and wife built a fully handicap accessible house Prior Function Level of Independence: Independent with assistive device(s) Able to Take Stairs?: No Driving: Yes Vocation: Retired Musician: No difficulties Dominant Hand: Left    Cognition  Cognition Arousal/Alertness: Awake/alert Behavior During Therapy: WFL for tasks assessed/performed Overall Cognitive Status: Within Functional Limits for tasks assessed    Extremity/Trunk Assessment Right Upper Extremity Assessment RUE ROM/Strength/Tone: Within functional levels Left Upper Extremity Assessment LUE ROM/Strength/Tone: Within functional levels Right Lower Extremity Assessment RLE ROM/Strength/Tone: WFL for tasks assessed RLE Sensation: WFL - Light Touch Left Lower Extremity Assessment LLE ROM/Strength/Tone: WFL for tasks assessed LLE Sensation: WFL - Light Touch   Balance Balance Balance Assessed: Yes Static Sitting Balance Static Sitting - Balance Support: Bilateral upper extremity supported;Feet supported Static Sitting - Level of Assistance: 5: Stand by assistance Static Sitting - Comment/# of Minutes: 5  End of Session PT - End of Session Equipment Utilized During  Treatment: Gait belt Activity Tolerance: Patient limited by pain Patient left: in chair;with call bell/phone within reach;with nursing in room Nurse Communication: Patient requests pain meds  GP     04/12/2013, 10:21 AM Marvis Moeller, Student Physical Therapist Office #: [phone]  [place], PT DPT [phone]

## 2013-04-12 NOTE — Progress Notes (Signed)
Pharmacist Heart Failure Core Measure Documentation  Assessment: [name] has an EF documented as 30% on 5/14 by ECHO.  Rationale: Heart failure patients with left ventricular systolic dysfunction (LVSD) and an EF < 40% should be prescribed an angiotensin converting enzyme inhibitor (ACEI) or angiotensin receptor blocker (ARB) at discharge unless a contraindication is documented in the medical record.  This patient is not currently on an ACEI or ARB for HF.  This note is being placed in the record in order to provide documentation that a contraindication to the use of these agents is present for this encounter.  ACE Inhibitor or Angiotensin Receptor Blocker is contraindicated (specify all that apply)  []   ACEI allergy AND ARB allergy []   Angioedema []   Moderate or severe aortic stenosis []   Hyperkalemia []   Hypotension []   Renal artery stenosis [x]   Worsening renal function, preexisting renal disease or dysfunction   [name] 04/12/2013 12:16 PM

## 2013-04-12 NOTE — Progress Notes (Signed)
Patient Name: [name] Date of Encounter: 04/12/2013    Principal Problem:   SIRS (systemic inflammatory response syndrome) Active Problems:   History of renal transplant   HTN (hypertension)   Diabetes mellitus type 2, controlled   Cellulitis of right lower extremity   CAD (coronary artery disease)   Renal failure (ARF), acute on chronic kidney disease stage 3   Dehydration   Acute respiratory failure with hypoxia   Hx of decompressive lumbar laminectomy (L-3) May12, 2014   Pericardial effusion   SUBJECTIVE  He feels better today. He is not tachycardiac. IVFs were stopped. Lasix was started.  CURRENT MEDS . ipratropium  0.5 mg Nebulization BID   And  . albuterol  2.5 mg Nebulization BID  . antiseptic oral rinse  15 mL Mouth Rinse BID  . aspirin EC  325 mg Oral Daily  . atorvastatin  40 mg Oral q1800  . carvedilol  12.5 mg Oral BID WC  . colchicine  0.6 mg Oral Daily  . darbepoetin (ARANESP) injection - NON-DIALYSIS  100 mcg Subcutaneous Q Thu-1800  . enoxaparin (LOVENOX) injection  40 mg Subcutaneous Q24H  . famotidine  20 mg Oral Daily  . ferumoxytol  510 mg Intravenous Q72H  . furosemide  80 mg Intravenous Q12H  . insulin aspart  0-5 Units Subcutaneous QHS  . insulin aspart  0-9 Units Subcutaneous TID WC  . insulin detemir  10 Units Subcutaneous Daily  . mycophenolate  500 mg Oral BID  . omega-3 acid ethyl esters  1 g Oral Daily  . piperacillin-tazobactam (ZOSYN)  IV  3.375 g Intravenous Q8H  . predniSONE  5 mg Oral BID  . pyridOXINE  200 mg Oral Daily  . sodium chloride  3 mL Intravenous Q12H  . vancomycin  1,000 mg Intravenous Q24H  . vitamin B-12  250 mcg Oral Daily  . vitamin C  500 mg Oral Daily   OBJECTIVE  Filed Vitals:   04/12/13 0017 04/12/13 0353 04/12/13 0831 04/12/13 1223  BP: 116/84 145/74 150/75 154/79  Pulse: 69 73 70 76  Temp: 97.9 F (36.6 C) 98.3 F (36.8 C) 98.4 F (36.9 C) 98.2 F (36.8 C)  TempSrc: Axillary Oral Oral Oral    Resp: 13 20 14 15   Height:      Weight: 95.6 kg (210 lb 12.2 oz)     SpO2: 96% 98% 96% 100%    Intake/Output Summary (Last 24 hours) at 04/12/13 1338 Last data filed at 04/12/13 1100  Gross per 24 hour  Intake    780 ml  Output   1475 ml  Net   -695 ml   Filed Weights   04/10/13 0630 04/11/13 0318 04/12/13 0017  Weight: 88.9 kg (195 lb 15.8 oz) 94.8 kg (208 lb 15.9 oz) 95.6 kg (210 lb 12.2 oz)   PHYSICAL EXAM  General: Pleasant, NAD. Neuro: Alert and oriented X 3. Moves all extremities spontaneously. Psych: Normal affect. HEENT:  Normal  Neck: Supple without bruits.  Mod elevated JVP. Lungs:  Resp regular and unlabored, CTA. Heart: RRR no s3, s4, 2/6 syst murmur @ apex with rub. Abdomen: Soft, non-tender, non-distended, BS + x 4.  Extremities: No clubbing, cyanosis.  1+ bilat LEE with erythema/cellulitis of the RLE.  DP/PT/Radials 1+ and equal bilaterally.  Accessory Clinical Findings  CBC  Recent Labs  04/09/13 1855  04/11/13 0430 04/12/13 0355  WBC 17.8*  < > 10.4 9.8  NEUTROABS 16.0*  --   --   --  HGB 10.4*  < > 8.5* 8.2*  HCT 33.1*  < > 27.8* 26.4*  MCV 94.6  < > 94.9 95.7  PLT 157  < > 106* 85*  < > = values in this interval not displayed. Basic Metabolic Panel  Recent Labs  04/11/13 0430 04/12/13 0355  NA 135 136  K 4.3 4.6  CL 103 101  CO2 19 21  GLUCOSE 208* 119*  BUN 58* 60*  CREATININE 2.38* 2.77*  CALCIUM 7.9* 8.6  PHOS  --  5.2*   Liver Function Tests  Recent Labs  04/09/13 1855 04/10/13 0200 04/12/13 0355  AST 29 28  --   ALT 20 16  --   ALKPHOS 66 57  --   BILITOT 0.4 0.3  --   PROT 5.3* 4.3*  --   ALBUMIN 2.9* 2.3* 1.9*   Cardiac Enzymes  Recent Labs  04/10/13 0200 04/10/13 0958  TROPONINI <0.30 <0.30   D-Dimer  Recent Labs  04/09/13 1856  DDIMER >20.00*   TELE  Sinus/pod.  ECG  V-paced, 78, no acute changes.  2D Echocardiogram 5.27.2014 Study Conclusions  - Left ventricle: The cavity size was  moderately dilated.   There was mild focal basal hypertrophy of the septum.   Systolic function was moderately to severely reduced. The   estimated ejection fraction was in the range of 30% to   35%. There is akinesis of the inferior posterior   myocardium. There is hypokinesis of the lateral   myocardium. Features are consistent with a pseudonormal   left ventricular filling pattern, with concomitant   abnormal relaxation and increased filling pressure (grade   2 diastolic dysfunction). - Aortic valve: Trivial regurgitation. - Mitral valve: Moderate regurgitation. - Left atrium: The atrium was moderately dilated. - Right ventricle: The cavity size was mildly dilated.   Systolic function was severely reduced. - Right atrium: The atrium was mildly dilated. - Pulmonary arteries: PA peak pressure: 39mm Hg (S). - Pericardium, extracardiac: A large pericardial effusion   was identified with mild RA collapse; no RV   diastolic collapse; IVC mildly dilated with blunted   response to inspiration. I_____________  Bilateral LE U/S 5.27.2014  Bilateral lower extremity venous duplex  completed.    Preliminary report:  Bilateral:  No evidence of DVT, superficial thrombosis, or [name]'s Cyst. _____________  Radiology/Studies   CT Lumbar Spine Wo Contrast   IMPRESSION:  1.  Interval postsurgical changes at L3-L4. An epidural air fluid collection extends inferiorly from the disc space behind the L4 vertebral body and exerts mass effect on the thecal sac.  As this appears contiguous with the L3-L4 disc which demonstrates vacuum phenomenon, this may reflect an extruded disc fragment.  I cannot exclude an epidural abscess, although no generalized inflammatory changes are identified. There is persistent foraminal narrowing at L3-L4, worse on the left. 2.  No CT evidence of osteomyelitis. 3.  Stable disc protrusions at L4-L5 and L5-S1. 4.  Apparent hydronephrosis of the native right renal collecting  system of uncertain significance and etiology.   Original Report Authenticated By: Carey Bullocks, M.D.   Ct Knee Right Wo Contrast  04/10/2013   *RADIOLOGY REPORT*  Clinical Data: Knee swelling and fever.  Question osteomyelitis.  CT OF THE RIGHT KNEE WITHOUT CONTRAST   IMPRESSION:  1.  No CT evidence of osteomyelitis.  Please note that CT scan is not sensitive for the detection of osteomyelitis. 2.  Small joint effusion.  No focal fluid collection is present. 3.  Subcutaneous edema diffusely about the knee is likely due to dependent change or possibly cellulitis. 4.  Atherosclerosis.   Original Report Authenticated By: Holley Dexter, M.D.   Nm Pulmonary Perf And Vent  04/10/2013   *RADIOLOGY REPORT*  Clinical Data:  Shortness of breath, history hypertension, diabetes, coronary artery disease post MI and coronary angioplasty, CHF  NUCLEAR MEDICINE VENTILATION - PERFUSION LUNG SCAN  IMPRESSION: Very low probability for pulmonary embolism.   Original Report Authenticated By: [name], M.D.   Dg Chest Port 1 View  04/09/2013   *RADIOLOGY REPORT*  Clinical Data: Hypoglycemia and shortness of breath  PORTABLE CHEST - 1 VIEW   IMPRESSION: Decreased lung volumes and central venous congestion. Cardiomegaly.   Original Report Authenticated By: Genevive Bi, M.D.    ASSESSMENT AND PLAN  1.  Large pericardial effusion:  No evidence of tamponade. Etiology of effusion is not clear but could be due to CKD.  colchicine 0.6mg  daily was added. A repeat echo was done today which was personally reviewed by me. The effusion is still large but stable in size. Some respiratory variations in mitral inflow but no RV of RA collapse.  Recommend repeat echo next week.  He might require pericardial window.  Avoid over diuresis.   2.  Sirs/Cellulitis:  Abx per IM. Seems to be improving.   3.  Acute on chronic systolic CHF/cardiomyopathy/CAD:  He is s/p PTCA x 2 in 1991 and then arrest last May with cath (no  intervention) followed by BiV ICD placement.  He has been having weight gain, edema, and DOE.  No orthopnea, pnd.  He seems to be significantly volume overload with significant weight gain.   Cont bb, asa, statin.  No acei/arb 2/2 CKD. He was started Lasix. Nephrology following.   4.  CKD s/p transplant:  Creat 2.45.  He says that this normally runs 2.6-2.7 since last year, thus he is at baseline.  Cont cellcept/prednisone.  5.  Lumbar/L Leg Pain:  Was taking prn percocet @ home - resume.  6.  HTN:  Stable.  7.  DM: per IM.  8.  Anemia:  Heparin off.  Signed,  Lorine Bears, MD 04/12/2013 1:38 PM

## 2013-04-12 NOTE — Progress Notes (Signed)
Physical Therapy Treatment Patient Details Name: [name] MRN: [id number] DOB: December 15, 1945 Today's Date: 04/12/2013 Time: 7829-5621 PT Time Calculation (min): 14 min  PT Assessment / Plan / Recommendation Comments on Treatment Session  Pt able to improve sit <> stand and overall mobility.  Pt reports improved pain compared to evaluation in AM.  Continue to recommend inpatient rehab at d/c for further strengthening, gait training and overall mobility to prepare for safe d/c home with wife.    Follow Up Recommendations  CIR     Equipment Recommendations  Rolling walker with 5" wheels    Recommendations for Other Services Rehab consult  Frequency Min 3X/week   Plan Discharge plan remains appropriate;Frequency remains appropriate    Precautions / Restrictions Precautions Precautions: Back;Fall Precaution Comments: reviewed back precautions with bed mobility. Restrictions Weight Bearing Restrictions: No   Pertinent Vitals/Pain 3/10 with transfer in bilateral upper thighs    Mobility  Bed Mobility Sit to Sidelying Right: 1: +2 Total assist;HOB flat Sit to Sidelying Right: Patient Percentage: 40% Details for Bed Mobility Assistance: +2 (A) to elevate LE into bed and slowly descend trunk.  Max cues for technique to prevent twisting. Transfers Transfers: Sit to Stand;Stand to Sit;Stand Pivot Transfers Sit to Stand: 1: +2 Total assist;From chair/3-in-1 Sit to Stand: Patient Percentage: 50% Stand to Sit: 3: Mod assist;To bed Stand Pivot Transfers: 1: +2 Total assist Stand Pivot Transfers: Patient Percentage: 70% Details for Transfer Assistance: +2 (A) to initiate transfer from lower surface with max cues for hand placement and LE placment.  Pt having difficulty with bilateral knee flexion for transfer.   Ambulation/Gait Ambulation/Gait Assistance: 1: +2 Total assist Ambulation/Gait: Patient Percentage: 70% Ambulation Distance (Feet): 4 Feet (4 side steps) Assistive device:  Rolling walker Ambulation/Gait Assistance Details: +2 (A) to maintain balance and cues for RW placement  Gait Pattern: Step-to pattern;Shuffle;Antalgic Gait velocity: decreased Stairs: No Wheelchair Mobility Wheelchair Mobility: No    Exercises     PT Diagnosis:    PT Problem List:   PT Treatment Interventions:     PT Goals Acute Rehab PT Goals PT Goal Formulation: With patient Time For Goal Achievement: 04/26/13 Potential to Achieve Goals: Good Pt will go Supine/Side to Sit: with min assist;with HOB 0 degrees PT Goal: Supine/Side to Sit - Progress: Progressing toward goal Pt will go Sit to Supine/Side: with min assist;with HOB 0 degrees PT Goal: Sit to Supine/Side - Progress: Progressing toward goal Pt will go Sit to Stand: with min assist;with upper extremity assist PT Goal: Sit to Stand - Progress: Progressing toward goal Pt will go Stand to Sit: with min assist;with upper extremity assist PT Goal: Stand to Sit - Progress: Progressing toward goal Pt will Transfer Bed to Chair/Chair to Bed: with min assist PT Transfer Goal: Bed to Chair/Chair to Bed - Progress: Progressing toward goal Pt will Ambulate: 16 - 50 feet;with min assist;with least restrictive assistive device PT Goal: Ambulate - Progress: Progressing toward goal  Visit Information  Last PT Received On: 04/12/13 Assistance Needed: +2    Subjective Data  Subjective: Pt reports decrease overall pain this PM with 3/10 being the highest in bilateral thighs. Patient Stated Goal: To walk   Cognition  Cognition Arousal/Alertness: Awake/alert Behavior During Therapy: WFL for tasks assessed/performed Overall Cognitive Status: Within Functional Limits for tasks assessed    Balance     End of Session PT - End of Session Equipment Utilized During Treatment: Gait belt Activity Tolerance: Patient tolerated treatment well Patient  left: with call bell/phone within reach;in chair Nurse Communication: Mobility status    GP     Pessy Delamar 04/12/2013, 5:11 PM Jake Shark, PT DPT [phone]

## 2013-04-13 LAB — GLUCOSE, CAPILLARY
Glucose-Capillary: 136 mg/dL — ABNORMAL HIGH (ref 70–99)
Glucose-Capillary: 137 mg/dL — ABNORMAL HIGH (ref 70–99)
Glucose-Capillary: 125 mg/dL — ABNORMAL HIGH (ref 70–99)

## 2013-04-13 LAB — COMPREHENSIVE METABOLIC PANEL
CO2: 21 mEq/L (ref 19–32)
Calcium: 8.8 mg/dL (ref 8.4–10.5)
Creatinine, Ser: 2.93 mg/dL — ABNORMAL HIGH (ref 0.50–1.35)
GFR calc Af Amer: 25 mL/min — ABNORMAL LOW (ref >90)
GFR calc non Af Amer: 21 mL/min — ABNORMAL LOW (ref >90)
Glucose, Bld: 165 mg/dL — ABNORMAL HIGH (ref 70–99)
Sodium: 135 mEq/L (ref 135–145)
Total Protein: 4.9 g/dL — ABNORMAL LOW (ref 6.0–8.3)

## 2013-04-13 LAB — CBC
Hemoglobin: 8.2 g/dL — ABNORMAL LOW (ref 13.0–17.0)
Platelets: 96 10*3/uL — ABNORMAL LOW (ref 150–400)
RBC: 2.81 MIL/uL — ABNORMAL LOW (ref 4.22–5.81)
WBC: 8 10*3/uL (ref 4.0–10.5)

## 2013-04-13 LAB — MAGNESIUM: Magnesium: 1.8 mg/dL (ref 1.5–2.5)

## 2013-04-13 MED ORDER — FUROSEMIDE 10 MG/ML IJ SOLN
100.0000 mg | Freq: Four times a day (QID) | INTRAVENOUS | Status: DC
  Administered 2013-04-13 – 2013-04-15 (×7): 100 mg via INTRAVENOUS
  Filled 2013-04-13 (×12): qty 10

## 2013-04-13 NOTE — Progress Notes (Signed)
  Echocardiogram 2D Echocardiogram has been performed.  [name] 04/13/2013, 11:18 AM

## 2013-04-13 NOTE — Progress Notes (Signed)
Report called to [facility] on unit 2000. Patient to be transported to room 2032 via wheelchair .

## 2013-04-13 NOTE — Progress Notes (Signed)
Delay in patient transfer due to RN  caring for another patient and NT was off unit. Attempted to make receiving unit aware but no answer called three times with no response. Patient transferred to RM 2032 via wheelchair.

## 2013-04-13 NOTE — Progress Notes (Signed)
[facility] Progress Note [place] TEAM 1 - Stepdown/ICU TEAM   [name] BJY:[id number] DOB: [birth year] DOA: 04/09/2013 PCP: [name], MD  Brief narrative: 67 year old male patient on chronic immunosuppression after renal transplant remotely. Had lumbar decompression surgery 2 weeks prior to admission. Patient's wife brought him to the ER because he was confused and short of breath. In the ER patient was febrile and he was noted to have right lower extremity erythematous changes that appear to be consistent with cellulitis. Patient had a prior history of left septic knee and had been followed by ID at that time. Patient was also hypoglycemic with a blood sugar of 50. 24 hours prior to presenting to the ER patient had been doing well since his surgery. He had been having right knee pain but denied any back pain at the surgical site and had not had any drainage from the surgical site. Chest x-ray showed possible central vascular congestion - the patient had mildly elevated troponin. He also had a paced rhythm with a bundle branch block. He also appeared to be in acute renal failure.  Assessment/Plan:    SIRS (systemic inflammatory response syndrome) -likely source RLE cellulitis -CT lumbar spine ? possible abscess (epidural air fluid collection) but no gen inflammatory changes - not felt to be infectious changes per Neurosurgery -cont empiric anbx's and supportive care; follow all cx's    Cellulitis of right lower extremity -CT knee without osteo but MRI preferred imaging and can't obtain due to pacemaker -tiny joint effusion and SQ edema but no focal fluid collection -cont empiric anbx's -clinically improving    Renal failure (ARF), acute on chronic kidney disease stage 3 -nephrology clarified with pt's primary nephrologist that baseline Scr is 1.8 -Volume overloaded - PO Lasix started 5/28 and changed to IV dosing 5/29 -Scr rising but needs add'l diuresis so Renal increased  Lasix dose to 100 mg TID 5/30 - expect with diuresis Scr should decrease -appreciate Renal assistance    Demand ischemia/CAD (coronary artery disease) -subsequent enzymes have been normal and no CP -cont Coreg and ASA    Systolic Heart Failure/EF 30-35%/Grade 2 Diastolic Heart Failure -baseline EF 25% in March 2013 after PEA arrest requiring PPM/ICD -cont coreg-doubt can use ACE with CKD and h/o renal transplant -Cards following -diuresing with IV Lasix     Large Pericardial Effusion -cards managing - seen on ECHO this admit -started on Colchicine per Cards 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 -IV iron this admit -hgb stable -was on oral B12 pre admit- check level here- may not be absorbing thru gut and may need injectable replacement    Positive D dimer -VQ scan with VERY low probability for PE     History of renal transplant -cont Prednisone and Cellcept for now -Nephrology following     Acute respiratory failure with hypoxia   -improved - oxygen weaned to RA     Exopthalmus -TSH 0.795    PVD -had purple discolored areas to left great toe and bases of toes left foot and foot cool to touch 5/27 - 5/28 edema and pallor/cooleness resolved -doppler pulse present -pt endorsed vascular studies 4 months ago at [facility] -follow    HTN (hypertension) -BP controlled  -cont home meds    Diabetes mellitus type 2, controlled/hypoglycemia -Levemir at home on hold -CBG much improved at this time  Hx of decompressive lumbar laminectomy (L-3) May12, 2014 -?of epidural abscess as per CT scan of spine -pain better after adjustments in narcs 5/28 -formally consulted Dr.Kritzer - he has reviewed the CT lumbar spine and does not feel changes are c/w infection -PT/OT recommending CIR when medically stable  History of atrial fib -occurred March 2013- pt refused anti coagulation  RUE  edema -follow- had IV in same arm and if edema persists or worsens despite diuiresis may need venous duplex to r/o DVT -currently decreasing with elevation and diuresis  DVT prophylaxis:  Lovenox   Code Status: Full Family Communication: Patient Disposition Plan: transfer to tele bed  Isolation: None  Consultants: Dr. Gerlene Fee with Neurosurgery Cardiology Nephrology  Procedures: 2-D echocardiogram  - Left ventricle: The cavity size was moderately dilated. There was mild focal basal hypertrophy of the septum. Systolic function was moderately to severely reduced. The estimated ejection fraction was in the range of 30% to 35%. There is akinesis of the inferior posterior myocardium. There is hypokinesis of the lateral myocardium. Features are consistent with a pseudonormal left ventricular filling pattern, with concomitant abnormal relaxation and increased filling pressure (grade 2 diastolic dysfunction). - Aortic valve: Trivial regurgitation. - Mitral valve: Moderate regurgitation. - Left atrium: The atrium was moderately dilated. - Right ventricle: The cavity size was mildly dilated. Systolic function was severely reduced. - Right atrium: The atrium was mildly dilated. - Pulmonary arteries: PA peak pressure: 39mm Hg (S). - Pericardium, extracardiac: A large pericardial effusion was identified. Impressions: - Inferoposterior akinesis and lateral hypokinesis with moderate to severe reduction in LV function; MR difficult to quantitate due to eccentric nature of jet but probably moderate; RAE/RVE with severely reduced RV function; large pericardial effusion with mild RA collapse; no RV diastolic collapse; IVC mildly dilated with blunted response to inspiration.  Lower extremity venous duplex  negative for DVT  Antibiotics: Zosyn 5/26 >>> Vancomycin 5/26 >>> 5/30  HPI/Subjective: Patient alert and now reports significant improvement in low back pain and hip pain. No SOB  or CP.  Denies loss of strength in lower extremities or loss of sensation in lower extremities.  Objective: Blood pressure 165/82, pulse 84, temperature 98.2 F (36.8 C), temperature source Oral, resp. rate 19, height 6\' 1"  (1.854 m), weight 95.6 kg (210 lb 12.2 oz), SpO2 97.00%.  Intake/Output Summary (Last 24 hours) at 04/13/13 1356 Last data filed at 04/13/13 1249  Gross per 24 hour  Intake    250 ml  Output   2300 ml  Net  -2050 ml    Exam: General: No acute respiratory distress Lungs: Clear to auscultation bilaterally without wheezes or crackles, RA Cardiovascular: Regular rate and ventricular paced rhythm without murmur gallop or rub normal S1 and S2, no JVD but does have 2+ pitting edema of right foot Abdomen: Nontender, nondistended, soft, bowel sounds positive, no rebound, no ascites, no appreciable mass Musculoskeletal: No significant cyanosis, clubbing of bilateral lower extremities Neurological: Alert and oriented x 3, moves all extremities x 4 without focal neurological deficits, CN 2-12 intact Integumentary: Patient has diffuse papular scaly lesions throughout entire body that he reports has been present for greater than 20 years; has decreased erythematous skin changes on the right lower extremity extending from the foot to below the knee - resolved erythematous changes on the left lower extremity mainly involving the calf  Scheduled Meds: Scheduled Meds: . ipratropium  0.5 mg Nebulization BID   And  . albuterol  2.5 mg Nebulization BID  .  antiseptic oral rinse  15 mL Mouth Rinse BID  . aspirin EC  325 mg Oral Daily  . atorvastatin  40 mg Oral q1800  . carvedilol  12.5 mg Oral BID WC  . colchicine  0.6 mg Oral Daily  . darbepoetin (ARANESP) injection - NON-DIALYSIS  100 mcg Subcutaneous Q Thu-1800  . enoxaparin (LOVENOX) injection  40 mg Subcutaneous Q24H  . famotidine  20 mg Oral Daily  . ferumoxytol  510 mg Intravenous Q72H  . furosemide  100 mg Intravenous Q6H   . insulin aspart  0-5 Units Subcutaneous QHS  . insulin aspart  0-9 Units Subcutaneous TID WC  . insulin detemir  10 Units Subcutaneous Daily  . mycophenolate  500 mg Oral BID  . omega-3 acid ethyl esters  1 g Oral Daily  . piperacillin-tazobactam (ZOSYN)  IV  3.375 g Intravenous Q8H  . predniSONE  5 mg Oral BID  . pyridOXINE  200 mg Oral Daily  . sodium chloride  3 mL Intravenous Q12H  . vancomycin  750 mg Intravenous Q24H  . vitamin B-12  250 mcg Oral Daily  . vitamin C  500 mg Oral Daily   Data Reviewed: Basic Metabolic Panel:  Recent Labs Lab 04/09/13 1855 04/10/13 0200 04/11/13 0430 04/12/13 0355 04/13/13 0543  NA 140 139 135 136 135  K 4.7 5.0 4.3 4.6 4.4  CL 103 104 103 101 100  CO2 24 21 19 21 21   GLUCOSE 55* 64* 208* 119* 165*  BUN 63* 62* 58* 60* 62*  CREATININE 2.44* 2.45* 2.38* 2.77* 2.93*  CALCIUM 9.1 8.2* 7.9* 8.6 8.8  MG  --   --   --   --  1.8  PHOS  --   --   --  5.2* 5.6*   Liver Function Tests:  Recent Labs Lab 04/09/13 1855 04/10/13 0200 04/12/13 0355 04/13/13 0543  AST 29 28  --  24  ALT 20 16  --  22  ALKPHOS 66 57  --  83  BILITOT 0.4 0.3  --  0.2*  PROT 5.3* 4.3*  --  4.9*  ALBUMIN 2.9* 2.3* 1.9* 1.9*   CBC:  Recent Labs Lab 04/09/13 1855 04/10/13 0845 04/11/13 0430 04/12/13 0355 04/13/13 0543  WBC 17.8* 15.1* 10.4 9.8 8.0  NEUTROABS 16.0*  --   --   --   --   HGB 10.4* 8.4* 8.5* 8.2* 8.2*  HCT 33.1* 26.9* 27.8* 26.4* 26.2*  MCV 94.6 94.7 94.9 95.7 93.2  PLT 157 115* 106* 85* 96*   Cardiac Enzymes:  Recent Labs Lab 04/10/13 0200 04/10/13 0958  TROPONINI <0.30 <0.30   BNP (last 3 results)  Recent Labs  04/09/13 1856  PROBNP 21940.0*   CBG:  Recent Labs Lab 04/12/13 1215 04/12/13 1620 04/12/13 2110 04/13/13 0828 04/13/13 1258  GLUCAP 138* 152* 162* 136* 137*    Recent Results (from the past 240 hour(s))  CULTURE, BLOOD (ROUTINE X 2)     Status: None   Collection Time    04/09/13  6:45 PM       Result Value Range Status   Specimen Description BLOOD RIGHT ARM   Final   Special Requests BOTTLES DRAWN AEROBIC ONLY [name]   Final   Culture  Setup Time 04/10/2013 01:04   Final   Culture     Final   Value:        BLOOD CULTURE RECEIVED NO GROWTH TO DATE CULTURE WILL BE HELD FOR 5 DAYS BEFORE ISSUING  A FINAL NEGATIVE REPORT   Report Status PENDING   Incomplete  CULTURE, BLOOD (ROUTINE X 2)     Status: None   Collection Time    04/09/13  6:50 PM      Result Value Range Status   Specimen Description BLOOD RIGHT WRIST   Final   Special Requests BOTTLES DRAWN AEROBIC ONLY 2CC   Final   Culture  Setup Time 04/10/2013 01:04   Final   Culture     Final   Value:        BLOOD CULTURE RECEIVED NO GROWTH TO DATE CULTURE WILL BE HELD FOR 5 DAYS BEFORE ISSUING A FINAL NEGATIVE REPORT   Report Status PENDING   Incomplete  URINE CULTURE     Status: None   Collection Time    04/09/13  7:44 PM      Result Value Range Status   Specimen Description URINE, CATHETERIZED   Final   Special Requests NONE   Final   Culture  Setup Time 04/09/2013 20:44   Final   Colony Count NO GROWTH   Final   Culture NO GROWTH   Final   Report Status 04/10/2013 FINAL   Final  MRSA PCR SCREENING     Status: None   Collection Time    04/10/13 12:04 AM      Result Value Range Status   MRSA by PCR NEGATIVE  NEGATIVE Final   Comment:            The GeneXpert MRSA Assay (FDA     approved for NASAL specimens     only), is one component of a     comprehensive MRSA colonization     surveillance program. It is not     intended to diagnose MRSA     infection nor to guide or     monitor treatment for     MRSA infections.     Studies:  Recent x-ray studies have been reviewed in detail by the Attending Physician  Scheduled Meds:  Reviewed in detail by the Attending Physician   Junious Silk, ANP [facility] Office  [phone] Pager [phone]  On-Call/Text Page:      [email or web address]      password  [email or web address]  If 7PM-7AM, please contact night-coverage [email or web address] Password [email or web address] 04/13/2013, 1:56 PM   LOS: 4 days   I have personally examined this patient and reviewed the entire database. I have reviewed the above note, made any necessary editorial changes, and agree with its content.  Lonia Blood, MD [facility]

## 2013-04-13 NOTE — Progress Notes (Signed)
Attempt to call report charge nurse will take report but is currently at bedside with a patient. Awaiting a return call.

## 2013-04-13 NOTE — Progress Notes (Signed)
Patient ID: [name], male   DOB: 05/24/1946, 67 y.o.   MRN: [id number] Subjective: Patient reports feeling better  Objective: Vital signs in last 24 hours: Temp:  [98 F (36.7 C)-98.6 F (37 C)] 98 F (36.7 C) (05/30 0418) Pulse Rate:  [76-90] 83 (05/30 0800) Resp:  [15-22] 22 (05/30 0800) BP: (140-159)/(72-82) 159/80 mmHg (05/30 0800) SpO2:  [96 %-100 %] 98 % (05/30 0800) Weight:  [95.6 kg (210 lb 12.2 oz)] 95.6 kg (210 lb 12.2 oz) (05/30 0418)  Intake/Output from previous day: 05/29 0701 - 05/30 0700 In: 610 [P.O.:360; IV Piggyback:250] Out: 1800 [Urine:1800] Intake/Output this shift:    No new neuro issues; wound looks excellent  Lab Results:  Recent Labs  04/12/13 0355 04/13/13 0543  WBC 9.8 8.0  HGB 8.2* 8.2*  HCT 26.4* 26.2*  PLT 85* 96*   BMET  Recent Labs  04/12/13 0355 04/13/13 0543  NA 136 135  K 4.6 4.4  CL 101 100  CO2 21 21  GLUCOSE 119* 165*  BUN 60* 62*  CREATININE 2.77* 2.93*  CALCIUM 8.6 8.8    Studies/Results: US Renal  04/12/2013   *RADIOLOGY REPORT*  Clinical Data: Kidney transplant with elevated creatinine. Evaluate for hydronephrosis. History of fluid overload.  RENAL/URINARY TRACT ULTRASOUND COMPLETE  Comparison:  Lumbar spine CT 04/10/2013 and abdominal CT 05/19/2008  Findings:  Right Kidney:  The native right kidney is small and echogenic.  The right kidney measures 7.7 cm. Difficult to exclude hydronephrosis in the native right kidney.  Cyst in the right kidney measures up to 2.5 cm.  Left Kidney:  Atrophic and echogenic.  Left kidney roughly measures 7.5 cm in length.  Lower pole is difficult to evaluate.  Transplant kidney: The transplant kidney measures 12.4 cm in length.  There are hypoechoic cysts associated with the transplant kidney and a small amount of adjacent ascites.  Largest cyst roughly measures 2.7 cm.  Bladder:  Fluid filled and unremarkable.  Other findings:  There are bilateral pleural effusions. Evidence for an  echogenic stone with posterior acoustic shadowing at the base of the gallbladder.  IMPRESSION: No significant hydronephrosis involving the transplant kidney. There appears to be cortical cysts involving the transplant kidney as described.  Bilateral native kidneys are atrophic and echogenic as expected. Evidence for bilateral renal cortical cysts. Difficult to exclude hydronephrosis of the native right kidney.  Bilateral pleural effusions and ascites.  Cholelithiasis.   Original Report Authenticated By: Richarda Overlie, M.D.   Dg Chest Port 1 View  04/11/2013   *RADIOLOGY REPORT*  Clinical Data: Shortness of breath, evaluate for volume overload  PORTABLE CHEST - 1 VIEW  Comparison: 04/09/2013; 07/27/2012  Findings: Grossly unchanged marked enlargement of the cardiac silhouette and mediastinal contours.  Stable positioning of support apparatus.  Chronic mild pulmonary venous congestion without frank evidence of edema.  Grossly unchanged bilateral medial basilar heterogeneous opacities.  No new focal airspace opacities.  No definite pleural effusion or pneumothorax.  Unchanged bones.  IMPRESSION:  Chronic findings of cardiomegaly and pulmonary venous congestion without definite evidence of acute superimposed worsening pulmonary edema.   Original Report Authenticated By: Tacey Ruiz, MD    Assessment/Plan: Patient well known to me. Had bilateral laminectomy and discectomy 2 weeks ago for severe stenosis and hernaited disc. Has had more pain post op than expected. Recently admitted by hospitalist service for lower extremity cellulitis and pericardial effusion. CT back done and my input requested. CT shows primarily post op change and  good decompression. There is mention of the possibility of infection, but this does not look like that. He is getting better as his cellulitis resolves. For now would simply continue present rx. PT should steadily increase his activity as tolerated. If he does not continue to improve,  could possibly need a myelogram as he cannot have an MRI secondary to a pacemaker.  LOS: 4 days  as above   Reinaldo Meeker, MD 04/13/2013, 8:33 AM

## 2013-04-13 NOTE — Progress Notes (Signed)
Physical Therapy Treatment Patient Details Name: [name] MRN: [id number] DOB: 05-Jul-1946 Today's Date: 04/13/2013 Time: 1109-1150 PT Time Calculation (min): 41 min  PT Assessment / Plan / Recommendation Comments on Treatment Session  Pt continues to c/o overall back and LE pain reporting 10/10 back pain with ambulation.  However pt able to increase ambulation distance with max encouragement.  Will continue to follow and pt agreeable to CIR.    Follow Up Recommendations  CIR     Equipment Recommendations  Rolling walker with 5" wheels    Recommendations for Other Services Rehab consult  Frequency Min 3X/week   Plan Discharge plan remains appropriate;Frequency remains appropriate    Precautions / Restrictions Precautions Precautions: Back;Fall Precaution Comments: reviewed back precautions with bed mobility. Restrictions Weight Bearing Restrictions: No   Pertinent Vitals/Pain 10/10 back pain and left upper thigh pain    Mobility  Bed Mobility Bed Mobility: Rolling Left;Left Sidelying to Sit Rolling Left: 1: +2 Total assist;With rail Rolling Left: Patient Percentage: 30% Left Sidelying to Sit: 1: +2 Total assist Left Sidelying to Sit: Patient Percentage: 30% Details for Bed Mobility Assistance: +2 (A) to elevate LE into bed and slowly descend trunk.  Max cues for technique to prevent twisting. Transfers Transfers: Sit to Stand;Stand to Sit;Stand Pivot Transfers Sit to Stand: 1: +2 Total assist;From chair/3-in-1 Sit to Stand: Patient Percentage: 50% Stand to Sit: 3: Mod assist;To bed Stand Pivot Transfers: 1: +2 Total assist Stand Pivot Transfers: Patient Percentage: 70% Details for Transfer Assistance: +2 (A) to initiate transfer from lower surface with max cues for hand placement and LE placment.  Pt having difficulty with bilateral knee flexion for transfer.   Ambulation/Gait Ambulation/Gait Assistance: 1: +2 Total assist Ambulation/Gait: Patient Percentage:  70% Ambulation Distance (Feet): 15 Feet (5' seated rest break & 10') Assistive device: Rolling walker Gait Pattern: Step-to pattern;Shuffle;Antalgic Gait velocity: decreased Stairs: No Wheelchair Mobility Wheelchair Mobility: No    Exercises     PT Diagnosis:    PT Problem List:   PT Treatment Interventions:     PT Goals Acute Rehab PT Goals PT Goal Formulation: With patient Time For Goal Achievement: 04/26/13 Potential to Achieve Goals: Good Pt will go Supine/Side to Sit: with min assist;with HOB 0 degrees PT Goal: Supine/Side to Sit - Progress: Progressing toward goal Pt will go Sit to Supine/Side: with min assist;with HOB 0 degrees PT Goal: Sit to Supine/Side - Progress: Progressing toward goal Pt will go Sit to Stand: with min assist;with upper extremity assist PT Goal: Sit to Stand - Progress: Progressing toward goal Pt will go Stand to Sit: with min assist;with upper extremity assist PT Goal: Stand to Sit - Progress: Progressing toward goal Pt will Transfer Bed to Chair/Chair to Bed: with min assist PT Transfer Goal: Bed to Chair/Chair to Bed - Progress: Progressing toward goal Pt will Ambulate: 16 - 50 feet;with min assist;with least restrictive assistive device PT Goal: Ambulate - Progress: Progressing toward goal  Visit Information  Last PT Received On: 04/13/13 Assistance Needed: +2    Subjective Data  Subjective: Pt reports decrease overall pain this PM with 3/10 being the highest in bilateral thighs. Patient Stated Goal: To walk   Cognition  Cognition Arousal/Alertness: Awake/alert Behavior During Therapy: WFL for tasks assessed/performed Overall Cognitive Status: Within Functional Limits for tasks assessed    Balance     End of Session PT - End of Session Equipment Utilized During Treatment: Gait belt Activity Tolerance: Patient tolerated treatment well Patient  left: with call bell/phone within reach;in chair Nurse Communication: Mobility status    GP     [name] 04/13/2013, 2:46 PM Jake Shark, PT DPT [phone]

## 2013-04-13 NOTE — Progress Notes (Signed)
[facility]  Subjective:  Awake, alert, working with PT   Objective: Vital signs in last 24 hours: Blood pressure 159/80, pulse 83, temperature 98.3 F (36.8 C), temperature source Oral, resp. rate 22, height 6\' 1"  (1.854 m), weight 95.6 kg (210 lb 12.2 oz), SpO2 98.00%.    PHYSICAL EXAM General--as above, says R LE not as sore as before Chest--AICD L subclavian, decreased BS in bases Heart--no rub Abd--nontender, allograft in RLQ Extr--clotted AV fistula right forearm; venous stasis changes, 2+ edema, erythema right lower extremity; venous stasis changes   Date  Weight 26 May  88.1 kg 28 May  94.8  30 May  95.6  I/O yesterday 610/1800 (?) on furosemide 80 IV Q 12 hr  Lab Results:   Recent Labs Lab 04/11/13 0430 04/12/13 0355 04/13/13 0543  NA 135 136 135  K 4.3 4.6 4.4  CL 103 101 100  CO2 19 21 21   BUN 58* 60* 62*  CREATININE 2.38* 2.77* 2.93*  GLUCOSE 208* 119* 165*  CALCIUM 7.9* 8.6 8.8  PHOS  --  5.2* 5.6*     Recent Labs  04/12/13 0355 04/13/13 0543  WBC 9.8 8.0  HGB 8.2* 8.2*  HCT 26.4* 26.2*  PLT 85* 96*     I have reviewed the patient's current medications. Scheduled: . ipratropium  0.5 mg Nebulization BID   And  . albuterol  2.5 mg Nebulization BID  . antiseptic oral rinse  15 mL Mouth Rinse BID  . aspirin EC  325 mg Oral Daily  . atorvastatin  40 mg Oral q1800  . carvedilol  12.5 mg Oral BID WC  . colchicine  0.6 mg Oral Daily  . darbepoetin (ARANESP) injection - NON-DIALYSIS  100 mcg Subcutaneous Q Thu-1800  . enoxaparin (LOVENOX) injection  40 mg Subcutaneous Q24H  . famotidine  20 mg Oral Daily  . ferumoxytol  510 mg Intravenous Q72H  . furosemide  80 mg Intravenous Q12H  . insulin aspart  0-5 Units Subcutaneous QHS  . insulin aspart  0-9 Units Subcutaneous TID WC  . insulin detemir  10 Units Subcutaneous Daily  . mycophenolate  500 mg Oral BID  . omega-3 acid ethyl esters  1 g Oral Daily  .  piperacillin-tazobactam (ZOSYN)  IV  3.375 g Intravenous Q8H  . predniSONE  5 mg Oral BID  . pyridOXINE  200 mg Oral Daily  . sodium chloride  3 mL Intravenous Q12H  . vancomycin  750 mg Intravenous Q24H  . vitamin B-12  250 mcg Oral Daily  . vitamin C  500 mg Oral Daily    Assessment/Plan: 1. 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 Edema, weight gain, and evidence of volume expansion--Increase furosemide 100 IV Q6 hr. I don't think he needs extra IV fluids at this time. Cr up again today.  Weight continues to climb 3. Pericardial effusion--on colchicine. I think diuresis would help to lessen pericardial effusion 4. 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 Coronary artery disease--see above cardiac cath results. Per cardiology (followed by Dr. Dulce Sellar in [place]) 6. Cellulitis lower extremities--continue antibiotics. Diuretics will help here as well (in my opinion) 7. S./P. recent back surgery (Dr. Gerlene Fee)-- Dr. Gerlene Fee has seen and left recs 8. Anemia--Fe/TIBC < 10%, ferritin 375. IV Fe has been given and aranesp has been started 9. DM-2/hypoglycemia-per primary doctor's 10. High BP--BP not elevated now       LOS: 4 days  [name] F 04/13/2013,11:37 AM   .[email or web address]

## 2013-04-13 NOTE — Progress Notes (Signed)
Occupational Therapy Treatment Patient Details Name: [name] MRN: [id number] DOB: 1946-05-05 Today's Date: 04/13/2013 Time: 4098-1191 OT Time Calculation (min): 41 min  OT Assessment / Plan / Recommendation Comments on Treatment Session Pt demonstrating increased activity tolerance today.  Continue to recommend CIR to furher progress rehab.    Follow Up Recommendations  CIR    Barriers to Discharge       Equipment Recommendations  None recommended by OT    Recommendations for Other Services Rehab consult  Frequency Min 2X/week   Plan Discharge plan remains appropriate    Precautions / Restrictions Precautions Precautions: Back;Fall Precaution Comments: reviewed back precautions with bed mobility. Restrictions Weight Bearing Restrictions: No   Pertinent Vitals/Pain See vitals    ADL  Upper Body Dressing: Performed;Set up Where Assessed - Upper Body Dressing: Unsupported sitting Toilet Transfer: +2 Total assistance Toilet Transfer: Patient Percentage: 50% Toilet Transfer Method:  (ambulating) Toilet Transfer Equipment:  (bed, chair) Equipment Used: Gait belt;Rolling walker Transfers/Ambulation Related to ADLs: Pt ambulated with +2 total pt = 70% ~10 ft in room.  ADL Comments: Pt requiring increased time for tasks due to c/o pain in left hip/thigh.  Pt first transferred bed<>chair and after seated rest break (~3 min) pt agreeable to attmempting further ambulation.  Educated pt on importance of increasing activity tolerance each day and not just tranferring bed<>chair only.    OT Diagnosis:    OT Problem List:   OT Treatment Interventions:     OT Goals ADL Goals Pt Will Transfer to Toilet: with supervision;Ambulation;Comfort height toilet;Grab bars;Maintaining back safety precautions ADL Goal: Toilet Transfer - Progress: Progressing toward goals Miscellaneous OT Goals Miscellaneous OT Goal #1: Pt will perform bed mobility modified independently in prep for ADL. OT  Goal: Miscellaneous Goal #1 - Progress: Progressing toward goals  Visit Information  Last OT Received On: 04/13/13 Assistance Needed: +2 PT/OT Co-Evaluation/Treatment: Yes    Subjective Data      Prior Functioning       Cognition  Cognition Arousal/Alertness: Awake/alert Behavior During Therapy: WFL for tasks assessed/performed Overall Cognitive Status: Within Functional Limits for tasks assessed    Mobility  Bed Mobility Bed Mobility: Rolling Left;Left Sidelying to Sit Rolling Left: 1: +2 Total assist;With rail Rolling Left: Patient Percentage: 30% Left Sidelying to Sit: 1: +2 Total assist Left Sidelying to Sit: Patient Percentage: 30% Sitting - Scoot to Edge of Bed: 3: Mod assist Sit to Sidelying Right: 1: +2 Total assist;HOB flat Sit to Sidelying Right: Patient Percentage: 40% Details for Bed Mobility Assistance: +2 (A) to elevate LE into bed and slowly descend trunk.  Max cues for technique to prevent twisting. Transfers Transfers: Sit to Stand;Stand to Sit Sit to Stand: 1: +2 Total assist;From bed;From chair/3-in-1;With upper extremity assist;With armrests Sit to Stand: Patient Percentage: 50% Stand to Sit: 3: Mod assist;To bed Details for Transfer Assistance: +2 assist for power up and to transition UEs onto RW.     Exercises      Balance     End of Session OT - End of Session Equipment Utilized During Treatment: Gait belt Activity Tolerance: Patient tolerated treatment well Patient left: in chair;with call bell/phone within reach Nurse Communication: Mobility status;Patient requests pain meds  GO   04/13/2013 Cipriano Mile OTR/L Pager [phone] Office [phone]   Cipriano Mile 04/13/2013, 2:47 PM

## 2013-04-14 DIAGNOSIS — J9600 Acute respiratory failure, unspecified whether with hypoxia or hypercapnia: Secondary | ICD-10-CM

## 2013-04-14 LAB — GLUCOSE, CAPILLARY: Glucose-Capillary: 83 mg/dL (ref 70–99)

## 2013-04-14 LAB — RENAL FUNCTION PANEL
CO2: 23 mEq/L (ref 19–32)
Calcium: 8.7 mg/dL (ref 8.4–10.5)
Creatinine, Ser: 3 mg/dL — ABNORMAL HIGH (ref 0.50–1.35)
GFR calc non Af Amer: 21 mL/min — ABNORMAL LOW (ref >90)
Glucose, Bld: 142 mg/dL — ABNORMAL HIGH (ref 70–99)

## 2013-04-14 LAB — CBC
HCT: 26.5 % — ABNORMAL LOW (ref 39.0–52.0)
MCHC: 31.7 g/dL (ref 30.0–36.0)
RDW: 16.4 % — ABNORMAL HIGH (ref 11.5–15.5)

## 2013-04-14 NOTE — Progress Notes (Signed)
Subjective:  Currently having a lot of diarrhea as well as nausea. He has lost significant weight overnight possibly due to diarrhea.some nausea. Is not currently having shortness of breath. Blood pressure has been stable.  Objective:  Vital Signs in the last 24 hours: BP 166/93  Pulse 85  Temp(Src) 98.4 F (36.9 C) (Oral)  Resp 19  Ht 6\' 1"  (1.854 m)  Wt 87.7 kg (193 lb 5.5 oz)  BMI 25.51 kg/m2  SpO2 100%  Physical Exam: Chronic ill appearing white male currently in no acute distress Lungs:  Clear Cardiac:  Regular rhythm, normal S1 and S2, no S3, 1-2/6 systolic murmur Extremities:  No edema present  Intake/Output from previous day: 05/30 0701 - 05/31 0700 In: 290 [P.O.:240; IV Piggyback:50] Out: 2851 [Urine:2850; Stool:1] Weight Filed Weights   04/12/13 0017 04/13/13 0418 04/14/13 0900  Weight: 95.6 kg (210 lb 12.2 oz) 95.6 kg (210 lb 12.2 oz) 87.7 kg (193 lb 5.5 oz)    Lab Results: Basic Metabolic Panel:  Recent Labs  16/10/96 0543 04/14/13 0435  NA 135 138  K 4.4 3.7  CL 100 100  CO2 21 23  GLUCOSE 165* 142*  BUN 62* 61*  CREATININE 2.93* 3.00*    CBC:  Recent Labs  04/13/13 0543 04/14/13 0435  WBC 8.0 5.5  HGB 8.2* 8.4*  HCT 26.2* 26.5*  MCV 93.2 92.0  PLT 96* 114*    BNP    Component Value Date/Time   PROBNP 21940.0* 04/09/2013 1856   Telemetry:   Assessment/Plan:  1. Large pericardial effusion without definite Not 2. Acute on chronic systolic congestive heart failure 3. Biventricular ICD placement 4. Chronic kidney disease with previous transplant  5. Acute diarrhea  Recommendations:  He is have an echocardiogram next week. I think he would be fine to stop his colchicine.  Darden Palmer  MD [facility]  04/14/2013, 11:46 AM

## 2013-04-14 NOTE — Progress Notes (Signed)
[name]  Subjective:  Awake, alert, has diarrhea (on colchicine)   Objective: Vital signs in last 24 hours: Blood pressure 166/93, pulse 85, temperature 98.4 F (36.9 C), temperature source Oral, resp. rate 19, height 6\' 1"  (1.854 m), weight 95.6 kg (210 lb 12.2 oz), SpO2 100.00%.    PHYSICAL EXAM General--as above Chest--AICD L subclavian, decreased BS in bases  Heart--no rub  Abd--nontender, allograft in RLQ  Extr--clotted AV fistula right forearm; bilateral venous stasis changes and  2+ edema, erythema right lower extremity;  Date   Weight  26 May   88.1 kg  28 May   94.8  30 May   95.6   31 May  87.7   I/O yesterday 290/2851 (now on furosemide 100 QID)  Lab Results:   Recent Labs Lab 04/12/13 0355 04/13/13 0543 04/14/13 0435  NA 136 135 138  K 4.6 4.4 3.7  CL 101 100 100  CO2 21 21 23   BUN 60* 62* 61*  CREATININE 2.77* 2.93* 3.00*  GLUCOSE 119* 165* 142*  CALCIUM 8.6 8.8 8.7  PHOS 5.2* 5.6* 5.2*     Recent Labs  04/13/13 0543 04/14/13 0435  WBC 8.0 5.5  HGB 8.2* 8.4*  HCT 26.2* 26.5*  PLT 96* 114*     I have reviewed the patient's current medications. Scheduled: . antiseptic oral rinse  15 mL Mouth Rinse BID  . aspirin EC  325 mg Oral Daily  . atorvastatin  40 mg Oral q1800  . carvedilol  12.5 mg Oral BID WC  . colchicine  0.6 mg Oral Daily  . darbepoetin (ARANESP) injection - NON-DIALYSIS  100 mcg Subcutaneous Q Thu-1800  . enoxaparin (LOVENOX) injection  40 mg Subcutaneous Q24H  . famotidine  20 mg Oral Daily  . ferumoxytol  510 mg Intravenous Q72H  . furosemide  100 mg Intravenous Q6H  . insulin aspart  0-5 Units Subcutaneous QHS  . insulin aspart  0-9 Units Subcutaneous TID WC  . insulin detemir  10 Units Subcutaneous Daily  . mycophenolate  500 mg Oral BID  . omega-3 acid ethyl esters  1 g Oral Daily  . piperacillin-tazobactam (ZOSYN)  IV  3.375 g Intravenous Q8H  . predniSONE  5 mg Oral BID  . pyridOXINE  200  mg Oral Daily  . sodium chloride  3 mL Intravenous Q12H  . vitamin B-12  250 mcg Oral Daily  . vitamin C  500 mg Oral Daily    Assessment/Plan:  1. Renal transplant (done in 1974)/CKD-4-- now with worsening renal function.Renal ultrasound--no hydro.  Cr 3 today 2. Edema, weight gain, and evidence of volume expansion--better diuresis with furosemide 100 IV Q6 hr. I don't think he needs extra IV fluids at this time. Cr up again today. Weight coming down 3. Pericardial effusion--on colchicine. Diarrhea may be from colchicine--it's useful in pericarditis.  He has effusion without pericarditis.  I'd suggest d/c colchicine  4. 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 Coronary artery disease--see above cardiac cath results. Per cardiology (followed by Dr. Dulce Sellar in [place]) 6. Cellulitis lower extremities--continue antibiotics. Diuretics will help here as well (in my opinion) 7. S./P. recent back surgery (Dr. Gerlene Fee)-- Dr. Gerlene Fee has seen and left recs 8. Anemia--Fe/TIBC < 10%, ferritin 375. IV Fe has been given and aranesp has been started. Hgb today 8.4 9. DM-2/hypoglycemia-per primary doctors 10. High BP--BP not elevated now     LOS: 5 days   Hadley Detloff F 04/14/2013,9:20 AM   .  labalb

## 2013-04-14 NOTE — Progress Notes (Signed)
Filed Vitals:   04/13/13 1735 04/13/13 2008 04/14/13 0537 04/14/13 0900  BP: 160/80 161/78 166/93   Pulse: 87 88 85   Temp:  98.6 F (37 C) 98.4 F (36.9 C)   TempSrc: Oral Oral Oral   Resp:  20 19   Height:      Weight:    87.7 kg (193 lb 5.5 oz)  SpO2: 100% 100% 100%     CBC  Recent Labs  04/13/13 0543 04/14/13 0435  WBC 8.0 5.5  HGB 8.2* 8.4*  HCT 26.2* 26.5*  PLT 96* 114*   BMET  Recent Labs  04/13/13 0543 04/14/13 0435  NA 135 138  K 4.4 3.7  CL 100 100  CO2 21 23  GLUCOSE 165* 142*  BUN 62* 61*  CREATININE 2.93* 3.00*  CALCIUM 8.8 8.7     Patient sitting once up in bedside chair. Notes less discomfort in his left lower extremity. Having some difficulties with diarrhea which he attributes to medication being given to him for pericardial effusion. Activity is quite limited, essentially bed to chair at this point. I've encouraged him to progress as feasible.  Plan: Dr. Gerlene Fee to followup on Monday a.m.  Hewitt Shorts, MD 04/14/2013, 11:06 AM

## 2013-04-14 NOTE — Progress Notes (Signed)
[facility] Progress Note    [name] RUE:[id number] DOB: 1946/08/30 DOA: 04/09/2013 PCP: [name], MD  Brief narrative: 67 year old male patient on chronic immunosuppression after renal transplant remotely. Had lumbar decompression surgery 2 weeks prior to admission. Patient's wife brought him to the ER because he was confused and short of breath. In the ER patient was febrile and he was noted to have right lower extremity erythematous changes that appear to be consistent with cellulitis. Patient had a prior history of left septic knee and had been followed by ID at that time. Patient was also hypoglycemic with a blood sugar of 50. 24 hours prior to presenting to the ER patient had been doing well since his surgery. He had been having right knee pain but denied any back pain at the surgical site and had not had any drainage from the surgical site. Chest x-ray showed possible central vascular congestion - the patient had mildly elevated troponin. He also had a paced rhythm with a bundle branch block. He also appeared to be in acute renal failure.  Assessment/Plan:    SIRS (systemic inflammatory response syndrome) -likely source RLE cellulitis -CT lumbar spine ? possible abscess (epidural air fluid collection) but no gen inflammatory changes - not felt to be infectious changes per Neurosurgery -cont empiric anbx's and supportive care; follow all cx's    Cellulitis of right lower extremity -CT knee without osteo but MRI preferred imaging and can't obtain due to pacemaker -tiny joint effusion and SQ edema but no focal fluid collection -cont empiric anbx's -clinically improving    Renal failure (ARF), acute on chronic kidney disease stage 3 -nephrology clarified with pt's primary nephrologist that baseline Scr is 1.8 -Volume overloaded - PO Lasix started 5/28 and changed to IV dosing 5/29 -Scr rising but needs add'l diuresis so Renal increased Lasix dose to 100 mg TID 5/30 - expect  with diuresis Scr should decrease -appreciate Renal assistance    Demand ischemia/CAD (coronary artery disease) -subsequent enzymes have been normal and no CP -cont Coreg and ASA    Systolic Heart Failure/EF 30-35%/Grade 2 Diastolic Heart Failure -baseline EF 25% in March 2013 after PEA arrest requiring PPM/ICD -cont coreg-doubt can use ACE with CKD and h/o renal transplant -Cards following -diuresing with IV Lasix     Large Pericardial Effusion -cards managing - seen on ECHO this admit -started on Colchicine per Cards, but was giving him diarrhea, hence it was stopped.  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 -IV iron this admit -hgb stable -was on oral B12 pre admit- check level here- may not be absorbing thru gut and may need injectable replacement    Positive D dimer -VQ scan with VERY low probability for PE     History of renal transplant -cont Prednisone and Cellcept for now -Nephrology following     Acute respiratory failure with hypoxia   -improved - oxygen weaned to RA     Exopthalmus -TSH 0.795    PVD -had purple discolored areas to left great toe and bases of toes left foot and foot cool to touch 5/27 - 5/28 edema and pallor/cooleness resolved -doppler pulse present -pt endorsed vascular studies 4 months ago at [facility] -follow    HTN (hypertension) -BP controlled  -cont home meds    Diabetes mellitus type 2, controlled/hypoglycemia -Levemir at home on hold -CBG much improved at this  time    Hx of decompressive lumbar laminectomy (L-3) May12, 2014 -?of epidural abscess as per CT scan of spine -pain better after adjustments in narcs 5/28 -formally consulted Dr.Kritzer - he has reviewed the CT lumbar spine and does not feel changes are c/w infection -PT/OT recommending CIR when medically stable  History of atrial fib -occurred March 2013- pt refused anti  coagulation  RUE edema -follow- had IV in same arm and if edema persists or worsens despite diuiresis may need venous duplex to r/o DVT -currently decreasing with elevation and diuresis  DVT prophylaxis:  Lovenox   Code Status: Full Family Communication: Patient Disposition Plan: transfer to tele bed  Isolation: None  Consultants: Dr. Gerlene Fee with Neurosurgery Cardiology Nephrology  Procedures: 2-D echocardiogram  - Left ventricle: The cavity size was moderately dilated. There was mild focal basal hypertrophy of the septum. Systolic function was moderately to severely reduced. The estimated ejection fraction was in the range of 30% to 35%. There is akinesis of the inferior posterior myocardium. There is hypokinesis of the lateral myocardium. Features are consistent with a pseudonormal left ventricular filling pattern, with concomitant abnormal relaxation and increased filling pressure (grade 2 diastolic dysfunction). - Aortic valve: Trivial regurgitation. - Mitral valve: Moderate regurgitation. - Left atrium: The atrium was moderately dilated. - Right ventricle: The cavity size was mildly dilated. Systolic function was severely reduced. - Right atrium: The atrium was mildly dilated. - Pulmonary arteries: PA peak pressure: 39mm Hg (S). - Pericardium, extracardiac: A large pericardial effusion was identified. Impressions: - Inferoposterior akinesis and lateral hypokinesis with moderate to severe reduction in LV function; MR difficult to quantitate due to eccentric nature of jet but probably moderate; RAE/RVE with severely reduced RV function; large pericardial effusion with mild RA collapse; no RV diastolic collapse; IVC mildly dilated with blunted response to inspiration.  Lower extremity venous duplex  negative for DVT  Antibiotics: Zosyn 5/26 >>> Vancomycin 5/26 >>> 5/30  HPI/Subjective: Patient alert and now reports significant improvement in low back pain and  hip pain. No SOB or CP.   Objective: Blood pressure 155/77, pulse 81, temperature 98.2 F (36.8 C), temperature source Oral, resp. rate 18, height 6\' 1"  (1.854 m), weight 87.7 kg (193 lb 5.5 oz), SpO2 100.00%.  Intake/Output Summary (Last 24 hours) at 04/14/13 1653 Last data filed at 04/14/13 0604  Gross per 24 hour  Intake      0 ml  Output    800 ml  Net   -800 ml    Exam: General: No acute respiratory distress Lungs: Clear to auscultation bilaterally without wheezes or crackles, RA Cardiovascular: Regular rate and ventricular paced rhythm without murmur gallop or rub normal S1 and S2, no JVD but does have 2+ pitting edema of right foot Abdomen: Nontender, nondistended, soft, bowel sounds positive, no rebound, no ascites, no appreciable mass Musculoskeletal: No significant cyanosis, clubbing of bilateral lower extremities Neurological: Alert and oriented x 3, moves all extremities x 4 without focal neurological deficits, CN 2-12 intact Integumentary: Patient has diffuse papular scaly lesions throughout entire body that he reports has been present for greater than 20 years; has decreased erythematous skin changes on the right lower extremity extending from the foot to below the knee - resolved erythematous changes on the left lower extremity mainly involving the calf  Scheduled Meds: Scheduled Meds: . antiseptic oral rinse  15 mL Mouth Rinse BID  . aspirin EC  325 mg Oral Daily  . atorvastatin  40 mg Oral  q1800  . carvedilol  12.5 mg Oral BID WC  . darbepoetin (ARANESP) injection - NON-DIALYSIS  100 mcg Subcutaneous Q Thu-1800  . enoxaparin (LOVENOX) injection  40 mg Subcutaneous Q24H  . famotidine  20 mg Oral Daily  . ferumoxytol  510 mg Intravenous Q72H  . furosemide  100 mg Intravenous Q6H  . insulin aspart  0-5 Units Subcutaneous QHS  . insulin aspart  0-9 Units Subcutaneous TID WC  . insulin detemir  10 Units Subcutaneous Daily  . mycophenolate  500 mg Oral BID  . omega-3  acid ethyl esters  1 g Oral Daily  . piperacillin-tazobactam (ZOSYN)  IV  3.375 g Intravenous Q8H  . predniSONE  5 mg Oral BID  . pyridOXINE  200 mg Oral Daily  . sodium chloride  3 mL Intravenous Q12H  . vitamin B-12  250 mcg Oral Daily  . vitamin C  500 mg Oral Daily   Data Reviewed: Basic Metabolic Panel:  Recent Labs Lab 04/10/13 0200 04/11/13 0430 04/12/13 0355 04/13/13 0543 04/14/13 0435  NA 139 135 136 135 138  K 5.0 4.3 4.6 4.4 3.7  CL 104 103 101 100 100  CO2 21 19 21 21 23   GLUCOSE 64* 208* 119* 165* 142*  BUN 62* 58* 60* 62* 61*  CREATININE 2.45* 2.38* 2.77* 2.93* 3.00*  CALCIUM 8.2* 7.9* 8.6 8.8 8.7  MG  --   --   --  1.8 1.7  PHOS  --   --  5.2* 5.6* 5.2*   Liver Function Tests:  Recent Labs Lab 04/09/13 1855 04/10/13 0200 04/12/13 0355 04/13/13 0543 04/14/13 0435  AST 29 28  --  24  --   ALT 20 16  --  22  --   ALKPHOS 66 57  --  83  --   BILITOT 0.4 0.3  --  0.2*  --   PROT 5.3* 4.3*  --  4.9*  --   ALBUMIN 2.9* 2.3* 1.9* 1.9* 2.1*   CBC:  Recent Labs Lab 04/09/13 1855 04/10/13 0845 04/11/13 0430 04/12/13 0355 04/13/13 0543 04/14/13 0435  WBC 17.8* 15.1* 10.4 9.8 8.0 5.5  NEUTROABS 16.0*  --   --   --   --   --   HGB 10.4* 8.4* 8.5* 8.2* 8.2* 8.4*  HCT 33.1* 26.9* 27.8* 26.4* 26.2* 26.5*  MCV 94.6 94.7 94.9 95.7 93.2 92.0  PLT 157 115* 106* 85* 96* 114*   Cardiac Enzymes:  Recent Labs Lab 04/10/13 0200 04/10/13 0958  TROPONINI <0.30 <0.30   BNP (last 3 results)  Recent Labs  04/09/13 1856  PROBNP 21940.0*   CBG:  Recent Labs Lab 04/13/13 2122 04/14/13 0603 04/14/13 0829 04/14/13 1103 04/14/13 1559  GLUCAP 139* 134* 114* 113* 119*    Recent Results (from the past 240 hour(s))  CULTURE, BLOOD (ROUTINE X 2)     Status: None   Collection Time    04/09/13  6:45 PM      Result Value Range Status   Specimen Description BLOOD RIGHT ARM   Final   Special Requests BOTTLES DRAWN AEROBIC ONLY 6CC   Final   Culture   Setup Time 04/10/2013 01:04   Final   Culture     Final   Value:        BLOOD CULTURE RECEIVED NO GROWTH TO DATE CULTURE WILL BE HELD FOR 5 DAYS BEFORE ISSUING A FINAL NEGATIVE REPORT   Report Status PENDING   Incomplete  CULTURE, BLOOD (ROUTINE X  2)     Status: None   Collection Time    04/09/13  6:50 PM      Result Value Range Status   Specimen Description BLOOD RIGHT WRIST   Final   Special Requests BOTTLES DRAWN AEROBIC ONLY 2CC   Final   Culture  Setup Time 04/10/2013 01:04   Final   Culture     Final   Value:        BLOOD CULTURE RECEIVED NO GROWTH TO DATE CULTURE WILL BE HELD FOR 5 DAYS BEFORE ISSUING A FINAL NEGATIVE REPORT   Report Status PENDING   Incomplete  URINE CULTURE     Status: None   Collection Time    04/09/13  7:44 PM      Result Value Range Status   Specimen Description URINE, CATHETERIZED   Final   Special Requests NONE   Final   Culture  Setup Time 04/09/2013 20:44   Final   Colony Count NO GROWTH   Final   Culture NO GROWTH   Final   Report Status 04/10/2013 FINAL   Final  MRSA PCR SCREENING     Status: None   Collection Time    04/10/13 12:04 AM      Result Value Range Status   MRSA by PCR NEGATIVE  NEGATIVE Final   Comment:            The GeneXpert MRSA Assay (FDA     approved for NASAL specimens     only), is one component of a     comprehensive MRSA colonization     surveillance program. It is not     intended to diagnose MRSA     infection nor to guide or     monitor treatment for     MRSA infections.    Bayard Hugger MD [phone]  On-Call/Text Page:      [email or web address]      password [email or web address]  If 7PM-7AM, please contact night-coverage [email or web address] Password [email or web address] 04/14/2013, 4:53 PM   LOS: 5 days

## 2013-04-15 LAB — CBC WITH DIFFERENTIAL/PLATELET
Hemoglobin: 8.9 g/dL — ABNORMAL LOW (ref 13.0–17.0)
Lymphs Abs: 0.9 10*3/uL (ref 0.7–4.0)
Monocytes Relative: 6 % (ref 3–12)
Neutro Abs: 5.9 10*3/uL (ref 1.7–7.7)
Neutrophils Relative %: 81 % — ABNORMAL HIGH (ref 43–77)
RBC: 3.02 MIL/uL — ABNORMAL LOW (ref 4.22–5.81)
WBC: 7.2 10*3/uL (ref 4.0–10.5)

## 2013-04-15 LAB — PROTEIN / CREATININE RATIO, URINE
Protein Creatinine Ratio: 8.1 — ABNORMAL HIGH (ref 0.00–0.15)
Total Protein, Urine: 128.2 mg/dL

## 2013-04-15 LAB — GLUCOSE, CAPILLARY: Glucose-Capillary: 92 mg/dL (ref 70–99)

## 2013-04-15 MED ORDER — POTASSIUM CHLORIDE CRYS ER 20 MEQ PO TBCR
40.0000 meq | EXTENDED_RELEASE_TABLET | Freq: Two times a day (BID) | ORAL | Status: AC
  Administered 2013-04-15 (×2): 40 meq via ORAL
  Filled 2013-04-15 (×2): qty 2

## 2013-04-15 MED ORDER — FUROSEMIDE 80 MG PO TABS
80.0000 mg | ORAL_TABLET | Freq: Two times a day (BID) | ORAL | Status: DC
  Administered 2013-04-15 – 2013-04-17 (×5): 80 mg via ORAL
  Filled 2013-04-15 (×7): qty 1

## 2013-04-15 MED ORDER — HYDROCERIN EX CREA
TOPICAL_CREAM | Freq: Two times a day (BID) | CUTANEOUS | Status: DC
  Administered 2013-04-15 – 2013-04-19 (×9): via TOPICAL
  Administered 2013-04-19: 1 via TOPICAL
  Administered 2013-04-20 – 2013-04-23 (×7): via TOPICAL
  Filled 2013-04-15 (×2): qty 113

## 2013-04-15 NOTE — Progress Notes (Signed)
[facility]  Subjective:  Awake, alert, no more diarrhea following d/c of colchicine, lots of urine out yesterday and today on IV furosemide   Objective: Vital signs in last 24 hours: Blood pressure 165/95, pulse 82, temperature 98.1 F (36.7 C), temperature source Oral, resp. rate 18, height 6\' 1"  (1.854 m), weight 84.188 kg (185 lb 9.6 oz), SpO2 99.00%.    PHYSICAL EXAM General--as above  Chest--AICD L subclavian, decreased BS in bases  Heart--no rub  Abd--nontender, allograft in RLQ  Extr--clotted AV fistula right forearm; bilateral venous stasis changes and 1+ edema, erythema right lower extremity;  Date  Weight  26 May  88.1 kg  28 May  94.8  30 May  95.6  31 May  87.7   1 Jun  84.2  I/O yesterday 880/3050 yesterday  Lab Results:   Recent Labs Lab 04/12/13 0355 04/13/13 0543 04/14/13 0435  NA 136 135 138  K 4.6 4.4 3.7  CL 101 100 100  CO2 21 21 23   BUN 60* 62* 61*  CREATININE 2.77* 2.93* 3.00*  GLUCOSE 119* 165* 142*  CALCIUM 8.6 8.8 8.7  PHOS 5.2* 5.6* 5.2*     Recent Labs  04/13/13 0543 04/14/13 0435  WBC 8.0 5.5  HGB 8.2* 8.4*  HCT 26.2* 26.5*  PLT 96* 114*     I have reviewed the patient's current medications. Scheduled: . antiseptic oral rinse  15 mL Mouth Rinse BID  . aspirin EC  325 mg Oral Daily  . atorvastatin  40 mg Oral q1800  . carvedilol  12.5 mg Oral BID WC  . darbepoetin (ARANESP) injection - NON-DIALYSIS  100 mcg Subcutaneous Q Thu-1800  . enoxaparin (LOVENOX) injection  40 mg Subcutaneous Q24H  . famotidine  20 mg Oral Daily  . ferumoxytol  510 mg Intravenous Q72H  . furosemide  80 mg Oral BID  . hydrocerin   Topical BID  . insulin aspart  0-5 Units Subcutaneous QHS  . insulin aspart  0-9 Units Subcutaneous TID WC  . insulin detemir  10 Units Subcutaneous Daily  . mycophenolate  500 mg Oral BID  . omega-3 acid ethyl esters  1 g Oral Daily  . piperacillin-tazobactam (ZOSYN)  IV  3.375 g Intravenous Q8H   . predniSONE  5 mg Oral BID  . pyridOXINE  200 mg Oral Daily  . sodium chloride  3 mL Intravenous Q12H  . vitamin B-12  250 mcg Oral Daily  . vitamin C  500 mg Oral Daily    Assessment/Plan: 1. Renal transplant (done in 1974)/CKD-4-- now with worsening renal function.Renal ultrasound--no hydro. Cr 3 yesterday.  Check lab in AM.  Check urine pro/Cr 2. Edema, weight gain, and evidence of volume expansion--good diuresis with furosemide 100 IV Q6 hr.Will decrease to 80 po BID.  Weight coming down 3. Pericardial effusion--off colchicine. No further diarrhea 4. S./P. V. fib arrest (Apr 2013)with AICD placed then --per cardiology 5. Coronary artery disease-- cardiac cath done Apr 2013 in [place]. Per cardiology (followed by Dr. Dulce Sellar in [place]) 6. Cellulitis lower extremities--continue antibiotics and Diuretics.  Less edema today-- eucerin cream for scaly skin  7. S./P. recent back surgery (Dr. Gerlene Fee)-- Dr. Gerlene Fee has seen and left recs 8. Anemia--Fe/TIBC < 10%, ferritin 375. IV Fe has been given and aranesp has been started. Hgb yesterday 8.4 9. DM-2/hypoglycemia-per primary doctors 10. High BP--BP not elevated now off meds     LOS: 6 days   [name] F 04/15/2013,9:56 AM   .labalb

## 2013-04-15 NOTE — Progress Notes (Signed)
Subjective:  Diarrhea has resolved with stopping the colchicine.  Feeling better with no shortness of breath or chest pain.  Objective:  Vital Signs in the last 24 hours: BP 165/95  Pulse 82  Temp(Src) 98.1 F (36.7 C) (Oral)  Resp 18  Ht 6\' 1"  (1.854 m)  Wt 84.188 kg (185 lb 9.6 oz)  BMI 24.49 kg/m2  SpO2 99%  Physical Exam: Chronic ill appearing white male currently in no acute distress Lungs:  Clear Cardiac:  Regular rhythm, normal S1 and S2, no S3, 1-2/6 systolic murmur Extremities:  Mild edema of the right lower extremity with mild erythema  Intake/Output from previous day: 05/31 0701 - 06/01 0700 In: 880 [P.O.:880] Out: 3050 [Urine:3050] Weight Filed Weights   04/13/13 0418 04/14/13 0900 04/15/13 0700  Weight: 95.6 kg (210 lb 12.2 oz) 87.7 kg (193 lb 5.5 oz) 84.188 kg (185 lb 9.6 oz)    Lab Results: Basic Metabolic Panel:  Recent Labs  21/30/86 0543 04/14/13 0435  NA 135 138  K 4.4 3.7  CL 100 100  CO2 21 23  GLUCOSE 165* 142*  BUN 62* 61*  CREATININE 2.93* 3.00*    CBC:  Recent Labs  04/13/13 0543 04/14/13 0435  WBC 8.0 5.5  HGB 8.2* 8.4*  HCT 26.2* 26.5*  MCV 93.2 92.0  PLT 96* 114*    BNP    Component Value Date/Time   PROBNP 21940.0* 04/09/2013 1856   Telemetry:   Assessment/Plan:  1. Large pericardial effusion without tamponade 2. Acute on chronic systolic congestive heart failure 3. Biventricular ICD placement 4. Chronic kidney disease with previous transplant  5. Diarrhea that has resolved.  Recommendations:  He is have an echocardiogram next week. He has lost significant weight and continue his current medical therapy.  Darden Palmer  MD [facility]  04/15/2013, 10:30 AM

## 2013-04-15 NOTE — Progress Notes (Signed)
[facility] Progress Note    GARREN GREENMAN AOZ:[id number] DOB: 03/13/1946 DOA: 04/09/2013 PCP: [name], MD  Brief narrative: 67 year old male patient on chronic immunosuppression after renal transplant remotely. Had lumbar decompression surgery 2 weeks prior to admission. Patient's wife brought him to the ER because he was confused and short of breath. In the ER patient was febrile and he was noted to have right lower extremity erythematous changes that appear to be consistent with cellulitis. Patient had a prior history of left septic knee and had been followed by ID at that time. Patient was also hypoglycemic with a blood sugar of 50. 24 hours prior to presenting to the ER patient had been doing well since his surgery. He had been having right knee pain but denied any back pain at the surgical site and had not had any drainage from the surgical site. Chest x-ray showed possible central vascular congestion - the patient had mildly elevated troponin. He also had a paced rhythm with a bundle branch block. He also appeared to be in acute renal failure.  Assessment/Plan:    SIRS (systemic inflammatory response syndrome) -likely source RLE cellulitis -CT lumbar spine ? possible abscess (epidural air fluid collection) but no gen inflammatory changes - not felt to be infectious changes per Neurosurgery -cont empiric anbx's and supportive care; follow all cx's    Cellulitis of right lower extremity -CT knee without osteo but MRI preferred imaging and can't obtain due to pacemaker -tiny joint effusion and SQ edema but no focal fluid collection -cont empiric anbx's -clinically improving    Renal failure (ARF), acute on chronic kidney disease stage 3 -nephrology clarified with pt's primary nephrologist that baseline Scr is 1.8 -Volume overloaded - PO Lasix started 5/28 and changed to IV dosing 5/29 -Scr rising but needs add'l diuresis so Renal increased Lasix dose to 100 mg TID 5/30 - expect  with diuresis Scr should decrease -appreciate Renal assistance    Demand ischemia/CAD (coronary artery disease) -subsequent enzymes have been normal and no CP -cont Coreg and ASA    Systolic Heart Failure/EF 30-35%/Grade 2 Diastolic Heart Failure -baseline EF 25% in March 2013 after PEA arrest requiring PPM/ICD -cont coreg-doubt can use ACE with CKD and h/o renal transplant -Cards following -diuresing with IV Lasix     Large Pericardial Effusion -cards managing - seen on ECHO this admit -started on Colchicine per Cards, but was giving him diarrhea, hence it was stopped.  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 -IV iron this admit -hgb stable -was on oral B12 pre admit- check level here- may not be absorbing thru gut and may need injectable replacement    Positive D dimer -VQ scan with VERY low probability for PE     History of renal transplant -cont Prednisone and Cellcept for now -Nephrology following     Acute respiratory failure with hypoxia   -improved - oxygen weaned to RA     Exopthalmus -TSH 0.795    PVD -had purple discolored areas to left great toe and bases of toes left foot and foot cool to touch 5/27 - 5/28 edema and pallor/cooleness resolved -doppler pulse present -pt endorsed vascular studies 4 months ago at [facility] -follow    HTN (hypertension) -BP controlled  -cont home meds    Diabetes mellitus type 2, controlled/hypoglycemia -Levemir at home on hold -CBG much improved at this  time    Hx of decompressive lumbar laminectomy (L-3) May12, 2014 -?of epidural abscess as per CT scan of spine -pain better after adjustments in narcs 5/28 -formally consulted Dr.Kritzer - he has reviewed the CT lumbar spine and does not feel changes are c/w infection -PT/OT recommending CIR when medically stable  History of atrial fib -occurred March 2013- pt refused anti  coagulation  RUE edema -follow- had IV in same arm and if edema persists or worsens despite diuiresis may need venous duplex to r/o DVT -currently decreasing with elevation and diuresis  DVT prophylaxis:  Lovenox   Code Status: Full Family Communication: Patient Disposition Plan: transfer to tele bed  Isolation: None  Consultants: Dr. Gerlene Fee with Neurosurgery Cardiology Nephrology  Procedures: 2-D echocardiogram  - Left ventricle: The cavity size was moderately dilated. There was mild focal basal hypertrophy of the septum. Systolic function was moderately to severely reduced. The estimated ejection fraction was in the range of 30% to 35%. There is akinesis of the inferior posterior myocardium. There is hypokinesis of the lateral myocardium. Features are consistent with a pseudonormal left ventricular filling pattern, with concomitant abnormal relaxation and increased filling pressure (grade 2 diastolic dysfunction). - Aortic valve: Trivial regurgitation. - Mitral valve: Moderate regurgitation. - Left atrium: The atrium was moderately dilated. - Right ventricle: The cavity size was mildly dilated. Systolic function was severely reduced. - Right atrium: The atrium was mildly dilated. - Pulmonary arteries: PA peak pressure: 39mm Hg (S). - Pericardium, extracardiac: A large pericardial effusion was identified. Impressions: - Inferoposterior akinesis and lateral hypokinesis with moderate to severe reduction in LV function; MR difficult to quantitate due to eccentric nature of jet but probably moderate; RAE/RVE with severely reduced RV function; large pericardial effusion with mild RA collapse; no RV diastolic collapse; IVC mildly dilated with blunted response to inspiration.  Lower extremity venous duplex  negative for DVT  Antibiotics: Zosyn 5/26 >>> Vancomycin 5/26 >>> 5/30  HPI/Subjective: Patient alert and now reports significant improvement in low back pain and  hip pain. No SOB or CP.   Objective: Blood pressure 175/80, pulse 112, temperature 98.4 F (36.9 C), temperature source Oral, resp. rate 18, height 6\' 1"  (1.854 m), weight 84.188 kg (185 lb 9.6 oz), SpO2 100.00%.  Intake/Output Summary (Last 24 hours) at 04/15/13 1716 Last data filed at 04/15/13 1400  Gross per 24 hour  Intake    813 ml  Output   2400 ml  Net  -1587 ml    Exam: General: No acute respiratory distress Lungs: Clear to auscultation bilaterally without wheezes or crackles, RA Cardiovascular: Regular rate and ventricular paced rhythm without murmur gallop or rub normal S1 and S2, no JVD but does have 2+ pitting edema of right foot Abdomen: Nontender, nondistended, soft, bowel sounds positive, no rebound, no ascites, no appreciable mass Musculoskeletal: No significant cyanosis, clubbing of bilateral lower extremities Neurological: Alert and oriented x 3, moves all extremities x 4 without focal neurological deficits, CN 2-12 intact Integumentary: Patient has diffuse papular scaly lesions throughout entire body that he reports has been present for greater than 20 years; has decreased erythematous skin changes on the right lower extremity extending from the foot to below the knee - resolved erythematous changes on the left lower extremity mainly involving the calf  Scheduled Meds: Scheduled Meds: . antiseptic oral rinse  15 mL Mouth Rinse BID  . aspirin EC  325 mg Oral Daily  . atorvastatin  40 mg Oral q1800  . carvedilol  12.5 mg Oral BID WC  . darbepoetin (ARANESP) injection - NON-DIALYSIS  100 mcg Subcutaneous Q Thu-1800  . enoxaparin (LOVENOX) injection  40 mg Subcutaneous Q24H  . famotidine  20 mg Oral Daily  . furosemide  80 mg Oral BID  . hydrocerin   Topical BID  . insulin aspart  0-5 Units Subcutaneous QHS  . insulin aspart  0-9 Units Subcutaneous TID WC  . insulin detemir  10 Units Subcutaneous Daily  . mycophenolate  500 mg Oral BID  . omega-3 acid ethyl esters   1 g Oral Daily  . piperacillin-tazobactam (ZOSYN)  IV  3.375 g Intravenous Q8H  . potassium chloride  40 mEq Oral BID  . predniSONE  5 mg Oral BID  . pyridOXINE  200 mg Oral Daily  . sodium chloride  3 mL Intravenous Q12H  . vitamin B-12  250 mcg Oral Daily  . vitamin C  500 mg Oral Daily   Data Reviewed: Basic Metabolic Panel:  Recent Labs Lab 04/10/13 0200 04/11/13 0430 04/12/13 0355 04/13/13 0543 04/14/13 0435  NA 139 135 136 135 138  K 5.0 4.3 4.6 4.4 3.7  CL 104 103 101 100 100  CO2 21 19 21 21 23   GLUCOSE 64* 208* 119* 165* 142*  BUN 62* 58* 60* 62* 61*  CREATININE 2.45* 2.38* 2.77* 2.93* 3.00*  CALCIUM 8.2* 7.9* 8.6 8.8 8.7  MG  --   --   --  1.8 1.7  PHOS  --   --  5.2* 5.6* 5.2*   Liver Function Tests:  Recent Labs Lab 04/09/13 1855 04/10/13 0200 04/12/13 0355 04/13/13 0543 04/14/13 0435  AST 29 28  --  24  --   ALT 20 16  --  22  --   ALKPHOS 66 57  --  83  --   BILITOT 0.4 0.3  --  0.2*  --   PROT 5.3* 4.3*  --  4.9*  --   ALBUMIN 2.9* 2.3* 1.9* 1.9* 2.1*   CBC:  Recent Labs Lab 04/09/13 1855  04/11/13 0430 04/12/13 0355 04/13/13 0543 04/14/13 0435 04/15/13 1015  WBC 17.8*  < > 10.4 9.8 8.0 5.5 7.2  NEUTROABS 16.0*  --   --   --   --   --  5.9  HGB 10.4*  < > 8.5* 8.2* 8.2* 8.4* 8.9*  HCT 33.1*  < > 27.8* 26.4* 26.2* 26.5* 27.9*  MCV 94.6  < > 94.9 95.7 93.2 92.0 92.4  PLT 157  < > 106* 85* 96* 114* 122*  < > = values in this interval not displayed. Cardiac Enzymes:  Recent Labs Lab 04/10/13 0200 04/10/13 0958  TROPONINI <0.30 <0.30   BNP (last 3 results)  Recent Labs  04/09/13 1856  PROBNP 21940.0*   CBG:  Recent Labs Lab 04/14/13 1559 04/14/13 2119 04/15/13 0610 04/15/13 1114 04/15/13 1558  GLUCAP 119* 83 92 125* 146*    Recent Results (from the past 240 hour(s))  CULTURE, BLOOD (ROUTINE X 2)     Status: None   Collection Time    04/09/13  6:45 PM      Result Value Range Status   Specimen Description BLOOD  RIGHT ARM   Final   Special Requests BOTTLES DRAWN AEROBIC ONLY [facility]   Final   Culture  Setup Time 04/10/2013 01:04   Final   Culture     Final   Value:        BLOOD CULTURE RECEIVED NO GROWTH  TO DATE CULTURE WILL BE HELD FOR 5 DAYS BEFORE ISSUING A FINAL NEGATIVE REPORT   Report Status PENDING   Incomplete  CULTURE, BLOOD (ROUTINE X 2)     Status: None   Collection Time    04/09/13  6:50 PM      Result Value Range Status   Specimen Description BLOOD RIGHT WRIST   Final   Special Requests BOTTLES DRAWN AEROBIC ONLY [facility]   Final   Culture  Setup Time 04/10/2013 01:04   Final   Culture     Final   Value:        BLOOD CULTURE RECEIVED NO GROWTH TO DATE CULTURE WILL BE HELD FOR 5 DAYS BEFORE ISSUING A FINAL NEGATIVE REPORT   Report Status PENDING   Incomplete  URINE CULTURE     Status: None   Collection Time    04/09/13  7:44 PM      Result Value Range Status   Specimen Description URINE, CATHETERIZED   Final   Special Requests NONE   Final   Culture  Setup Time 04/09/2013 20:44   Final   Colony Count NO GROWTH   Final   Culture NO GROWTH   Final   Report Status 04/10/2013 FINAL   Final  MRSA PCR SCREENING     Status: None   Collection Time    04/10/13 12:04 AM      Result Value Range Status   MRSA by PCR NEGATIVE  NEGATIVE Final   Comment:            The GeneXpert MRSA Assay (FDA     approved for NASAL specimens     only), is one component of a     comprehensive MRSA colonization     surveillance program. It is not     intended to diagnose MRSA     infection nor to guide or     monitor treatment for     MRSA infections.    Bayard Hugger MD [phone]  On-Call/Text Page:      [email or web address]      password [email or web address]  If 7PM-7AM, please contact night-coverage [email or web address] Password [email or web address] 04/15/2013, 5:16 PM   LOS: 6 days

## 2013-04-16 ENCOUNTER — Inpatient Hospital Stay (HOSPITAL_COMMUNITY): Payer: PRIVATE HEALTH INSURANCE

## 2013-04-16 LAB — GLUCOSE, CAPILLARY: Glucose-Capillary: 84 mg/dL (ref 70–99)

## 2013-04-16 LAB — RENAL FUNCTION PANEL
CO2: 26 mEq/L (ref 19–32)
Calcium: 8.6 mg/dL (ref 8.4–10.5)
Chloride: 97 mEq/L (ref 96–112)
GFR calc Af Amer: 23 mL/min — ABNORMAL LOW (ref >90)
Glucose, Bld: 100 mg/dL — ABNORMAL HIGH (ref 70–99)
Sodium: 139 mEq/L (ref 135–145)

## 2013-04-16 LAB — CULTURE, BLOOD (ROUTINE X 2): Culture: NO GROWTH

## 2013-04-16 MED ORDER — LOPERAMIDE HCL 2 MG PO CAPS
2.0000 mg | ORAL_CAPSULE | ORAL | Status: DC | PRN
  Administered 2013-04-17 – 2013-04-18 (×2): 2 mg via ORAL
  Filled 2013-04-16 (×2): qty 1
  Filled 2013-04-16: qty 2

## 2013-04-16 MED ORDER — ENOXAPARIN SODIUM 30 MG/0.3ML ~~LOC~~ SOLN
30.0000 mg | SUBCUTANEOUS | Status: DC
  Administered 2013-04-16 – 2013-04-22 (×6): 30 mg via SUBCUTANEOUS
  Filled 2013-04-16 (×8): qty 0.3

## 2013-04-16 NOTE — Progress Notes (Signed)
I have contacted Dr. Blake Divine for an inpt rehab consult. Recommended on 5/29. [phone]

## 2013-04-16 NOTE — Progress Notes (Signed)
Subjective:  The patient is not experiencing any chest pain or shortness of breath.  His diarrhea has recurred.  He is no longer on colchicine.  He remains on Zosyn.  Objective:  Vital Signs in the last 24 hours: Temp:  [98.2 F (36.8 C)-98.6 F (37 C)] 98.2 F (36.8 C) (06/02 0336) Pulse Rate:  [81-112] 89 (06/02 0336) Resp:  [18-20] 20 (06/02 0336) BP: (158-175)/(80-92) 168/92 mmHg (06/02 0336) SpO2:  [99 %-100 %] 100 % (06/02 0336) Weight:  [178 lb 12.8 oz (81.103 kg)] 178 lb 12.8 oz (81.103 kg) (06/02 0336)  Intake/Output from previous day: 06/01 0701 - 06/02 0700 In: 253 [I.V.:3; IV Piggyback:250] Out: 978 [Urine:975; Stool:3] Intake/Output from this shift: Total I/O In: -  Out: 200 [Urine:200]  . antiseptic oral rinse  15 mL Mouth Rinse BID  . aspirin EC  325 mg Oral Daily  . atorvastatin  40 mg Oral q1800  . carvedilol  12.5 mg Oral BID WC  . darbepoetin (ARANESP) injection - NON-DIALYSIS  100 mcg Subcutaneous Q Thu-1800  . enoxaparin (LOVENOX) injection  40 mg Subcutaneous Q24H  . famotidine  20 mg Oral Daily  . furosemide  80 mg Oral BID  . hydrocerin   Topical BID  . insulin aspart  0-5 Units Subcutaneous QHS  . insulin aspart  0-9 Units Subcutaneous TID WC  . insulin detemir  10 Units Subcutaneous Daily  . mycophenolate  500 mg Oral BID  . omega-3 acid ethyl esters  1 g Oral Daily  . piperacillin-tazobactam (ZOSYN)  IV  3.375 g Intravenous Q8H  . predniSONE  5 mg Oral BID  . pyridOXINE  200 mg Oral Daily  . sodium chloride  3 mL Intravenous Q12H  . vitamin B-12  250 mcg Oral Daily  . vitamin C  500 mg Oral Daily      Physical Exam: The patient appears to be in no distress.  Head and neck exam reveals that the pupils are equal and reactive.  His eyes are somewhat proptotic but thyroid function is normal. The extraocular movements are full.  There is no scleral icterus.  Mouth and pharynx are benign.  No lymphadenopathy.  No carotid bruits.  The jugular  venous pressure is normal.  Thyroid is not enlarged or tender.  Chest is clear to percussion and auscultation.  No rales or rhonchi.  Expansion of the chest is symmetrical.  Heart reveals no abnormal lift or heave.  First and second heart sounds are normal.  There is no  gallop rub or click.  There is a grade 2/6 apical regurgitant murmur.  The abdomen is soft and nontender.  Bowel sounds are normoactive.  There is no hepatosplenomegaly or mass.  There are no abdominal bruits.  Extremities reveal mild peripheral edema.  Neurologic exam is normal strength and no lateralizing weakness.  No sensory deficits.  Integument reveals mild lower extremity erythema  Lab Results:  Recent Labs  04/14/13 0435 04/15/13 1015  WBC 5.5 7.2  HGB 8.4* 8.9*  PLT 114* 122*    Recent Labs  04/14/13 0435 04/16/13 0535  NA 138 139  K 3.7 4.1  CL 100 97  CO2 23 26  GLUCOSE 142* 100*  BUN 61* 51*  CREATININE 3.00* 3.07*   No results found for this basename: TROPONINI, CK, MB,  in the last 72 hours Hepatic Function Panel  Recent Labs  04/16/13 0535  ALBUMIN 2.2*   No results found for this basename: CHOL,  in the last 72 hours No results found for this basename: PROTIME,  in the last 72 hours  Imaging: Imaging results have been reviewed  Cardiac Studies: Telemetry shows paced ventricular rhythm.  No P waves seen. Assessment/Plan:  1. Large pericardial effusion without tamponade  2. Acute on chronic systolic congestive heart failure  3. Biventricular ICD placement  4. Chronic kidney disease with previous transplant  5. Diarrhea initially stopped after stopping colchicine and has now recurred.  Plan: The patient is diuresing well and his weight is down considerably.  There is no clinical evidence of tamponade.  We will repeat his echocardiogram to follow the size of his pericardial effusion. Further workup of his diarrhea per primary.   LOS: 7 days    [name] 04/16/2013,  8:37 AM

## 2013-04-16 NOTE — Progress Notes (Signed)
Occupational Therapy Treatment Patient Details Name: [name] MRN: [id number] DOB: Aug 11, 1946 Today's Date: 04/16/2013 Time: 0865-7846 OT Time Calculation (min): 17 min  OT Assessment / Plan / Recommendation Comments on Treatment Session 67 year old male patient on chronic immunosuppression after renal transplant remotely. Had lumbar decompression surgery 2 weeks prior to admission. Patient's wife brought him to the ER because he was confused and short of breath. In the ER patient was febrile and he was noted to have right lower extremity erythematous changes that appear to be consistent with cellulitis. Patient had a prior history of left septic knee and had been followed by ID at that time. Patient was also hypoglycemic with a blood sugar of 50. 24 hours prior to presenting to the ER patient had been doing well since his surgery Pt making steady progress, however, limited by weakness and pain. s/p fall 01/14. s/p back surgery @ 3 weeks ago. recent falls.Continue to feel that pt is an excellent CIR candidate. Will follow acutely. Asked wife to bring in pt's AFO.    Follow Up Recommendations  CIR    Barriers to Discharge       Equipment Recommendations  None recommended by OT    Recommendations for Other Services Rehab consult  Frequency Min 2X/week   Plan Discharge plan remains appropriate    Precautions / Restrictions Precautions Precautions: Back;Fall Precaution Comments: reviewed back precautions with bed mobility. pt does not follow   Pertinent Vitals/Pain 12 L hip/knee when walking    ADL  Toilet Transfer: +2 Total assistance Toilet Transfer: Patient Percentage: 60% Toilet Transfer Method: Sit to stand;Stand pivot Toilet Transfer Equipment: Other (comment) (simulated) Equipment Used: Gait belt;Rolling walker Transfers/Ambulation Related to ADLs: Pt ambulated @ 39ft in room @ RW level with +2 . pt @ 70%. c/o pain with ambulation ADL Comments: Wife present for session.  Educated pt on back precautions, however, pt requires max vc to follow    OT Diagnosis:    OT Problem List:   OT Treatment Interventions:     OT Goals Acute Rehab OT Goals OT Goal Formulation: With patient Time For Goal Achievement: 04/26/13 Potential to Achieve Goals: Good ADL Goals Pt Will Perform Grooming: with supervision;Standing at sink ADL Goal: Grooming - Progress: Progressing toward goals Pt Will Perform Lower Body Bathing: with supervision;Sit to stand from bed;with adaptive equipment ADL Goal: Lower Body Bathing - Progress: Progressing toward goals Pt Will Perform Lower Body Dressing: with supervision;Sit to stand from bed;with adaptive equipment ADL Goal: Lower Body Dressing - Progress: Progressing toward goals Pt Will Transfer to Toilet: with supervision;Ambulation;Comfort height toilet;Grab bars;Maintaining back safety precautions ADL Goal: Toilet Transfer - Progress: Progressing toward goals Pt Will Perform Toileting - Clothing Manipulation: with supervision;Standing ADL Goal: Toileting - Clothing Manipulation - Progress: Progressing toward goals Pt Will Perform Toileting - Hygiene: with modified independence;Sitting on 3-in-1 or toilet ADL Goal: Toileting - Hygiene - Progress: Progressing toward goals Pt Will Perform Tub/Shower Transfer: Shower transfer;with supervision;Ambulation;with DME;Maintaining back safety precautions ADL Goal: Tub/Shower Transfer - Progress: Progressing toward goals Miscellaneous OT Goals Miscellaneous OT Goal #1: Pt will perform bed mobility modified independently in prep for ADL. OT Goal: Miscellaneous Goal #1 - Progress: Progressing toward goals  Visit Information  Last OT Received On: 04/16/13 Assistance Needed: +2 PT/OT Co-Evaluation/Treatment: Yes    Subjective Data      Prior Functioning       Cognition  Cognition Arousal/Alertness: Awake/alert Behavior During Therapy: WFL for tasks assessed/performed Overall Cognitive  Status:  Within Functional Limits for tasks assessed    Mobility  Bed Mobility Bed Mobility: Rolling Right;Right Sidelying to Sit;Sitting - Scoot to Edge of Bed;Sit to Supine Rolling Right: 3: Mod assist Rolling Right: Patient Percentage: 50% Right Sidelying to Sit: 3: Mod assist;HOB flat;With rails Right Sidelying to Sit: Patient Percentage: 50% Sitting - Scoot to Edge of Bed: 4: Min guard Sit to Supine: 1: +1 Total assist;Other (comment);HOB flat (A to lift BLE) Details for Bed Mobility Assistance: max vc for log rolling/back precautions Transfers Transfers: Sit to Stand;Stand to Sit Sit to Stand: 1: +2 Total assist;From bed;From chair/3-in-1;With upper extremity assist;With armrests Sit to Stand: Patient Percentage: 60% Stand to Sit: 1: +2 Total assist;To bed Stand to Sit: Patient Percentage: 50% Details for Transfer Assistance: pt bending back when sitting.     Exercises  Other Exercises Other Exercises: encouraged BUE AROM   Balance     End of Session OT - End of Session Equipment Utilized During Treatment: Gait belt Activity Tolerance: Patient tolerated treatment well Patient left: in bed;with call bell/phone within reach;with family/visitor present Nurse Communication: Mobility status  GO     Shereese Bonnie,HILLARY 04/16/2013, 3:33 PM [facility], OTR/L  [phone] 04/16/2013

## 2013-04-16 NOTE — Care Management Note (Signed)
    Page 1 of 1   04/23/2013     5:01:51 PM   CARE MANAGEMENT NOTE 04/23/2013  Patient:  ALESSIO, BOGAN   Account Number:  [id number]  Date Initiated:  04/10/2013  Documentation initiated by:  Alvira Philips Assessment:   67 yr-old adm with dx of SIRS; lives with spouse, has h/o home health with [place]     Action/Plan:   Anticipated DC Date:  04/19/2013   Anticipated DC Plan:  [facility] FAC[facility]      DC Planning Services  CM consult      Choice offered to / List presented to:             Status of service:  Completed, signed off Medicare Important Message given?   (If response is "NO", the following Medicare IM given date fields will be blank) Date Medicare IM given:   Date Additional Medicare IM given:    Discharge Disposition:  [facility] REHAB FACILITY  Per UR Regulation:  Reviewed for med. necessity/level of care/duration of stay  If discussed at Long Length of Stay Meetings, dates discussed:    Comments:  PCP:  Dr [name]  04/19/13 JULIE AMERSON,RN,BSN [id number] NEUROSURGERY TODAY.  REHAB CONT TO FOLLOW POST OP.  04/16/13 JULIE AMERSON,RN,BSN [id number] PT/OT RECOMMENDING CIR.  MD TO ORDER REHAB CONSULT.  WILL FOLLOW.

## 2013-04-16 NOTE — Progress Notes (Signed)
Physical Therapy Treatment Patient Details Name: [name] MRN: [id number] DOB: May 28, 1946 Today's Date: 04/16/2013 Time: 4098-1191 PT Time Calculation (min): 17 min  PT Assessment / Plan / Recommendation Comments on Treatment Session  Pt making some minimal progress towards PT goals at this time. Willing to participate despite increased pain. Will continue to work with patient and progress activity as tolerated. Feel patient is a good candidate for CIR upon discharge to address deficits and maximize function.    Follow Up Recommendations  CIR           Equipment Recommendations  Rolling walker with 5" wheels    Recommendations for Other Services Rehab consult  Frequency Min 3X/week   Plan Discharge plan remains appropriate;Frequency remains appropriate    Precautions / Restrictions Precautions Precautions: Back;Fall Precaution Comments: reviewed back precautions with bed mobility. pt does not follow   Pertinent Vitals/Pain 10/10 paint    Mobility  Bed Mobility Bed Mobility: Rolling Right;Right Sidelying to Sit;Sitting - Scoot to Edge of Bed;Sit to Supine Rolling Right: 3: Mod assist Rolling Right: Patient Percentage: 50% Right Sidelying to Sit: 3: Mod assist;HOB flat;With rails Right Sidelying to Sit: Patient Percentage: 50% Sitting - Scoot to Edge of Bed: 4: Min guard Sit to Supine: 1: +1 Total assist;Other (comment);HOB flat (A to lift BLE) Details for Bed Mobility Assistance: max vc for log rolling/back precautions Transfers Transfers: Sit to Stand;Stand to Sit Sit to Stand: 1: +2 Total assist;From bed;From chair/3-in-1;With upper extremity assist;With armrests Sit to Stand: Patient Percentage: 60% Stand to Sit: 1: +2 Total assist;To bed Stand to Sit: Patient Percentage: 50% Details for Transfer Assistance: pt bending back when sitting.  Ambulation/Gait Ambulation/Gait Assistance: 1: +2 Total assist Ambulation/Gait: Patient Percentage: 70% Ambulation Distance  (Feet): 22 Feet Assistive device: Rolling walker Ambulation/Gait Assistance Details: Increased assist for control of rw; patient very unsteady and requires increased assist to control; use of rw.   Gait Pattern: Step-to pattern;Shuffle;Antalgic Gait velocity: decreased Stairs: No    Exercises Other Exercises Other Exercises: encouraged BLE AROM     PT Goals Acute Rehab PT Goals PT Goal: Sit to Supine/Side - Progress: Progressing toward goal PT Goal: Sit to Stand - Progress: Progressing toward goal PT Goal: Stand to Sit - Progress: Progressing toward goal PT Goal: Ambulate - Progress: Progressing toward goal  Visit Information  Last PT Received On: 04/16/13 Assistance Needed: +2    Subjective Data  Subjective: If it was on a 0-10 scale it would be like a 25 before"  (YN:[id number] L Left hip)   Cognition  Cognition Arousal/Alertness: Awake/alert Behavior During Therapy: WFL for tasks assessed/performed Overall Cognitive Status: Within Functional Limits for tasks assessed    Balance     End of Session PT - End of Session Equipment Utilized During Treatment: Gait belt Activity Tolerance: Patient tolerated treatment well Patient left: with call bell/phone within reach;in chair Nurse Communication: Mobility status   GP     Fabio Asa 04/16/2013, 4:28 PM

## 2013-04-16 NOTE — Progress Notes (Signed)
[facility] Progress Note    MIKHAIL HALLENBECK OZH:[id number] DOB: 10/22/1946 DOA: 04/09/2013 PCP: [name], MD  Brief narrative: 67 year old male patient on chronic immunosuppression after renal transplant remotely. Had lumbar decompression surgery 2 weeks prior to admission. Patient's wife brought him to the ER because he was confused and short of breath. In the ER patient was febrile and he was noted to have right lower extremity erythematous changes that appear to be consistent with cellulitis. Patient had a prior history of left septic knee and had been followed by ID at that time. Patient was also hypoglycemic with a blood sugar of 50. 24 hours prior to presenting to the ER patient had been doing well since his surgery. He had been having right knee pain but denied any back pain at the surgical site and had not had any drainage from the surgical site. Chest x-ray showed possible central vascular congestion - the patient had mildly elevated troponin. He also had a paced rhythm with a bundle branch block. He also appeared to be in acute renal failure.  Assessment/Plan:    SIRS (systemic inflammatory response syndrome) -likely source RLE cellulitis -CT lumbar spine ? possible abscess (epidural air fluid collection) but no gen inflammatory changes - not felt to be infectious changes per Neurosurgery -cont empiric anbx's and supportive care; follow all cx's    Cellulitis of right lower extremity -CT knee without osteo but MRI preferred imaging and can't obtain due to pacemaker -tiny joint effusion and SQ edema but no focal fluid collection -clinically improving, will transition to oral antibiotics, should help withdiarrhea.    Renal failure (ARF), acute on chronic kidney disease stage 3 -nephrology clarified with pt's primary nephrologist that baseline Scr is 1.8 -Volume overloaded - PO Lasix started 5/28 and changed to IV dosing 5/29 -Scr rising but needs add'l diuresis so Renal  increased Lasix dose to 100 mg TID 5/30 - expect with diuresis Scr should decrease -appreciate Renal assistance    Demand ischemia/CAD (coronary artery disease) -subsequent enzymes have been normal and no CP -cont Coreg and ASA    Systolic Heart Failure/EF 30-35%/Grade 2 Diastolic Heart Failure -baseline EF 25% in March 2013 after PEA arrest requiring PPM/ICD -cont coreg-doubt can use ACE with CKD and h/o renal transplant -Cards following -diuresing with IV Lasix     Large Pericardial Effusion -cards managing - seen on ECHO this admit -started on Colchicine per Cards, but was giving him diarrhea, hence it was stopped.  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 -IV iron this admit -hgb stable -was on oral B12 pre admit- check level here- may not be absorbing thru gut and may need injectable replacement    Positive D dimer -VQ scan with VERY low probability for PE     History of renal transplant -cont Prednisone and Cellcept for now -Nephrology following     Acute respiratory failure with hypoxia   -improved - oxygen weaned to RA     Exopthalmus -TSH 0.795    PVD -had purple discolored areas to left great toe and bases of toes left foot and foot cool to touch 5/27 - 5/28 edema and pallor/cooleness resolved -doppler pulse present -pt endorsed vascular studies 4 months ago at [facility] -follow    HTN (hypertension) -BP controlled  -cont home meds    Diabetes mellitus type 2, controlled/hypoglycemia -Levemir at home on hold -  CBG much improved at this time    Hx of decompressive lumbar laminectomy (L-3) May12, 2014 -?of epidural abscess as per CT scan of spine -pain better after adjustments in narcs 5/28 -formally consulted Dr.Kritzer - he has reviewed the CT lumbar spine and does not feel changes are c/w infection -PT/OT recommending CIR when medically stable  History of  atrial fib -occurred March 2013- pt refused anti coagulation  RUE edema -follow- had IV in same arm and if edema persists or worsens despite diuiresis may need venous duplex to r/o DVT -currently decreasing with elevation and diuresis   Diarrhea: possibly related to colchicine and antibiotics. c diff pcr negative. Colchicine stopped, will try to transition to oral antibiotics soon.   DVT prophylaxis:  Lovenox   Code Status: Full Family Communication: Patient Disposition Plan: pending CIR. Isolation: None  Consultants: Dr. Gerlene Fee with Neurosurgery Cardiology Nephrology  Procedures: 2-D echocardiogram  - Left ventricle: The cavity size was moderately dilated. There was mild focal basal hypertrophy of the septum. Systolic function was moderately to severely reduced. The estimated ejection fraction was in the range of 30% to 35%. There is akinesis of the inferior posterior myocardium. There is hypokinesis of the lateral myocardium. Features are consistent with a pseudonormal left ventricular filling pattern, with concomitant abnormal relaxation and increased filling pressure (grade 2 diastolic dysfunction). - Aortic valve: Trivial regurgitation. - Mitral valve: Moderate regurgitation. - Left atrium: The atrium was moderately dilated. - Right ventricle: The cavity size was mildly dilated. Systolic function was severely reduced. - Right atrium: The atrium was mildly dilated. - Pulmonary arteries: PA peak pressure: 39mm Hg (S). - Pericardium, extracardiac: A large pericardial effusion was identified. Impressions: - Inferoposterior akinesis and lateral hypokinesis with moderate to severe reduction in LV function; MR difficult to quantitate due to eccentric nature of jet but probably moderate; RAE/RVE with severely reduced RV function; large pericardial effusion with mild RA collapse; no RV diastolic collapse; IVC mildly dilated with blunted response to inspiration.  Lower  extremity venous duplex  negative for DVT  Antibiotics: Zosyn 5/26 >>> Vancomycin 5/26 >>> 5/30  HPI/Subjective: Patient alert and now reports significant improvement in low back pain and hip pain. No SOB or CP.   Objective: Blood pressure 170/80, pulse 76, temperature 98.4 F (36.9 C), temperature source Oral, resp. rate 19, height 6\' 1"  (1.854 m), weight 81.103 kg (178 lb 12.8 oz), SpO2 95.00%.  Intake/Output Summary (Last 24 hours) at 04/16/13 1718 Last data filed at 04/16/13 1700  Gross per 24 hour  Intake    560 ml  Output   1858 ml  Net  -1298 ml    Exam: General: No acute respiratory distress Lungs: Clear to auscultation bilaterally without wheezes or crackles, RA Cardiovascular: Regular rate and ventricular paced rhythm without murmur gallop or rub normal S1 and S2, no JVD but does have 2+ pitting edema of right foot Abdomen: Nontender, nondistended, soft, bowel sounds positive, no rebound, no ascites, no appreciable mass Musculoskeletal: No significant cyanosis, clubbing of bilateral lower extremities Neurological: Alert and oriented x 3, moves all extremities x 4 without focal neurological deficits, CN 2-12 intact Integumentary: Patient has diffuse papular scaly lesions throughout entire body that he reports has been present for greater than 20 years; has decreased erythematous skin changes on the right lower extremity extending from the foot to below the knee - resolved erythematous changes on the left lower extremity mainly involving the calf  Scheduled Meds: Scheduled Meds: . antiseptic oral rinse  15 mL Mouth Rinse BID  . aspirin EC  325 mg Oral Daily  . atorvastatin  40 mg Oral q1800  . carvedilol  12.5 mg Oral BID WC  . darbepoetin (ARANESP) injection - NON-DIALYSIS  100 mcg Subcutaneous Q Thu-1800  . enoxaparin (LOVENOX) injection  30 mg Subcutaneous Q24H  . famotidine  20 mg Oral Daily  . furosemide  80 mg Oral BID  . hydrocerin   Topical BID  . insulin  aspart  0-5 Units Subcutaneous QHS  . insulin aspart  0-9 Units Subcutaneous TID WC  . insulin detemir  10 Units Subcutaneous Daily  . mycophenolate  500 mg Oral BID  . omega-3 acid ethyl esters  1 g Oral Daily  . piperacillin-tazobactam (ZOSYN)  IV  3.375 g Intravenous Q8H  . predniSONE  5 mg Oral BID  . pyridOXINE  200 mg Oral Daily  . sodium chloride  3 mL Intravenous Q12H  . vitamin B-12  250 mcg Oral Daily  . vitamin C  500 mg Oral Daily   Data Reviewed: Basic Metabolic Panel:  Recent Labs Lab 04/11/13 0430 04/12/13 0355 04/13/13 0543 04/14/13 0435 04/16/13 0535  NA 135 136 135 138 139  K 4.3 4.6 4.4 3.7 4.1  CL 103 101 100 100 97  CO2 19 21 21 23 26   GLUCOSE 208* 119* 165* 142* 100*  BUN 58* 60* 62* 61* 51*  CREATININE 2.38* 2.77* 2.93* 3.00* 3.07*  CALCIUM 7.9* 8.6 8.8 8.7 8.6  MG  --   --  1.8 1.7 1.7  PHOS  --  5.2* 5.6* 5.2* 4.7*   Liver Function Tests:  Recent Labs Lab 04/09/13 1855 04/10/13 0200 04/12/13 0355 04/13/13 0543 04/14/13 0435 04/16/13 0535  AST 29 28  --  24  --   --   ALT 20 16  --  22  --   --   ALKPHOS 66 57  --  83  --   --   BILITOT 0.4 0.3  --  0.2*  --   --   PROT 5.3* 4.3*  --  4.9*  --   --   ALBUMIN 2.9* 2.3* 1.9* 1.9* 2.1* 2.2*   CBC:  Recent Labs Lab 04/09/13 1855  04/11/13 0430 04/12/13 0355 04/13/13 0543 04/14/13 0435 04/15/13 1015  WBC 17.8*  < > 10.4 9.8 8.0 5.5 7.2  NEUTROABS 16.0*  --   --   --   --   --  5.9  HGB 10.4*  < > 8.5* 8.2* 8.2* 8.4* 8.9*  HCT 33.1*  < > 27.8* 26.4* 26.2* 26.5* 27.9*  MCV 94.6  < > 94.9 95.7 93.2 92.0 92.4  PLT 157  < > 106* 85* 96* 114* 122*  < > = values in this interval not displayed. Cardiac Enzymes:  Recent Labs Lab 04/10/13 0200 04/10/13 0958  TROPONINI <0.30 <0.30   BNP (last 3 results)  Recent Labs  04/09/13 1856  PROBNP 21940.0*   CBG:  Recent Labs Lab 04/15/13 1558 04/15/13 2120 04/16/13 0603 04/16/13 1123 04/16/13 1620  GLUCAP 146* 94 96 84 106*     Recent Results (from the past 240 hour(s))  CULTURE, BLOOD (ROUTINE X 2)     Status: None   Collection Time    04/09/13  6:45 PM      Result Value Range Status   Specimen Description BLOOD RIGHT ARM   Final   Special Requests BOTTLES DRAWN AEROBIC ONLY 6CC   Final   Culture  Setup Time 04/10/2013 01:04   Final   Culture NO GROWTH 5 DAYS   Final   Report Status 04/16/2013 FINAL   Final  CULTURE, BLOOD (ROUTINE X 2)     Status: None   Collection Time    04/09/13  6:50 PM      Result Value Range Status   Specimen Description BLOOD RIGHT WRIST   Final   Special Requests BOTTLES DRAWN AEROBIC ONLY 2CC   Final   Culture  Setup Time 04/10/2013 01:04   Final   Culture NO GROWTH 5 DAYS   Final   Report Status 04/16/2013 FINAL   Final  URINE CULTURE     Status: None   Collection Time    04/09/13  7:44 PM      Result Value Range Status   Specimen Description URINE, CATHETERIZED   Final   Special Requests NONE   Final   Culture  Setup Time 04/09/2013 20:44   Final   Colony Count NO GROWTH   Final   Culture NO GROWTH   Final   Report Status 04/10/2013 FINAL   Final  MRSA PCR SCREENING     Status: None   Collection Time    04/10/13 12:04 AM      Result Value Range Status   MRSA by PCR NEGATIVE  NEGATIVE Final   Comment:            The GeneXpert MRSA Assay (FDA     approved for NASAL specimens     only), is one component of a     comprehensive MRSA colonization     surveillance program. It is not     intended to diagnose MRSA     infection nor to guide or     monitor treatment for     MRSA infections.  CLOSTRIDIUM DIFFICILE BY PCR     Status: None   Collection Time    04/16/13  2:38 PM      Result Value Range Status   C difficile by pcr NEGATIVE  NEGATIVE Final    Kathlen Mody MD [phone]  On-Call/Text Page:      [email or web address]      password [email or web address]  If 7PM-7AM, please contact night-coverage [email or web address] Password [email or web address] 04/16/2013, 5:18 PM   LOS: 7 days

## 2013-04-16 NOTE — Progress Notes (Signed)
Patient ID: [name], male   DOB: 07-15-1946, 67 y.o.   MRN: [id number] S:c/o nausea and diarrhea O:BP 168/92  Pulse 89  Temp(Src) 98.2 F (36.8 C) (Oral)  Resp 20  Ht 6\' 1"  (1.854 m)  Wt 81.103 kg (178 lb 12.8 oz)  BMI 23.59 kg/m2  SpO2 100%  Intake/Output Summary (Last 24 hours) at 04/16/13 1009 Last data filed at 04/16/13 0750  Gross per 24 hour  Intake    253 ml  Output   1178 ml  Net   -925 ml   Intake/Output: I/O last 3 completed shifts: In: 253 [I.V.:3; IV Piggyback:250] Out: 3028 [Urine:3025; Stool:3]  Intake/Output this shift:  Total I/O In: -  Out: 200 [Urine:200] Weight change: -6.597 kg (-14 lb 8.7 oz) Gen:WD WN elderly WM in NAD CVS:no rub Resp:CTA ION:GEXBMW Ext:2+pitting edema   Recent Labs Lab 04/09/13 1855 04/10/13 0200 04/11/13 0430 04/12/13 0355 04/13/13 0543 04/14/13 0435 04/16/13 0535  NA 140 139 135 136 135 138 139  K 4.7 5.0 4.3 4.6 4.4 3.7 4.1  CL 103 104 103 101 100 100 97  CO2 24 21 19 21 21 23 26   GLUCOSE 55* 64* 208* 119* 165* 142* 100*  BUN 63* 62* 58* 60* 62* 61* 51*  CREATININE 2.44* 2.45* 2.38* 2.77* 2.93* 3.00* 3.07*  ALBUMIN 2.9* 2.3*  --  1.9* 1.9* 2.1* 2.2*  CALCIUM 9.1 8.2* 7.9* 8.6 8.8 8.7 8.6  PHOS  --   --   --  5.2* 5.6* 5.2* 4.7*  AST 29 28  --   --  24  --   --   ALT 20 16  --   --  22  --   --    Liver Function Tests:  Recent Labs Lab 04/09/13 1855 04/10/13 0200  04/13/13 0543 04/14/13 0435 04/16/13 0535  AST 29 28  --  24  --   --   ALT 20 16  --  22  --   --   ALKPHOS 66 57  --  83  --   --   BILITOT 0.4 0.3  --  0.2*  --   --   PROT 5.3* 4.3*  --  4.9*  --   --   ALBUMIN 2.9* 2.3*  < > 1.9* 2.1* 2.2*  < > = values in this interval not displayed. No results found for this basename: LIPASE, AMYLASE,  in the last 168 hours No results found for this basename: AMMONIA,  in the last 168 hours CBC:  Recent Labs Lab 04/09/13 1855  04/11/13 0430 04/12/13 0355 04/13/13 0543 04/14/13 0435  04/15/13 1015  WBC 17.8*  < > 10.4 9.8 8.0 5.5 7.2  NEUTROABS 16.0*  --   --   --   --   --  5.9  HGB 10.4*  < > 8.5* 8.2* 8.2* 8.4* 8.9*  HCT 33.1*  < > 27.8* 26.4* 26.2* 26.5* 27.9*  MCV 94.6  < > 94.9 95.7 93.2 92.0 92.4  PLT 157  < > 106* 85* 96* 114* 122*  < > = values in this interval not displayed. Cardiac Enzymes:  Recent Labs Lab 04/10/13 0200 04/10/13 0958  TROPONINI <0.30 <0.30   CBG:  Recent Labs Lab 04/15/13 0610 04/15/13 1114 04/15/13 1558 04/15/13 2120 04/16/13 0603  GLUCAP 92 125* 146* 94 96    Iron Studies: No results found for this basename: IRON, TIBC, TRANSFERRIN, FERRITIN,  in the last 72 hours Studies/Results: No results found. Marland Kitchen  antiseptic oral rinse  15 mL Mouth Rinse BID  . aspirin EC  325 mg Oral Daily  . atorvastatin  40 mg Oral q1800  . carvedilol  12.5 mg Oral BID WC  . darbepoetin (ARANESP) injection - NON-DIALYSIS  100 mcg Subcutaneous Q Thu-1800  . enoxaparin (LOVENOX) injection  40 mg Subcutaneous Q24H  . famotidine  20 mg Oral Daily  . furosemide  80 mg Oral BID  . hydrocerin   Topical BID  . insulin aspart  0-5 Units Subcutaneous QHS  . insulin aspart  0-9 Units Subcutaneous TID WC  . insulin detemir  10 Units Subcutaneous Daily  . mycophenolate  500 mg Oral BID  . omega-3 acid ethyl esters  1 g Oral Daily  . piperacillin-tazobactam (ZOSYN)  IV  3.375 g Intravenous Q8H  . predniSONE  5 mg Oral BID  . pyridOXINE  200 mg Oral Daily  . sodium chloride  3 mL Intravenous Q12H  . vitamin B-12  250 mcg Oral Daily  . vitamin C  500 mg Oral Daily    BMET    Component Value Date/Time   NA 139 04/16/2013 0535   K 4.1 04/16/2013 0535   CL 97 04/16/2013 0535   CO2 26 04/16/2013 0535   GLUCOSE 100* 04/16/2013 0535   BUN 51* 04/16/2013 0535   CREATININE 3.07* 04/16/2013 0535   CALCIUM 8.6 04/16/2013 0535   GFRNONAA 20* 04/16/2013 0535   GFRAA 23* 04/16/2013 0535   CBC    Component Value Date/Time   WBC 7.2 04/15/2013 1015   RBC 3.02* 04/15/2013 1015    HGB 8.9* 04/15/2013 1015   HCT 27.9* 04/15/2013 1015   PLT 122* 04/15/2013 1015   MCV 92.4 04/15/2013 1015   MCH 29.5 04/15/2013 1015   MCHC 31.9 04/15/2013 1015   RDW 16.6* 04/15/2013 1015   LYMPHSABS 0.9 04/15/2013 1015   MONOABS 0.4 04/15/2013 1015   EOSABS 0.1 04/15/2013 1015   BASOSABS 0.0 04/15/2013 1015     Assessment/Plan:  1. 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  AKI likely related to aggressive diuresis and ongoing diarrhea.  Switched over to po lasix and will cont to follow 2. CHF- markedly improved volume status.  EF 30-35%.  On lower dose of po lasix. 3. Pericardial effusion without tamponade. Agree with repeat ECHO 4. DM- stable 5. Diarrhea/Nausea- may benefit from immodium or lomotil.  Likely related to ongoing abx. 6. H/o Vfib arrest, s/p AICD 7. Immunosuppressive therapy- cont with MMF and pred 8. Cellulitis- on abx 9. Anemia- normocytic.  May benefit from ESA.  Will cont to follow  Jeanetta Alonzo A

## 2013-04-17 ENCOUNTER — Inpatient Hospital Stay (HOSPITAL_COMMUNITY): Payer: PRIVATE HEALTH INSURANCE

## 2013-04-17 ENCOUNTER — Encounter (HOSPITAL_COMMUNITY): Payer: Self-pay

## 2013-04-17 DIAGNOSIS — R5381 Other malaise: Secondary | ICD-10-CM

## 2013-04-17 DIAGNOSIS — I319 Disease of pericardium, unspecified: Secondary | ICD-10-CM

## 2013-04-17 LAB — GI PATHOGEN PANEL BY PCR, STOOL
C difficile toxin A/B: NEGATIVE
Campylobacter by PCR: NEGATIVE
E coli (ETEC) LT/ST: NEGATIVE
E coli (STEC): NEGATIVE
Rotavirus A by PCR: NEGATIVE

## 2013-04-17 LAB — BASIC METABOLIC PANEL
CO2: 28 mEq/L (ref 19–32)
Calcium: 8.9 mg/dL (ref 8.4–10.5)
Chloride: 97 mEq/L (ref 96–112)
Glucose, Bld: 133 mg/dL — ABNORMAL HIGH (ref 70–99)
Sodium: 138 mEq/L (ref 135–145)

## 2013-04-17 LAB — GLUCOSE, CAPILLARY
Glucose-Capillary: 136 mg/dL — ABNORMAL HIGH (ref 70–99)
Glucose-Capillary: 116 mg/dL — ABNORMAL HIGH (ref 70–99)
Glucose-Capillary: 115 mg/dL — ABNORMAL HIGH (ref 70–99)
Glucose-Capillary: 156 mg/dL — ABNORMAL HIGH (ref 70–99)

## 2013-04-17 MED ORDER — ALUM & MAG HYDROXIDE-SIMETH 200-200-20 MG/5ML PO SUSP
15.0000 mL | Freq: Three times a day (TID) | ORAL | Status: DC | PRN
  Administered 2013-04-17 – 2013-04-18 (×2): 15 mL via ORAL
  Filled 2013-04-17: qty 60
  Filled 2013-04-17: qty 30

## 2013-04-17 MED ORDER — HYDROMORPHONE HCL PF 1 MG/ML IJ SOLN
INTRAMUSCULAR | Status: AC
  Administered 2013-04-17: 1 mg via INTRAVENOUS
  Filled 2013-04-17: qty 1

## 2013-04-17 MED ORDER — IOHEXOL 180 MG/ML  SOLN
20.0000 mL | Freq: Once | INTRAMUSCULAR | Status: AC | PRN
  Administered 2013-04-17: 8 mL via INTRATHECAL

## 2013-04-17 MED ORDER — PIPERACILLIN-TAZOBACTAM IN DEX 2-0.25 GM/50ML IV SOLN: 2.2500 g | Freq: Three times a day (TID) | INTRAVENOUS | Status: DC

## 2013-04-17 MED ORDER — FUROSEMIDE 80 MG PO TABS
80.0000 mg | ORAL_TABLET | Freq: Every day | ORAL | Status: DC
  Administered 2013-04-18 – 2013-04-23 (×6): 80 mg via ORAL
  Filled 2013-04-17 (×6): qty 1

## 2013-04-17 NOTE — Progress Notes (Signed)
Subjective:  Weight in Echart very erratic.  Will request reweight. Diarrhea has improved. Complains of nausea and indigestion this am. 2D echo not yet done.  To be done this am.  Objective:  Vital Signs in the last 24 hours: Temp:  [98.2 F (36.8 C)-98.4 F (36.9 C)] 98.2 F (36.8 C) (06/03 0624) Pulse Rate:  [76-100] 85 (06/03 0624) Resp:  [18-20] 18 (06/03 0624) BP: (163-170)/(80-99) 163/99 mmHg (06/03 0624) SpO2:  [95 %-100 %] 98 % (06/03 0624) Weight:  [197 lb 9.6 oz (89.631 kg)] 197 lb 9.6 oz (89.631 kg) (06/03 0624)  Intake/Output from previous day: 06/02 0701 - 06/03 0700 In: 490 [P.O.:240; IV Piggyback:250] Out: 2280 [Urine:2280] Intake/Output from this shift:    . antiseptic oral rinse  15 mL Mouth Rinse BID  . aspirin EC  325 mg Oral Daily  . atorvastatin  40 mg Oral q1800  . carvedilol  12.5 mg Oral BID WC  . darbepoetin (ARANESP) injection - NON-DIALYSIS  100 mcg Subcutaneous Q Thu-1800  . enoxaparin (LOVENOX) injection  30 mg Subcutaneous Q24H  . famotidine  20 mg Oral Daily  . furosemide  80 mg Oral BID  . hydrocerin   Topical BID  . insulin aspart  0-5 Units Subcutaneous QHS  . insulin aspart  0-9 Units Subcutaneous TID WC  . insulin detemir  10 Units Subcutaneous Daily  . mycophenolate  500 mg Oral BID  . omega-3 acid ethyl esters  1 g Oral Daily  . piperacillin-tazobactam (ZOSYN)  IV  3.375 g Intravenous Q8H  . predniSONE  5 mg Oral BID  . pyridOXINE  200 mg Oral Daily  . sodium chloride  3 mL Intravenous Q12H  . vitamin B-12  250 mcg Oral Daily  . vitamin C  500 mg Oral Daily      Physical Exam: The patient appears to be in no distress.  Head and neck exam reveals that the pupils are equal and reactive.  His eyes are somewhat proptotic but thyroid function is normal. The extraocular movements are full.  There is no scleral icterus.  Mouth and pharynx are benign.  No lymphadenopathy.  No carotid bruits.  The jugular venous pressure is normal.   Thyroid is not enlarged or tender.  Chest is clear to percussion and auscultation.  No rales or rhonchi.  Expansion of the chest is symmetrical.  Heart reveals no abnormal lift or heave.  First and second heart sounds are normal.  There is no  gallop rub or click.  There is a grade 2/6 apical regurgitant murmur.  The abdomen is soft and nontender.  Bowel sounds are normoactive.  There is no hepatosplenomegaly or mass.  There are no abdominal bruits.  Extremities reveal mild peripheral edema.  Neurologic exam is normal strength and no lateralizing weakness.  No sensory deficits.  Integument reveals mild lower extremity erythema  Lab Results:  Recent Labs  04/15/13 1015  WBC 7.2  HGB 8.9*  PLT 122*    Recent Labs  04/16/13 0535 04/17/13 0405  NA 139 138  K 4.1 3.9  CL 97 97  CO2 26 28  GLUCOSE 100* 133*  BUN 51* 51*  CREATININE 3.07* 3.18*   No results found for this basename: TROPONINI, CK, MB,  in the last 72 hours Hepatic Function Panel  Recent Labs  04/16/13 0535  ALBUMIN 2.2*   No results found for this basename: CHOL,  in the last 72 hours No results found for this basename:  PROTIME,  in the last 72 hours  Imaging: Imaging results have been reviewed  Cardiac Studies: Telemetry shows paced ventricular rhythm.  No P waves seen. Assessment/Plan:  1. Large pericardial effusion without tamponade  2. Acute on chronic systolic congestive heart failure  3. Biventricular ICD placement  4. Chronic kidney disease with previous transplant  5. Diarrhea improving. 6. Nausea and indigestion.  Plan: Reweigh the patient this am. Repeat limited echo this am to assess pericardial effusion. No clinical evidence of tamponade.   LOS: 8 days    Cassell Clement 04/17/2013, 8:02 AM

## 2013-04-17 NOTE — Consult Note (Signed)
Physical Medicine and Rehabilitation Consult Reason for Consult: Deconditioning/SIRS Referring Physician: [facility]   HPI: [name] is a 67 y.o. right-handed male with history of renal transplant, ICD/pacemaker, systolic congestive heart failure, diabetes mellitus with peripheral neuropathy and chronic right foot drop. Patient with recent low back surgery 2 weeks ago for L3 decompression discectomy brought to the emergency room 04/09/2013 with altered mental status mild shortness of breath. Patient noted to have low-grade fever. Right lower extremity erythematous below the knee. EKG paced rhythm with ST elevation in the inferior leads. CT of right knee showed no evidence of osteomyelitis there is a small joint effusion with subcutaneous edema diffusely about the knee likely due to possible cellulitis. Venous Doppler studies lower extremity  showed no signs of DVT. CT lumbar spine showed interval postsurgical changes lumbar L3-4. An epidural air-fluid collection extending inferiorly from the disc space behind the L4 vertebral body and exerts mass effect on the thecal sac. 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 Renal ultrasound showed no evidence of hydronephrosis involving the transplant kidney. Followup cardiology to assess abnormal EKG not felt to be significant with repeat EKGs showing no changes. Mildly elevated cardiac enzymes likely demand ischemia and no further workup indicated. Echocardiogram completed showing large pericardial effusion without tamponade on and again advised to follow. Chest x-ray with mild CHF receiving IV diuresis with Lasix per cardiology services. Patient maintained on broad-spectrum antibiotic for suspect cellulitis right lower extremity. Physical and occupational therapy evaluations completed an ongoing with recommendations of physical medicine rehabilitation consult to consider  inpatient rehabilitation services .   Review of Systems  Cardiovascular: Positive for leg swelling.  Musculoskeletal: Positive for myalgias and back pain.  Neurological: Positive for weakness.       Right foot drop  Psychiatric/Behavioral:       Anxiety  All other systems reviewed and are negative.   Past Medical History  Diagnosis Date  . Myocardial infarction 04/12/1990  . CHF (congestive heart failure) 01/2012  . Hypertension   . Diabetes mellitus   . ICD (implantable cardiac defibrillator) in place   . Pacemaker 02/15/2012    [employer]  . Pneumonia 2011  . Arthritis   . History of blood transfusion   . Chronic kidney disease     Glomerulonephritis  . Dysrhythmia   . Cancer     Bladder Cancer- 2004 .  SkinCancer- Basal, Squamous, and a few melymona  . Right foot drop    Past Surgical History  Procedure Laterality Date  . Kidney transplant  02/13/1973  . Coronary angioplasty  03/1990  . Skin cancer excision      Numerous  . Eye surgery      Cataract Right Eye  . Insert / replace / remove pacemaker    . Uretheral transplation      Left to Right (transplanted kidney)  . Skin transplant      Left thigh to Left hand  . Knee arthroscopy  07/25/2012    Procedure: ARTHROSCOPY KNEE;  Surgeon: Cammy Copa, MD;  Location: [facility] OR;  Service: Orthopedics;  Laterality: Left;  Left knee arthroscopy, debridement, cultures  . Lumbar laminectomy/decompression microdiscectomy Bilateral 03/26/2013    Procedure: LUMBAR LAMINECTOMY/DECOMPRESSION MICRODISCECTOMY 1 LEVEL;  Surgeon: Reinaldo Meeker, MD;  Location: [facility];  Service: Neurosurgery;  Laterality: Bilateral;   History reviewed. No pertinent family history. Social History:  reports that  he quit smoking about 30 years ago. He does not have any smokeless tobacco history on file. He reports that he does not drink alcohol or use illicit drugs. Allergies: No Known Allergies Medications Prior to Admission  Medication Sig  Dispense Refill  . ALPRAZolam (XANAX) 0.25 MG tablet Take 0.25 mg by mouth at bedtime as needed for sleep or anxiety.       Marland Kitchen aspirin EC 81 MG tablet Take 81 mg by mouth every evening.       . carvedilol (COREG) 12.5 MG tablet Take 12.5 mg by mouth 2 (two) times daily with a meal.      . fish oil-omega-3 fatty acids 1000 MG capsule Take 1 g by mouth daily.      . furosemide (LASIX) 40 MG tablet Take 40 mg by mouth daily as needed (if weight is above 173 lbs).      . insulin aspart (NOVOLOG) 100 UNIT/ML injection Inject 6 Units into the skin 3 (three) times daily before meals.      . insulin detemir (LEVEMIR) 100 UNIT/ML injection Inject 40 Units into the skin at bedtime.       . Multiple Vitamin (MULTIVITAMIN WITH MINERALS) TABS Take 1 tablet by mouth daily.      . mycophenolate (CELLCEPT) 500 MG tablet Take 500 mg by mouth 2 (two) times daily.       . nitroGLYCERIN (NITROSTAT) 0.4 MG SL tablet Place 0.4 mg under the tongue every 5 (five) minutes as needed for chest pain. x3 doses as needed for chest pain      . oxyCODONE-acetaminophen (PERCOCET/ROXICET) 5-325 MG per tablet Take 1 tablet by mouth every 4 (four) hours as needed for pain. For pain      . predniSONE (DELTASONE) 5 MG tablet Take 5 mg by mouth 2 (two) times daily.      Marland Kitchen pyridOXINE (VITAMIN B-6) 100 MG tablet Take 200 mg by mouth daily.      . ranitidine (ZANTAC) 300 MG tablet Take 300 mg by mouth at bedtime.       . simvastatin (ZOCOR) 80 MG tablet Take 80 mg by mouth at bedtime.      Marland Kitchen tobramycin (TOBREX) 0.3 % ophthalmic solution Place 1 drop into both eyes as needed (for allergies).      . vitamin B-12 (CYANOCOBALAMIN) 250 MCG tablet Take 250 mcg by mouth daily.      . vitamin C (ASCORBIC ACID) 500 MG tablet Take 500 mg by mouth daily.        Home: Home Living Lives With: Spouse Available Help at Discharge: Family Type of Home: House Home Access: Level entry Home Layout: One level Bathroom Shower/Tub: Architectural technologist: Handicapped height Bathroom Accessibility: Yes How Accessible: Accessible via wheelchair;Accessible via walker Home Adaptive Equipment: Grab bars around toilet;Grab bars in shower;Shower chair with back;Quad cane;Raised toilet seat with rails;Walker - rolling Additional Comments: Pt and wife built a fully handicap accessible house  Functional History: Prior Function Bath: Moderate Dressing: Moderate Able to Take Stairs?: No Driving: Yes Vocation: Retired Functional Status:  Mobility: Bed Mobility Bed Mobility: Rolling Right;Right Sidelying to Sit;Sitting - Scoot to [place] of Bed;Sit to Supine Rolling Right: 3: Mod assist Rolling Right: Patient Percentage: 50% Rolling Left: 1: +2 Total assist;With rail Rolling Left: Patient Percentage: 30% Right Sidelying to Sit: 3: Mod assist;HOB flat;With rails Right Sidelying to Sit: Patient Percentage: 50% Left Sidelying to Sit: 1: +2 Total assist Left Sidelying to Sit: Patient Percentage:  30% Sitting - Scoot to Edge of Bed: 4: Min guard Sit to Supine: 1: +1 Total assist;Other (comment);HOB flat (A to lift BLE) Sit to Sidelying Right: 1: +2 Total assist;HOB flat Sit to Sidelying Right: Patient Percentage: 40% Transfers Transfers: Sit to Stand;Stand to Sit Sit to Stand: 1: +2 Total assist;From bed;From chair/3-in-1;With upper extremity assist;With armrests Sit to Stand: Patient Percentage: 60% Stand to Sit: 1: +2 Total assist;To bed Stand to Sit: Patient Percentage: 50% Stand Pivot Transfers: 1: +2 Total assist Stand Pivot Transfers: Patient Percentage: 70% Ambulation/Gait Ambulation/Gait Assistance: 1: +2 Total assist Ambulation/Gait: Patient Percentage: 70% Ambulation Distance (Feet): 22 Feet Assistive device: Rolling walker Ambulation/Gait Assistance Details: Increased assist for control of rw; patient very unsteady and requires increased assist to control; use of rw.   Gait Pattern: Step-to  pattern;Shuffle;Antalgic Gait velocity: decreased Stairs: No Wheelchair Mobility Wheelchair Mobility: No  ADL: ADL Eating/Feeding: Independent Where Assessed - Eating/Feeding: Chair Grooming: Set up Where Assessed - Grooming: Supported sitting Upper Body Bathing: Minimal assistance Where Assessed - Upper Body Bathing: Unsupported sitting Lower Body Bathing: +1 Total assistance Where Assessed - Lower Body Bathing: Unsupported sitting;Supported sit to stand Upper Body Dressing: Performed;Set up Where Assessed - Upper Body Dressing: Unsupported sitting Lower Body Dressing: +1 Total assistance Where Assessed - Lower Body Dressing: Unsupported sitting;Supported sit to stand Toilet Transfer: +2 Total assistance Toilet Transfer Method: Sit to stand;Stand pivot Toilet Transfer Equipment: Other (comment) (simulated) Equipment Used: Gait belt;Rolling walker Transfers/Ambulation Related to ADLs: Pt ambulated @ 44ft in room @ RW level with +2 . pt @ 70%. c/o pain with ambulation ADL Comments: Wife present for session. Educated pt on back precautions, however, pt requires max vc to follow  Cognition: Cognition Overall Cognitive Status: Within Functional Limits for tasks assessed Arousal/Alertness: Awake/alert Orientation Level: Oriented X4 Cognition Arousal/Alertness: Awake/alert Behavior During Therapy: WFL for tasks assessed/performed Overall Cognitive Status: Within Functional Limits for tasks assessed  Blood pressure 167/99, pulse 87, temperature 98.2 F (36.8 C), temperature source Oral, resp. rate 18, height 6\' 1"  (1.854 m), weight 81.103 kg (178 lb 12.8 oz), SpO2 96.00%. Physical Exam  Vitals reviewed. Constitutional: He is oriented to person, place, and time.  HENT:  Head: Normocephalic.  Eyes: EOM are normal.  Neck: Normal range of motion. Neck supple. No thyromegaly present.  Cardiovascular: Normal rate and regular rhythm.   Pulmonary/Chest: Breath sounds normal. No  respiratory distress.  Abdominal: Soft. Bowel sounds are normal. He exhibits no distension.  Musculoskeletal:  Right foot drop  Neurological: He is alert and oriented to person, place, and time. No cranial nerve deficit. Coordination normal.  Follows full commands. Right ADF 0/5. LLE 2- prox to 4- distally. UE's 4/5. No sensory deficits.   Skin:  Back surgery site well healed with mild tenderness to palpate around surgical site. Right LE with mild erythema. Several areas of breakdown on exremities.  Psychiatric: He has a normal mood and affect.    Results for orders placed during the hospital encounter of 04/09/13 (from the past 24 hour(s))  RENAL FUNCTION PANEL     Status: Abnormal   Collection Time    04/16/13  5:35 AM      Result Value Range   Sodium 139  135 - 145 mEq/L   Potassium 4.1  3.5 - 5.1 mEq/L   Chloride 97  96 - 112 mEq/L   CO2 26  19 - 32 mEq/L   Glucose, Bld 100 (*) 70 - 99 mg/dL   BUN  51 (*) 6 - 23 mg/dL   Creatinine, Ser 8.29 (*) 0.50 - 1.35 mg/dL   Calcium 8.6  8.4 - 56.2 mg/dL   Phosphorus 4.7 (*) 2.3 - 4.6 mg/dL   Albumin 2.2 (*) 3.5 - 5.2 g/dL   GFR calc non Af Amer 20 (*) >90 mL/min   GFR calc Af Amer 23 (*) >90 mL/min  MAGNESIUM     Status: None   Collection Time    04/16/13  5:35 AM      Result Value Range   Magnesium 1.7  1.5 - 2.5 mg/dL  GLUCOSE, CAPILLARY     Status: None   Collection Time    04/16/13  6:03 AM      Result Value Range   Glucose-Capillary 96  70 - 99 mg/dL  GLUCOSE, CAPILLARY     Status: None   Collection Time    04/16/13 11:23 AM      Result Value Range   Glucose-Capillary 84  70 - 99 mg/dL   Comment 1 Notify RN    CLOSTRIDIUM DIFFICILE BY PCR     Status: None   Collection Time    04/16/13  2:38 PM      Result Value Range   C difficile by pcr NEGATIVE  NEGATIVE  GLUCOSE, CAPILLARY     Status: Abnormal   Collection Time    04/16/13  4:20 PM      Result Value Range   Glucose-Capillary 106 (*) 70 - 99 mg/dL   Comment 1  Notify RN    GLUCOSE, CAPILLARY     Status: None   Collection Time    04/16/13  9:28 PM      Result Value Range   Glucose-Capillary 91  70 - 99 mg/dL  BASIC METABOLIC PANEL     Status: Abnormal   Collection Time    04/17/13  4:05 AM      Result Value Range   Sodium 138  135 - 145 mEq/L   Potassium 3.9  3.5 - 5.1 mEq/L   Chloride 97  96 - 112 mEq/L   CO2 28  19 - 32 mEq/L   Glucose, Bld 133 (*) 70 - 99 mg/dL   BUN 51 (*) 6 - 23 mg/dL   Creatinine, Ser 1.30 (*) 0.50 - 1.35 mg/dL   Calcium 8.9  8.4 - 86.5 mg/dL   GFR calc non Af Amer 19 (*) >90 mL/min   GFR calc Af Amer 22 (*) >90 mL/min   Dg Hip Complete Left  04/16/2013   *RADIOLOGY REPORT*  Clinical Data: Fall.  Left hip and knee pain.  LEFT HIP - COMPLETE 2+ VIEW  Comparison: None.  Findings: Pelvic rings appear intact.  Right hip hemiarthroplasty. Atherosclerosis.  No displaced pelvic fracture.  Left hip chondrocalcinosis.  No left hip fracture.  No significant joint space narrowing.  IMPRESSION: No acute osseous abnormality.  Chondrocalcinosis of the left hip, which may be associated with CPPD arthropathy or senile.   Original Report Authenticated By: Andreas Newport, M.D.    Assessment/Plan: Diagnosis: deconditioning after multiple medical issues, recent L3 decompression 1. Does the need for close, 24 hr/day medical supervision in concert with the patient's rehab needs make it unreasonable for this patient to be served in a less intensive setting? Yes 2. Co-Morbidities requiring supervision/potential complications: htn, cad, hx of sirs 3. Due to bladder management, bowel management, safety, skin/wound care, disease management, medication administration, pain management and patient education, does the patient require 24 hr/day  rehab nursing? Yes 4. Does the patient require coordinated care of a physician, rehab nurse, PT (1-2 hrs/day, 5 days/week) and OT (1-2 hrs/day, 5 days/week) to address physical and functional deficits in the  context of the above medical diagnosis(es)? Yes Addressing deficits in the following areas: balance, endurance, locomotion, strength, transferring, bowel/bladder control, bathing, dressing, feeding, grooming, toileting and psychosocial support 5. Can the patient actively participate in an intensive therapy program of at least 3 hrs of therapy per day at least 5 days per week? Yes 6. The potential for patient to make measurable gains while on inpatient rehab is excellent 7. Anticipated functional outcomes upon discharge from inpatient rehab are mod I with PT, mod I to supervision with OT, n/a with SLP. 8. Estimated rehab length of stay to reach the above functional goals is: 10-14 days 9. Does the patient have adequate social supports to accommodate these discharge functional goals? Yes 10. Anticipated D/C setting: Home 11. Anticipated post D/C treatments: HH therapy 12. Overall Rehab/Functional Prognosis: excellent  RECOMMENDATIONS: This patient's condition is appropriate for continued rehabilitative care in the following setting: CIR Patient has agreed to participate in recommended program. Yes Note that insurance prior authorization may be required for reimbursement for recommended care.  Comment:Rehab RN to follow up.   Ranelle Oyster, MD, [place] [facility]     04/17/2013

## 2013-04-17 NOTE — Progress Notes (Signed)
[facility] Progress Note    [name] NFA:[id number] DOB: 1946/10/09 DOA: 04/09/2013 PCP: [name], MD  Brief narrative: 67 year old male patient on chronic immunosuppression after renal transplant remotely. Had lumbar decompression surgery 2 weeks prior to admission. Patient's wife brought him to the ER because he was confused and short of breath. In the ER patient was febrile and he was noted to have right lower extremity erythematous changes that appear to be consistent with cellulitis. Patient had a prior history of left septic knee and had been followed by ID at that time. Patient was also hypoglycemic with a blood sugar of 50. 24 hours prior to presenting to the ER patient had been doing well since his surgery. He had been having right knee pain but denied any back pain at the surgical site and had not had any drainage from the surgical site. Chest x-ray showed possible central vascular congestion - the patient had mildly elevated troponin. He also had a paced rhythm with a bundle branch block. He also appeared to be in acute renal failure. Cardiology was consulted and an echocardiogram was done, which showed large pericardial effusion without tamponade and in acute in chronic systolic CHF. Plan was to do pericardial window, the next but renal consult was requested and recommended diuresis with IV lasix.he did not undergo pericardial window and plan was to appropriately diuresis him and plan for echo in one week and do pericardial window if effusion is greater.  He underwent an echocardiogram on 6/3 and waiting results. He was also being followed neuro surgery for his back surgery 2 weeks ago. Dr Gerlene Fee on board. Patient's cellulitis improved and his antibiotics were discontinued. But he continued to have left hip pain, Dr Gerlene Fee was requested to see pt, and pt underwent CT myelogram of the lumbar spine, showing impression on the lateral aspect of teh thecal sac at L3 L4. Marland Kitchen Meanwhile from  the time he was started on colchicine he started having loose BM's. Colchicine was stopped. Stool for c diff pcr came back negative and stool culture and GI pathogen pcr is sent as he is immunocompromised from the renal transplant. Zosyn was stopped which might be contributing to the diarrhea and he was put on imodium. Currently awaiting echo results and plan for surgery for the abnormal CT myelogram and for cardiology to clear him for OR in the next few days.   Assessment/Plan:    SIRS (systemic inflammatory response syndrome) -likely source RLE cellulitis -CT lumbar spine ? possible abscess (epidural air fluid collection) but no gen inflammatory changes - not felt to be infectious changes per Neurosurgery     Cellulitis of right lower extremity -CT knee without osteo but MRI preferred imaging and can't obtain due to pacemaker -tiny joint effusion and SQ edema but no focal fluid collection -clinically improving, antibitoics stopped .     Renal failure (ARF), acute on chronic kidney disease stage 3 -nephrology clarified with pt's primary nephrologist that baseline Scr is 1.8 -Volume overloaded - PO Lasix started 5/28 and changed to IV dosing 5/29 -Scr rising but needs add'l diuresis so Renal increased Lasix dose to 100 mg TID 5/30 - expect with diuresis Scr should decrease -appreciate Renal assistance, currently holding lasix for elevated creatinine.     Demand ischemia/CAD (coronary artery disease) -subsequent enzymes have been normal and no CP -cont Coreg and ASA    Systolic Heart Failure/EF 30-35%/Grade 2 Diastolic Heart Failure -baseline EF 25% in March 2013 after PEA arrest requiring  PPM/ICD -cont coreg-doubt can use ACE with CKD and h/o renal transplant -Cards following      Large Pericardial Effusion -cards managing - seen on ECHO this admit -started on Colchicine per Cards, but was giving him diarrhea, hence it was stopped.  -repeat ECHO 5/29 stable and plan is to repeat echo  today, results pending.  - still may require window     Anemia of CKD/severe iron deficiency/B 12 def -IV iron this admit -hgb stable -was on oral B12 pre admit- check level here- may not be absorbing thru gut and may need injectable replacement    Positive D dimer -VQ scan with VERY low probability for PE     History of renal transplant -cont Prednisone and Cellcept for now -Nephrology following     Acute respiratory failure with hypoxia   -improved - oxygen weaned to RA     Exopthalmus -TSH 0.795    PVD -had purple discolored areas to left great toe and bases of toes left foot and foot cool to touch 5/27 - 5/28 edema and pallor/cooleness resolved -doppler pulse present -pt endorsed vascular studies 4 months ago at [facility] -follow    HTN (hypertension) -BP controlled  -cont home meds    Diabetes mellitus type 2, controlled/hypoglycemia -Levemir at home on hold -CBG much improved at this time    Hx of decompressive lumbar laminectomy (L-3) May12, 2014 -?of epidural abscess as per CT scan of spine -pain better after adjustments in narcs 5/28 -formally consulted Dr.Kritzer - he has reviewed the CT lumbar spine and does not feel changes are c/w infection -PT/OT recommending CIR when medically stable  History of atrial fib -occurred March 2013- pt refused anti coagulation  RUE edema -follow- had IV in same arm and if edema persists or worsens despite diuiresis may need venous duplex to r/o DVT -currently decreasing with elevation and diuresis   Diarrhea: possibly related to colchicine and antibiotics. c diff pcr negative. Colchicine stopped, zosyn stopped. On imodium and stool studies pending.   DVT prophylaxis:  Lovenox   Code Status: Full Family Communication: Patient Disposition Plan: pending CIR. Isolation: None  Consultants: Dr. Gerlene Fee with Neurosurgery Cardiology Nephrology  Procedures: 2-D echocardiogram  - Left ventricle: The cavity size was  moderately dilated. There was mild focal basal hypertrophy of the septum. Systolic function was moderately to severely reduced. The estimated ejection fraction was in the range of 30% to 35%. There is akinesis of the inferior posterior myocardium. There is hypokinesis of the lateral myocardium. Features are consistent with a pseudonormal left ventricular filling pattern, with concomitant abnormal relaxation and increased filling pressure (grade 2 diastolic dysfunction). - Aortic valve: Trivial regurgitation. - Mitral valve: Moderate regurgitation. - Left atrium: The atrium was moderately dilated. - Right ventricle: The cavity size was mildly dilated. Systolic function was severely reduced. - Right atrium: The atrium was mildly dilated. - Pulmonary arteries: PA peak pressure: 39mm Hg (S). - Pericardium, extracardiac: A large pericardial effusion was identified. Impressions: - Inferoposterior akinesis and lateral hypokinesis with moderate to severe reduction in LV function; MR difficult to quantitate due to eccentric nature of jet but probably moderate; RAE/RVE with severely reduced RV function; large pericardial effusion with mild RA collapse; no RV diastolic collapse; IVC mildly dilated with blunted response to inspiration.  Lower extremity venous duplex  negative for DVT  Antibiotics: Zosyn 5/26 >>>6/2 Vancomycin 5/26 >>> 5/30  HPI/Subjective: Patient is comfortable. Reports hip pain was worse today during the ct  Objective: Blood  pressure 142/78, pulse 84, temperature 98.3 F (36.8 C), temperature source Oral, resp. rate 16, height 6\' 1"  (1.854 m), weight 79.2 kg (174 lb 9.7 oz), SpO2 96.00%.  Intake/Output Summary (Last 24 hours) at 04/17/13 1752 Last data filed at 04/17/13 1606  Gross per 24 hour  Intake    170 ml  Output   2551 ml  Net  -2381 ml    Exam: General: No acute respiratory distress Lungs: Clear to auscultation bilaterally without wheezes or crackles,  RA Cardiovascular: Regular rate and ventricular paced rhythm without murmur gallop or rub normal S1 and S2, no JVD but does have 2+ pitting edema of right foot Abdomen: Nontender, nondistended, soft, bowel sounds positive, no rebound, no ascites, no appreciable mass Musculoskeletal: No significant cyanosis, clubbing of bilateral lower extremities Neurological: Alert and oriented x 3, moves all extremities x 4 without focal neurological deficits, CN 2-12 intact Integumentary: Patient has diffuse papular scaly lesions throughout entire body that he reports has been present for greater than 20 years; has decreased erythematous skin changes on the right lower extremity extending from the foot to below the knee - resolved erythematous changes on the left lower extremity mainly involving the calf  Scheduled Meds: Scheduled Meds: . antiseptic oral rinse  15 mL Mouth Rinse BID  . aspirin EC  325 mg Oral Daily  . atorvastatin  40 mg Oral q1800  . carvedilol  12.5 mg Oral BID WC  . darbepoetin (ARANESP) injection - NON-DIALYSIS  100 mcg Subcutaneous Q Thu-1800  . enoxaparin (LOVENOX) injection  30 mg Subcutaneous Q24H  . famotidine  20 mg Oral Daily  . [START ON 04/18/2013] furosemide  80 mg Oral Daily  . hydrocerin   Topical BID  . insulin aspart  0-5 Units Subcutaneous QHS  . insulin aspart  0-9 Units Subcutaneous TID WC  . insulin detemir  10 Units Subcutaneous Daily  . mycophenolate  500 mg Oral BID  . omega-3 acid ethyl esters  1 g Oral Daily  . predniSONE  5 mg Oral BID  . pyridOXINE  200 mg Oral Daily  . sodium chloride  3 mL Intravenous Q12H  . vitamin B-12  250 mcg Oral Daily  . vitamin C  500 mg Oral Daily   Data Reviewed: Basic Metabolic Panel:  Recent Labs Lab 04/11/13 0430 04/12/13 0355 04/13/13 0543 04/14/13 0435 04/16/13 0535 04/17/13 0405  NA 135 136 135 138 139 138  K 4.3 4.6 4.4 3.7 4.1 3.9  CL 103 101 100 100 97 97  CO2 19 21 21 23 26 28   GLUCOSE 208* 119* 165* 142*  100* 133*  BUN 58* 60* 62* 61* 51* 51*  CREATININE 2.38* 2.77* 2.93* 3.00* 3.07* 3.18*  CALCIUM 7.9* 8.6 8.8 8.7 8.6 8.9  MG  --   --  1.8 1.7 1.7  --   PHOS  --  5.2* 5.6* 5.2* 4.7*  --    Liver Function Tests:  Recent Labs Lab 04/12/13 0355 04/13/13 0543 04/14/13 0435 04/16/13 0535  AST  --  24  --   --   ALT  --  22  --   --   ALKPHOS  --  83  --   --   BILITOT  --  0.2*  --   --   PROT  --  4.9*  --   --   ALBUMIN 1.9* 1.9* 2.1* 2.2*   CBC:  Recent Labs Lab 04/11/13 0430 04/12/13 0355 04/13/13 0543 04/14/13 0435 04/15/13  1015  WBC 10.4 9.8 8.0 5.5 7.2  NEUTROABS  --   --   --   --  5.9  HGB 8.5* 8.2* 8.2* 8.4* 8.9*  HCT 27.8* 26.4* 26.2* 26.5* 27.9*  MCV 94.9 95.7 93.2 92.0 92.4  PLT 106* 85* 96* 114* 122*   Cardiac Enzymes: No results found for this basename: CKTOTAL, CKMB, CKMBINDEX, TROPONINI,  in the last 168 hours BNP (last 3 results)  Recent Labs  04/09/13 1856  PROBNP 21940.0*   CBG:  Recent Labs Lab 04/16/13 1620 04/16/13 2128 04/17/13 0617 04/17/13 1133 04/17/13 1605  GLUCAP 106* 91 136* 116* 115*    Recent Results (from the past 240 hour(s))  CULTURE, BLOOD (ROUTINE X 2)     Status: None   Collection Time    04/09/13  6:45 PM      Result Value Range Status   Specimen Description BLOOD RIGHT ARM   Final   Special Requests BOTTLES DRAWN AEROBIC ONLY [facility]   Final   Culture  Setup Time 04/10/2013 01:04   Final   Culture NO GROWTH 5 DAYS   Final   Report Status 04/16/2013 FINAL   Final  CULTURE, BLOOD (ROUTINE X 2)     Status: None   Collection Time    04/09/13  6:50 PM      Result Value Range Status   Specimen Description BLOOD RIGHT WRIST   Final   Special Requests BOTTLES DRAWN AEROBIC ONLY 2CC   Final   Culture  Setup Time 04/10/2013 01:04   Final   Culture NO GROWTH 5 DAYS   Final   Report Status 04/16/2013 FINAL   Final  URINE CULTURE     Status: None   Collection Time    04/09/13  7:44 PM      Result Value Range Status    Specimen Description URINE, CATHETERIZED   Final   Special Requests NONE   Final   Culture  Setup Time 04/09/2013 20:44   Final   Colony Count NO GROWTH   Final   Culture NO GROWTH   Final   Report Status 04/10/2013 FINAL   Final  MRSA PCR SCREENING     Status: None   Collection Time    04/10/13 12:04 AM      Result Value Range Status   MRSA by PCR NEGATIVE  NEGATIVE Final   Comment:            The GeneXpert MRSA Assay (FDA     approved for NASAL specimens     only), is one component of a     comprehensive MRSA colonization     surveillance program. It is not     intended to diagnose MRSA     infection nor to guide or     monitor treatment for     MRSA infections.  CLOSTRIDIUM DIFFICILE BY PCR     Status: None   Collection Time    04/16/13  2:38 PM      Result Value Range Status   C difficile by pcr NEGATIVE  NEGATIVE Final  STOOL CULTURE     Status: None   Collection Time    04/16/13  2:38 PM      Result Value Range Status   Specimen Description STOOL   Final   Special Requests NONE   Final   Culture Culture reincubated for better growth   Final   Report Status PENDING   Incomplete    Amenda Duclos  Blake Divine MD [phone]  On-Call/Text Page:      [email or web address]      password [email or web address]  If 7PM-7AM, please contact night-coverage [email or web address] Password [email or web address] 04/17/2013, 5:52 PM   LOS: 8 days

## 2013-04-17 NOTE — Progress Notes (Signed)
  Echocardiogram 2D Echocardiogram has been performed.  Georgian Co 04/17/2013, 3:33 PM

## 2013-04-17 NOTE — Progress Notes (Signed)
Patient ID: [name], male   DOB: 09-Apr-1946, 67 y.o.   MRN: [id number] Subjective: Patient reports left hip/thigh pain  Objective: Vital signs in last 24 hours: Temp:  [98.2 F (36.8 C)-98.4 F (36.9 C)] 98.2 F (36.8 C) (06/03 0624) Pulse Rate:  [76-100] 85 (06/03 0624) Resp:  [18-20] 18 (06/03 0624) BP: (163-170)/(80-99) 163/99 mmHg (06/03 0624) SpO2:  [95 %-100 %] 98 % (06/03 0624) Weight:  [79.2 kg (174 lb 9.7 oz)-89.631 kg (197 lb 9.6 oz)] 79.2 kg (174 lb 9.7 oz) (06/03 0730)  Intake/Output from previous day: 06/02 0701 - 06/03 0700 In: 490 [P.O.:240; IV Piggyback:250] Out: 2280 [Urine:2280] Intake/Output this shift: Total I/O In: 120 [P.O.:120] Out: 1 [Stool:1]  Wound:clean and dry  Lab Results:  Recent Labs  04/15/13 1015  WBC 7.2  HGB 8.9*  HCT 27.9*  PLT 122*   BMET  Recent Labs  04/16/13 0535 04/17/13 0405  NA 139 138  K 4.1 3.9  CL 97 97  CO2 26 28  GLUCOSE 100* 133*  BUN 51* 51*  CREATININE 3.07* 3.18*  CALCIUM 8.6 8.9    Studies/Results: Dg Hip Complete Left  04/16/2013   *RADIOLOGY REPORT*  Clinical Data: Fall.  Left hip and knee pain.  LEFT HIP - COMPLETE 2+ VIEW  Comparison: None.  Findings: Pelvic rings appear intact.  Right hip hemiarthroplasty. Atherosclerosis.  No displaced pelvic fracture.  Left hip chondrocalcinosis.  No left hip fracture.  No significant joint space narrowing.  IMPRESSION: No acute osseous abnormality.  Chondrocalcinosis of the left hip, which may be associated with CPPD arthropathy or senile.   Original Report Authenticated By: Andreas Newport, M.D.    Assessment/Plan: Doing fair. Thought he was getting better over weekend, but he continues to complain of fairly severe pain. Have discussed the options. I think a diagnostic iamging test is needed. I am suggesting an intrathecal dye enhanced CT scan. He agrees, and I will arrange that test for next few days.   LOS: 8 days  as above   Reinaldo Meeker,  MD 04/17/2013, 9:51 AM

## 2013-04-17 NOTE — Procedures (Signed)
Prior images reviewed.  Discussed with Dr. Lewis Moccasin ability to use intrathecal contrast.  Discussed with Dr. Gerlene Fee, need for exam.  Discussed procedure and risks with patient.  Questions answered.   Informed consent obtained.  L2-3 LP with single pass 22g spinal needele.  Clear CSF return.  8 cc Omnipaque 180 instilled.  Myelogram performed.  CT to follow.

## 2013-04-17 NOTE — Progress Notes (Addendum)
Patient ID: [name], male   DOB: 1946-05-18, 67 y.o.   MRN: [id number] S:still c/o intermittent Nausea and Diarrhea O:BP 163/99  Pulse 85  Temp(Src) 98.2 F (36.8 C) (Oral)  Resp 18  Ht 6\' 1"  (1.854 m)  Wt 79.2 kg (174 lb 9.7 oz)  BMI 23.04 kg/m2  SpO2 98%  Intake/Output Summary (Last 24 hours) at 04/17/13 1044 Last data filed at 04/17/13 0827  Gross per 24 hour  Intake    610 ml  Output   2081 ml  Net  -1471 ml   Intake/Output: I/O last 3 completed shifts: In: 610 [P.O.:360; IV Piggyback:250] Out: 3258 [Urine:3255; Stool:3]  Intake/Output this shift:  Total I/O In: 120 [P.O.:120] Out: 1 [Stool:1] Weight change: 8.528 kg (18 lb 12.8 oz) Gen:WD WN WM in NAD CVS:no rub Resp:cta Abd:+BS, soft, mildly tender to deep palpation Ext:1+pretib edema   Recent Labs Lab 04/11/13 0430 04/12/13 0355 04/13/13 0543 04/14/13 0435 04/16/13 0535 04/17/13 0405  NA 135 136 135 138 139 138  K 4.3 4.6 4.4 3.7 4.1 3.9  CL 103 101 100 100 97 97  CO2 19 21 21 23 26 28   GLUCOSE 208* 119* 165* 142* 100* 133*  BUN 58* 60* 62* 61* 51* 51*  CREATININE 2.38* 2.77* 2.93* 3.00* 3.07* 3.18*  ALBUMIN  --  1.9* 1.9* 2.1* 2.2*  --   CALCIUM 7.9* 8.6 8.8 8.7 8.6 8.9  PHOS  --  5.2* 5.6* 5.2* 4.7*  --   AST  --   --  24  --   --   --   ALT  --   --  22  --   --   --    Liver Function Tests:  Recent Labs Lab 04/13/13 0543 04/14/13 0435 04/16/13 0535  AST 24  --   --   ALT 22  --   --   ALKPHOS 83  --   --   BILITOT 0.2*  --   --   PROT 4.9*  --   --   ALBUMIN 1.9* 2.1* 2.2*   No results found for this basename: LIPASE, AMYLASE,  in the last 168 hours No results found for this basename: AMMONIA,  in the last 168 hours CBC:  Recent Labs Lab 04/11/13 0430 04/12/13 0355 04/13/13 0543 04/14/13 0435 04/15/13 1015  WBC 10.4 9.8 8.0 5.5 7.2  NEUTROABS  --   --   --   --  5.9  HGB 8.5* 8.2* 8.2* 8.4* 8.9*  HCT 27.8* 26.4* 26.2* 26.5* 27.9*  MCV 94.9 95.7 93.2 92.0 92.4  PLT  106* 85* 96* 114* 122*   Cardiac Enzymes: No results found for this basename: CKTOTAL, CKMB, CKMBINDEX, TROPONINI,  in the last 168 hours CBG:  Recent Labs Lab 04/16/13 0603 04/16/13 1123 04/16/13 1620 04/16/13 2128 04/17/13 0617  GLUCAP 96 84 106* 91 136*    Iron Studies: No results found for this basename: IRON, TIBC, TRANSFERRIN, FERRITIN,  in the last 72 hours Studies/Results: Dg Hip Complete Left  04/16/2013   *RADIOLOGY REPORT*  Clinical Data: Fall.  Left hip and knee pain.  LEFT HIP - COMPLETE 2+ VIEW  Comparison: None.  Findings: Pelvic rings appear intact.  Right hip hemiarthroplasty. Atherosclerosis.  No displaced pelvic fracture.  Left hip chondrocalcinosis.  No left hip fracture.  No significant joint space narrowing.  IMPRESSION: No acute osseous abnormality.  Chondrocalcinosis of the left hip, which may be associated with CPPD arthropathy or senile.  Original Report Authenticated By: Andreas Newport, M.D.   . antiseptic oral rinse  15 mL Mouth Rinse BID  . aspirin EC  325 mg Oral Daily  . atorvastatin  40 mg Oral q1800  . carvedilol  12.5 mg Oral BID WC  . darbepoetin (ARANESP) injection - NON-DIALYSIS  100 mcg Subcutaneous Q Thu-1800  . enoxaparin (LOVENOX) injection  30 mg Subcutaneous Q24H  . famotidine  20 mg Oral Daily  . furosemide  80 mg Oral BID  . hydrocerin   Topical BID  . insulin aspart  0-5 Units Subcutaneous QHS  . insulin aspart  0-9 Units Subcutaneous TID WC  . insulin detemir  10 Units Subcutaneous Daily  . mycophenolate  500 mg Oral BID  . omega-3 acid ethyl esters  1 g Oral Daily  . piperacillin-tazobactam (ZOSYN)  IV  3.375 g Intravenous Q8H  . predniSONE  5 mg Oral BID  . pyridOXINE  200 mg Oral Daily  . sodium chloride  3 mL Intravenous Q12H  . vitamin B-12  250 mcg Oral Daily  . vitamin C  500 mg Oral Daily    BMET    Component Value Date/Time   NA 138 04/17/2013 0405   K 3.9 04/17/2013 0405   CL 97 04/17/2013 0405   CO2 28 04/17/2013 0405    GLUCOSE 133* 04/17/2013 0405   BUN 51* 04/17/2013 0405   CREATININE 3.18* 04/17/2013 0405   CALCIUM 8.9 04/17/2013 0405   GFRNONAA 19* 04/17/2013 0405   GFRAA 22* 04/17/2013 0405   CBC    Component Value Date/Time   WBC 7.2 04/15/2013 1015   RBC 3.02* 04/15/2013 1015   HGB 8.9* 04/15/2013 1015   HCT 27.9* 04/15/2013 1015   PLT 122* 04/15/2013 1015   MCV 92.4 04/15/2013 1015   MCH 29.5 04/15/2013 1015   MCHC 31.9 04/15/2013 1015   RDW 16.6* 04/15/2013 1015   LYMPHSABS 0.9 04/15/2013 1015   MONOABS 0.4 04/15/2013 1015   EOSABS 0.1 04/15/2013 1015   BASOSABS 0.0 04/15/2013 1015     Assessment/Plan:  1. 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 AKI likely related to aggressive diuresis and ongoing diarrhea. Switched over to po lasix and continues with significant diuresis.  Will hold pm dose of lasix today and follow.  2. CHF- markedly improved volume status. EF 30-35%. On lower dose of po lasix (will hold pm dose today due to rise in Scr). Negative ~10kg since admission 3. Pericardial effusion without tamponade. Agree with repeat ECHO 4. DM- stable 5. Diarrhea/Nausea- may benefit from immodium or lomotil. Likely related to ongoing abx. Consider probiotics.  6. H/o Vfib arrest, s/p AICD 7. Immunosuppressive therapy- cont with MMF and pred 8. Cellulitis- on abx but would renal dose at 2.75gm q8 and consider narrowing coverage as this is likely making diarrhea worse.  Has been on abx for 7 days and markedly improved. Will d/c zosyn now. 9. Anemia- normocytic. May benefit from ESA. Will cont to follow 10.   [name]

## 2013-04-17 NOTE — Progress Notes (Signed)
I await medical workup completion and then will pursue insurance approval for a possible inpt rehab admission. [phone]

## 2013-04-18 DIAGNOSIS — I10 Essential (primary) hypertension: Secondary | ICD-10-CM

## 2013-04-18 DIAGNOSIS — L03119 Cellulitis of unspecified part of limb: Secondary | ICD-10-CM

## 2013-04-18 LAB — CBC
MCH: 28.8 pg (ref 26.0–34.0)
MCHC: 30.8 g/dL (ref 30.0–36.0)
MCV: 93.5 fL (ref 78.0–100.0)
Platelets: 172 10*3/uL (ref 150–400)
RDW: 17.7 % — ABNORMAL HIGH (ref 11.5–15.5)

## 2013-04-18 LAB — GLUCOSE, CAPILLARY: Glucose-Capillary: 134 mg/dL — ABNORMAL HIGH (ref 70–99)

## 2013-04-18 LAB — BASIC METABOLIC PANEL
CO2: 27 mEq/L (ref 19–32)
Calcium: 8.9 mg/dL (ref 8.4–10.5)
Creatinine, Ser: 3.05 mg/dL — ABNORMAL HIGH (ref 0.50–1.35)
GFR calc non Af Amer: 20 mL/min — ABNORMAL LOW (ref >90)
Glucose, Bld: 142 mg/dL — ABNORMAL HIGH (ref 70–99)

## 2013-04-18 NOTE — Progress Notes (Addendum)
Physical Therapy Treatment and D/C Patient Details Name: [name] [name] MRN: [id number] DOB: 07/10/1946 Today's Date: 04/18/2013 Time: 1100-1109 PT Time Calculation (min): 9 min  PT Assessment / Plan / Recommendation Comments on Treatment Session  Pt shared with PT that he is upset as he needs another back surgery and is getting a second opinion.  He states the doctor told him to not ambulate right now.  Performed light exercise.  Will sign off.  MD:  please reorder PT after surgery.                                        Plan Other (comment) (Will need reorder after surgery.)    Precautions / Restrictions Precautions Precautions: Back;Fall Restrictions Weight Bearing Restrictions: No   Pertinent Vitals/Pain VSS, left hip pain    Mobility  Bed Mobility Bed Mobility: Not assessed Transfers Transfers: Not assessed Ambulation/Gait Ambulation/Gait Assistance: Not tested (comment) Stairs: No Wheelchair Mobility Wheelchair Mobility: No    Exercises General Exercises - Lower Extremity Ankle Circles/Pumps: AROM;Both;10 reps;Supine Quad Sets: AROM;Both;10 reps;Seated Long Arc Quad: AROM;Both;10 reps;Seated    PT Goals  N/A  Visit Information  Last PT Received On: 04/18/13 Assistance Needed: +1    Subjective Data  Subjective: "I just don't want to do therapy until after my surgery.  I think that is what the doctor wants."   Cognition  Cognition Arousal/Alertness: Awake/alert Behavior During Therapy: WFL for tasks assessed/performed Overall Cognitive Status: Within Functional Limits for tasks assessed    Balance  Static Sitting Balance Static Sitting - Level of Assistance: Not tested (comment)  End of Session PT - End of Session Activity Tolerance: Other (comment) (Limited due to pt states MD holding ambulation) Patient left: in chair;with call bell/phone within reach Nurse Communication: Mobility status        LoganLogan [name] 04/18/2013, 12:41 PM [facility] [phone] (pager)

## 2013-04-18 NOTE — Progress Notes (Signed)
I met with patient at bedside. He is agreeable to inpt rehab admission once surgical plan is complete. [phone]

## 2013-04-18 NOTE — Progress Notes (Signed)
Subjective:  The patient denies any chest discomfort or shortness of breath.  Vital signs have remained stable.  His weight is stable at 173 pounds.  Objective:  Vital Signs in the last 24 hours: Temp:  [98.3 F (36.8 C)-98.6 F (37 C)] 98.6 F (37 C) (06/04 0413) Pulse Rate:  [80-90] 88 (06/04 0413) Resp:  [16-18] 17 (06/04 0413) BP: (140-167)/(67-88) 152/80 mmHg (06/04 0647) SpO2:  [94 %-96 %] 94 % (06/04 0413) Weight:  [173 lb 15.1 oz (78.9 kg)] 173 lb 15.1 oz (78.9 kg) (06/04 0413)  Intake/Output from previous day: 06/03 0701 - 06/04 0700 In: 360 [P.O.:360] Out: 2951 [Urine:2950; Stool:1] Intake/Output from this shift:    . antiseptic oral rinse  15 mL Mouth Rinse BID  . aspirin EC  325 mg Oral Daily  . atorvastatin  40 mg Oral q1800  . carvedilol  12.5 mg Oral BID WC  . darbepoetin (ARANESP) injection - NON-DIALYSIS  100 mcg Subcutaneous Q Thu-1800  . enoxaparin (LOVENOX) injection  30 mg Subcutaneous Q24H  . famotidine  20 mg Oral Daily  . furosemide  80 mg Oral Daily  . hydrocerin   Topical BID  . insulin aspart  0-5 Units Subcutaneous QHS  . insulin aspart  0-9 Units Subcutaneous TID WC  . insulin detemir  10 Units Subcutaneous Daily  . mycophenolate  500 mg Oral BID  . omega-3 acid ethyl esters  1 g Oral Daily  . predniSONE  5 mg Oral BID  . pyridOXINE  200 mg Oral Daily  . sodium chloride  3 mL Intravenous Q12H  . vitamin B-12  250 mcg Oral Daily  . vitamin C  500 mg Oral Daily      Physical Exam: The patient appears to be in no distress.  Head and neck exam reveals that the pupils are equal and reactive.  His eyes are somewhat proptotic but thyroid function is normal. The extraocular movements are full.  There is no scleral icterus.  Mouth and pharynx are benign.  No lymphadenopathy.  No carotid bruits.  The jugular venous pressure is normal.  Thyroid is not enlarged or tender.  Chest is clear to percussion and auscultation.  No rales or rhonchi.   Expansion of the chest is symmetrical.  Heart reveals no abnormal lift or heave.  First and second heart sounds are normal.  There is no  gallop rub or click.  There is a grade 2/6 apical regurgitant murmur.  The abdomen is soft and nontender.  Bowel sounds are normoactive.  There is no hepatosplenomegaly or mass.  There are no abdominal bruits.  Extremities reveal mild peripheral edema.  Integument reveals mild lower extremity erythema  Lab Results:  Recent Labs  04/15/13 1015 04/18/13 0505  WBC 7.2 9.7  HGB 8.9* 9.3*  PLT 122* 172    Recent Labs  04/17/13 0405 04/18/13 0505  NA 138 140  K 3.9 3.7  CL 97 100  CO2 28 27  GLUCOSE 133* 142*  BUN 51* 45*  CREATININE 3.18* 3.05*   No results found for this basename: TROPONINI, CK, MB,  in the last 72 hours Hepatic Function Panel  Recent Labs  04/16/13 0535  ALBUMIN 2.2*   No results found for this basename: CHOL,  in the last 72 hours No results found for this basename: PROTIME,  in the last 72 hours  Imaging: Imaging results have been reviewed. Two-dimensional echocardiogram on 04/17/13: Impressions:  - There is a large circumferential pericardial  effusion. My impression is that there may be slight decrease in size of the effusion since 04/12/2013. However, this is only an imprression.  Cardiac Studies: Telemetry shows paced ventricular rhythm.  No P waves seen. Assessment/Plan:  1. Large pericardial effusion.  No evidence of tamponade.  Echo yesterday unchanged or slightly smaller pericardial effusion. 2. Acute on chronic systolic congestive heart failure  3. Biventricular ICD placement  4. Chronic kidney disease with previous transplant  5. Diarrhea improving. 6. Nausea and indigestion, improved 7. left hip and left thigh pain.  May need additional neurosurgery.  Plan: The patient is stable from the cardiac standpoint at this point if further back surgery is necessary.   LOS: 9 days    Cassell Clement 04/18/2013, 8:14 AM

## 2013-04-18 NOTE — Progress Notes (Signed)
Patient ID: [name]  male  WUJ:[id number]    DOB: November 12, 1946    DOA: 04/09/2013  PCP: [name], MD  Assessment/Plan: Principal Problem: SIRS (systemic inflammatory response syndrome) - resolved    Cellulitis of right lower extremity- improving    Renal failure (ARF), acute on chronic kidney disease stage 3  - Renal service following, on oral Lasix  Hx of decompressive lumbar laminectomy (L-3) May12, 2014 -?of epidural abscess as per CT scan of spine  -Neurosurgery following. ? 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  -IV iron this admit   Positive D dimer  -VQ scan with VERY low probability for PE   History of renal transplant  -cont Prednisone and Cellcept for now  -Nephrology following   Acute respiratory failure with hypoxia - resolved  PVD  - pt endorsed vascular studies 4 months ago at [facility]   HTN (hypertension)  -BP controlled   Diabetes mellitus type 2, controlled/hypoglycemia  -Levemir at home on hold, CBG much improved at this time   History of atrial fib  -occurred March 2013- pt refused anti coagulation   RUE edema  - had IV in same arm and if edema persists or worsens despite diuiresis may need venous duplex to r/o DVT  -currently decreasing with elevation and diuresis   Diarrhea: possibly related to colchicine and antibiotics. c diff pcr negative, stool studies neg  DVT Prophylaxis:  Code Status:  Disposition:  Inpatient rehabilitation when medically stable    Subjective: Patient frustrated with ongoing issues, especially needing another neurosurgery, happy that he is cleared by cardiology for the surgery  Objective: Weight change: -10.431 kg (-22 lb 15.9 oz)  Intake/Output Summary (Last 24 hours) at 04/18/13 1341 Last data filed at 04/18/13 1200  Gross per 24 hour  Intake    360 ml  Output   2575 ml  Net  -2215 ml   Blood pressure 152/80, pulse 88, temperature 98.6 F (37 C), temperature source Oral, resp. rate 17, height 6\' 1"  (1.854 m), weight 78.9 kg (173 lb 15.1 oz), SpO2 94.00%.  Physical Exam: General: Alert and awake, oriented x3, not in any acute distress. CVS: S1-S2 clear, no murmur rubs or gallops Chest: clear to auscultation bilaterally, no wheezing, rales or rhonchi Abdomen: soft nontender, nondistended, normal bowel sounds  Extremities: no cyanosis, clubbing , mild edema noted bilaterally Neuro: Cranial nerves II-XII intact, no focal neurological deficits  Lab Results: Basic Metabolic Panel:  Recent Labs Lab 04/16/13 0535 04/17/13 0405 04/18/13 0505  NA 139 138 140  K 4.1 3.9 3.7  CL 97 97 100  CO2 26 28 27   GLUCOSE 100* 133* 142*  BUN 51* 51* 45*  CREATININE 3.07* 3.18* 3.05*  CALCIUM 8.6 8.9 8.9  MG 1.7  --   --   PHOS 4.7*  --   --    Liver Function Tests:  Recent Labs Lab 04/13/13 0543 04/14/13 0435 04/16/13 0535  AST 24  --   --  ALT 22  --   --   ALKPHOS 83  --   --   BILITOT 0.2*  --   --   PROT 4.9*  --   --   ALBUMIN 1.9* 2.1* 2.2*   No results found for this basename: LIPASE, AMYLASE,  in the last 168 hours No results found for this basename: AMMONIA,  in the last 168 hours CBC:  Recent Labs Lab 04/15/13 1015 04/18/13 0505  WBC 7.2 9.7  NEUTROABS 5.9  --   HGB 8.9* 9.3*  HCT 27.9* 30.2*  MCV 92.4 93.5  PLT 122* 172   Cardiac Enzymes: No results found for this basename: CKTOTAL, CKMB, CKMBINDEX, TROPONINI,  in the last 168  hours BNP: No components found with this basename: POCBNP,  CBG:  Recent Labs Lab 04/17/13 1133 04/17/13 1605 04/17/13 2113 04/18/13 0615 04/18/13 1123  GLUCAP 116* 115* 156* 134* 126*     Micro Results: Recent Results (from the past 240 hour(s))  CULTURE, BLOOD (ROUTINE X 2)     Status: None   Collection Time    04/09/13  6:45 PM      Result Value Range Status   Specimen Description BLOOD RIGHT ARM   Final   Special Requests BOTTLES DRAWN AEROBIC ONLY [facility]   Final   Culture  Setup Time 04/10/2013 01:04   Final   Culture NO GROWTH 5 DAYS   Final   Report Status 04/16/2013 FINAL   Final  CULTURE, BLOOD (ROUTINE X 2)     Status: None   Collection Time    04/09/13  6:50 PM      Result Value Range Status   Specimen Description BLOOD RIGHT WRIST   Final   Special Requests BOTTLES DRAWN AEROBIC ONLY 2[facility]   Final   Culture  Setup Time 04/10/2013 01:04   Final   Culture NO GROWTH 5 DAYS   Final   Report Status 04/16/2013 FINAL   Final  URINE CULTURE     Status: None   Collection Time    04/09/13  7:44 PM      Result Value Range Status   Specimen Description URINE, CATHETERIZED   Final   Special Requests NONE   Final   Culture  Setup Time 04/09/2013 20:44   Final   Colony Count NO GROWTH   Final   Culture NO GROWTH   Final   Report Status 04/10/2013 FINAL   Final  MRSA PCR SCREENING     Status: None   Collection Time    04/10/13 12:04 AM      Result Value Range Status   MRSA by PCR NEGATIVE  NEGATIVE Final   Comment:            The GeneXpert MRSA Assay (FDA     approved for NASAL specimens     only), is one component of a     comprehensive MRSA colonization     surveillance program. It is not     intended to diagnose MRSA     infection nor to guide or     monitor treatment for     MRSA infections.  CLOSTRIDIUM DIFFICILE BY PCR     Status: None   Collection Time    04/16/13  2:38 PM      Result Value Range Status   C difficile by pcr NEGATIVE  NEGATIVE Final   STOOL CULTURE     Status: None   Collection Time    04/16/13  2:38 PM      Result Value Range Status   Specimen Description STOOL   Final   Special Requests NONE   Final   Culture     Final   Value: NO SUSPICIOUS COLONIES, CONTINUING TO HOLD     Note: REDUCED NORMAL FLORA PRESENT   Report Status PENDING   Incomplete    Studies/Results: Dg Chest 2 View  03/23/2013   *RADIOLOGY REPORT*  Clinical Data: Preop evaluation for lumbar spine surgery diabetes, hypertension, pacemaker  CHEST - 2 VIEW  Comparison: 07/27/2012  Findings: Cardiomegaly noted without CHF or pneumonia.  No effusion or pneumothorax.  Left subclavian pacer evident.  Previous right PICC line removed. Atherosclerotic calcifications of the aorta  IMPRESSION: Cardiomegaly without CHF or pneumonia   Original Report Authenticated By: Judie Petit. Miles Costain, M.D.   Dg Lumbar Spine 2-3 Views  03/26/2013   *RADIOLOGY REPORT*  Clinical Data: Lumbar laminectomy  LUMBAR SPINE - 2-3 VIEW  Comparison: 05/04 12/04/2012  Findings: The first intraoperative lumbar radiograph shows a needle projecting between the L2 and L3 spinous processes.  Second film demonstrates surgical instruments projecting posterior to the   L3- 4 interspace.  IMPRESSION:  Intraoperative localization   Original Report Authenticated By: D. Andria Rhein, MD   Dg Hip Complete Left  04/16/2013   *RADIOLOGY REPORT*  Clinical Data: Fall.  Left hip and knee pain.  LEFT HIP - COMPLETE 2+ VIEW  Comparison: None.  Findings: Pelvic rings appear intact.  Right hip hemiarthroplasty. Atherosclerosis.  No displaced pelvic fracture.  Left hip chondrocalcinosis.  No left hip fracture.  No significant joint space narrowing.  IMPRESSION: No acute osseous abnormality.  Chondrocalcinosis of the left hip, which may be associated with CPPD arthropathy or senile.   Original Report Authenticated By: Andreas Newport, M.D.   Ct Lumbar Spine Wo Contrast  04/10/2013   *RADIOLOGY REPORT*  Clinical Data: Confusion with  shortness of breath and fever following L3 decompression 03/26/2013.  Question osteomyelitis.  CT LUMBAR SPINE WITHOUT CONTRAST  Technique:  Multidetector CT imaging of the lumbar spine was performed without intravenous contrast administration. Multiplanar CT image reconstructions were also generated.  Comparison: Intraoperative radiographs 03/26/2013, lumbar myelogram CT 02/28/2013 and CT urogram 05/19/2008.  Findings: There are interval postsurgical changes at L3-L4 status post laminectomy and discectomy.  The spinal canal appears adequately decompressed posteriorly.  There is persistent annular disc bulging eccentric to the left with associated vacuum phenomenon.  There is persistent left foraminal stenosis and possible exiting L3 nerve root encroachment. Lesser foraminal narrowing is present on the right.  In addition, there is new inferior extension of high density and air within the anterior epidural space asymmetric to the left. This exerts mild mass effect on the thecal sac which is displaced posteriorly.  No anterior paraspinal inflammatory changes or fluid collections are identified.  There is no endplate destruction to suggest osteomyelitis.  Postsurgical changes are noted status post right renal transplant. 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  The native right kidney demonstrates moderate hydronephrosis, new from 2009 and suboptimally visualized on more recent studies. A calcified gallstone is again noted.  There is stable mild disc bulging at L1-L2 and L2-L3.  No spinal stenosis or nerve root encroachment results.  L3-L4: As above.  L4-L5:  Similar annular disc bulging with a small left paracentral disc protrusion and bilateral facet hypertrophy.  There is mild resulting left lateral recess stenosis.  No foraminal compromise or exiting  L4 nerve root encroachment is identified.  L5-S1:  Stable annular disc bulging, central disc protrusion and associated vacuum  phenomenon.  No sacral nerve root displacement or significant foraminal compromise is identified.  There is stable mild bilateral facet hypertrophy.  IMPRESSION:  1.  Interval postsurgical changes at L3-L4. An epidural air fluid collection extends inferiorly from the disc space behind the L4 vertebral body and exerts mass effect on the thecal sac.  As this appears contiguous with the L3-L4 disc which demonstrates vacuum phenomenon, this may reflect an extruded disc fragment.  I cannot exclude an epidural abscess, although no generalized inflammatory changes are identified. There is persistent foraminal narrowing at L3-L4, worse on the left. 2.  No CT evidence of osteomyelitis. 3.  Stable disc protrusions at L4-L5 and L5-S1. 4.  Apparent hydronephrosis of the native right renal collecting system of uncertain significance and etiology.   Original Report Authenticated By: Carey Bullocks, M.D.   Ct Lumbar Spine W Contrast  04/17/2013   *RADIOLOGY REPORT*  Clinical Data: Left leg pain post surgery.  LUMBAR MYELOGRAM AND POST MYELOGRAM CT  MYELOGRAM  LUMBAR  Technique: Order reviewed. As the patient has acute on top of chronic renal insufficiency, the need for the case was discussed with Dr. Gerlene Fee.  Dr. Randell Loop who is following the patient (nephrology service) agreed to low volume intrathecal contrast. Any available prior imaging reviewed.  The procedure and associated risks were reviewed with the patient (including but not limited to headache, bleeding, infection, contrast reaction, nondiagnostic study, nerve damage, all of which may require treatment).  All questions were answered.  Written and oral witnessed consent was obtained.  Time out performed.  Under sterile technique, a  lumbar puncture was performed with a L2- 3 pass of a 22g needle.  Clear CSF was noted. 8cc of Omnipaque 180 was instilled.   Lumbar myelogram fluoroscopic images were obtained.  Post procedure instructions were reviewed with the patient.   Fluoroscopy time:  48 seconds  Comparison: 04/11/2011 CT and preoperative post myelogram 03/16/2011 CT.  Findings: Dextroscoliosis lumbar spine.  L3-4 disc space narrowing greater on the left.  Impression upon the left lateral aspect of the thecal sac at the L3-4 level compressing the left L4 nerve root.  IMPRESSION: Dextroscoliosis lumbar spine.  L3-4 disc space narrowing greater on the left.  Impression upon the left lateral aspect of the thecal sac at the L3- 4 level compressing the left L4 nerve root.  Please see below for further detail.  CT MYELOGRAPHY LUMBAR SPINE  Technique: CT imaging of the lumbar spine was performed after intrathecal contrast administration.  Multiplanar CT image reconstructions were also generated.  Findings:  Last fully open disc space is labeled L5-S1.  Present examination incorporates from T12-L1 through the mid sacrum.  Atrophic kidneys more notable on the left.  Arising from the lateral aspect of the left kidney is a 3.2 cm mass which has fatty components.  On the 2009 CT, this measured 5.4 cm (appearing more fatty on the remote exam) and possibly represents treated mass or involuting angiomyolipoma. Clinical correlation recommended.  This is incompletely assessed on present exam.  Hydronephrotic right kidney.  Prominent atherosclerotic type changes of the aorta.  Small bulge L3 level measures 2.4 cm whereas just above this level aorta measures 2.1 cm.  Narrowing of the renal arteries and less so involving the iliac arteries, superior mesenteric and celiac artery.  Conus L1 level.  Dextroscoliosis lumbar spine with disc space narrowing most notable  on the left at the L3-4 level.  T12-L1:  Negative.  L1-2:  Negative.  L2-3:  Minimal facet joint degenerative changes.  L3-4:  Prominent disc degeneration with disc space narrowing more notable on the left.  Recent laminectomy/partial facetectomy. Facet joint degenerative changes.  Broad-based calcified disc protrusion extending into the  neural foramen with prominent bilateral foraminal narrowing and mass effect upon the exiting L3 nerve roots greater on the left. Postsurgical changes surrounding narrowed thecal sac further contributing to moderate to marked thecal sac narrowing.  Additionally, extending caudally from the left posterior lateral aspect of the disc space is a large soft tissue and air containing collection which may represent extruded disc material reaching the L4-5 disc space and causing mass effect upon the thecal sac and left L4 nerve roots.  Epidural abscess not entirely excluded in the proper clinical setting although a secondary consideration.  This has increased in size since the prior postoperative examination.  L4-5:  Facet joint degenerative changes.  Prior left hemilaminectomy.  Bulge/broad-based protrusion.  Facet joint degenerative changes.  Ligamentum flavum hypertrophy.  Mild to moderate left-sided and mild right-sided lateral recess stenosis. Mild spinal stenosis.  Mild bilateral foraminal narrowing.  L5-S1:  Facet joint degenerative changes.  Disc degeneration with broad-based slightly caudally extending disc protrusion which touches but does not cause significant mass effect upon the S1 nerve roots or thecal sac.  Lateral extension greater to the right with mild encroaching upon the exiting right L5 nerve root.  IMPRESSION: L3-4 recent laminectomy/partial facetectomy. Broad-based calcified disc protrusion extending into the neural foramen with prominent bilateral foraminal narrowing and mass effect upon the exiting L3 nerve roots greater on the left. Postsurgical changes surrounding narrowed thecal sac further contributing to moderate to marked thecal sac narrowing.  Additionally, extending caudally from the left posterior lateral aspect of the disc space is a large soft tissue and air containing collection which may represent extruded disc material reaching the L4-5 disc space and causing mass effect upon the thecal  sac and left L4 nerve roots.  Epidural abscess not entirely excluded in the proper clinical setting although a secondary consideration.  This has increased in size since the prior postoperative examination.  L4-5 multifactorial mild to moderate left-sided and mild right- sided lateral recess stenosis.  Mild spinal stenosis.  Mild bilateral foraminal narrowing.  L5-S broad-based slightly caudally extending disc protrusion which touches but does not cause significant mass effect upon the S1 nerve roots or thecal sac.  Lateral extension greater to the right with mild encroaching upon the exiting right L5 nerve root.  Atrophic kidneys more notable on the left.  Arising from the lateral aspect of the left kidney is a 3.2 cm mass which has fatty components.  On the 2009 CT, this measured 5.4 cm (appearing more fatty on the remote exam) and possibly represents treated mass or involuting angiomyolipoma. Clinical correlation recommended.  This is incompletely assessed on present exam.  Hydronephrotic right kidney.  Prominent atherosclerotic type changes of the aorta.  Small bulge L3 level measures 2.4 cm whereas just above this level aorta measures 2.1 cm.  Narrowing of the renal arteries and less so involving the iliac arteries, superior mesenteric and celiac artery.  This has been made a PRA call report utilizing dashboard call feature.   Original Report Authenticated By: Lacy Duverney, M.D.   Ct Knee Right Wo Contrast  04/10/2013   *RADIOLOGY REPORT*  Clinical Data: Knee swelling and fever.  Question osteomyelitis.  CT OF  THE RIGHT KNEE WITHOUT CONTRAST  Technique:  Multidetector CT imaging was performed according to the standard protocol. Multiplanar CT image reconstructions were also generated.  Comparison: None.  Findings: No bony destructive change or focal lesion is identified. Mild chondrocalcinosis is noted.  The patient has only a small joint effusion.  There is diffuse subcutaneous edema about the knee without  focal subcutaneous fluid collection.  Mild degenerative change is seen in the medial compartment.  Atherosclerotic vascular disease is noted.  IMPRESSION:  1.  No CT evidence of osteomyelitis.  Please note that CT scan is not sensitive for the detection of osteomyelitis. 2.  Small joint effusion.  No focal fluid collection is present. 3.  Subcutaneous edema diffusely about the knee is likely due to dependent change or possibly cellulitis. 4.  Atherosclerosis.   Original Report Authenticated By: Holley Dexter, M.D.   Dg Myelogram Lumbar  04/17/2013   *RADIOLOGY REPORT*  Clinical Data: Left leg pain post surgery.  LUMBAR MYELOGRAM AND POST MYELOGRAM CT  MYELOGRAM  LUMBAR  Technique: Order reviewed. As the patient has acute on top of chronic renal insufficiency, the need for the case was discussed with Dr. Gerlene Fee.  Dr. Randell Loop who is following the patient (nephrology service) agreed to low volume intrathecal contrast. Any available prior imaging reviewed.  The procedure and associated risks were reviewed with the patient (including but not limited to headache, bleeding, infection, contrast reaction, nondiagnostic study, nerve damage, all of which may require treatment).  All questions were answered.  Written and oral witnessed consent was obtained.  Time out performed.  Under sterile technique, a  lumbar puncture was performed with a L2- 3 pass of a 22g needle.  Clear CSF was noted. 8cc of Omnipaque 180 was instilled.   Lumbar myelogram fluoroscopic images were obtained.  Post procedure instructions were reviewed with the patient.  Fluoroscopy time:  48 seconds  Comparison: 04/11/2011 CT and preoperative post myelogram 03/16/2011 CT.  Findings: Dextroscoliosis lumbar spine.  L3-4 disc space narrowing greater on the left.  Impression upon the left lateral aspect of the thecal sac at the L3-4 level compressing the left L4 nerve root.  IMPRESSION: Dextroscoliosis lumbar spine.  L3-4 disc space narrowing greater  on the left.  Impression upon the left lateral aspect of the thecal sac at the L3- 4 level compressing the left L4 nerve root.  Please see below for further detail.  CT MYELOGRAPHY LUMBAR SPINE  Technique: CT imaging of the lumbar spine was performed after intrathecal contrast administration.  Multiplanar CT image reconstructions were also generated.  Findings:  Last fully open disc space is labeled L5-S1.  Present examination incorporates from T12-L1 through the mid sacrum.  Atrophic kidneys more notable on the left.  Arising from the lateral aspect of the left kidney is a 3.2 cm mass which has fatty components.  On the 2009 CT, this measured 5.4 cm (appearing more fatty on the remote exam) and possibly represents treated mass or involuting angiomyolipoma. Clinical correlation recommended.  This is incompletely assessed on present exam.  Hydronephrotic right kidney.  Prominent atherosclerotic type changes of the aorta.  Small bulge L3 level measures 2.4 cm whereas just above this level aorta measures 2.1 cm.  Narrowing of the renal arteries and less so involving the iliac arteries, superior mesenteric and celiac artery.  Conus L1 level.  Dextroscoliosis lumbar spine with disc space narrowing most notable on the left at the L3-4 level.  T12-L1:  Negative.  L1-2:  Negative.  L2-3:  Minimal facet joint degenerative changes.  L3-4:  Prominent disc degeneration with disc space narrowing more notable on the left.  Recent laminectomy/partial facetectomy. Facet joint degenerative changes.  Broad-based calcified disc protrusion extending into the neural foramen with prominent bilateral foraminal narrowing and mass effect upon the exiting L3 nerve roots greater on the left. Postsurgical changes surrounding narrowed thecal sac further contributing to moderate to marked thecal sac narrowing.  Additionally, extending caudally from the left posterior lateral aspect of the disc space is a large soft tissue and air containing  collection which may represent extruded disc material reaching the L4-5 disc space and causing mass effect upon the thecal sac and left L4 nerve roots.  Epidural abscess not entirely excluded in the proper clinical setting although a secondary consideration.  This has increased in size since the prior postoperative examination.  L4-5:  Facet joint degenerative changes.  Prior left hemilaminectomy.  Bulge/broad-based protrusion.  Facet joint degenerative changes.  Ligamentum flavum hypertrophy.  Mild to moderate left-sided and mild right-sided lateral recess stenosis. Mild spinal stenosis.  Mild bilateral foraminal narrowing.  L5-S1:  Facet joint degenerative changes.  Disc degeneration with broad-based slightly caudally extending disc protrusion which touches but does not cause significant mass effect upon the S1 nerve roots or thecal sac.  Lateral extension greater to the right with mild encroaching upon the exiting right L5 nerve root.  IMPRESSION: L3-4 recent laminectomy/partial facetectomy. Broad-based calcified disc protrusion extending into the neural foramen with prominent bilateral foraminal narrowing and mass effect upon the exiting L3 nerve roots greater on the left. Postsurgical changes surrounding narrowed thecal sac further contributing to moderate to marked thecal sac narrowing.  Additionally, extending caudally from the left posterior lateral aspect of the disc space is a large soft tissue and air containing collection which may represent extruded disc material reaching the L4-5 disc space and causing mass effect upon the thecal sac and left L4 nerve roots.  Epidural abscess not entirely excluded in the proper clinical setting although a secondary consideration.  This has increased in size since the prior postoperative examination.  L4-5 multifactorial mild to moderate left-sided and mild right- sided lateral recess stenosis.  Mild spinal stenosis.  Mild bilateral foraminal narrowing.  L5-S  broad-based slightly caudally extending disc protrusion which touches but does not cause significant mass effect upon the S1 nerve roots or thecal sac.  Lateral extension greater to the right with mild encroaching upon the exiting right L5 nerve root.  Atrophic kidneys more notable on the left.  Arising from the lateral aspect of the left kidney is a 3.2 cm mass which has fatty components.  On the 2009 CT, this measured 5.4 cm (appearing more fatty on the remote exam) and possibly represents treated mass or involuting angiomyolipoma. Clinical correlation recommended.  This is incompletely assessed on present exam.  Hydronephrotic right kidney.  Prominent atherosclerotic type changes of the aorta.  Small bulge L3 level measures 2.4 cm whereas just above this level aorta measures 2.1 cm.  Narrowing of the renal arteries and less so involving the iliac arteries, superior mesenteric and celiac artery.  This has been made a PRA call report utilizing dashboard call feature.   Original Report Authenticated By: Lacy Duverney, M.D.   US Renal  04/12/2013   *RADIOLOGY REPORT*  Clinical Data: Kidney transplant with elevated creatinine. Evaluate for hydronephrosis. History of fluid overload.  RENAL/URINARY TRACT ULTRASOUND COMPLETE  Comparison:  Lumbar spine CT 04/10/2013 and abdominal CT 05/19/2008  Findings:  Right Kidney:  The native right kidney is small and echogenic.  The right kidney measures 7.7 cm. Difficult to exclude hydronephrosis in the native right kidney.  Cyst in the right kidney measures up to 2.5 cm.  Left Kidney:  Atrophic and echogenic.  Left kidney roughly measures 7.5 cm in length.  Lower pole is difficult to evaluate.  Transplant kidney: The transplant kidney measures 12.4 cm in length.  There are hypoechoic cysts associated with the transplant kidney and a small amount of adjacent ascites.  Largest cyst roughly measures 2.7 cm.  Bladder:  Fluid filled and unremarkable.  Other findings:  There are  bilateral pleural effusions. Evidence for an echogenic stone with posterior acoustic shadowing at the base of the gallbladder.  IMPRESSION: No significant hydronephrosis involving the transplant kidney. There appears to be cortical cysts involving the transplant kidney as described.  Bilateral native kidneys are atrophic and echogenic as expected. Evidence for bilateral renal cortical cysts. Difficult to exclude hydronephrosis of the native right kidney.  Bilateral pleural effusions and ascites.  Cholelithiasis.   Original Report Authenticated By: Richarda Overlie, M.D.   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  Perfusion images were obtained in multiple projections after intravenous injection of Tc-46m MAA.  Radiopharmaceuticals:  Tc-37m DTPA aerosol and 6 mCi Tc-79m MAA.  Comparison: None Correlation:  Chest radiograph 04/09/2013  Findings:  Ventilation:  Absent ventilation in left lower lobe.  Enlargement of cardiac silhouette.  Mildly diminished ventilation in lung apices.  Perfusion:  Cardiomegaly.  Diminished perfusion throughout left lower lobe suspect related to the enlargement of cardiac silhouette, less severe than accompanying ventilatory loss. Minimal diminished perfusion in the left upper lobe matching ventilatory findings. No additional segmental or subsegmental perfusion defects.  IMPRESSION: Very low probability for pulmonary embolism.   Original Report Authenticated By: [name], M.D.   Dg Chest Port 1 View  04/11/2013   *RADIOLOGY REPORT*  Clinical Data: Shortness of breath, evaluate for volume overload  PORTABLE CHEST - 1 VIEW  Comparison: 04/09/2013; 07/27/2012  Findings:  Grossly unchanged marked enlargement of the cardiac silhouette and mediastinal contours.  Stable positioning of support apparatus.  Chronic mild pulmonary venous congestion without frank evidence of edema.  Grossly unchanged bilateral medial basilar heterogeneous opacities.  No new focal airspace opacities.  No definite pleural effusion or pneumothorax.  Unchanged bones.  IMPRESSION:  Chronic findings of cardiomegaly and pulmonary venous congestion without definite evidence of acute superimposed worsening pulmonary edema.   Original Report Authenticated By: Tacey Ruiz, MD   Dg Chest Port 1 View  04/09/2013   *RADIOLOGY REPORT*  Clinical Data: Hypoglycemia and shortness of breath  PORTABLE CHEST - 1 VIEW  Comparison: Chest radiograph 03/23/2013  Findings: Stable enlarged cardiac silhouette.  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  There are low lung volumes and central venous congestion.  No pneumothorax.  Lung bases poorly evaluated  IMPRESSION: Decreased lung volumes and central venous congestion. Cardiomegaly.   Original Report Authenticated By: Genevive Bi, M.D.    Medications: Scheduled Meds: . antiseptic oral rinse  15 mL Mouth Rinse BID  . aspirin EC  325 mg Oral Daily  . atorvastatin  40 mg Oral q1800  .  carvedilol  12.5 mg Oral BID WC  . darbepoetin (ARANESP) injection - NON-DIALYSIS  100 mcg Subcutaneous Q Thu-1800  . enoxaparin (LOVENOX) injection  30 mg Subcutaneous Q24H  . famotidine  20 mg Oral Daily  . furosemide  80 mg Oral Daily  . hydrocerin   Topical BID  . insulin aspart  0-5 Units Subcutaneous QHS  . insulin aspart  0-9 Units Subcutaneous TID WC  . insulin detemir  10 Units Subcutaneous Daily  . mycophenolate  500 mg Oral BID  . omega-3 acid ethyl esters  1 g Oral Daily  . predniSONE  5 mg Oral BID  . pyridOXINE  200 mg Oral Daily  . sodium chloride  3 mL Intravenous Q12H  . vitamin B-12  250 mcg Oral Daily  . vitamin C  500 mg Oral Daily      LOS: 9 days    Wyatt Galvan M.D. [facility] 04/18/2013, 1:41 PM Pager: [phone]  If 7PM-7AM, please contact night-coverage [email or web address] Password [email or web address]

## 2013-04-18 NOTE — Progress Notes (Signed)
Patient ID: [name], male   DOB: 1946-02-16, 67 y.o.   MRN: [id number] The patient has some sort of soft tissue mass next to the thecal sac on the left that would explain his left hip/leg pain. He has been cleared for surgery from cardiac stand point. I have offered to do the surgery before I leave town tomorrow. He is not comfortable with my doing the surgery and then going away, and he has asked if I can see if one of my partners can do the procedure. i will check on that and see what I can arrange for him in light of these unusual circumstances.

## 2013-04-18 NOTE — Progress Notes (Signed)
Patient ID: [name], male   DOB: [birth year], 67 y.o.   MRN: [id number] Subjective:  The patient is alert and pleasant. He complains of left buttock and leg pain. He says "I can't walk"  Objective: Vital signs in last 24 hours: Temp:  [98.3 F (36.8 C)-98.6 F (37 C)] 98.6 F (37 C) (06/04 0413) Pulse Rate:  [80-90] 88 (06/04 0413) Resp:  [16-18] 17 (06/04 0413) BP: (140-167)/(67-88) 152/80 mmHg (06/04 0647) SpO2:  [94 %-96 %] 94 % (06/04 0413) Weight:  [78.9 kg (173 lb 15.1 oz)] 78.9 kg (173 lb 15.1 oz) (06/04 0413)  Intake/Output from previous day: 06/03 0701 - 06/04 0700 In: 360 [P.O.:360] Out: 2951 [Urine:2950; Stool:1] Intake/Output this shift: Total I/O In: 120 [P.O.:120] Out: 225 [Urine:225]  Physical exam the patient is alert and oriented. He has weakness in his left quadriceps at 4/5. The patient's lumbar wound is healing well.  Lab Results:  Recent Labs  04/18/13 0505  WBC 9.7  HGB 9.3*  HCT 30.2*  PLT 172   BMET  Recent Labs  04/17/13 0405 04/18/13 0505  NA 138 140  K 3.9 3.7  CL 97 100  CO2 28 27  GLUCOSE 133* 142*  BUN 51* 45*  CREATININE 3.18* 3.05*  CALCIUM 8.9 8.9    Studies/Results: Dg Hip Complete Left  04/16/2013   *RADIOLOGY REPORT*  Clinical Data: Fall.  Left hip and knee pain.  LEFT HIP - COMPLETE 2+ VIEW  Comparison: None.  Findings: Pelvic rings appear intact.  Right hip hemiarthroplasty. Atherosclerosis.  No displaced pelvic fracture.  Left hip chondrocalcinosis.  No left hip fracture.  No significant joint space narrowing.  IMPRESSION: No acute osseous abnormality.  Chondrocalcinosis of the left hip, which may be associated with CPPD arthropathy or senile.   Original Report Authenticated By: Andreas Newport, M.D.   Ct Lumbar Spine W Contrast  04/17/2013   *RADIOLOGY REPORT*  Clinical Data: Left leg pain post surgery.  LUMBAR MYELOGRAM AND POST MYELOGRAM CT  MYELOGRAM  LUMBAR  Technique: Order reviewed. As the patient has acute on top  of chronic renal insufficiency, the need for the case was discussed with Dr. Gerlene Fee.  Dr. Randell Loop who is following the patient (nephrology service) agreed to low volume intrathecal contrast. Any available prior imaging reviewed.  The procedure and associated risks were reviewed with the patient (including but not limited to headache, bleeding, infection, contrast reaction, nondiagnostic study, nerve damage, all of which may require treatment).  All questions were answered.  Written and oral witnessed consent was obtained.  Time out performed.  Under sterile technique, a  lumbar puncture was performed with a L2- 3 pass of a 22g needle.  Clear CSF was noted. 8cc of Omnipaque 180 was instilled.   Lumbar myelogram fluoroscopic images were obtained.  Post procedure instructions were reviewed with the patient.  Fluoroscopy time:  48 seconds  Comparison: 04/11/2011 CT and preoperative post myelogram 03/16/2011 CT.  Findings: Dextroscoliosis lumbar spine.  L3-4 disc space narrowing greater on the left.  Impression upon the left lateral aspect of the thecal sac at the L3-4 level compressing the left L4 nerve root.  IMPRESSION: Dextroscoliosis lumbar spine.  L3-4 disc space narrowing greater on the left.  Impression upon the left lateral aspect of the thecal sac at the L3- 4 level compressing the left L4 nerve root.  Please see below for further detail.  CT MYELOGRAPHY LUMBAR SPINE  Technique: CT imaging of the lumbar spine was performed after  intrathecal contrast administration.  Multiplanar CT image reconstructions were also generated.  Findings:  Last fully open disc space is labeled L5-S1.  Present examination incorporates from T12-L1 through the mid sacrum.  Atrophic kidneys more notable on the left.  Arising from the lateral aspect of the left kidney is a 3.2 cm mass which has fatty components.  On the 2009 CT, this measured 5.4 cm (appearing more fatty on the remote exam) and possibly represents treated mass or  involuting angiomyolipoma. Clinical correlation recommended.  This is incompletely assessed on present exam.  Hydronephrotic right kidney.  Prominent atherosclerotic type changes of the aorta.  Small bulge L3 level measures 2.4 cm whereas just above this level aorta measures 2.1 cm.  Narrowing of the renal arteries and less so involving the iliac arteries, superior mesenteric and celiac artery.  Conus L1 level.  Dextroscoliosis lumbar spine with disc space narrowing most notable on the left at the L3-4 level.  T12-L1:  Negative.  L1-2:  Negative.  L2-3:  Minimal facet joint degenerative changes.  L3-4:  Prominent disc degeneration with disc space narrowing more notable on the left.  Recent laminectomy/partial facetectomy. Facet joint degenerative changes.  Broad-based calcified disc protrusion extending into the neural foramen with prominent bilateral foraminal narrowing and mass effect upon the exiting L3 nerve roots greater on the left. Postsurgical changes surrounding narrowed thecal sac further contributing to moderate to marked thecal sac narrowing.  Additionally, extending caudally from the left posterior lateral aspect of the disc space is a large soft tissue and air containing collection which may represent extruded disc material reaching the L4-5 disc space and causing mass effect upon the thecal sac and left L4 nerve roots.  Epidural abscess not entirely excluded in the proper clinical setting although a secondary consideration.  This has increased in size since the prior postoperative examination.  L4-5:  Facet joint degenerative changes.  Prior left hemilaminectomy.  Bulge/broad-based protrusion.  Facet joint degenerative changes.  Ligamentum flavum hypertrophy.  Mild to moderate left-sided and mild right-sided lateral recess stenosis. Mild spinal stenosis.  Mild bilateral foraminal narrowing.  L5-S1:  Facet joint degenerative changes.  Disc degeneration with broad-based slightly caudally extending disc  protrusion which touches but does not cause significant mass effect upon the S1 nerve roots or thecal sac.  Lateral extension greater to the right with mild encroaching upon the exiting right L5 nerve root.  IMPRESSION: L3-4 recent laminectomy/partial facetectomy. Broad-based calcified disc protrusion extending into the neural foramen with prominent bilateral foraminal narrowing and mass effect upon the exiting L3 nerve roots greater on the left. Postsurgical changes surrounding narrowed thecal sac further contributing to moderate to marked thecal sac narrowing.  Additionally, extending caudally from the left posterior lateral aspect of the disc space is a large soft tissue and air containing collection which may represent extruded disc material reaching the L4-5 disc space and causing mass effect upon the thecal sac and left L4 nerve roots.  Epidural abscess not entirely excluded in the proper clinical setting although a secondary consideration.  This has increased in size since the prior postoperative examination.  L4-5 multifactorial mild to moderate left-sided and mild right- sided lateral recess stenosis.  Mild spinal stenosis.  Mild bilateral foraminal narrowing.  L5-S broad-based slightly caudally extending disc protrusion which touches but does not cause significant mass effect upon the S1 nerve roots or thecal sac.  Lateral extension greater to the right with mild encroaching upon the exiting right L5 nerve root.  Atrophic  kidneys more notable on the left.  Arising from the lateral aspect of the left kidney is a 3.2 cm mass which has fatty components.  On the 2009 CT, this measured 5.4 cm (appearing more fatty on the remote exam) and possibly represents treated mass or involuting angiomyolipoma. Clinical correlation recommended.  This is incompletely assessed on present exam.  Hydronephrotic right kidney.  Prominent atherosclerotic type changes of the aorta.  Small bulge L3 level measures 2.4 cm whereas  just above this level aorta measures 2.1 cm.  Narrowing of the renal arteries and less so involving the iliac arteries, superior mesenteric and celiac artery.  This has been made a PRA call report utilizing dashboard call feature.   Original Report Authenticated By: Lacy Duverney, M.D.   Dg Myelogram Lumbar  04/17/2013   *RADIOLOGY REPORT*  Clinical Data: Left leg pain post surgery.  LUMBAR MYELOGRAM AND POST MYELOGRAM CT  MYELOGRAM  LUMBAR  Technique: Order reviewed. As the patient has acute on top of chronic renal insufficiency, the need for the case was discussed with Dr. Gerlene Fee.  Dr. Randell Loop who is following the patient (nephrology service) agreed to low volume intrathecal contrast. Any available prior imaging reviewed.  The procedure and associated risks were reviewed with the patient (including but not limited to headache, bleeding, infection, contrast reaction, nondiagnostic study, nerve damage, all of which may require treatment).  All questions were answered.  Written and oral witnessed consent was obtained.  Time out performed.  Under sterile technique, a  lumbar puncture was performed with a L2- 3 pass of a 22g needle.  Clear CSF was noted. 8cc of Omnipaque 180 was instilled.   Lumbar myelogram fluoroscopic images were obtained.  Post procedure instructions were reviewed with the patient.  Fluoroscopy time:  48 seconds  Comparison: 04/11/2011 CT and preoperative post myelogram 03/16/2011 CT.  Findings: Dextroscoliosis lumbar spine.  L3-4 disc space narrowing greater on the left.  Impression upon the left lateral aspect of the thecal sac at the L3-4 level compressing the left L4 nerve root.  IMPRESSION: Dextroscoliosis lumbar spine.  L3-4 disc space narrowing greater on the left.  Impression upon the left lateral aspect of the thecal sac at the L3- 4 level compressing the left L4 nerve root.  Please see below for further detail.  CT MYELOGRAPHY LUMBAR SPINE  Technique: CT imaging of the lumbar spine  was performed after intrathecal contrast administration.  Multiplanar CT image reconstructions were also generated.  Findings:  Last fully open disc space is labeled L5-S1.  Present examination incorporates from T12-L1 through the mid sacrum.  Atrophic kidneys more notable on the left.  Arising from the lateral aspect of the left kidney is a 3.2 cm mass which has fatty components.  On the 2009 CT, this measured 5.4 cm (appearing more fatty on the remote exam) and possibly represents treated mass or involuting angiomyolipoma. Clinical correlation recommended.  This is incompletely assessed on present exam.  Hydronephrotic right kidney.  Prominent atherosclerotic type changes of the aorta.  Small bulge L3 level measures 2.4 cm whereas just above this level aorta measures 2.1 cm.  Narrowing of the renal arteries and less so involving the iliac arteries, superior mesenteric and celiac artery.  Conus L1 level.  Dextroscoliosis lumbar spine with disc space narrowing most notable on the left at the L3-4 level.  T12-L1:  Negative.  L1-2:  Negative.  L2-3:  Minimal facet joint degenerative changes.  L3-4:  Prominent disc degeneration with disc space narrowing  more notable on the left.  Recent laminectomy/partial facetectomy. Facet joint degenerative changes.  Broad-based calcified disc protrusion extending into the neural foramen with prominent bilateral foraminal narrowing and mass effect upon the exiting L3 nerve roots greater on the left. Postsurgical changes surrounding narrowed thecal sac further contributing to moderate to marked thecal sac narrowing.  Additionally, extending caudally from the left posterior lateral aspect of the disc space is a large soft tissue and air containing collection which may represent extruded disc material reaching the L4-5 disc space and causing mass effect upon the thecal sac and left L4 nerve roots.  Epidural abscess not entirely excluded in the proper clinical setting although a  secondary consideration.  This has increased in size since the prior postoperative examination.  L4-5:  Facet joint degenerative changes.  Prior left hemilaminectomy.  Bulge/broad-based protrusion.  Facet joint degenerative changes.  Ligamentum flavum hypertrophy.  Mild to moderate left-sided and mild right-sided lateral recess stenosis. Mild spinal stenosis.  Mild bilateral foraminal narrowing.  L5-S1:  Facet joint degenerative changes.  Disc degeneration with broad-based slightly caudally extending disc protrusion which touches but does not cause significant mass effect upon the S1 nerve roots or thecal sac.  Lateral extension greater to the right with mild encroaching upon the exiting right L5 nerve root.  IMPRESSION: L3-4 recent laminectomy/partial facetectomy. Broad-based calcified disc protrusion extending into the neural foramen with prominent bilateral foraminal narrowing and mass effect upon the exiting L3 nerve roots greater on the left. Postsurgical changes surrounding narrowed thecal sac further contributing to moderate to marked thecal sac narrowing.  Additionally, extending caudally from the left posterior lateral aspect of the disc space is a large soft tissue and air containing collection which may represent extruded disc material reaching the L4-5 disc space and causing mass effect upon the thecal sac and left L4 nerve roots.  Epidural abscess not entirely excluded in the proper clinical setting although a secondary consideration.  This has increased in size since the prior postoperative examination.  L4-5 multifactorial mild to moderate left-sided and mild right- sided lateral recess stenosis.  Mild spinal stenosis.  Mild bilateral foraminal narrowing.  L5-S broad-based slightly caudally extending disc protrusion which touches but does not cause significant mass effect upon the S1 nerve roots or thecal sac.  Lateral extension greater to the right with mild encroaching upon the exiting right L5  nerve root.  Atrophic kidneys more notable on the left.  Arising from the lateral aspect of the left kidney is a 3.2 cm mass which has fatty components.  On the 2009 CT, this measured 5.4 cm (appearing more fatty on the remote exam) and possibly represents treated mass or involuting angiomyolipoma. Clinical correlation recommended.  This is incompletely assessed on present exam.  Hydronephrotic right kidney.  Prominent atherosclerotic type changes of the aorta.  Small bulge L3 level measures 2.4 cm whereas just above this level aorta measures 2.1 cm.  Narrowing of the renal arteries and less so involving the iliac arteries, superior mesenteric and celiac artery.  This has been made a PRA call report utilizing dashboard call feature.   Original Report Authenticated By: Lacy Duverney, M.D.    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 We have discussed the various treatment options including doing nothing, continue medical management, lumbar injections, and surgery. I have described the surgical treatment option of a left L3 for redo laminectomy and discectomy. I have discussed the  risks of surgery including risks of infection, spinal fluid leak, bleeding, nerve injury, medical risk, failure to relieve the pain, worsening of the pain, recurrent ruptured disc, etc. I have answered all the patient's and his wife's questions. The patient was to proceed with surgery. We will tentatively plan to do this tomorrow afternoon.  LOS: 9 days     Bradrick Kamau D 04/18/2013, 2:16 PM

## 2013-04-18 NOTE — Progress Notes (Signed)
Patient ID: [name], male   DOB: [birth year], 67 y.o.   MRN: [id number] S:still with persistent left hip pain O:BP 152/80  Pulse 88  Temp(Src) 98.6 F (37 C) (Oral)  Resp 17  Ht 6\' 1"  (1.854 m)  Wt 78.9 kg (173 lb 15.1 oz)  BMI 22.95 kg/m2  SpO2 94%  Intake/Output Summary (Last 24 hours) at 04/18/13 1058 Last data filed at 04/18/13 0800  Gross per 24 hour  Intake    360 ml  Output   2350 ml  Net  -1990 ml   Intake/Output: I/O last 3 completed shifts: In: 410 [P.O.:360; IV Piggyback:50] Out: 4351 [Urine:4350; Stool:1]  Intake/Output this shift:  Total I/O In: 120 [P.O.:120] Out: -  Weight change: -10.431 kg (-22 lb 15.9 oz) Gen:WD WN WM in nad CVS:no rub Resp:cta WUJ:WJXBJY Ext:+1 edema   Recent Labs Lab 04/12/13 0355 04/13/13 0543 04/14/13 0435 04/16/13 0535 04/17/13 0405 04/18/13 0505  NA 136 135 138 139 138 140  K 4.6 4.4 3.7 4.1 3.9 3.7  CL 101 100 100 97 97 100  CO2 21 21 23 26 28 27   GLUCOSE 119* 165* 142* 100* 133* 142*  BUN 60* 62* 61* 51* 51* 45*  CREATININE 2.77* 2.93* 3.00* 3.07* 3.18* 3.05*  ALBUMIN 1.9* 1.9* 2.1* 2.2*  --   --   CALCIUM 8.6 8.8 8.7 8.6 8.9 8.9  PHOS 5.2* 5.6* 5.2* 4.7*  --   --   AST  --  24  --   --   --   --   ALT  --  22  --   --   --   --    Liver Function Tests:  Recent Labs Lab 04/13/13 0543 04/14/13 0435 04/16/13 0535  AST 24  --   --   ALT 22  --   --   ALKPHOS 83  --   --   BILITOT 0.2*  --   --   PROT 4.9*  --   --   ALBUMIN 1.9* 2.1* 2.2*   No results found for this basename: LIPASE, AMYLASE,  in the last 168 hours No results found for this basename: AMMONIA,  in the last 168 hours CBC:  Recent Labs Lab 04/12/13 0355 04/13/13 0543 04/14/13 0435 04/15/13 1015 04/18/13 0505  WBC 9.8 8.0 5.5 7.2 9.7  NEUTROABS  --   --   --  5.9  --   HGB 8.2* 8.2* 8.4* 8.9* 9.3*  HCT 26.4* 26.2* 26.5* 27.9* 30.2*  MCV 95.7 93.2 92.0 92.4 93.5  PLT 85* 96* 114* 122* 172   Cardiac Enzymes: No results found  for this basename: CKTOTAL, CKMB, CKMBINDEX, TROPONINI,  in the last 168 hours CBG:  Recent Labs Lab 04/17/13 0617 04/17/13 1133 04/17/13 1605 04/17/13 2113 04/18/13 0615  GLUCAP 136* 116* 115* 156* 134*    Iron Studies: No results found for this basename: IRON, TIBC, TRANSFERRIN, FERRITIN,  in the last 72 hours Studies/Results: Dg Hip Complete Left  04/16/2013   *RADIOLOGY REPORT*  Clinical Data: Fall.  Left hip and knee pain.  LEFT HIP - COMPLETE 2+ VIEW  Comparison: None.  Findings: Pelvic rings appear intact.  Right hip hemiarthroplasty. Atherosclerosis.  No displaced pelvic fracture.  Left hip chondrocalcinosis.  No left hip fracture.  No significant joint space narrowing.  IMPRESSION: No acute osseous abnormality.  Chondrocalcinosis of the left hip, which may be associated with CPPD arthropathy or senile.   Original Report Authenticated By: Andreas Newport,  M.D.   Ct Lumbar Spine W Contrast  04/17/2013   *RADIOLOGY REPORT*  Clinical Data: Left leg pain post surgery.  LUMBAR MYELOGRAM AND POST MYELOGRAM CT  MYELOGRAM  LUMBAR  Technique: Order reviewed. As the patient has acute on top of chronic renal insufficiency, the need for the case was discussed with Dr. Gerlene Fee.  Dr. Randell Loop who is following the patient (nephrology service) agreed to low volume intrathecal contrast. Any available prior imaging reviewed.  The procedure and associated risks were reviewed with the patient (including but not limited to headache, bleeding, infection, contrast reaction, nondiagnostic study, nerve damage, all of which may require treatment).  All questions were answered.  Written and oral witnessed consent was obtained.  Time out performed.  Under sterile technique, a  lumbar puncture was performed with a L2- 3 pass of a 22g needle.  Clear CSF was noted. 8cc of Omnipaque 180 was instilled.   Lumbar myelogram fluoroscopic images were obtained.  Post procedure instructions were reviewed with the patient.   Fluoroscopy time:  48 seconds  Comparison: 04/11/2011 CT and preoperative post myelogram 03/16/2011 CT.  Findings: Dextroscoliosis lumbar spine.  L3-4 disc space narrowing greater on the left.  Impression upon the left lateral aspect of the thecal sac at the L3-4 level compressing the left L4 nerve root.  IMPRESSION: Dextroscoliosis lumbar spine.  L3-4 disc space narrowing greater on the left.  Impression upon the left lateral aspect of the thecal sac at the L3- 4 level compressing the left L4 nerve root.  Please see below for further detail.  CT MYELOGRAPHY LUMBAR SPINE  Technique: CT imaging of the lumbar spine was performed after intrathecal contrast administration.  Multiplanar CT image reconstructions were also generated.  Findings:  Last fully open disc space is labeled L5-S1.  Present examination incorporates from T12-L1 through the mid sacrum.  Atrophic kidneys more notable on the left.  Arising from the lateral aspect of the left kidney is a 3.2 cm mass which has fatty components.  On the 2009 CT, this measured 5.4 cm (appearing more fatty on the remote exam) and possibly represents treated mass or involuting angiomyolipoma. Clinical correlation recommended.  This is incompletely assessed on present exam.  Hydronephrotic right kidney.  Prominent atherosclerotic type changes of the aorta.  Small bulge L3 level measures 2.4 cm whereas just above this level aorta measures 2.1 cm.  Narrowing of the renal arteries and less so involving the iliac arteries, superior mesenteric and celiac artery.  Conus L1 level.  Dextroscoliosis lumbar spine with disc space narrowing most notable on the left at the L3-4 level.  T12-L1:  Negative.  L1-2:  Negative.  L2-3:  Minimal facet joint degenerative changes.  L3-4:  Prominent disc degeneration with disc space narrowing more notable on the left.  Recent laminectomy/partial facetectomy. Facet joint degenerative changes.  Broad-based calcified disc protrusion extending into the  neural foramen with prominent bilateral foraminal narrowing and mass effect upon the exiting L3 nerve roots greater on the left. Postsurgical changes surrounding narrowed thecal sac further contributing to moderate to marked thecal sac narrowing.  Additionally, extending caudally from the left posterior lateral aspect of the disc space is a large soft tissue and air containing collection which may represent extruded disc material reaching the L4-5 disc space and causing mass effect upon the thecal sac and left L4 nerve roots.  Epidural abscess not entirely excluded in the proper clinical setting although a secondary consideration.  This has increased in size since the  prior postoperative examination.  L4-5:  Facet joint degenerative changes.  Prior left hemilaminectomy.  Bulge/broad-based protrusion.  Facet joint degenerative changes.  Ligamentum flavum hypertrophy.  Mild to moderate left-sided and mild right-sided lateral recess stenosis. Mild spinal stenosis.  Mild bilateral foraminal narrowing.  L5-S1:  Facet joint degenerative changes.  Disc degeneration with broad-based slightly caudally extending disc protrusion which touches but does not cause significant mass effect upon the S1 nerve roots or thecal sac.  Lateral extension greater to the right with mild encroaching upon the exiting right L5 nerve root.  IMPRESSION: L3-4 recent laminectomy/partial facetectomy. Broad-based calcified disc protrusion extending into the neural foramen with prominent bilateral foraminal narrowing and mass effect upon the exiting L3 nerve roots greater on the left. Postsurgical changes surrounding narrowed thecal sac further contributing to moderate to marked thecal sac narrowing.  Additionally, extending caudally from the left posterior lateral aspect of the disc space is a large soft tissue and air containing collection which may represent extruded disc material reaching the L4-5 disc space and causing mass effect upon the thecal  sac and left L4 nerve roots.  Epidural abscess not entirely excluded in the proper clinical setting although a secondary consideration.  This has increased in size since the prior postoperative examination.  L4-5 multifactorial mild to moderate left-sided and mild right- sided lateral recess stenosis.  Mild spinal stenosis.  Mild bilateral foraminal narrowing.  L5-S broad-based slightly caudally extending disc protrusion which touches but does not cause significant mass effect upon the S1 nerve roots or thecal sac.  Lateral extension greater to the right with mild encroaching upon the exiting right L5 nerve root.  Atrophic kidneys more notable on the left.  Arising from the lateral aspect of the left kidney is a 3.2 cm mass which has fatty components.  On the 2009 CT, this measured 5.4 cm (appearing more fatty on the remote exam) and possibly represents treated mass or involuting angiomyolipoma. Clinical correlation recommended.  This is incompletely assessed on present exam.  Hydronephrotic right kidney.  Prominent atherosclerotic type changes of the aorta.  Small bulge L3 level measures 2.4 cm whereas just above this level aorta measures 2.1 cm.  Narrowing of the renal arteries and less so involving the iliac arteries, superior mesenteric and celiac artery.  This has been made a PRA call report utilizing dashboard call feature.   Original Report Authenticated By: Lacy Duverney, M.D.   Dg Myelogram Lumbar  04/17/2013   *RADIOLOGY REPORT*  Clinical Data: Left leg pain post surgery.  LUMBAR MYELOGRAM AND POST MYELOGRAM CT  MYELOGRAM  LUMBAR  Technique: Order reviewed. As the patient has acute on top of chronic renal insufficiency, the need for the case was discussed with Dr. Gerlene Fee.  Dr. Randell Loop who is following the patient (nephrology service) agreed to low volume intrathecal contrast. Any available prior imaging reviewed.  The procedure and associated risks were reviewed with the patient (including but not  limited to headache, bleeding, infection, contrast reaction, nondiagnostic study, nerve damage, all of which may require treatment).  All questions were answered.  Written and oral witnessed consent was obtained.  Time out performed.  Under sterile technique, a  lumbar puncture was performed with a L2- 3 pass of a 22g needle.  Clear CSF was noted. 8cc of Omnipaque 180 was instilled.   Lumbar myelogram fluoroscopic images were obtained.  Post procedure instructions were reviewed with the patient.  Fluoroscopy time:  48 seconds  Comparison: 04/11/2011 CT and preoperative post myelogram  03/16/2011 CT.  Findings: Dextroscoliosis lumbar spine.  L3-4 disc space narrowing greater on the left.  Impression upon the left lateral aspect of the thecal sac at the L3-4 level compressing the left L4 nerve root.  IMPRESSION: Dextroscoliosis lumbar spine.  L3-4 disc space narrowing greater on the left.  Impression upon the left lateral aspect of the thecal sac at the L3- 4 level compressing the left L4 nerve root.  Please see below for further detail.  CT MYELOGRAPHY LUMBAR SPINE  Technique: CT imaging of the lumbar spine was performed after intrathecal contrast administration.  Multiplanar CT image reconstructions were also generated.  Findings:  Last fully open disc space is labeled L5-S1.  Present examination incorporates from T12-L1 through the mid sacrum.  Atrophic kidneys more notable on the left.  Arising from the lateral aspect of the left kidney is a 3.2 cm mass which has fatty components.  On the 2009 CT, this measured 5.4 cm (appearing more fatty on the remote exam) and possibly represents treated mass or involuting angiomyolipoma. Clinical correlation recommended.  This is incompletely assessed on present exam.  Hydronephrotic right kidney.  Prominent atherosclerotic type changes of the aorta.  Small bulge L3 level measures 2.4 cm whereas just above this level aorta measures 2.1 cm.  Narrowing of the renal arteries and  less so involving the iliac arteries, superior mesenteric and celiac artery.  Conus L1 level.  Dextroscoliosis lumbar spine with disc space narrowing most notable on the left at the L3-4 level.  T12-L1:  Negative.  L1-2:  Negative.  L2-3:  Minimal facet joint degenerative changes.  L3-4:  Prominent disc degeneration with disc space narrowing more notable on the left.  Recent laminectomy/partial facetectomy. Facet joint degenerative changes.  Broad-based calcified disc protrusion extending into the neural foramen with prominent bilateral foraminal narrowing and mass effect upon the exiting L3 nerve roots greater on the left. Postsurgical changes surrounding narrowed thecal sac further contributing to moderate to marked thecal sac narrowing.  Additionally, extending caudally from the left posterior lateral aspect of the disc space is a large soft tissue and air containing collection which may represent extruded disc material reaching the L4-5 disc space and causing mass effect upon the thecal sac and left L4 nerve roots.  Epidural abscess not entirely excluded in the proper clinical setting although a secondary consideration.  This has increased in size since the prior postoperative examination.  L4-5:  Facet joint degenerative changes.  Prior left hemilaminectomy.  Bulge/broad-based protrusion.  Facet joint degenerative changes.  Ligamentum flavum hypertrophy.  Mild to moderate left-sided and mild right-sided lateral recess stenosis. Mild spinal stenosis.  Mild bilateral foraminal narrowing.  L5-S1:  Facet joint degenerative changes.  Disc degeneration with broad-based slightly caudally extending disc protrusion which touches but does not cause significant mass effect upon the S1 nerve roots or thecal sac.  Lateral extension greater to the right with mild encroaching upon the exiting right L5 nerve root.  IMPRESSION: L3-4 recent laminectomy/partial facetectomy. Broad-based calcified disc protrusion extending into the  neural foramen with prominent bilateral foraminal narrowing and mass effect upon the exiting L3 nerve roots greater on the left. Postsurgical changes surrounding narrowed thecal sac further contributing to moderate to marked thecal sac narrowing.  Additionally, extending caudally from the left posterior lateral aspect of the disc space is a large soft tissue and air containing collection which may represent extruded disc material reaching the L4-5 disc space and causing mass effect upon the thecal sac and left  L4 nerve roots.  Epidural abscess not entirely excluded in the proper clinical setting although a secondary consideration.  This has increased in size since the prior postoperative examination.  L4-5 multifactorial mild to moderate left-sided and mild right- sided lateral recess stenosis.  Mild spinal stenosis.  Mild bilateral foraminal narrowing.  L5-S broad-based slightly caudally extending disc protrusion which touches but does not cause significant mass effect upon the S1 nerve roots or thecal sac.  Lateral extension greater to the right with mild encroaching upon the exiting right L5 nerve root.  Atrophic kidneys more notable on the left.  Arising from the lateral aspect of the left kidney is a 3.2 cm mass which has fatty components.  On the 2009 CT, this measured 5.4 cm (appearing more fatty on the remote exam) and possibly represents treated mass or involuting angiomyolipoma. Clinical correlation recommended.  This is incompletely assessed on present exam.  Hydronephrotic right kidney.  Prominent atherosclerotic type changes of the aorta.  Small bulge L3 level measures 2.4 cm whereas just above this level aorta measures 2.1 cm.  Narrowing of the renal arteries and less so involving the iliac arteries, superior mesenteric and celiac artery.  This has been made a PRA call report utilizing dashboard call feature.   Original Report Authenticated By: Lacy Duverney, M.D.   . antiseptic oral rinse  15 mL  Mouth Rinse BID  . aspirin EC  325 mg Oral Daily  . atorvastatin  40 mg Oral q1800  . carvedilol  12.5 mg Oral BID WC  . darbepoetin (ARANESP) injection - NON-DIALYSIS  100 mcg Subcutaneous Q Thu-1800  . enoxaparin (LOVENOX) injection  30 mg Subcutaneous Q24H  . famotidine  20 mg Oral Daily  . furosemide  80 mg Oral Daily  . hydrocerin   Topical BID  . insulin aspart  0-5 Units Subcutaneous QHS  . insulin aspart  0-9 Units Subcutaneous TID WC  . insulin detemir  10 Units Subcutaneous Daily  . mycophenolate  500 mg Oral BID  . omega-3 acid ethyl esters  1 g Oral Daily  . predniSONE  5 mg Oral BID  . pyridOXINE  200 mg Oral Daily  . sodium chloride  3 mL Intravenous Q12H  . vitamin B-12  250 mcg Oral Daily  . vitamin C  500 mg Oral Daily    BMET    Component Value Date/Time   NA 140 04/18/2013 0505   K 3.7 04/18/2013 0505   CL 100 04/18/2013 0505   CO2 27 04/18/2013 0505   GLUCOSE 142* 04/18/2013 0505   BUN 45* 04/18/2013 0505   CREATININE 3.05* 04/18/2013 0505   CALCIUM 8.9 04/18/2013 0505   GFRNONAA 20* 04/18/2013 0505   GFRAA 23* 04/18/2013 0505   CBC    Component Value Date/Time   WBC 9.7 04/18/2013 0505   RBC 3.23* 04/18/2013 0505   HGB 9.3* 04/18/2013 0505   HCT 30.2* 04/18/2013 0505   PLT 172 04/18/2013 0505   MCV 93.5 04/18/2013 0505   MCH 28.8 04/18/2013 0505   MCHC 30.8 04/18/2013 0505   RDW 17.7* 04/18/2013 0505   LYMPHSABS 0.9 04/15/2013 1015   MONOABS 0.4 04/15/2013 1015   EOSABS 0.1 04/15/2013 1015   BASOSABS 0.0 04/15/2013 1015     Assessment/Plan:  1. 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 AKI likely related to aggressive diuresis and ongoing diarrhea. Switched over to po lasix and continues with significant diuresis. Will hold  pm dose of lasix today and follow. S/p CT myelogram without change in Scr.  Cont to follow.  2. CHF- markedly improved volume status. EF 30-35%. On lower dose of po lasix (will hold pm dose today due to rise in Scr).  Negative ~10kg since admission 3. Pericardial effusion without tamponade. Agree with repeat ECHO 4. Left hip pain- CT myelogram revealed soft tissue mass in L4-5 territory (possible extruded material from disk impinging upon the thecal sac.  Being evaluated by Neurosurgery. 5. DM- stable 6. Diarrhea/Nausea- may benefit from immodium or lomotil. Likely related to ongoing abx. Consider probiotics.  7. H/o Vfib arrest, s/p AICD 8. Immunosuppressive therapy- cont with MMF and pred 9. Cellulitis- on abx but would renal dose at 2.75gm q8 and consider narrowing coverage as this is likely making diarrhea worse. Has been on abx for 7 days and markedly improved. Will d/c zosyn now. 10. Anemia- normocytic. May benefit from ESA. Will cont to follow 11.   Juanita Streight A

## 2013-04-19 ENCOUNTER — Inpatient Hospital Stay (HOSPITAL_COMMUNITY): Payer: PRIVATE HEALTH INSURANCE | Admitting: Certified Registered Nurse Anesthetist

## 2013-04-19 ENCOUNTER — Encounter (HOSPITAL_COMMUNITY)
Admission: EM | Disposition: A | Discharge status: Rehabilitation Facility | Payer: Self-pay | Source: Home / Self Care | Attending: Internal Medicine

## 2013-04-19 ENCOUNTER — Inpatient Hospital Stay (HOSPITAL_COMMUNITY): Payer: PRIVATE HEALTH INSURANCE

## 2013-04-19 ENCOUNTER — Encounter (HOSPITAL_COMMUNITY): Payer: Self-pay | Admitting: Certified Registered Nurse Anesthetist

## 2013-04-19 ENCOUNTER — Encounter (HOSPITAL_COMMUNITY): Payer: Self-pay | Admitting: Anesthesiology

## 2013-04-19 HISTORY — PX: LUMBAR LAMINECTOMY/DECOMPRESSION MICRODISCECTOMY: SHX5026

## 2013-04-19 LAB — CBC
HCT: 30.4 % — ABNORMAL LOW (ref 39.0–52.0)
Hemoglobin: 9.5 g/dL — ABNORMAL LOW (ref 13.0–17.0)
MCH: 29.5 pg (ref 26.0–34.0)
RBC: 3.22 MIL/uL — ABNORMAL LOW (ref 4.22–5.81)

## 2013-04-19 LAB — GLUCOSE, CAPILLARY

## 2013-04-19 LAB — COMPREHENSIVE METABOLIC PANEL
ALT: 23 U/L (ref 0–53)
Alkaline Phosphatase: 88 U/L (ref 39–117)
CO2: 27 mEq/L (ref 19–32)
GFR calc Af Amer: 27 mL/min — ABNORMAL LOW (ref >90)
GFR calc non Af Amer: 23 mL/min — ABNORMAL LOW (ref >90)
Glucose, Bld: 172 mg/dL — ABNORMAL HIGH (ref 70–99)
Potassium: 3.6 mEq/L (ref 3.5–5.1)
Sodium: 137 mEq/L (ref 135–145)
Total Bilirubin: 0.3 mg/dL (ref 0.3–1.2)

## 2013-04-19 LAB — PROTIME-INR
INR: 1.02 (ref 0.00–1.49)
Prothrombin Time: 13.3 seconds (ref 11.6–15.2)

## 2013-04-19 SURGERY — LUMBAR LAMINECTOMY/DECOMPRESSION MICRODISCECTOMY 1 LEVEL
Anesthesia: General | Site: Back | Wound class: Clean

## 2013-04-19 MED ORDER — SODIUM CHLORIDE 0.9 % IR SOLN
Status: DC | PRN
  Administered 2013-04-19: 17:00:00

## 2013-04-19 MED ORDER — GLYCOPYRROLATE 0.2 MG/ML IJ SOLN
INTRAMUSCULAR | Status: DC | PRN
  Administered 2013-04-19: 0.4 mg via INTRAVENOUS

## 2013-04-19 MED ORDER — CEFAZOLIN SODIUM-DEXTROSE 2-3 GM-% IV SOLR
INTRAVENOUS | Status: AC
  Administered 2013-04-19: 2 g via INTRAVENOUS
  Filled 2013-04-19: qty 50

## 2013-04-19 MED ORDER — OXYCODONE HCL 5 MG PO TABS
ORAL_TABLET | ORAL | Status: AC
  Filled 2013-04-19: qty 1

## 2013-04-19 MED ORDER — LABETALOL HCL 5 MG/ML IV SOLN
INTRAVENOUS | Status: DC | PRN
  Administered 2013-04-19 (×2): 10 mg via INTRAVENOUS

## 2013-04-19 MED ORDER — METOCLOPRAMIDE HCL 5 MG/ML IJ SOLN: 10.0000 mg | Freq: Once | INTRAMUSCULAR | Status: DC | PRN

## 2013-04-19 MED ORDER — ONDANSETRON HCL 4 MG/2ML IJ SOLN
4.0000 mg | INTRAMUSCULAR | Status: DC | PRN
  Administered 2013-04-21 – 2013-04-22 (×3): 4 mg via INTRAVENOUS
  Filled 2013-04-19 (×3): qty 2

## 2013-04-19 MED ORDER — PHENOL 1.4 % MT LIQD
1.0000 | OROMUCOSAL | Status: DC | PRN
  Administered 2013-04-20: 1 via OROMUCOSAL
  Filled 2013-04-19: qty 177

## 2013-04-19 MED ORDER — PHENYLEPHRINE HCL 10 MG/ML IJ SOLN
INTRAMUSCULAR | Status: DC | PRN
  Administered 2013-04-19: 40 ug via INTRAVENOUS
  Administered 2013-04-19: 20 ug via INTRAVENOUS
  Administered 2013-04-19 (×2): 40 ug via INTRAVENOUS
  Administered 2013-04-19: 20 ug via INTRAVENOUS

## 2013-04-19 MED ORDER — HYDROMORPHONE HCL PF 1 MG/ML IJ SOLN
INTRAMUSCULAR | Status: AC
  Filled 2013-04-19: qty 1

## 2013-04-19 MED ORDER — BACITRACIN 50000 UNITS IM SOLR
INTRAMUSCULAR | Status: AC
  Filled 2013-04-19: qty 1

## 2013-04-19 MED ORDER — ETOMIDATE 2 MG/ML IV SOLN
INTRAVENOUS | Status: DC | PRN
  Administered 2013-04-19: 14 mg via INTRAVENOUS

## 2013-04-19 MED ORDER — OXYCODONE HCL 5 MG PO TABS
5.0000 mg | ORAL_TABLET | Freq: Once | ORAL | Status: AC | PRN
  Administered 2013-04-19: 5 mg via ORAL

## 2013-04-19 MED ORDER — HYDROMORPHONE HCL PF 1 MG/ML IJ SOLN
0.2500 mg | INTRAMUSCULAR | Status: DC | PRN
  Administered 2013-04-19 (×4): 0.5 mg via INTRAVENOUS

## 2013-04-19 MED ORDER — ACETAMINOPHEN 325 MG PO TABS: 650.0000 mg | ORAL_TABLET | ORAL | Status: DC | PRN

## 2013-04-19 MED ORDER — HYDRALAZINE HCL 20 MG/ML IJ SOLN
INTRAMUSCULAR | Status: DC | PRN
  Administered 2013-04-19 (×2): 4 mg via INTRAVENOUS

## 2013-04-19 MED ORDER — CEFAZOLIN SODIUM-DEXTROSE 2-3 GM-% IV SOLR
2.0000 g | Freq: Three times a day (TID) | INTRAVENOUS | Status: AC
  Administered 2013-04-19 – 2013-04-20 (×2): 2 g via INTRAVENOUS
  Filled 2013-04-19 (×2): qty 50

## 2013-04-19 MED ORDER — OXYCODONE-ACETAMINOPHEN 5-325 MG PO TABS
1.0000 | ORAL_TABLET | ORAL | Status: DC | PRN
  Administered 2013-04-19 – 2013-04-23 (×16): 2 via ORAL
  Filled 2013-04-19 (×15): qty 2

## 2013-04-19 MED ORDER — ARTIFICIAL TEARS OP OINT
TOPICAL_OINTMENT | OPHTHALMIC | Status: DC | PRN
  Administered 2013-04-19: 1 via OPHTHALMIC

## 2013-04-19 MED ORDER — SODIUM CHLORIDE 0.9 % IV SOLN
INTRAVENOUS | Status: DC | PRN
  Administered 2013-04-19: 16:00:00 via INTRAVENOUS

## 2013-04-19 MED ORDER — ESMOLOL HCL 10 MG/ML IV SOLN
INTRAVENOUS | Status: DC | PRN
  Administered 2013-04-19 (×2): 20 mg via INTRAVENOUS

## 2013-04-19 MED ORDER — ROCURONIUM BROMIDE 100 MG/10ML IV SOLN
INTRAVENOUS | Status: DC | PRN
  Administered 2013-04-19: 50 mg via INTRAVENOUS

## 2013-04-19 MED ORDER — ACETAMINOPHEN 650 MG RE SUPP: 650.0000 mg | RECTAL | Status: DC | PRN

## 2013-04-19 MED ORDER — DIAZEPAM 5 MG PO TABS: 5.0000 mg | ORAL_TABLET | Freq: Four times a day (QID) | ORAL | Status: DC | PRN

## 2013-04-19 MED ORDER — MENTHOL 3 MG MT LOZG
1.0000 | LOZENGE | OROMUCOSAL | Status: DC | PRN
  Filled 2013-04-19: qty 9

## 2013-04-19 MED ORDER — FENTANYL CITRATE 0.05 MG/ML IJ SOLN: 50.0000 ug | Freq: Once | INTRAMUSCULAR | Status: DC

## 2013-04-19 MED ORDER — OXYCODONE HCL 5 MG/5ML PO SOLN: 5.0000 mg | Freq: Once | ORAL | Status: AC | PRN

## 2013-04-19 MED ORDER — ALUM & MAG HYDROXIDE-SIMETH 200-200-20 MG/5ML PO SUSP: 30.0000 mL | Freq: Four times a day (QID) | ORAL | Status: DC | PRN

## 2013-04-19 MED ORDER — FENTANYL CITRATE 0.05 MG/ML IJ SOLN
INTRAMUSCULAR | Status: DC | PRN
  Administered 2013-04-19: 25 ug via INTRAVENOUS
  Administered 2013-04-19: 50 ug via INTRAVENOUS
  Administered 2013-04-19: 100 ug via INTRAVENOUS
  Administered 2013-04-19: 25 ug via INTRAVENOUS
  Administered 2013-04-19: 50 ug via INTRAVENOUS

## 2013-04-19 MED ORDER — ONDANSETRON HCL 4 MG/2ML IJ SOLN
INTRAMUSCULAR | Status: DC | PRN
  Administered 2013-04-19: 4 mg via INTRAVENOUS

## 2013-04-19 MED ORDER — MORPHINE SULFATE 2 MG/ML IJ SOLN: 1.0000 mg | INTRAMUSCULAR | Status: DC | PRN

## 2013-04-19 MED ORDER — MIDAZOLAM HCL 2 MG/2ML IJ SOLN: 1.0000 mg | INTRAMUSCULAR | Status: DC | PRN

## 2013-04-19 MED ORDER — HYDROCODONE-ACETAMINOPHEN 5-325 MG PO TABS: 1.0000 | ORAL_TABLET | ORAL | Status: DC | PRN

## 2013-04-19 MED ORDER — LACTATED RINGERS IV SOLN
INTRAVENOUS | Status: DC
  Administered 2013-04-19 – 2013-04-20 (×2): via INTRAVENOUS

## 2013-04-19 MED ORDER — HEMOSTATIC AGENTS (NO CHARGE) OPTIME
TOPICAL | Status: DC | PRN
  Administered 2013-04-19: 1 via TOPICAL

## 2013-04-19 MED ORDER — SODIUM CHLORIDE 0.9 % IV SOLN
INTRAVENOUS | Status: AC
  Filled 2013-04-19: qty 500

## 2013-04-19 MED ORDER — NEOSTIGMINE METHYLSULFATE 1 MG/ML IJ SOLN
INTRAMUSCULAR | Status: DC | PRN
  Administered 2013-04-19: 3 mg via INTRAVENOUS

## 2013-04-19 MED ORDER — THROMBIN 5000 UNITS EX SOLR
CUTANEOUS | Status: DC | PRN
  Administered 2013-04-19 (×2): 5000 [IU] via TOPICAL

## 2013-04-19 MED ORDER — 0.9 % SODIUM CHLORIDE (POUR BTL) OPTIME
TOPICAL | Status: DC | PRN
  Administered 2013-04-19: 1000 mL

## 2013-04-19 MED ORDER — LACTATED RINGERS IV SOLN
INTRAVENOUS | Status: DC | PRN
  Administered 2013-04-19: 16:00:00 via INTRAVENOUS

## 2013-04-19 MED ORDER — DOCUSATE SODIUM 100 MG PO CAPS
100.0000 mg | ORAL_CAPSULE | Freq: Two times a day (BID) | ORAL | Status: DC
  Administered 2013-04-19 – 2013-04-23 (×8): 100 mg via ORAL
  Filled 2013-04-19 (×8): qty 1

## 2013-04-19 SURGICAL SUPPLY — 54 items
BAG DECANTER FOR FLEXI CONT (MISCELLANEOUS) ×2 IMPLANT
BENZOIN TINCTURE PRP APPL 2/3 (GAUZE/BANDAGES/DRESSINGS) ×2 IMPLANT
BLADE SURG ROTATE 9660 (MISCELLANEOUS) IMPLANT
BRUSH SCRUB EZ PLAIN DRY (MISCELLANEOUS) ×2 IMPLANT
BUR ACORN 6.0 (BURR) ×2 IMPLANT
BUR MATCHSTICK NEURO 3.0 LAGG (BURR) ×2 IMPLANT
CANISTER SUCTION 2500CC (MISCELLANEOUS) ×2 IMPLANT
CLOTH BEACON ORANGE TIMEOUT ST (SAFETY) ×2 IMPLANT
CONT SPEC 4OZ CLIKSEAL STRL BL (MISCELLANEOUS) ×2 IMPLANT
DRAPE LAPAROTOMY 100X72X124 (DRAPES) ×2 IMPLANT
DRAPE MICROSCOPE LEICA (MISCELLANEOUS) ×2 IMPLANT
DRAPE POUCH INSTRU U-SHP 10X18 (DRAPES) ×2 IMPLANT
DRAPE SURG 17X23 STRL (DRAPES) ×8 IMPLANT
ELECT BLADE 4.0 EZ CLEAN MEGAD (MISCELLANEOUS) ×2
ELECT REM PT RETURN 9FT ADLT (ELECTROSURGICAL) ×2
ELECTRODE BLDE 4.0 EZ CLN MEGD (MISCELLANEOUS) ×1 IMPLANT
ELECTRODE REM PT RTRN 9FT ADLT (ELECTROSURGICAL) ×1 IMPLANT
GAUZE SPONGE 4X4 16PLY XRAY LF (GAUZE/BANDAGES/DRESSINGS) IMPLANT
GLOVE BIO SURGEON STRL SZ8.5 (GLOVE) ×6 IMPLANT
GLOVE BIOGEL PI IND STRL 7.5 (GLOVE) ×2 IMPLANT
GLOVE BIOGEL PI INDICATOR 7.5 (GLOVE) ×2
GLOVE ECLIPSE 7.5 STRL STRAW (GLOVE) ×4 IMPLANT
GLOVE ECLIPSE 8.0 STRL XLNG CF (GLOVE) ×2 IMPLANT
GLOVE EXAM NITRILE LRG STRL (GLOVE) IMPLANT
GLOVE EXAM NITRILE MD LF STRL (GLOVE) IMPLANT
GLOVE EXAM NITRILE XL STR (GLOVE) IMPLANT
GLOVE EXAM NITRILE XS STR PU (GLOVE) IMPLANT
GLOVE SS BIOGEL STRL SZ 8 (GLOVE) ×2 IMPLANT
GLOVE SUPERSENSE BIOGEL SZ 8 (GLOVE) ×2
GOWN BRE IMP SLV AUR LG STRL (GOWN DISPOSABLE) IMPLANT
GOWN BRE IMP SLV AUR XL STRL (GOWN DISPOSABLE) ×8 IMPLANT
GOWN STRL REIN 2XL LVL4 (GOWN DISPOSABLE) IMPLANT
KIT BASIN OR (CUSTOM PROCEDURE TRAY) ×2 IMPLANT
KIT ROOM TURNOVER OR (KITS) ×2 IMPLANT
NEEDLE HYPO 21X1.5 SAFETY (NEEDLE) IMPLANT
NEEDLE HYPO 22GX1.5 SAFETY (NEEDLE) ×2 IMPLANT
NS IRRIG 1000ML POUR BTL (IV SOLUTION) ×2 IMPLANT
PACK LAMINECTOMY NEURO (CUSTOM PROCEDURE TRAY) ×2 IMPLANT
PAD ARMBOARD 7.5X6 YLW CONV (MISCELLANEOUS) ×6 IMPLANT
PATTIES SURGICAL .5 X1 (DISPOSABLE) IMPLANT
RUBBERBAND STERILE (MISCELLANEOUS) ×4 IMPLANT
SPONGE GAUZE 4X4 12PLY (GAUZE/BANDAGES/DRESSINGS) ×2 IMPLANT
SPONGE SURGIFOAM ABS GEL SZ50 (HEMOSTASIS) ×2 IMPLANT
STRIP CLOSURE SKIN 1/2X4 (GAUZE/BANDAGES/DRESSINGS) ×2 IMPLANT
SUT VIC AB 1 CT1 18XBRD ANBCTR (SUTURE) ×1 IMPLANT
SUT VIC AB 1 CT1 8-18 (SUTURE) ×1
SUT VIC AB 2-0 CP2 18 (SUTURE) ×2 IMPLANT
SYR 20CC LL (SYRINGE) IMPLANT
SYR 20ML ECCENTRIC (SYRINGE) ×2 IMPLANT
TAPE CLOTH SURG 4X10 WHT LF (GAUZE/BANDAGES/DRESSINGS) ×2 IMPLANT
TAPE STRIPS DRAPE STRL (GAUZE/BANDAGES/DRESSINGS) ×2 IMPLANT
TOWEL OR 17X24 6PK STRL BLUE (TOWEL DISPOSABLE) ×2 IMPLANT
TOWEL OR 17X26 10 PK STRL BLUE (TOWEL DISPOSABLE) ×2 IMPLANT
WATER STERILE IRR 1000ML POUR (IV SOLUTION) ×2 IMPLANT

## 2013-04-19 NOTE — Progress Notes (Signed)
Subjective:  Patient is alert and pleasant. He says his left leg feels better.  Objective: Vital signs in last 24 hours: Temp:  [98.4 F (36.9 C)-99.4 F (37.4 C)] 98.6 F (37 C) (06/05 1813) Pulse Rate:  [78-96] 78 (06/05 1813) Resp:  [16-18] 16 (06/05 1813) BP: (151-176)/(77-96) 176/96 mmHg (06/05 1813) SpO2:  [95 %-99 %] 99 % (06/05 1813) Weight:  [75.5 kg (166 lb 7.2 oz)] 75.5 kg (166 lb 7.2 oz) (06/05 0454)  Intake/Output from previous day: 06/04 0701 - 06/05 0700 In: 240 [P.O.:240] Out: 1375 [Urine:1375] Intake/Output this shift: Total I/O In: 350 [I.V.:350] Out: 300 [Urine:300]  Physical exam the patient is Glasgow Coma Scale 14. He is moving his lower extremities.  Lab Results:  Recent Labs  04/18/13 0505 04/19/13 0400  WBC 9.7 7.7  HGB 9.3* 9.5*  HCT 30.2* 30.4*  PLT 172 166   BMET  Recent Labs  04/18/13 0505 04/19/13 0400  NA 140 137  K 3.7 3.6  CL 100 99  CO2 27 27  GLUCOSE 142* 172*  BUN 45* 41*  CREATININE 3.05* 2.74*  CALCIUM 8.9 8.7    Studies/Results: No results found.  Assessment/Plan: The patient is doing well.  LOS: 10 days     Caffie Sotto D 04/19/2013, 6:45 PM

## 2013-04-19 NOTE — Preoperative (Signed)
Beta Blockers   Reason not to administer Beta Blockers:Not Applicable, took this AM 

## 2013-04-19 NOTE — Op Note (Signed)
Brief history: The patient is a 67 year old white male who had a L3-4 laminectomy and discectomy by Dr. Gerlene Fee about 4 weeks ago. The patient has developed recurrent back and left leg pain consistent with a lumbar radiculopathy. He was worked up with a lumbar myelo CT which demonstrated a large recurrent disc at L3-4. Surgery was discussed with the patient. The patient has weighed the risks, benefits, and alternatives surgery and decided proceed with surgery. Dr. Gerlene Fee went out of town today and the patient requested that I do his surgery.  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 Delma Officer  Asst.: None  Anesthesia: Gen. endotracheal  Estimated blood loss: 75 cc  Drains: None  Complications: None  Description of procedure: The patient was brought to the operating room by the anesthesia team. General endotracheal anesthesia was induced. The patient was turned to the prone position on the Wilson frame. The patient's lumbosacral region was then prepared with Betadine scrub and Betadine solution. Sterile drapes were applied.  I then injected the area to be incised with Marcaine with epinephrine solution. I then used a scalpel to make a linear midline incision over the L3-4intervertebral disc space, incising through the patient's fresh surgical scar. I then used electrocautery to perform a right sided subperiosteal dissection exposing the spinous process and lamina of L3, L4 and L5. We obtained intraoperative radiograph to confirm our location. I then inserted the [facility] retractor for exposure. I removed the epidural Gelfoam and expose the underlying dura at L3-4 on the left.  We  then brought the operative microscope into the field. Under its magnification and illumination we completed the microdissection.. I then used a Kerrison punches to widen the laminotomy at L3 . We then used microdissection to free up the thecal sac and the L4 nerve root from the epidural tissue. I then used a Kerrison punch to perform a foraminotomy at about the left L4 nerve root. We then using the nerve root retractor to gently retract the thecal sac and the left L4 nerve root medially. This exposed the intervertebral disc. There was a very large free fragment disc herniation which had migrated caudally from the L3-4 intervertebral disc and was compressing the L4 nerve root We identified the ruptured disc and remove it with the pituitary forceps. I inspected the intervertebral disc at L3-4. There was a large hole in the annulus. I perform a partial intervertebral discectomy using the pituitary forceps. I then dissected caudal to the L4 nerve root and encountered more herniated discs more caudally over the L4 vertebral body which was compressing the L5 nerve root. I used a high-speed drill and the Kerrison punch to remove the cephalad aspect of the L4 laminotomy to get better exposure of the L5 nerve root and the distal disc herniation. I then freed up the disc herniation using microdissection and remove it with the pituitary forceps decompressing the L5 nerve root on the left.  I then palpated along the ventral surface of the thecal sac and along exit route of the left L4 and L5 nerve root and noted that the neural structures were well decompressed. This completed the decompression.  We then obtained hemostasis using bipolar electrocautery. We irrigated the wound out with bacitracin solution. We then removed the retractor. We  then reapproximated the patient's thoracolumbar fascia with interrupted #1 Vicryl suture. We then reapproximated the patient's subcutaneous tissue with interrupted 3-0 Vicryl suture. We  then reapproximated patient's skin with Steri-Strips and benzoin. The was then coated with bacitracin ointment. The drapes were removed. The patient was subsequently returned to the supine position where they were extubated by the anesthesia team. The patient was then transported to the postanesthesia care unit in stable condition. All sponge instrument and needle counts were reportedly correct at the end of this case.

## 2013-04-19 NOTE — Progress Notes (Signed)
Patient ID: [name]  male  UJW:[id number]    DOB: 09-13-1946    DOA: 04/09/2013  PCP: [name], MD  Assessment/Plan: Principal Problem: SIRS (systemic inflammatory response syndrome) - resolved    Cellulitis of right lower extremity- improving    Renal failure (ARF), acute on chronic kidney disease stage 3  - Renal service following, on oral Lasix  Hx of decompressive lumbar laminectomy (L-3) May12, 2014 -?of epidural abscess as per CT scan of spine  -Neurosurgery following, surgery today, appreciate Dr. 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  -IV iron this admit   Positive D dimer  -VQ scan with VERY low probability for PE   History of renal transplant  -cont Prednisone and Cellcept for now  -Nephrology following   Acute respiratory failure with hypoxia - resolved  PVD  - pt endorsed vascular studies 4 months ago at [facility]   HTN (hypertension)  -BP controlled   Diabetes mellitus type 2, controlled/hypoglycemia  -Levemir at home on hold, CBG much improved at this time   History of atrial fib  -occurred March 2013- pt refused anti coagulation   RUE edema  - had IV in same arm and if edema persists or worsens despite diuiresis may need venous duplex to r/o DVT  -currently decreasing with elevation and diuresis   Diarrhea: possibly related to colchicine and antibiotics. c diff pcr negative, stool studies neg  DVT Prophylaxis:  Code  Status:  Disposition: Inpatient rehabilitation when medically stable    Subjective: Patient frustrated with ongoing issues, especially needing another neurosurgery, happy that he is cleared by cardiology for the surgery  Objective: Weight change: -3.7 kg (-8 lb 2.5 oz)  Intake/Output Summary (Last 24 hours) at 04/19/13 1248 Last data filed at 04/19/13 1100  Gross per 24 hour  Intake    120 ml  Output   1450 ml  Net  -1330 ml   Blood pressure 173/94, pulse 86, temperature 99.3 F (37.4 C), temperature source Oral, resp. rate 18, height 6\' 1"  (1.854 m), weight 75.5 kg (166 lb 7.2 oz), SpO2 96.00%.  Physical Exam: General: Alert and awake, oriented x3, not in any acute distress. CVS: S1-S2 clear, no murmur rubs or gallops Chest: clear to auscultation bilaterally, no wheezing, rales or rhonchi Abdomen: soft nontender, nondistended, normal bowel sounds  Extremities: no cyanosis, clubbing , mild edema noted bilaterally Neuro: Cranial nerves II-XII intact, no focal neurological deficits  Lab Results: Basic Metabolic Panel:  Recent Labs Lab 04/16/13 0535  04/18/13 0505 04/19/13 0400  NA 139  < > 140 137  K 4.1  < > 3.7 3.6  CL 97  < > 100 99  CO2 26  < > 27 27  GLUCOSE 100*  < > 142* 172*  BUN 51*  < > 45* 41*  CREATININE 3.07*  < > 3.05* 2.74*  CALCIUM 8.6  < > 8.9 8.7  MG 1.7  --   --   --   PHOS 4.7*  --   --   --   < > =  values in this interval not displayed. Liver Function Tests:  Recent Labs Lab 04/13/13 0543  04/16/13 0535 04/19/13 0400  AST 24  --   --  15  ALT 22  --   --  23  ALKPHOS 83  --   --  88  BILITOT 0.2*  --   --  0.3  PROT 4.9*  --   --  5.2*  ALBUMIN 1.9*  < > 2.2* 2.6*  < > = values in this interval not displayed. No results found for this basename: LIPASE, AMYLASE,  in the last 168 hours No results found for this basename: AMMONIA,  in the last 168 hours CBC:  Recent Labs Lab 04/15/13 1015 04/18/13 0505 04/19/13 0400  WBC 7.2 9.7  7.7  NEUTROABS 5.9  --   --   HGB 8.9* 9.3* 9.5*  HCT 27.9* 30.2* 30.4*  MCV 92.4 93.5 94.4  PLT 122* 172 166   Cardiac Enzymes: No results found for this basename: CKTOTAL, CKMB, CKMBINDEX, TROPONINI,  in the last 168 hours BNP: No components found with this basename: POCBNP,  CBG:  Recent Labs Lab 04/18/13 1123 04/18/13 1620 04/18/13 2059 04/19/13 0606 04/19/13 1142  GLUCAP 126* 168* 167* 170* 110*     Micro Results: Recent Results (from the past 240 hour(s))  CULTURE, BLOOD (ROUTINE X 2)     Status: None   Collection Time    04/09/13  6:45 PM      Result Value Range Status   Specimen Description BLOOD RIGHT ARM   Final   Special Requests BOTTLES DRAWN AEROBIC ONLY [facility]   Final   Culture  Setup Time 04/10/2013 01:04   Final   Culture NO GROWTH 5 DAYS   Final   Report Status 04/16/2013 FINAL   Final  CULTURE, BLOOD (ROUTINE X 2)     Status: None   Collection Time    04/09/13  6:50 PM      Result Value Range Status   Specimen Description BLOOD RIGHT WRIST   Final   Special Requests BOTTLES DRAWN AEROBIC ONLY 2[facility]   Final   Culture  Setup Time 04/10/2013 01:04   Final   Culture NO GROWTH 5 DAYS   Final   Report Status 04/16/2013 FINAL   Final  URINE CULTURE     Status: None   Collection Time    04/09/13  7:44 PM      Result Value Range Status   Specimen Description URINE, CATHETERIZED   Final   Special Requests NONE   Final   Culture  Setup Time 04/09/2013 20:44   Final   Colony Count NO GROWTH   Final   Culture NO GROWTH   Final   Report Status 04/10/2013 FINAL   Final  MRSA PCR SCREENING     Status: None   Collection Time    04/10/13 12:04 AM      Result Value Range Status   MRSA by PCR NEGATIVE  NEGATIVE Final   Comment:            The GeneXpert MRSA Assay (FDA     approved for NASAL specimens     only), is one component of a     comprehensive MRSA colonization     surveillance program. It is not     intended to diagnose MRSA     infection nor to  guide or     monitor treatment for     MRSA infections.  CLOSTRIDIUM  DIFFICILE BY PCR     Status: None   Collection Time    04/16/13  2:38 PM      Result Value Range Status   C difficile by pcr NEGATIVE  NEGATIVE Final  STOOL CULTURE     Status: None   Collection Time    04/16/13  2:38 PM      Result Value Range Status   Specimen Description STOOL   Final   Special Requests NONE   Final   Culture     Final   Value: NO SUSPICIOUS COLONIES, CONTINUING TO HOLD     Note: REDUCED NORMAL FLORA PRESENT   Report Status PENDING   Incomplete    Studies/Results: Dg Chest 2 View  03/23/2013   *RADIOLOGY REPORT*  Clinical Data: Preop evaluation for lumbar spine surgery diabetes, hypertension, pacemaker  CHEST - 2 VIEW  Comparison: 07/27/2012  Findings: Cardiomegaly noted without CHF or pneumonia.  No effusion or pneumothorax.  Left subclavian pacer evident.  Previous right PICC line removed. Atherosclerotic calcifications of the aorta  IMPRESSION: Cardiomegaly without CHF or pneumonia   Original Report Authenticated By: Judie Petit. Miles Costain, M.D.   Dg Lumbar Spine 2-3 Views  03/26/2013   *RADIOLOGY REPORT*  Clinical Data: Lumbar laminectomy  LUMBAR SPINE - 2-3 VIEW  Comparison: 05/04 12/04/2012  Findings: The first intraoperative lumbar radiograph shows a needle projecting between the L2 and L3 spinous processes.  Second film demonstrates surgical instruments projecting posterior to the   L3- 4 interspace.  IMPRESSION:  Intraoperative localization   Original Report Authenticated By: D. Andria Rhein, MD   Dg Hip Complete Left  04/16/2013   *RADIOLOGY REPORT*  Clinical Data: Fall.  Left hip and knee pain.  LEFT HIP - COMPLETE 2+ VIEW  Comparison: None.  Findings: Pelvic rings appear intact.  Right hip hemiarthroplasty. Atherosclerosis.  No displaced pelvic fracture.  Left hip chondrocalcinosis.  No left hip fracture.  No significant joint space narrowing.  IMPRESSION: No acute osseous abnormality.  Chondrocalcinosis  of the left hip, which may be associated with CPPD arthropathy or senile.   Original Report Authenticated By: Andreas Newport, M.D.   Ct Lumbar Spine Wo Contrast  04/10/2013   *RADIOLOGY REPORT*  Clinical Data: Confusion with shortness of breath and fever following L3 decompression 03/26/2013.  Question osteomyelitis.  CT LUMBAR SPINE WITHOUT CONTRAST  Technique:  Multidetector CT imaging of the lumbar spine was performed without intravenous contrast administration. Multiplanar CT image reconstructions were also generated.  Comparison: Intraoperative radiographs 03/26/2013, lumbar myelogram CT 02/28/2013 and CT urogram 05/19/2008.  Findings: There are interval postsurgical changes at L3-L4 status post laminectomy and discectomy.  The spinal canal appears adequately decompressed posteriorly.  There is persistent annular disc bulging eccentric to the left with associated vacuum phenomenon.  There is persistent left foraminal stenosis and possible exiting L3 nerve root encroachment. Lesser foraminal narrowing is present on the right.  In addition, there is new inferior extension of high density and air within the anterior epidural space asymmetric to the left. This exerts mild mass effect on the thecal sac which is displaced posteriorly.  No anterior paraspinal inflammatory changes or fluid collections are identified.  There is no endplate destruction to suggest osteomyelitis.  Postsurgical changes are noted status post right renal transplant. 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  The native right kidney demonstrates moderate hydronephrosis, new from 2009 and suboptimally visualized on more recent studies. A calcified gallstone is again noted.  There is stable mild disc bulging at L1-L2 and L2-L3.  No spinal stenosis or nerve root encroachment results.  L3-L4: As above.  L4-L5:  Similar annular disc bulging with a small left paracentral disc protrusion and bilateral facet  hypertrophy.  There is mild resulting left lateral recess stenosis.  No foraminal compromise or exiting L4 nerve root encroachment is identified.  L5-S1:  Stable annular disc bulging, central disc protrusion and associated vacuum phenomenon.  No sacral nerve root displacement or significant foraminal compromise is identified.  There is stable mild bilateral facet hypertrophy.  IMPRESSION:  1.  Interval postsurgical changes at L3-L4. An epidural air fluid collection extends inferiorly from the disc space behind the L4 vertebral body and exerts mass effect on the thecal sac.  As this appears contiguous with the L3-L4 disc which demonstrates vacuum phenomenon, this may reflect an extruded disc fragment.  I cannot exclude an epidural abscess, although no generalized inflammatory changes are identified. There is persistent foraminal narrowing at L3-L4, worse on the left. 2.  No CT evidence of osteomyelitis. 3.  Stable disc protrusions at L4-L5 and L5-S1. 4.  Apparent hydronephrosis of the native right renal collecting system of uncertain significance and etiology.   Original Report Authenticated By: Carey Bullocks, M.D.   Ct Lumbar Spine W Contrast  04/17/2013   *RADIOLOGY REPORT*  Clinical Data: Left leg pain post surgery.  LUMBAR MYELOGRAM AND POST MYELOGRAM CT  MYELOGRAM  LUMBAR  Technique: Order reviewed. As the patient has acute on top of chronic renal insufficiency, the need for the case was discussed with Dr. Gerlene Fee.  Dr. Randell Loop who is following the patient (nephrology service) agreed to low volume intrathecal contrast. Any available prior imaging reviewed.  The procedure and associated risks were reviewed with the patient (including but not limited to headache, bleeding, infection, contrast reaction, nondiagnostic study, nerve damage, all of which may require treatment).  All questions were answered.  Written and oral witnessed consent was obtained.  Time out performed.  Under sterile technique, a  lumbar  puncture was performed with a L2- 3 pass of a 22g needle.  Clear CSF was noted. 8cc of Omnipaque 180 was instilled.   Lumbar myelogram fluoroscopic images were obtained.  Post procedure instructions were reviewed with the patient.  Fluoroscopy time:  48 seconds  Comparison: 04/11/2011 CT and preoperative post myelogram 03/16/2011 CT.  Findings: Dextroscoliosis lumbar spine.  L3-4 disc space narrowing greater on the left.  Impression upon the left lateral aspect of the thecal sac at the L3-4 level compressing the left L4 nerve root.  IMPRESSION: Dextroscoliosis lumbar spine.  L3-4 disc space narrowing greater on the left.  Impression upon the left lateral aspect of the thecal sac at the L3- 4 level compressing the left L4 nerve root.  Please see below for further detail.  CT MYELOGRAPHY LUMBAR SPINE  Technique: CT imaging of the lumbar spine was performed after intrathecal contrast administration.  Multiplanar CT image reconstructions were also generated.  Findings:  Last fully open disc space is labeled L5-S1.  Present examination incorporates from T12-L1 through the mid sacrum.  Atrophic kidneys more notable on the left.  Arising from the lateral aspect of the left kidney is a 3.2 cm mass which has fatty components.  On the 2009 CT, this measured 5.4 cm (appearing more fatty on the remote exam) and possibly represents treated mass or involuting angiomyolipoma. Clinical correlation recommended.  This is incompletely assessed on present exam.  Hydronephrotic right kidney.  Prominent atherosclerotic type changes of the aorta.  Small bulge L3 level measures 2.4 cm whereas just above this level aorta measures 2.1 cm.  Narrowing of the renal arteries and less so involving the iliac arteries, superior mesenteric and celiac artery.  Conus L1 level.  Dextroscoliosis lumbar spine with disc space narrowing most notable on the left at the L3-4 level.  T12-L1:  Negative.  L1-2:  Negative.  L2-3:  Minimal facet joint  degenerative changes.  L3-4:  Prominent disc degeneration with disc space narrowing more notable on the left.  Recent laminectomy/partial facetectomy. Facet joint degenerative changes.  Broad-based calcified disc protrusion extending into the neural foramen with prominent bilateral foraminal narrowing and mass effect upon the exiting L3 nerve roots greater on the left. Postsurgical changes surrounding narrowed thecal sac further contributing to moderate to marked thecal sac narrowing.  Additionally, extending caudally from the left posterior lateral aspect of the disc space is a large soft tissue and air containing collection which may represent extruded disc material reaching the L4-5 disc space and causing mass effect upon the thecal sac and left L4 nerve roots.  Epidural abscess not entirely excluded in the proper clinical setting although a secondary consideration.  This has increased in size since the prior postoperative examination.  L4-5:  Facet joint degenerative changes.  Prior left hemilaminectomy.  Bulge/broad-based protrusion.  Facet joint degenerative changes.  Ligamentum flavum hypertrophy.  Mild to moderate left-sided and mild right-sided lateral recess stenosis. Mild spinal stenosis.  Mild bilateral foraminal narrowing.  L5-S1:  Facet joint degenerative changes.  Disc degeneration with broad-based slightly caudally extending disc protrusion which touches but does not cause significant mass effect upon the S1 nerve roots or thecal sac.  Lateral extension greater to the right with mild encroaching upon the exiting right L5 nerve root.  IMPRESSION: L3-4 recent laminectomy/partial facetectomy. Broad-based calcified disc protrusion extending into the neural foramen with prominent bilateral foraminal narrowing and mass effect upon the exiting L3 nerve roots greater on the left. Postsurgical changes surrounding narrowed thecal sac further contributing to moderate to marked thecal sac narrowing.   Additionally, extending caudally from the left posterior lateral aspect of the disc space is a large soft tissue and air containing collection which may represent extruded disc material reaching the L4-5 disc space and causing mass effect upon the thecal sac and left L4 nerve roots.  Epidural abscess not entirely excluded in the proper clinical setting although a secondary consideration.  This has increased in size since the prior postoperative examination.  L4-5 multifactorial mild to moderate left-sided and mild right- sided lateral recess stenosis.  Mild spinal stenosis.  Mild bilateral foraminal narrowing.  L5-S broad-based slightly caudally extending disc protrusion which touches but does not cause significant mass effect upon the S1 nerve roots or thecal sac.  Lateral extension greater to the right with mild encroaching upon the exiting right L5 nerve root.  Atrophic kidneys more notable on the left.  Arising from the lateral aspect of the left kidney is a 3.2 cm mass which has fatty components.  On the 2009 CT, this measured 5.4 cm (appearing more fatty on the remote exam) and possibly represents treated mass or involuting angiomyolipoma. Clinical correlation recommended.  This is incompletely assessed on present exam.  Hydronephrotic right kidney.  Prominent atherosclerotic type changes of the aorta.  Small bulge L3 level measures 2.4 cm whereas just above this level aorta measures 2.1 cm.  Narrowing of the renal arteries and less so involving  the iliac arteries, superior mesenteric and celiac artery.  This has been made a PRA call report utilizing dashboard call feature.   Original Report Authenticated By: Lacy Duverney, M.D.   Ct Knee Right Wo Contrast  04/10/2013   *RADIOLOGY REPORT*  Clinical Data: Knee swelling and fever.  Question osteomyelitis.  CT OF THE RIGHT KNEE WITHOUT CONTRAST  Technique:  Multidetector CT imaging was performed according to the standard protocol. Multiplanar CT image  reconstructions were also generated.  Comparison: None.  Findings: No bony destructive change or focal lesion is identified. Mild chondrocalcinosis is noted.  The patient has only a small joint effusion.  There is diffuse subcutaneous edema about the knee without focal subcutaneous fluid collection.  Mild degenerative change is seen in the medial compartment.  Atherosclerotic vascular disease is noted.  IMPRESSION:  1.  No CT evidence of osteomyelitis.  Please note that CT scan is not sensitive for the detection of osteomyelitis. 2.  Small joint effusion.  No focal fluid collection is present. 3.  Subcutaneous edema diffusely about the knee is likely due to dependent change or possibly cellulitis. 4.  Atherosclerosis.   Original Report Authenticated By: Holley Dexter, M.D.   Dg Myelogram Lumbar  04/17/2013   *RADIOLOGY REPORT*  Clinical Data: Left leg pain post surgery.  LUMBAR MYELOGRAM AND POST MYELOGRAM CT  MYELOGRAM  LUMBAR  Technique: Order reviewed. As the patient has acute on top of chronic renal insufficiency, the need for the case was discussed with Dr. Gerlene Fee.  Dr. Randell Loop who is following the patient (nephrology service) agreed to low volume intrathecal contrast. Any available prior imaging reviewed.  The procedure and associated risks were reviewed with the patient (including but not limited to headache, bleeding, infection, contrast reaction, nondiagnostic study, nerve damage, all of which may require treatment).  All questions were answered.  Written and oral witnessed consent was obtained.  Time out performed.  Under sterile technique, a  lumbar puncture was performed with a L2- 3 pass of a 22g needle.  Clear CSF was noted. 8cc of Omnipaque 180 was instilled.   Lumbar myelogram fluoroscopic images were obtained.  Post procedure instructions were reviewed with the patient.  Fluoroscopy time:  48 seconds  Comparison: 04/11/2011 CT and preoperative post myelogram 03/16/2011 CT.  Findings:  Dextroscoliosis lumbar spine.  L3-4 disc space narrowing greater on the left.  Impression upon the left lateral aspect of the thecal sac at the L3-4 level compressing the left L4 nerve root.  IMPRESSION: Dextroscoliosis lumbar spine.  L3-4 disc space narrowing greater on the left.  Impression upon the left lateral aspect of the thecal sac at the L3- 4 level compressing the left L4 nerve root.  Please see below for further detail.  CT MYELOGRAPHY LUMBAR SPINE  Technique: CT imaging of the lumbar spine was performed after intrathecal contrast administration.  Multiplanar CT image reconstructions were also generated.  Findings:  Last fully open disc space is labeled L5-S1.  Present examination incorporates from T12-L1 through the mid sacrum.  Atrophic kidneys more notable on the left.  Arising from the lateral aspect of the left kidney is a 3.2 cm mass which has fatty components.  On the 2009 CT, this measured 5.4 cm (appearing more fatty on the remote exam) and possibly represents treated mass or involuting angiomyolipoma. Clinical correlation recommended.  This is incompletely assessed on present exam.  Hydronephrotic right kidney.  Prominent atherosclerotic type changes of the aorta.  Small bulge L3 level measures 2.4 cm  whereas just above this level aorta measures 2.1 cm.  Narrowing of the renal arteries and less so involving the iliac arteries, superior mesenteric and celiac artery.  Conus L1 level.  Dextroscoliosis lumbar spine with disc space narrowing most notable on the left at the L3-4 level.  T12-L1:  Negative.  L1-2:  Negative.  L2-3:  Minimal facet joint degenerative changes.  L3-4:  Prominent disc degeneration with disc space narrowing more notable on the left.  Recent laminectomy/partial facetectomy. Facet joint degenerative changes.  Broad-based calcified disc protrusion extending into the neural foramen with prominent bilateral foraminal narrowing and mass effect upon the exiting L3 nerve roots greater  on the left. Postsurgical changes surrounding narrowed thecal sac further contributing to moderate to marked thecal sac narrowing.  Additionally, extending caudally from the left posterior lateral aspect of the disc space is a large soft tissue and air containing collection which may represent extruded disc material reaching the L4-5 disc space and causing mass effect upon the thecal sac and left L4 nerve roots.  Epidural abscess not entirely excluded in the proper clinical setting although a secondary consideration.  This has increased in size since the prior postoperative examination.  L4-5:  Facet joint degenerative changes.  Prior left hemilaminectomy.  Bulge/broad-based protrusion.  Facet joint degenerative changes.  Ligamentum flavum hypertrophy.  Mild to moderate left-sided and mild right-sided lateral recess stenosis. Mild spinal stenosis.  Mild bilateral foraminal narrowing.  L5-S1:  Facet joint degenerative changes.  Disc degeneration with broad-based slightly caudally extending disc protrusion which touches but does not cause significant mass effect upon the S1 nerve roots or thecal sac.  Lateral extension greater to the right with mild encroaching upon the exiting right L5 nerve root.  IMPRESSION: L3-4 recent laminectomy/partial facetectomy. Broad-based calcified disc protrusion extending into the neural foramen with prominent bilateral foraminal narrowing and mass effect upon the exiting L3 nerve roots greater on the left. Postsurgical changes surrounding narrowed thecal sac further contributing to moderate to marked thecal sac narrowing.  Additionally, extending caudally from the left posterior lateral aspect of the disc space is a large soft tissue and air containing collection which may represent extruded disc material reaching the L4-5 disc space and causing mass effect upon the thecal sac and left L4 nerve roots.  Epidural abscess not entirely excluded in the proper clinical setting although a  secondary consideration.  This has increased in size since the prior postoperative examination.  L4-5 multifactorial mild to moderate left-sided and mild right- sided lateral recess stenosis.  Mild spinal stenosis.  Mild bilateral foraminal narrowing.  L5-S broad-based slightly caudally extending disc protrusion which touches but does not cause significant mass effect upon the S1 nerve roots or thecal sac.  Lateral extension greater to the right with mild encroaching upon the exiting right L5 nerve root.  Atrophic kidneys more notable on the left.  Arising from the lateral aspect of the left kidney is a 3.2 cm mass which has fatty components.  On the 2009 CT, this measured 5.4 cm (appearing more fatty on the remote exam) and possibly represents treated mass or involuting angiomyolipoma. Clinical correlation recommended.  This is incompletely assessed on present exam.  Hydronephrotic right kidney.  Prominent atherosclerotic type changes of the aorta.  Small bulge L3 level measures 2.4 cm whereas just above this level aorta measures 2.1 cm.  Narrowing of the renal arteries and less so involving the iliac arteries, superior mesenteric and celiac artery.  This has been made a PRA  call report utilizing dashboard call feature.   Original Report Authenticated By: Lacy Duverney, M.D.   US Renal  04/12/2013   *RADIOLOGY REPORT*  Clinical Data: Kidney transplant with elevated creatinine. Evaluate for hydronephrosis. History of fluid overload.  RENAL/URINARY TRACT ULTRASOUND COMPLETE  Comparison:  Lumbar spine CT 04/10/2013 and abdominal CT 05/19/2008  Findings:  Right Kidney:  The native right kidney is small and echogenic.  The right kidney measures 7.7 cm. Difficult to exclude hydronephrosis in the native right kidney.  Cyst in the right kidney measures up to 2.5 cm.  Left Kidney:  Atrophic and echogenic.  Left kidney roughly measures 7.5 cm in length.  Lower pole is difficult to evaluate.  Transplant kidney: The  transplant kidney measures 12.4 cm in length.  There are hypoechoic cysts associated with the transplant kidney and a small amount of adjacent ascites.  Largest cyst roughly measures 2.7 cm.  Bladder:  Fluid filled and unremarkable.  Other findings:  There are bilateral pleural effusions. Evidence for an echogenic stone with posterior acoustic shadowing at the base of the gallbladder.  IMPRESSION: No significant hydronephrosis involving the transplant kidney. There appears to be cortical cysts involving the transplant kidney as described.  Bilateral native kidneys are atrophic and echogenic as expected. Evidence for bilateral renal cortical cysts. Difficult to exclude hydronephrosis of the native right kidney.  Bilateral pleural effusions and ascites.  Cholelithiasis.   Original Report Authenticated By: Richarda Overlie, M.D.   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  Perfusion images were obtained in multiple projections after intravenous injection of Tc-63m MAA.  Radiopharmaceuticals:  Tc-89m DTPA aerosol and 6 mCi Tc-52m MAA.  Comparison: None Correlation:  Chest radiograph 04/09/2013  Findings:  Ventilation:  Absent ventilation in left lower lobe.  Enlargement of cardiac silhouette.  Mildly diminished ventilation in lung apices.  Perfusion:  Cardiomegaly.  Diminished perfusion throughout left lower lobe suspect related to the enlargement of cardiac silhouette, less severe than accompanying ventilatory loss. Minimal diminished perfusion in the left upper lobe matching ventilatory findings. No additional segmental or subsegmental perfusion defects.  IMPRESSION: Very low probability for pulmonary  embolism.   Original Report Authenticated By: [name], M.D.   Dg Chest Port 1 View  04/11/2013   *RADIOLOGY REPORT*  Clinical Data: Shortness of breath, evaluate for volume overload  PORTABLE CHEST - 1 VIEW  Comparison: 04/09/2013; 07/27/2012  Findings: Grossly unchanged marked enlargement of the cardiac silhouette and mediastinal contours.  Stable positioning of support apparatus.  Chronic mild pulmonary venous congestion without frank evidence of edema.  Grossly unchanged bilateral medial basilar heterogeneous opacities.  No new focal airspace opacities.  No definite pleural effusion or pneumothorax.  Unchanged bones.  IMPRESSION:  Chronic findings of cardiomegaly and pulmonary venous congestion without definite evidence of acute superimposed worsening pulmonary edema.   Original Report Authenticated By: Tacey Ruiz, MD   Dg Chest Port 1 View  04/09/2013   *RADIOLOGY REPORT*  Clinical Data: Hypoglycemia and shortness of breath  PORTABLE CHEST - 1 VIEW  Comparison: Chest radiograph 03/23/2013  Findings: Stable enlarged cardiac silhouette.  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  There are low lung volumes and central venous congestion.  No pneumothorax.  Lung  bases poorly evaluated  IMPRESSION: Decreased lung volumes and central venous congestion. Cardiomegaly.   Original Report Authenticated By: Genevive Bi, M.D.    Medications: Scheduled Meds: . antiseptic oral rinse  15 mL Mouth Rinse BID  . aspirin EC  325 mg Oral Daily  . atorvastatin  40 mg Oral q1800  . carvedilol  12.5 mg Oral BID WC  . darbepoetin (ARANESP) injection - NON-DIALYSIS  100 mcg Subcutaneous Q Thu-1800  . enoxaparin (LOVENOX) injection  30 mg Subcutaneous Q24H  . famotidine  20 mg Oral Daily  . furosemide  80 mg Oral Daily  . hydrocerin   Topical BID  . insulin aspart  0-5 Units Subcutaneous QHS  . insulin aspart  0-9 Units Subcutaneous TID WC  . insulin detemir  10 Units Subcutaneous Daily  . mycophenolate  500 mg  Oral BID  . omega-3 acid ethyl esters  1 g Oral Daily  . predniSONE  5 mg Oral BID  . pyridOXINE  200 mg Oral Daily  . sodium chloride  3 mL Intravenous Q12H  . vitamin B-12  250 mcg Oral Daily  . vitamin C  500 mg Oral Daily      LOS: 10 days   RAI,RIPUDEEP M.D. [facility] 04/19/2013, 12:48 PM Pager: [phone]  If 7PM-7AM, please contact night-coverage [email or web address] Password [email or web address]

## 2013-04-19 NOTE — Progress Notes (Signed)
Patient ID: [name], male   DOB: 09-23-1946, 67 y.o.   MRN: [id number] S:pt without new complaints and feels comfortable to proceed with Dr. Lovell Sheehan for surgery later today  O:BP 173/94  Pulse 86  Temp(Src) 99.3 F (37.4 C) (Oral)  Resp 18  Ht 6\' 1"  (1.854 m)  Wt 75.5 kg (166 lb 7.2 oz)  BMI 21.96 kg/m2  SpO2 96%  Intake/Output Summary (Last 24 hours) at 04/19/13 0937 Last data filed at 04/19/13 0222  Gross per 24 hour  Intake    120 ml  Output   1375 ml  Net  -1255 ml   Intake/Output: I/O last 3 completed shifts: In: 240 [P.O.:240] Out: 2575 [Urine:2575]  Intake/Output this shift:    Weight change: -3.7 kg (-8 lb 2.5 oz) Gen:WD WN WM in NAD CVS:no rub Resp:cta WUJ:WJXBJY Ext:+1edema   Recent Labs Lab 04/13/13 0543 04/14/13 0435 04/16/13 0535 04/17/13 0405 04/18/13 0505 04/19/13 0400  NA 135 138 139 138 140 137  K 4.4 3.7 4.1 3.9 3.7 3.6  CL 100 100 97 97 100 99  CO2 21 23 26 28 27 27   GLUCOSE 165* 142* 100* 133* 142* 172*  BUN 62* 61* 51* 51* 45* 41*  CREATININE 2.93* 3.00* 3.07* 3.18* 3.05* 2.74*  ALBUMIN 1.9* 2.1* 2.2*  --   --  2.6*  CALCIUM 8.8 8.7 8.6 8.9 8.9 8.7  PHOS 5.6* 5.2* 4.7*  --   --   --   AST 24  --   --   --   --  15  ALT 22  --   --   --   --  23   Liver Function Tests:  Recent Labs Lab 04/13/13 0543 04/14/13 0435 04/16/13 0535 04/19/13 0400  AST 24  --   --  15  ALT 22  --   --  23  ALKPHOS 83  --   --  88  BILITOT 0.2*  --   --  0.3  PROT 4.9*  --   --  5.2*  ALBUMIN 1.9* 2.1* 2.2* 2.6*   No results found for this basename: LIPASE, AMYLASE,  in the last 168 hours No results found for this basename: AMMONIA,  in the last 168 hours CBC:  Recent Labs Lab 04/13/13 0543 04/14/13 0435 04/15/13 1015 04/18/13 0505 04/19/13 0400  WBC 8.0 5.5 7.2 9.7 7.7  NEUTROABS  --   --  5.9  --   --   HGB 8.2* 8.4* 8.9* 9.3* 9.5*  HCT 26.2* 26.5* 27.9* 30.2* 30.4*  MCV 93.2 92.0 92.4 93.5 94.4  PLT 96* 114* 122* 172 166    Cardiac Enzymes: No results found for this basename: CKTOTAL, CKMB, CKMBINDEX, TROPONINI,  in the last 168 hours CBG:  Recent Labs Lab 04/18/13 0615 04/18/13 1123 04/18/13 1620 04/18/13 2059 04/19/13 0606  GLUCAP 134* 126* 168* 167* 170*    Iron Studies: No results found for this basename: IRON, TIBC, TRANSFERRIN, FERRITIN,  in the last 72 hours Studies/Results: Ct Lumbar Spine W Contrast  04/17/2013   *RADIOLOGY REPORT*  Clinical Data: Left leg pain post surgery.  LUMBAR MYELOGRAM AND POST MYELOGRAM CT  MYELOGRAM  LUMBAR  Technique: Order reviewed. As the patient has acute on top of chronic renal insufficiency, the need for the case was discussed with Dr. Gerlene Fee.  Dr. Randell Loop who is following the patient (nephrology service) agreed to low volume intrathecal contrast. Any available prior imaging reviewed.  The procedure and associated risks were  reviewed with the patient (including but not limited to headache, bleeding, infection, contrast reaction, nondiagnostic study, nerve damage, all of which may require treatment).  All questions were answered.  Written and oral witnessed consent was obtained.  Time out performed.  Under sterile technique, a  lumbar puncture was performed with a L2- 3 pass of a 22g needle.  Clear CSF was noted. 8cc of Omnipaque 180 was instilled.   Lumbar myelogram fluoroscopic images were obtained.  Post procedure instructions were reviewed with the patient.  Fluoroscopy time:  48 seconds  Comparison: 04/11/2011 CT and preoperative post myelogram 03/16/2011 CT.  Findings: Dextroscoliosis lumbar spine.  L3-4 disc space narrowing greater on the left.  Impression upon the left lateral aspect of the thecal sac at the L3-4 level compressing the left L4 nerve root.  IMPRESSION: Dextroscoliosis lumbar spine.  L3-4 disc space narrowing greater on the left.  Impression upon the left lateral aspect of the thecal sac at the L3- 4 level compressing the left L4 nerve root.  Please  see below for further detail.  CT MYELOGRAPHY LUMBAR SPINE  Technique: CT imaging of the lumbar spine was performed after intrathecal contrast administration.  Multiplanar CT image reconstructions were also generated.  Findings:  Last fully open disc space is labeled L5-S1.  Present examination incorporates from T12-L1 through the mid sacrum.  Atrophic kidneys more notable on the left.  Arising from the lateral aspect of the left kidney is a 3.2 cm mass which has fatty components.  On the 2009 CT, this measured 5.4 cm (appearing more fatty on the remote exam) and possibly represents treated mass or involuting angiomyolipoma. Clinical correlation recommended.  This is incompletely assessed on present exam.  Hydronephrotic right kidney.  Prominent atherosclerotic type changes of the aorta.  Small bulge L3 level measures 2.4 cm whereas just above this level aorta measures 2.1 cm.  Narrowing of the renal arteries and less so involving the iliac arteries, superior mesenteric and celiac artery.  Conus L1 level.  Dextroscoliosis lumbar spine with disc space narrowing most notable on the left at the L3-4 level.  T12-L1:  Negative.  L1-2:  Negative.  L2-3:  Minimal facet joint degenerative changes.  L3-4:  Prominent disc degeneration with disc space narrowing more notable on the left.  Recent laminectomy/partial facetectomy. Facet joint degenerative changes.  Broad-based calcified disc protrusion extending into the neural foramen with prominent bilateral foraminal narrowing and mass effect upon the exiting L3 nerve roots greater on the left. Postsurgical changes surrounding narrowed thecal sac further contributing to moderate to marked thecal sac narrowing.  Additionally, extending caudally from the left posterior lateral aspect of the disc space is a large soft tissue and air containing collection which may represent extruded disc material reaching the L4-5 disc space and causing mass effect upon the thecal sac and left L4  nerve roots.  Epidural abscess not entirely excluded in the proper clinical setting although a secondary consideration.  This has increased in size since the prior postoperative examination.  L4-5:  Facet joint degenerative changes.  Prior left hemilaminectomy.  Bulge/broad-based protrusion.  Facet joint degenerative changes.  Ligamentum flavum hypertrophy.  Mild to moderate left-sided and mild right-sided lateral recess stenosis. Mild spinal stenosis.  Mild bilateral foraminal narrowing.  L5-S1:  Facet joint degenerative changes.  Disc degeneration with broad-based slightly caudally extending disc protrusion which touches but does not cause significant mass effect upon the S1 nerve roots or thecal sac.  Lateral extension greater to the right  with mild encroaching upon the exiting right L5 nerve root.  IMPRESSION: L3-4 recent laminectomy/partial facetectomy. Broad-based calcified disc protrusion extending into the neural foramen with prominent bilateral foraminal narrowing and mass effect upon the exiting L3 nerve roots greater on the left. Postsurgical changes surrounding narrowed thecal sac further contributing to moderate to marked thecal sac narrowing.  Additionally, extending caudally from the left posterior lateral aspect of the disc space is a large soft tissue and air containing collection which may represent extruded disc material reaching the L4-5 disc space and causing mass effect upon the thecal sac and left L4 nerve roots.  Epidural abscess not entirely excluded in the proper clinical setting although a secondary consideration.  This has increased in size since the prior postoperative examination.  L4-5 multifactorial mild to moderate left-sided and mild right- sided lateral recess stenosis.  Mild spinal stenosis.  Mild bilateral foraminal narrowing.  L5-S broad-based slightly caudally extending disc protrusion which touches but does not cause significant mass effect upon the S1 nerve roots or thecal  sac.  Lateral extension greater to the right with mild encroaching upon the exiting right L5 nerve root.  Atrophic kidneys more notable on the left.  Arising from the lateral aspect of the left kidney is a 3.2 cm mass which has fatty components.  On the 2009 CT, this measured 5.4 cm (appearing more fatty on the remote exam) and possibly represents treated mass or involuting angiomyolipoma. Clinical correlation recommended.  This is incompletely assessed on present exam.  Hydronephrotic right kidney.  Prominent atherosclerotic type changes of the aorta.  Small bulge L3 level measures 2.4 cm whereas just above this level aorta measures 2.1 cm.  Narrowing of the renal arteries and less so involving the iliac arteries, superior mesenteric and celiac artery.  This has been made a PRA call report utilizing dashboard call feature.   Original Report Authenticated By: Lacy Duverney, M.D.   Dg Myelogram Lumbar  04/17/2013   *RADIOLOGY REPORT*  Clinical Data: Left leg pain post surgery.  LUMBAR MYELOGRAM AND POST MYELOGRAM CT  MYELOGRAM  LUMBAR  Technique: Order reviewed. As the patient has acute on top of chronic renal insufficiency, the need for the case was discussed with Dr. Gerlene Fee.  Dr. Randell Loop who is following the patient (nephrology service) agreed to low volume intrathecal contrast. Any available prior imaging reviewed.  The procedure and associated risks were reviewed with the patient (including but not limited to headache, bleeding, infection, contrast reaction, nondiagnostic study, nerve damage, all of which may require treatment).  All questions were answered.  Written and oral witnessed consent was obtained.  Time out performed.  Under sterile technique, a  lumbar puncture was performed with a L2- 3 pass of a 22g needle.  Clear CSF was noted. 8cc of Omnipaque 180 was instilled.   Lumbar myelogram fluoroscopic images were obtained.  Post procedure instructions were reviewed with the patient.  Fluoroscopy time:   48 seconds  Comparison: 04/11/2011 CT and preoperative post myelogram 03/16/2011 CT.  Findings: Dextroscoliosis lumbar spine.  L3-4 disc space narrowing greater on the left.  Impression upon the left lateral aspect of the thecal sac at the L3-4 level compressing the left L4 nerve root.  IMPRESSION: Dextroscoliosis lumbar spine.  L3-4 disc space narrowing greater on the left.  Impression upon the left lateral aspect of the thecal sac at the L3- 4 level compressing the left L4 nerve root.  Please see below for further detail.  CT MYELOGRAPHY LUMBAR SPINE  Technique: CT imaging of the lumbar spine was performed after intrathecal contrast administration.  Multiplanar CT image reconstructions were also generated.  Findings:  Last fully open disc space is labeled L5-S1.  Present examination incorporates from T12-L1 through the mid sacrum.  Atrophic kidneys more notable on the left.  Arising from the lateral aspect of the left kidney is a 3.2 cm mass which has fatty components.  On the 2009 CT, this measured 5.4 cm (appearing more fatty on the remote exam) and possibly represents treated mass or involuting angiomyolipoma. Clinical correlation recommended.  This is incompletely assessed on present exam.  Hydronephrotic right kidney.  Prominent atherosclerotic type changes of the aorta.  Small bulge L3 level measures 2.4 cm whereas just above this level aorta measures 2.1 cm.  Narrowing of the renal arteries and less so involving the iliac arteries, superior mesenteric and celiac artery.  Conus L1 level.  Dextroscoliosis lumbar spine with disc space narrowing most notable on the left at the L3-4 level.  T12-L1:  Negative.  L1-2:  Negative.  L2-3:  Minimal facet joint degenerative changes.  L3-4:  Prominent disc degeneration with disc space narrowing more notable on the left.  Recent laminectomy/partial facetectomy. Facet joint degenerative changes.  Broad-based calcified disc protrusion extending into the neural foramen  with prominent bilateral foraminal narrowing and mass effect upon the exiting L3 nerve roots greater on the left. Postsurgical changes surrounding narrowed thecal sac further contributing to moderate to marked thecal sac narrowing.  Additionally, extending caudally from the left posterior lateral aspect of the disc space is a large soft tissue and air containing collection which may represent extruded disc material reaching the L4-5 disc space and causing mass effect upon the thecal sac and left L4 nerve roots.  Epidural abscess not entirely excluded in the proper clinical setting although a secondary consideration.  This has increased in size since the prior postoperative examination.  L4-5:  Facet joint degenerative changes.  Prior left hemilaminectomy.  Bulge/broad-based protrusion.  Facet joint degenerative changes.  Ligamentum flavum hypertrophy.  Mild to moderate left-sided and mild right-sided lateral recess stenosis. Mild spinal stenosis.  Mild bilateral foraminal narrowing.  L5-S1:  Facet joint degenerative changes.  Disc degeneration with broad-based slightly caudally extending disc protrusion which touches but does not cause significant mass effect upon the S1 nerve roots or thecal sac.  Lateral extension greater to the right with mild encroaching upon the exiting right L5 nerve root.  IMPRESSION: L3-4 recent laminectomy/partial facetectomy. Broad-based calcified disc protrusion extending into the neural foramen with prominent bilateral foraminal narrowing and mass effect upon the exiting L3 nerve roots greater on the left. Postsurgical changes surrounding narrowed thecal sac further contributing to moderate to marked thecal sac narrowing.  Additionally, extending caudally from the left posterior lateral aspect of the disc space is a large soft tissue and air containing collection which may represent extruded disc material reaching the L4-5 disc space and causing mass effect upon the thecal sac and left  L4 nerve roots.  Epidural abscess not entirely excluded in the proper clinical setting although a secondary consideration.  This has increased in size since the prior postoperative examination.  L4-5 multifactorial mild to moderate left-sided and mild right- sided lateral recess stenosis.  Mild spinal stenosis.  Mild bilateral foraminal narrowing.  L5-S broad-based slightly caudally extending disc protrusion which touches but does not cause significant mass effect upon the S1 nerve roots or thecal sac.  Lateral extension greater to the right with mild  encroaching upon the exiting right L5 nerve root.  Atrophic kidneys more notable on the left.  Arising from the lateral aspect of the left kidney is a 3.2 cm mass which has fatty components.  On the 2009 CT, this measured 5.4 cm (appearing more fatty on the remote exam) and possibly represents treated mass or involuting angiomyolipoma. Clinical correlation recommended.  This is incompletely assessed on present exam.  Hydronephrotic right kidney.  Prominent atherosclerotic type changes of the aorta.  Small bulge L3 level measures 2.4 cm whereas just above this level aorta measures 2.1 cm.  Narrowing of the renal arteries and less so involving the iliac arteries, superior mesenteric and celiac artery.  This has been made a PRA call report utilizing dashboard call feature.   Original Report Authenticated By: Lacy Duverney, M.D.   . antiseptic oral rinse  15 mL Mouth Rinse BID  . aspirin EC  325 mg Oral Daily  . atorvastatin  40 mg Oral q1800  . carvedilol  12.5 mg Oral BID WC  . darbepoetin (ARANESP) injection - NON-DIALYSIS  100 mcg Subcutaneous Q Thu-1800  . enoxaparin (LOVENOX) injection  30 mg Subcutaneous Q24H  . famotidine  20 mg Oral Daily  . furosemide  80 mg Oral Daily  . hydrocerin   Topical BID  . insulin aspart  0-5 Units Subcutaneous QHS  . insulin aspart  0-9 Units Subcutaneous TID WC  . insulin detemir  10 Units Subcutaneous Daily  .  mycophenolate  500 mg Oral BID  . omega-3 acid ethyl esters  1 g Oral Daily  . predniSONE  5 mg Oral BID  . pyridOXINE  200 mg Oral Daily  . sodium chloride  3 mL Intravenous Q12H  . vitamin B-12  250 mcg Oral Daily  . vitamin C  500 mg Oral Daily    BMET    Component Value Date/Time   NA 137 04/19/2013 0400   K 3.6 04/19/2013 0400   CL 99 04/19/2013 0400   CO2 27 04/19/2013 0400   GLUCOSE 172* 04/19/2013 0400   BUN 41* 04/19/2013 0400   CREATININE 2.74* 04/19/2013 0400   CALCIUM 8.7 04/19/2013 0400   GFRNONAA 23* 04/19/2013 0400   GFRAA 27* 04/19/2013 0400   CBC    Component Value Date/Time   WBC 7.7 04/19/2013 0400   RBC 3.22* 04/19/2013 0400   HGB 9.5* 04/19/2013 0400   HCT 30.4* 04/19/2013 0400   PLT 166 04/19/2013 0400   MCV 94.4 04/19/2013 0400   MCH 29.5 04/19/2013 0400   MCHC 31.3 04/19/2013 0400   RDW 17.8* 04/19/2013 0400   LYMPHSABS 0.9 04/15/2013 1015   MONOABS 0.4 04/15/2013 1015   EOSABS 0.1 04/15/2013 1015   BASOSABS 0.0 04/15/2013 1015     Assessment/Plan:  1. 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 AKI likely related to aggressive diuresis and ongoing diarrhea. Switched over to po lasix and continues with significant diuresis. Stopped pm dose of lasix. S/p CT myelogram without change in Scr. Cont to follow.  2. CHF- markedly improved volume status. EF 30-35%. On lower dose of po lasix (will hold pm dose today due to rise in Scr). Continues to diurese 3. Pericardial effusion without tamponade. Agree with repeat ECHO 4. Left hip pain- CT myelogram revealed soft tissue mass in L4-5 territory (possible extruded material from disk impinging upon the thecal sac.  1. For surgery later today with Dr. Lovell Sheehan of Neurosurgery. 5. DM-  stable 6. Diarrhea/Nausea- may benefit from immodium or lomotil. Likely related to ongoing abx. Consider probiotics.  7. H/o Vfib arrest, s/p AICD 8. Immunosuppressive therapy- cont with MMF and pred 9. Cellulitis- off Abx with  improvement of diarrhea (completed 1 week of IV zosyn) 10. Anemia- normocytic. May benefit from ESA. Will cont to follow 11. Dispo- will need CIR following surgery  Raphel Stickles A

## 2013-04-19 NOTE — Progress Notes (Signed)
Subjective:  The patient is alert and pleasant. He continues to complain of left buttock and leg pain.  Objective: Vital signs in last 24 hours: Temp:  [98.4 F (36.9 C)-99.4 F (37.4 C)] 98.4 F (36.9 C) (06/05 1351) Pulse Rate:  [84-96] 84 (06/05 1351) Resp:  [18] 18 (06/05 1351) BP: (151-173)/(77-94) 151/77 mmHg (06/05 1351) SpO2:  [95 %-97 %] 95 % (06/05 1351) Weight:  [75.5 kg (166 lb 7.2 oz)] 75.5 kg (166 lb 7.2 oz) (06/05 0454)  Intake/Output from previous day: 06/04 0701 - 06/05 0700 In: 240 [P.O.:240] Out: 1375 [Urine:1375] Intake/Output this shift: Total I/O In: -  Out: 300 [Urine:300]  Physical exam he is moving his lower extremities well.  Lab Results:  Recent Labs  04/18/13 0505 04/19/13 0400  WBC 9.7 7.7  HGB 9.3* 9.5*  HCT 30.2* 30.4*  PLT 172 166   BMET  Recent Labs  04/18/13 0505 04/19/13 0400  NA 140 137  K 3.7 3.6  CL 100 99  CO2 27 27  GLUCOSE 142* 172*  BUN 45* 41*  CREATININE 3.05* 2.74*  CALCIUM 8.9 8.7    Studies/Results: No results found.  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 I have answered all his questions regarding surgery. He has weighed the risks, benefits, and alternatives surgery and decided proceed with a redo left L3-4 discectomy.  LOS: 10 days     Jenella Craigie D 04/19/2013, 3:41 PM

## 2013-04-19 NOTE — Transfer of Care (Signed)
Immediate Anesthesia Transfer of Care Note  Patient: [name]  Procedure(s) Performed: Procedure(s) with comments: Redo Lumbar Three to Four Decompression One LEVEL (N/A) - Redo Lumbar Three to Four Decompression One LEVEL  Patient Location: PACU  Anesthesia Type:General  Level of Consciousness: awake, alert , oriented, patient cooperative and responds to stimulation  Airway & Oxygen Therapy: Patient Spontanous Breathing and Patient connected to nasal cannula oxygen  Post-op Assessment: Report given to PACU RN, Post -op Vital signs reviewed and stable and Patient moving all extremities X 4  Post vital signs: Reviewed and stable  Complications: No apparent anesthesia complications

## 2013-04-19 NOTE — Anesthesia Preprocedure Evaluation (Addendum)
Anesthesia Evaluation  Patient identified by MRN, date of birth, ID band Patient awake    Reviewed: Allergy & Precautions, H&P , NPO status , Patient's Chart, lab work & pertinent test results, reviewed documented beta blocker date and time   Airway Mallampati: III TM Distance: >3 FB Neck ROM: full  Mouth opening: Limited Mouth Opening  Dental  (+) Teeth Intact, Dental Advisory Given and Caps   Pulmonary pneumonia -, resolved,  breath sounds clear to auscultation        Cardiovascular hypertension, Pt. on medications and Pt. on home beta blockers + CAD, + Past MI and +CHF + dysrhythmias Atrial Fibrillation + pacemaker + Cardiac Defibrillator Rhythm:regular     Neuro/Psych negative neurological ROS  negative psych ROS   GI/Hepatic negative GI ROS, Neg liver ROS,   Endo/Other  negative endocrine ROSdiabetes, Insulin Dependent  Renal/GU ARF, CRF and Renal InsufficiencyRenal diseasenegative Renal ROS  negative genitourinary   Musculoskeletal   Abdominal   Peds  Hematology negative hematology ROS (+)   Anesthesia Other Findings See surgeon's H&P   Reproductive/Obstetrics negative OB ROS                          Anesthesia Physical Anesthesia Plan  ASA: IV  Anesthesia Plan: General   Post-op Pain Management:    Induction: Intravenous  Airway Management Planned: Oral ETT  Additional Equipment: Arterial line  Intra-op Plan:   Post-operative Plan: Extubation in OR  Informed Consent: I have reviewed the patients History and Physical, chart, labs and discussed the procedure including the risks, benefits and alternatives for the proposed anesthesia with the patient or authorized representative who has indicated his/her understanding and acceptance.   Dental Advisory Given  Plan Discussed with: CRNA and Surgeon  Anesthesia Plan Comments:         Anesthesia Quick Evaluation

## 2013-04-19 NOTE — Anesthesia Postprocedure Evaluation (Signed)
  Anesthesia Post-op Note  Patient: [name]  Procedure(s) Performed: Procedure(s) with comments: Redo Lumbar Three to Four Decompression One LEVEL (N/A) - Redo Lumbar Three to Four Decompression One LEVEL  Patient Location: PACU  Anesthesia Type:General  Level of Consciousness: awake  Airway and Oxygen Therapy: Patient Spontanous Breathing and Patient connected to nasal cannula oxygen  Post-op Pain: mild  Post-op Assessment: Post-op Vital signs reviewed, Patient's Cardiovascular Status Stable, Respiratory Function Stable, Patent Airway and No signs of Nausea or vomiting  Post-op Vital Signs: Reviewed and stable  Complications: No apparent anesthesia complications

## 2013-04-19 NOTE — Progress Notes (Signed)
OT Cancellation Note  Patient Details Name: [name] MRN: [id number] DOB: 09-Jul-1946   Cancelled Treatment:    Reason Eval/Treat Not Completed: Medical issues which prohibited therapy;Patient at procedure or test/ unavailable (surgery. )  [facility] Jacion Dismore, OTR/L  [phone] 04/19/2013 04/19/2013, 12:17 PM

## 2013-04-19 NOTE — Progress Notes (Signed)
ICD turned on by Joey from [employer].

## 2013-04-19 NOTE — Anesthesia Procedure Notes (Signed)
Procedure Name: Intubation Date/Time: 04/19/2013 5:08 PM Performed by: Everlene Balls TODD Pre-anesthesia Checklist: Patient identified, Emergency Drugs available, Suction available, Patient being monitored and Timeout performed Patient Re-evaluated:Patient Re-evaluated prior to inductionOxygen Delivery Method: Circle system utilized Preoxygenation: Pre-oxygenation with 100% oxygen Intubation Type: IV induction Ventilation: Oral airway inserted - appropriate to patient size and Mask ventilation without difficulty Laryngoscope Size: Mac and 3 Grade View: Grade II Tube type: Oral Tube size: 7.5 mm Number of attempts: 1 Airway Equipment and Method: Stylet Placement Confirmation: ETT inserted through vocal cords under direct vision,  breath sounds checked- equal and bilateral,  positive ETCO2 and CO2 detector Secured at: 21 cm Tube secured with: Tape Dental Injury: Injury to lip  Comments: Pt w fragile skin secondary to immunosuppressive and steroid medications.  Upper L) Lip noted w skin tear s/p mask ventilation and intubation--lubricant applied.

## 2013-04-19 NOTE — Progress Notes (Signed)
Subjective:  The patient denies any chest discomfort or shortness of breath.  Vital signs have remained stable.  Diuresing well. Weight down to 166.  Objective:  Vital Signs in the last 24 hours: Temp:  [98.5 F (36.9 C)-99.4 F (37.4 C)] 99.3 F (37.4 C) (06/05 0518) Pulse Rate:  [86-96] 86 (06/05 0518) Resp:  [16-18] 18 (06/05 0518) BP: (152-173)/(74-94) 173/94 mmHg (06/05 0518) SpO2:  [95 %-97 %] 96 % (06/05 0518) Weight:  [166 lb 7.2 oz (75.5 kg)] 166 lb 7.2 oz (75.5 kg) (06/05 0454)  Intake/Output from previous day: 06/04 0701 - 06/05 0700 In: 240 [P.O.:240] Out: 1375 [Urine:1375] Intake/Output from this shift:    . antiseptic oral rinse  15 mL Mouth Rinse BID  . aspirin EC  325 mg Oral Daily  . atorvastatin  40 mg Oral q1800  . carvedilol  12.5 mg Oral BID WC  . darbepoetin (ARANESP) injection - NON-DIALYSIS  100 mcg Subcutaneous Q Thu-1800  . enoxaparin (LOVENOX) injection  30 mg Subcutaneous Q24H  . famotidine  20 mg Oral Daily  . furosemide  80 mg Oral Daily  . hydrocerin   Topical BID  . insulin aspart  0-5 Units Subcutaneous QHS  . insulin aspart  0-9 Units Subcutaneous TID WC  . insulin detemir  10 Units Subcutaneous Daily  . mycophenolate  500 mg Oral BID  . omega-3 acid ethyl esters  1 g Oral Daily  . predniSONE  5 mg Oral BID  . pyridOXINE  200 mg Oral Daily  . sodium chloride  3 mL Intravenous Q12H  . vitamin B-12  250 mcg Oral Daily  . vitamin C  500 mg Oral Daily      Physical Exam: The patient appears to be in no distress.  Head and neck exam reveals that the pupils are equal and reactive.  His eyes are somewhat proptotic but thyroid function is normal. The extraocular movements are full.  There is no scleral icterus.  Mouth and pharynx are benign.  No lymphadenopathy.  No carotid bruits.  The jugular venous pressure is normal.  Thyroid is not enlarged or tender.  Chest is clear to percussion and auscultation.  No rales or rhonchi.  Expansion of  the chest is symmetrical.  Heart reveals no abnormal lift or heave.  First and second heart sounds are normal.  There is no  gallop rub or click.  There is a grade 2/6 apical regurgitant murmur.  The abdomen is soft and nontender.  Bowel sounds are normoactive.  There is no hepatosplenomegaly or mass.  There are no abdominal bruits.  Extremities reveal mild peripheral edema.  Integument reveals mild lower extremity erythema  Lab Results:  Recent Labs  04/18/13 0505 04/19/13 0400  WBC 9.7 7.7  HGB 9.3* 9.5*  PLT 172 166    Recent Labs  04/18/13 0505 04/19/13 0400  NA 140 137  K 3.7 3.6  CL 100 99  CO2 27 27  GLUCOSE 142* 172*  BUN 45* 41*  CREATININE 3.05* 2.74*   No results found for this basename: TROPONINI, CK, MB,  in the last 72 hours Hepatic Function Panel  Recent Labs  04/19/13 0400  PROT 5.2*  ALBUMIN 2.6*  AST 15  ALT 23  ALKPHOS 88  BILITOT 0.3   No results found for this basename: CHOL,  in the last 72 hours No results found for this basename: PROTIME,  in the last 72 hours  Imaging: Imaging results have been reviewed.  Two-dimensional echocardiogram on 04/17/13: Impressions:  - There is a large circumferential pericardial effusion. My impression is that there may be slight decrease in size of the effusion since 04/12/2013. However, this is only an imprression.  Cardiac Studies: Telemetry shows paced ventricular rhythm.  No P waves seen. Assessment/Plan:  1. Large pericardial effusion.  No evidence of tamponade.  Echo yesterday unchanged or slightly smaller pericardial effusion. 2. Acute on chronic systolic congestive heart failure  3. Biventricular ICD placement  4. Chronic kidney disease with previous transplant  5. Diarrhea improving. 6. Nausea and indigestion, improved 7. left hip and left thigh pain.  Surgery by Dr. Lovell Sheehan planned for this afternoon.  Plan: The patient is stable from the cardiac standpoint to proceed with surgery    LOS: 10 days    Cassell Clement 04/19/2013, 7:59 AM

## 2013-04-20 ENCOUNTER — Encounter (HOSPITAL_COMMUNITY): Payer: Self-pay | Admitting: Neurosurgery

## 2013-04-20 LAB — GLUCOSE, CAPILLARY
Glucose-Capillary: 113 mg/dL — ABNORMAL HIGH (ref 70–99)
Glucose-Capillary: 153 mg/dL — ABNORMAL HIGH (ref 70–99)
Glucose-Capillary: 205 mg/dL — ABNORMAL HIGH (ref 70–99)
Glucose-Capillary: 170 mg/dL — ABNORMAL HIGH (ref 70–99)

## 2013-04-20 LAB — STOOL CULTURE

## 2013-04-20 LAB — CBC
HCT: 31.6 % — ABNORMAL LOW (ref 39.0–52.0)
MCH: 29.5 pg (ref 26.0–34.0)
MCV: 95.2 fL (ref 78.0–100.0)
Platelets: 162 10*3/uL (ref 150–400)
RDW: 17.9 % — ABNORMAL HIGH (ref 11.5–15.5)
WBC: 9.3 10*3/uL (ref 4.0–10.5)

## 2013-04-20 LAB — BASIC METABOLIC PANEL
BUN: 38 mg/dL — ABNORMAL HIGH (ref 6–23)
CO2: 22 mEq/L (ref 19–32)
Calcium: 8.8 mg/dL (ref 8.4–10.5)
Creatinine, Ser: 2.58 mg/dL — ABNORMAL HIGH (ref 0.50–1.35)
GFR calc non Af Amer: 25 mL/min — ABNORMAL LOW (ref >90)
Glucose, Bld: 128 mg/dL — ABNORMAL HIGH (ref 70–99)
Sodium: 139 mEq/L (ref 135–145)

## 2013-04-20 NOTE — Progress Notes (Signed)
I agree with the following treatment note after reviewing documentation.   Johnston, Tyrees Chopin Brynn   OTR/L Pager: [phone] Office: [phone] .   

## 2013-04-20 NOTE — Progress Notes (Addendum)
Subjective:  The patient did well with surgery yesterday. No chest pain or dyspnea. Rhythm at bedside controlled atrial fib.  Objective:  Vital Signs in the last 24 hours: Temp:  [97.9 F (36.6 C)-98.8 F (37.1 C)] 98.6 F (37 C) (06/06 0528) Pulse Rate:  [70-105] 97 (06/06 0528) Resp:  [16-25] 20 (06/06 0528) BP: (142-179)/(57-96) 179/86 mmHg (06/06 0528) SpO2:  [95 %-100 %] 97 % (06/06 0528) Weight:  [174 lb 2.6 oz (79 kg)] 174 lb 2.6 oz (79 kg) (06/06 0500)  Intake/Output from previous day: 06/05 0701 - 06/06 0700 In: 350 [I.V.:350] Out: 605 [Urine:605] Intake/Output from this shift: Total I/O In: 360 [P.O.:360] Out: 100 [Urine:100]  . antiseptic oral rinse  15 mL Mouth Rinse BID  . aspirin EC  325 mg Oral Daily  . atorvastatin  40 mg Oral q1800  . carvedilol  12.5 mg Oral BID WC  . darbepoetin (ARANESP) injection - NON-DIALYSIS  100 mcg Subcutaneous Q Thu-1800  . docusate sodium  100 mg Oral BID  . enoxaparin (LOVENOX) injection  30 mg Subcutaneous Q24H  . famotidine  20 mg Oral Daily  . fentaNYL  50 mcg Intravenous Once  . furosemide  80 mg Oral Daily  . hydrocerin   Topical BID  . insulin aspart  0-5 Units Subcutaneous QHS  . insulin aspart  0-9 Units Subcutaneous TID WC  . insulin detemir  10 Units Subcutaneous Daily  . mycophenolate  500 mg Oral BID  . omega-3 acid ethyl esters  1 g Oral Daily  . predniSONE  5 mg Oral BID  . pyridOXINE  200 mg Oral Daily  . sodium chloride  3 mL Intravenous Q12H  . vitamin B-12  250 mcg Oral Daily  . vitamin C  500 mg Oral Daily   . lactated ringers 75 mL/hr at 04/19/13 2103    Physical Exam: The patient appears to be in no distress.  Head and neck exam reveals that the pupils are equal and reactive.  His eyes are somewhat proptotic but thyroid function is normal. The extraocular movements are full.  There is no scleral icterus.  Mouth and pharynx are benign.  No lymphadenopathy.  No carotid bruits.  The jugular  venous pressure is normal.  Thyroid is not enlarged or tender.  Chest is clear to percussion and auscultation.  No rales or rhonchi.  Expansion of the chest is symmetrical.  Heart reveals no abnormal lift or heave.  First and second heart sounds are normal.  There is no  gallop rub or click.  There is a grade 2/6 apical regurgitant murmur.  The abdomen is soft and nontender.  Bowel sounds are normoactive.  There is no hepatosplenomegaly or mass.  There are no abdominal bruits.  Extremities reveal no significant edema..    Lab Results:  Recent Labs  04/19/13 0400 04/20/13 0545  WBC 7.7 9.3  HGB 9.5* 9.8*  PLT 166 162    Recent Labs  04/19/13 0400 04/20/13 0545  NA 137 139  K 3.6 3.9  CL 99 100  CO2 27 22  GLUCOSE 172* 128*  BUN 41* 38*  CREATININE 2.74* 2.58*   No results found for this basename: TROPONINI, CK, MB,  in the last 72 hours Hepatic Function Panel  Recent Labs  04/19/13 0400  PROT 5.2*  ALBUMIN 2.6*  AST 15  ALT 23  ALKPHOS 88  BILITOT 0.3   No results found for this basename: CHOL,  in the last 72  hours No results found for this basename: PROTIME,  in the last 72 hours  Imaging: Imaging results have been reviewed. Two-dimensional echocardiogram on 04/17/13: Impressions:  - There is a large circumferential pericardial effusion. My impression is that there may be slight decrease in size of the effusion since 04/12/2013. However, this is only an imprression.  Cardiac Studies: Telemetry shows paced ventricular rhythm.  No P waves seen. Assessment/Plan:  1. Large pericardial effusion.  No evidence of tamponade.   2. Acute on chronic systolic congestive heart failure  3. Biventricular ICD placement  4. Chronic kidney disease with previous transplant  5. Diarrhea improving. 6. Nausea and indigestion, improved 7. S/P left 3-4 discectomy doing well.  Plan: Continue present cardiac regimen.  Will plan to repeat limited 2D echo as outpatient in  several weeks. Will sign off now.   LOS: 11 days    [name] 04/20/2013, 9:01 AM

## 2013-04-20 NOTE — Progress Notes (Signed)
I await postop PT and OT to assess most appropriate rehab venue. [phone]

## 2013-04-20 NOTE — Evaluation (Signed)
Physical Therapy Evaluation Patient Details Name: [name] MRN: [id number] DOB: [birth year] Today's Date: 04/20/2013 Time: 1350-1410 PT Time Calculation (min): 20 min  PT Assessment / Plan / Recommendation Clinical Impression    Pt admitted with recurrent HNP and SIRS. Underwent redo lumbar diskectomy and laminectomy on 04/19/13. Pt currently with functional limitations due to the deficits listed below (PT Problem List). Pt will benefit from skilled PT to increase their independence and safety with mobility to allow discharge to CIR prior to return home.      PT Assessment  Patient needs continued PT services    Follow Up Recommendations  CIR    Does the patient have the potential to tolerate intense rehabilitation      Barriers to Discharge        Equipment Recommendations  Rolling walker with 5" wheels    Recommendations for Other Services Rehab consult   Frequency Min 5X/week    Precautions / Restrictions Precautions Precautions: Back;Fall   Pertinent Vitals/Pain See flow sheet.      Mobility  Bed Mobility Rolling Right: 3: Mod assist;With rail Right Sidelying to Sit: 3: Mod assist;With rails;HOB elevated (17 degrees) Details for Bed Mobility Assistance: Verbal/tactile cues for technique and assist to bring trunk up. Transfers Sit to Stand: 3: Mod assist;With upper extremity assist;From elevated surface;From bed Stand to Sit: 3: Mod assist;With upper extremity assist;With armrests;To chair/3-in-1 Details for Transfer Assistance: verbal cues for hand placement Ambulation/Gait Ambulation/Gait Assistance: 4: Min assist Ambulation Distance (Feet): 25 Feet Assistive device: Rolling walker Ambulation/Gait Assistance Details: verbal cues to stand more erect and not get too close to front of walker. Pt with rt  AFO. Gait Pattern: Step-through pattern;Trunk flexed;Decreased step length - right;Decreased step length - left;Shuffle;Right flexed knee in stance;Left flexed  knee in stance Gait velocity: decreased General Gait Details: Pt with heavy reliance on arms on walker for gait.    Exercises     PT Diagnosis: Difficulty walking;Abnormality of gait;Generalized weakness;Acute pain  PT Problem List: Decreased strength;Decreased activity tolerance;Decreased balance;Decreased mobility;Decreased knowledge of use of DME;Decreased knowledge of precautions;Pain PT Treatment Interventions: DME instruction;Gait training;Patient/family education;Functional mobility training;Therapeutic activities;Balance training;Therapeutic exercise   PT Goals Acute Rehab PT Goals PT Goal Formulation: With patient Time For Goal Achievement: 04/27/13 Pt will go Supine/Side to Sit: with supervision PT Goal: Supine/Side to Sit - Progress: Goal set today Pt will go Sit to Supine/Side: with supervision PT Goal: Sit to Supine/Side - Progress: Goal set today Pt will go Sit to Stand: with supervision PT Goal: Sit to Stand - Progress: Goal set today Pt will go Stand to Sit: with supervision PT Goal: Stand to Sit - Progress: Goal set today PT Transfer Goal: Bed to Chair/Chair to Bed - Progress: Discontinued (comment) Pt will Ambulate: 51 - 150 feet;with supervision;with least restrictive assistive device PT Goal: Ambulate - Progress: Goal set today  Visit Information  Last PT Received On: 04/20/13 Assistance Needed: +1    Subjective Data  Subjective: Pt states his leg pain is improved. Patient Stated Goal: Return home   Prior Functioning  Home Living Lives With: Spouse Available Help at Discharge: Family Type of Home: House Home Access: Level entry Home Layout: One level Bathroom Shower/Tub: Health visitor: Handicapped height Bathroom Accessibility: Yes How Accessible: Accessible via wheelchair;Accessible via walker Home Adaptive Equipment: Grab bars around toilet;Grab bars in shower;Shower chair with back;Quad cane;Raised toilet seat with rails;Walker -  rolling Additional Comments: Pt and wife built a fully handicap accessible  house Prior Function Level of Independence: Needs assistance Needs Assistance: Bathing;Dressing Bath: Moderate Dressing: Moderate Able to Take Stairs?: No Driving: Yes Vocation: Retired Comments: Was using an office chair to roll around in to get around house. Communication Communication: No difficulties Dominant Hand: Left    Cognition  Cognition Arousal/Alertness: Awake/alert Behavior During Therapy: WFL for tasks assessed/performed Overall Cognitive Status: Within Functional Limits for tasks assessed    Extremity/Trunk Assessment Right Lower Extremity Assessment RLE ROM/Strength/Tone: Deficits RLE ROM/Strength/Tone Deficits: dorsiflexion 2/5, hip/knee grossly 3+/5 Left Lower Extremity Assessment LLE ROM/Strength/Tone: Deficits LLE ROM/Strength/Tone Deficits: grossly 4-/5   Balance Static Standing Balance Static Standing - Balance Support: Bilateral upper extremity supported Static Standing - Level of Assistance: 4: Min assist  End of Session PT - End of Session Equipment Utilized During Treatment: Gait belt Activity Tolerance: Patient limited by fatigue Patient left: in chair;with call bell/phone within reach;with family/visitor present  GP     [facility] 04/20/2013, 2:37 PM  [facility] PT [phone]

## 2013-04-20 NOTE — Progress Notes (Signed)
Occupational Therapy Evaluation Patient Details Name: [name] MRN: [id number] DOB: 1945/12/24 Today's Date: 04/20/2013 Time: 4098-1191 OT Time Calculation (min): 53 min  OT Assessment / Plan / Recommendation Clinical Impression  Pt admitted with recurrent HNP and SIRS. Underwent redo lumbar diskectomy and laminectomy on 04/19/13. PLOF is unclear, inbetween surgeries he spent all his time in a wheeled office chair. Pt would benefit from skilled OT services while in acute care. Pt would benefit from CIR stay to maintain safety with precautions, DME and AE, caregiver education, general deconditioning and BUE strength. Next session to focus on ADL at sink level and toileting.     OT Assessment  Patient needs continued OT Services    Follow Up Recommendations  CIR       Equipment Recommendations  Other (comment) (To be determined)    Recommendations for Other Services Rehab consult  Frequency  Min 2X/week    Precautions / Restrictions Precautions Precautions: Back;Fall Precaution Comments: Pt knew 3/3 back precautions, but unsure how to apply for bed mobility Required Braces or Orthoses: Other Brace/Splint (AFO) Other Brace/Splint: no back brace Restrictions Weight Bearing Restrictions: No   Pertinent Vitals/Pain Pt reported no pain at beginning of session and 6 at highest point of session. "Its so much better than before"    ADL  Lower Body Dressing: Performed;Moderate assistance;Other (comment) (with AE) Where Assessed - Lower Body Dressing: Unsupported sitting Toilet Transfer: Simulated;Moderate assistance Toilet Transfer Method: Sit to stand Toilet Transfer Equipment: Comfort height toilet Equipment Used: Gait belt;Rolling walker Transfers/Ambulation Related to ADLs: Pt impulsive on transfers with mod vc's for safe hand placement and maintaining precautions. Pt ambulated to door of room and back with heavy UE support on RW. Pt had trouble turning to the left.  ADL Comments:  Pt received education on AE for LB dressing. Pt needs opportunities to practice. Has had wife's assistance but would like to be more independent.     OT Diagnosis: Generalized weakness;Acute pain  OT Problem List: Decreased strength;Decreased activity tolerance;Impaired balance (sitting and/or standing);Decreased safety awareness;Decreased knowledge of use of DME or AE;Decreased knowledge of precautions;Pain OT Treatment Interventions: Self-care/ADL training;Therapeutic exercise;DME and/or AE instruction;Therapeutic activities;Patient/family education;Balance training   OT Goals Acute Rehab OT Goals OT Goal Formulation: With patient Time For Goal Achievement: 05/04/13 Potential to Achieve Goals: Good ADL Goals Pt Will Perform Grooming: with supervision;Standing at sink Pt Will Perform Lower Body Bathing: with supervision;Sit to stand from bed;with adaptive equipment Pt Will Perform Lower Body Dressing: with supervision;Sit to stand from bed;with adaptive equipment ADL Goal: Lower Body Dressing - Progress: Progressing toward goals Pt Will Transfer to Toilet: with supervision;Ambulation;Comfort height toilet;Grab bars;Maintaining back safety precautions ADL Goal: Toilet Transfer - Progress: Progressing toward goals Pt Will Perform Toileting - Clothing Manipulation: with supervision;Standing Pt Will Perform Toileting - Hygiene: with modified independence;Sitting on 3-in-1 or toilet Pt Will Perform Tub/Shower Transfer: Shower transfer;with supervision;Ambulation;with DME;Maintaining back safety precautions Miscellaneous OT Goals Miscellaneous OT Goal #1: Pt will perform bed mobility modified independently in prep for ADL. OT Goal: Miscellaneous Goal #1 - Progress: Progressing toward goals  Visit Information  Last OT Received On: 04/20/13 Assistance Needed: +1 PT/OT Co-Evaluation/Treatment: Yes    Subjective Data  Subjective: "I spent the last 2 weeks rolling around in a desk  chair." Patient Stated Goal: "To be able to grill with my new webber on my new back patio"   Prior Functioning     Home Living Lives With: Spouse Available Help at Discharge: Family  Type of Home: House Home Access: Level entry Home Layout: One level Bathroom Shower/Tub: Health visitor: Handicapped height Bathroom Accessibility: Yes How Accessible: Accessible via wheelchair;Accessible via walker Home Adaptive Equipment: Grab bars around toilet;Grab bars in shower;Shower chair with back;Quad cane;Raised toilet seat with rails;Walker - rolling Additional Comments: Pt and wife built a fully handicap accessible house Prior Function Level of Independence: Needs assistance Needs Assistance: Bathing;Dressing Bath: Moderate Dressing: Moderate Able to Take Stairs?: No Driving: Yes Vocation: Retired Comments: Was using an office chair to roll around in to get around house. Communication Communication: No difficulties Dominant Hand: Left         Vision/Perception Vision - History Baseline Vision: No visual deficits Visual History: Cataracts;Other (comment) (cataract surgey on right eye)   Cognition  Cognition Arousal/Alertness: Awake/alert Behavior During Therapy: WFL for tasks assessed/performed Overall Cognitive Status: Within Functional Limits for tasks assessed    Extremity/Trunk Assessment Right Upper Extremity Assessment RUE ROM/Strength/Tone: WFL for tasks assessed (Pt would like strength building regiment for BUE) Left Upper Extremity Assessment LUE ROM/Strength/Tone: WFL for tasks assessed (Pt would like strength routine to improve BUE tone)      Mobility Bed Mobility Bed Mobility: Rolling Right;Right Sidelying to Sit;Sitting - Scoot to Edge of Bed Rolling Right: 3: Mod assist;With rail Right Sidelying to Sit: 3: Mod assist;With rails;HOB elevated (17 degrees) Details for Bed Mobility Assistance: Verbal/tactile cues for technique and assist to  bring trunk up. Transfers Transfers: Sit to Stand;Stand to Sit Sit to Stand: 3: Mod assist;With upper extremity assist;From elevated surface;From bed Stand to Sit: 3: Mod assist;With upper extremity assist;With armrests;To chair/3-in-1 Details for Transfer Assistance: verbal cues for hand placement           End of Session OT - End of Session Equipment Utilized During Treatment: Gait belt;Other (comment) (AFO,. RW) Activity Tolerance: Patient tolerated treatment well Patient left: in chair;with call bell/phone within reach;with family/visitor present Nurse Communication: Mobility status  GO     Sherryl Manges 04/20/2013, 2:50 PM

## 2013-04-20 NOTE — Progress Notes (Signed)
Patient ID: [name], male   DOB: [birth year], 67 y.o.   MRN: [id number] Subjective:  The patient is alert and pleasant. He looks well. He tells me his left leg pain is much better since surgery.  Objective: Vital signs in last 24 hours: Temp:  [97.9 F (36.6 C)-98.8 F (37.1 C)] 98.6 F (37 C) (06/06 0528) Pulse Rate:  [70-105] 97 (06/06 0528) Resp:  [16-25] 20 (06/06 0528) BP: (142-179)/(57-96) 179/86 mmHg (06/06 0528) SpO2:  [95 %-100 %] 97 % (06/06 0528) Weight:  [79 kg (174 lb 2.6 oz)] 79 kg (174 lb 2.6 oz) (06/06 0500)  Intake/Output from previous day: 06/05 0701 - 06/06 0700 In: 350 [I.V.:350] Out: 605 [Urine:605] Intake/Output this shift: Total I/O In: 360 [P.O.:360] Out: 100 [Urine:100]  Physical exam the patient is alert and oriented. His strength is grossly normal his lower extremities.  Lab Results:  Recent Labs  04/19/13 0400 04/20/13 0545  WBC 7.7 9.3  HGB 9.5* 9.8*  HCT 30.4* 31.6*  PLT 166 162   BMET  Recent Labs  04/19/13 0400 04/20/13 0545  NA 137 139  K 3.6 3.9  CL 99 100  CO2 27 22  GLUCOSE 172* 128*  BUN 41* 38*  CREATININE 2.74* 2.58*  CALCIUM 8.7 8.8    Studies/Results: Dg Lumbar Spine 1 View  04/19/2013   *RADIOLOGY REPORT*  Clinical Data: Lumbar laminectomy.  LUMBAR SPINE - 1 VIEW  Comparison: None.  Findings: Single lateral intraoperative localizer is submitted for interpretation.  There is a probe dorsal to the pedicles of the L4 vertebra.  Vacuum disc at L3-L4 is used as a reference.  Numbering convention used on prior exam 04/17/2013 is again used.  IMPRESSION: Intraoperative localization above.   Original Report Authenticated By: Andreas Newport, M.D.    Assessment/Plan: Postop day 1: The patient is doing much better status post removal of recurrent ruptured disc. We will mobilize him with PT and OT. He may go home over the weekend. Alternatively he may need rehabilitation. We'll see how he does over the weekend.  LOS: 11 days      Jacion Dismore D 04/20/2013, 9:25 AM

## 2013-04-20 NOTE — Progress Notes (Signed)
Patient ID: [name], male   DOB: October 25, 1946, 67 y.o.   MRN: [id number] S:feels better O:BP 162/73  Pulse 92  Temp(Src) 98.4 F (36.9 C) (Oral)  Resp 18  Ht 6\' 1"  (1.854 m)  Wt 79 kg (174 lb 2.6 oz)  BMI 22.98 kg/m2  SpO2 99%  Intake/Output Summary (Last 24 hours) at 04/20/13 1256 Last data filed at 04/20/13 0900  Gross per 24 hour  Intake    830 ml  Output    605 ml  Net    225 ml   Intake/Output: I/O last 3 completed shifts: In: 350 [I.V.:350] Out: 1155 [Urine:1155]  Intake/Output this shift:  Total I/O In: 480 [P.O.:480] Out: 300 [Urine:300] Weight change: 3.5 kg (7 lb 11.5 oz) Gen:WD WN WM in NAD CVS:IRR IRR Resp:cta YQM:VHQION Ext:tr edema   Recent Labs Lab 04/14/13 0435 04/16/13 0535 04/17/13 0405 04/18/13 0505 04/19/13 0400 04/20/13 0545  NA 138 139 138 140 137 139  K 3.7 4.1 3.9 3.7 3.6 3.9  CL 100 97 97 100 99 100  CO2 23 26 28 27 27 22   GLUCOSE 142* 100* 133* 142* 172* 128*  BUN 61* 51* 51* 45* 41* 38*  CREATININE 3.00* 3.07* 3.18* 3.05* 2.74* 2.58*  ALBUMIN 2.1* 2.2*  --   --  2.6*  --   CALCIUM 8.7 8.6 8.9 8.9 8.7 8.8  PHOS 5.2* 4.7*  --   --   --   --   AST  --   --   --   --  15  --   ALT  --   --   --   --  23  --    Liver Function Tests:  Recent Labs Lab 04/14/13 0435 04/16/13 0535 04/19/13 0400  AST  --   --  15  ALT  --   --  23  ALKPHOS  --   --  88  BILITOT  --   --  0.3  PROT  --   --  5.2*  ALBUMIN 2.1* 2.2* 2.6*   No results found for this basename: LIPASE, AMYLASE,  in the last 168 hours No results found for this basename: AMMONIA,  in the last 168 hours CBC:  Recent Labs Lab 04/14/13 0435 04/15/13 1015 04/18/13 0505 04/19/13 0400 04/20/13 0545  WBC 5.5 7.2 9.7 7.7 9.3  NEUTROABS  --  5.9  --   --   --   HGB 8.4* 8.9* 9.3* 9.5* 9.8*  HCT 26.5* 27.9* 30.2* 30.4* 31.6*  MCV 92.0 92.4 93.5 94.4 95.2  PLT 114* 122* 172 166 162   Cardiac Enzymes: No results found for this basename: CKTOTAL, CKMB, CKMBINDEX,  TROPONINI,  in the last 168 hours CBG:  Recent Labs Lab 04/19/13 1142 04/19/13 1410 04/19/13 2203 04/20/13 0648 04/20/13 1208  GLUCAP 110* 120* 197* 113* 153*    Iron Studies: No results found for this basename: IRON, TIBC, TRANSFERRIN, FERRITIN,  in the last 72 hours Studies/Results: Dg Lumbar Spine 1 View  04/19/2013   *RADIOLOGY REPORT*  Clinical Data: Lumbar laminectomy.  LUMBAR SPINE - 1 VIEW  Comparison: None.  Findings: Single lateral intraoperative localizer is submitted for interpretation.  There is a probe dorsal to the pedicles of the L4 vertebra.  Vacuum disc at L3-L4 is used as a reference.  Numbering convention used on prior exam 04/17/2013 is again used.  IMPRESSION: Intraoperative localization above.   Original Report Authenticated By: Andreas Newport, M.D.   . antiseptic oral  rinse  15 mL Mouth Rinse BID  . aspirin EC  325 mg Oral Daily  . atorvastatin  40 mg Oral q1800  . carvedilol  12.5 mg Oral BID WC  . darbepoetin (ARANESP) injection - NON-DIALYSIS  100 mcg Subcutaneous Q Thu-1800  . docusate sodium  100 mg Oral BID  . enoxaparin (LOVENOX) injection  30 mg Subcutaneous Q24H  . famotidine  20 mg Oral Daily  . fentaNYL  50 mcg Intravenous Once  . furosemide  80 mg Oral Daily  . hydrocerin   Topical BID  . insulin aspart  0-5 Units Subcutaneous QHS  . insulin aspart  0-9 Units Subcutaneous TID WC  . insulin detemir  10 Units Subcutaneous Daily  . mycophenolate  500 mg Oral BID  . omega-3 acid ethyl esters  1 g Oral Daily  . predniSONE  5 mg Oral BID  . pyridOXINE  200 mg Oral Daily  . sodium chloride  3 mL Intravenous Q12H  . vitamin B-12  250 mcg Oral Daily  . vitamin C  500 mg Oral Daily    BMET    Component Value Date/Time   NA 139 04/20/2013 0545   K 3.9 04/20/2013 0545   CL 100 04/20/2013 0545   CO2 22 04/20/2013 0545   GLUCOSE 128* 04/20/2013 0545   BUN 38* 04/20/2013 0545   CREATININE 2.58* 04/20/2013 0545   CALCIUM 8.8 04/20/2013 0545   GFRNONAA 25*  04/20/2013 0545   GFRAA 29* 04/20/2013 0545   CBC    Component Value Date/Time   WBC 9.3 04/20/2013 0545   RBC 3.32* 04/20/2013 0545   HGB 9.8* 04/20/2013 0545   HCT 31.6* 04/20/2013 0545   PLT 162 04/20/2013 0545   MCV 95.2 04/20/2013 0545   MCH 29.5 04/20/2013 0545   MCHC 31.0 04/20/2013 0545   RDW 17.9* 04/20/2013 0545   LYMPHSABS 0.9 04/15/2013 1015   MONOABS 0.4 04/15/2013 1015   EOSABS 0.1 04/15/2013 1015   BASOSABS 0.0 04/15/2013 1015     Assessment/Plan:  1. 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 AKI likely related to aggressive diuresis and volume depletion due to diarrhea.  1. Steady improvement in Scr since being switched over to po lasix daily rather than BID. 2. S/p CT myelogram without increase in Scr. Cont to follow.  2. CHF- markedly improved volume status. EF 30-35%. On lower dose of po lasix (will hold pm dose today due to rise in Scr). Continues to diurese 3. Pericardial effusion without tamponade. Agree with repeat ECHO 4. Left hip pain- CT myelogram revealed soft tissue mass in L4-5 territory (possible extruded material from disk impinging upon the thecal sac.  1. S/p disckectomy with Dr. Lovell Sheehan of Neurosurgery and doing well. 5. DM- stable 6. Diarrhea/Nausea- may benefit from immodium or lomotil. Likely related to ongoing abx. Consider probiotics.  7. H/o Vfib arrest, s/p AICD 8. Immunosuppressive therapy- cont with MMF and pred 9. Cellulitis- off Abx with improvement of diarrhea (completed 1 week of IV zosyn) 10. Anemia- normocytic. May benefit from ESA. Will cont to follow 11. Dispo- awaiting PT/OT eval as he may need CIR  12.   Carloyn Lahue A

## 2013-04-20 NOTE — Progress Notes (Signed)
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 Lovell Sheehan to follow - PT, OT today, discussed in detail with the patient, will continue to monitor and reassess in next few days (Monday) if he would need rehabilitation.  SIRS (systemic inflammatory response syndrome) - resolved    Cellulitis of right lower extremity- improving    Renal failure (ARF), acute on chronic kidney disease stage 3 , history of renal transplant - Renal service following, on oral Lasix. Continue prednisone and CellCept  Demand ischemia/CAD (coronary artery disease) -no chest pain or acute dyspnea   -cont Coreg and ASA   Systolic Heart Failure/EF 30-35%/Grade 2 Diastolic Heart Failure  -baseline EF 25% in March 2013 after PEA arrest requiring PPM/ICD  -cont coreg, cards following   Large Pericardial Effusion - no evidence of tamponade -started on Colchicine per Cards, but d/t diarrhea, it was stopped.  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 c diff pcr negative, stool studies neg  DVT Prophylaxis:  Code  Status:  Disposition: Inpatient rehabilitation versus home when medically stable, follow PT evaluation    Subjective: Patient feels a whole lot better today, looking forward to the physical therapy, no specific complaints  Objective: Weight change: 3.5 kg (7 lb 11.5 oz)  Intake/Output Summary (Last 24 hours) at 04/20/13 1350 Last data filed at 04/20/13 0900  Gross per 24 hour  Intake    830 ml  Output    605 ml  Net    225 ml   Blood pressure 162/73, pulse 92, temperature 98.4 F (36.9 C), temperature source Oral, resp. rate 18, height 6\' 1"  (1.854 m), weight 79 kg (174 lb 2.6 oz), SpO2 99.00%.  Physical Exam: General: Alert and awake, oriented x3, not in any acute distress. CVS: S1-S2 clear, 2/6 murmur Chest: clear to auscultation bilaterally, no wheezing, rales or rhonchi Abdomen: soft nontender, nondistended, normal bowel sounds  Extremities: no cyanosis, clubbing, no significant edema noted bilaterally  Lab Results: Basic Metabolic Panel:  Recent Labs Lab 04/16/13 0535  04/19/13 0400 04/20/13 0545  NA 139  < > 137 139  K 4.1  < > 3.6 3.9  CL 97  < > 99 100  CO2 26  < > 27 22  GLUCOSE 100*  < > 172* 128*  BUN 51*  < > 41* 38*  CREATININE 3.07*  < > 2.74* 2.58*  CALCIUM 8.6  < > 8.7 8.8  MG 1.7  --   --   --   PHOS 4.7*  --   --   --   < > = values in this  interval not displayed. Liver Function Tests:  Recent Labs Lab 04/16/13 0535 04/19/13 0400  AST  --  15  ALT  --  23  ALKPHOS  --  88  BILITOT  --  0.3  PROT  --  5.2*  ALBUMIN 2.2* 2.6*   No results found for this basename: LIPASE, AMYLASE,  in the last 168 hours No results found for this basename: AMMONIA,  in the last 168 hours CBC:  Recent Labs Lab 04/15/13 1015  04/19/13 0400 04/20/13 0545  WBC 7.2  < > 7.7 9.3  NEUTROABS 5.9  --   --   --   HGB 8.9*  < > 9.5* 9.8*  HCT 27.9*  < > 30.4* 31.6*  MCV 92.4  < > 94.4 95.2  PLT 122*  < > 166 162  < > = values in this interval not  displayed. Cardiac Enzymes: No results found for this basename: CKTOTAL, CKMB, CKMBINDEX, TROPONINI,  in the last 168 hours BNP: No components found with this basename: POCBNP,  CBG:  Recent Labs Lab 04/19/13 1142 04/19/13 1410 04/19/13 2203 04/20/13 0648 04/20/13 1208  GLUCAP 110* 120* 197* 113* 153*     Micro Results: Recent Results (from the past 240 hour(s))  CLOSTRIDIUM DIFFICILE BY PCR     Status: None   Collection Time    04/16/13  2:38 PM      Result Value Range Status   C difficile by pcr NEGATIVE  NEGATIVE Final  STOOL CULTURE     Status: None   Collection Time    04/16/13  2:38 PM      Result Value Range Status   Specimen Description STOOL   Final   Special Requests NONE   Final   Culture     Final   Value: NO SALMONELLA, SHIGELLA, CAMPYLOBACTER, YERSINIA, OR E.COLI 0157:H7 ISOLATED     Note: REDUCED NORMAL FLORA PRESENT   Report Status 04/20/2013 FINAL   Final    Studies/Results: Dg Chest 2 View  03/23/2013   *RADIOLOGY REPORT*  Clinical Data: Preop evaluation for lumbar spine surgery diabetes, hypertension, pacemaker  CHEST - 2 VIEW  Comparison: 07/27/2012  Findings: Cardiomegaly noted without CHF or pneumonia.  No effusion or pneumothorax.  Left subclavian pacer evident.  Previous right PICC line removed. Atherosclerotic calcifications of the aorta  IMPRESSION: Cardiomegaly without CHF or pneumonia   Original Report Authenticated By: Judie Petit. Miles Costain, M.D.   Dg Lumbar Spine 2-3 Views  03/26/2013   *RADIOLOGY REPORT*  Clinical Data: Lumbar laminectomy  LUMBAR SPINE - 2-3 VIEW  Comparison: 05/04 12/04/2012  Findings: The first intraoperative lumbar radiograph shows a needle projecting between the L2 and L3 spinous processes.  Second film demonstrates surgical instruments projecting posterior to the   L3- 4 interspace.  IMPRESSION:  Intraoperative localization   Original Report Authenticated By: D. Andria Rhein, MD   Dg Hip Complete Left  04/16/2013   *RADIOLOGY REPORT*   Clinical Data: Fall.  Left hip and knee pain.  LEFT HIP - COMPLETE 2+ VIEW  Comparison: None.  Findings: Pelvic rings appear intact.  Right hip hemiarthroplasty. Atherosclerosis.  No displaced pelvic fracture.  Left hip chondrocalcinosis.  No left hip fracture.  No significant joint space narrowing.  IMPRESSION: No acute osseous abnormality.  Chondrocalcinosis of the left hip, which may be associated with CPPD arthropathy or senile.   Original Report Authenticated By: Andreas Newport, M.D.   Ct Lumbar Spine Wo Contrast  04/10/2013   *RADIOLOGY REPORT*  Clinical Data: Confusion with shortness of breath and fever following L3 decompression 03/26/2013.  Question osteomyelitis.  CT LUMBAR SPINE WITHOUT CONTRAST  Technique:  Multidetector CT imaging of the lumbar spine was performed without intravenous contrast administration. Multiplanar CT image reconstructions were also generated.  Comparison: Intraoperative radiographs 03/26/2013, lumbar myelogram CT 02/28/2013 and CT urogram 05/19/2008.  Findings: There are interval postsurgical changes at L3-L4 status post laminectomy and discectomy.  The spinal canal appears adequately decompressed posteriorly.  There is persistent annular disc bulging eccentric to the left with associated vacuum phenomenon.  There is persistent left foraminal stenosis and possible exiting L3 nerve root encroachment. Lesser foraminal narrowing is present on the right.  In addition, there is new inferior extension of high density and air within the anterior epidural space asymmetric to the left. This exerts mild mass effect on the thecal sac which is displaced posteriorly.  No anterior paraspinal inflammatory changes or fluid collections are identified.  There is no endplate destruction to suggest osteomyelitis.  Postsurgical changes are noted status post right renal transplant. 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  The native right kidney demonstrates  moderate hydronephrosis, new from 2009 and suboptimally visualized on more recent studies. A calcified gallstone is again noted.  There is stable mild disc bulging at L1-L2 and L2-L3.  No spinal stenosis or nerve root encroachment results.  L3-L4: As above.  L4-L5:  Similar annular disc bulging with a small left paracentral disc protrusion and bilateral facet hypertrophy.  There is mild resulting left lateral recess stenosis.  No foraminal compromise or exiting L4 nerve root encroachment is identified.  L5-S1:  Stable annular disc bulging, central disc protrusion and associated vacuum phenomenon.  No sacral nerve root displacement or significant foraminal compromise is identified.  There is stable mild bilateral facet hypertrophy.  IMPRESSION:  1.  Interval postsurgical changes at L3-L4. An epidural air fluid collection extends inferiorly from the disc space behind the L4 vertebral body and exerts mass effect on the thecal sac.  As this appears contiguous with the L3-L4 disc which demonstrates vacuum phenomenon, this may reflect an extruded disc fragment.  I cannot exclude an epidural abscess, although no generalized inflammatory changes are identified. There is persistent foraminal narrowing at L3-L4, worse on the left. 2.  No CT evidence of osteomyelitis. 3.  Stable disc protrusions at L4-L5 and L5-S1. 4.  Apparent hydronephrosis of the native right renal collecting system of uncertain significance and etiology.   Original Report Authenticated By: Carey Bullocks, M.D.   Ct Lumbar Spine W Contrast  04/17/2013   *RADIOLOGY REPORT*  Clinical Data: Left leg pain post surgery.  LUMBAR MYELOGRAM AND POST MYELOGRAM CT  MYELOGRAM  LUMBAR  Technique: Order reviewed. As the patient has acute on top of chronic renal insufficiency, the need for the case was discussed with Dr. Gerlene Fee.  Dr. Randell Loop who is following the patient (nephrology service) agreed to low volume intrathecal contrast. Any available prior imaging  reviewed.  The procedure and associated risks were reviewed with the patient (including but not limited to headache, bleeding, infection, contrast reaction, nondiagnostic study, nerve damage, all of which may require treatment).  All questions were answered.  Written and oral witnessed consent was obtained.  Time out performed.  Under sterile technique, a  lumbar puncture was performed with a L2- 3 pass of a 22g needle.  Clear CSF was noted. 8cc of Omnipaque 180 was instilled.   Lumbar myelogram fluoroscopic images  were obtained.  Post procedure instructions were reviewed with the patient.  Fluoroscopy time:  48 seconds  Comparison: 04/11/2011 CT and preoperative post myelogram 03/16/2011 CT.  Findings: Dextroscoliosis lumbar spine.  L3-4 disc space narrowing greater on the left.  Impression upon the left lateral aspect of the thecal sac at the L3-4 level compressing the left L4 nerve root.  IMPRESSION: Dextroscoliosis lumbar spine.  L3-4 disc space narrowing greater on the left.  Impression upon the left lateral aspect of the thecal sac at the L3- 4 level compressing the left L4 nerve root.  Please see below for further detail.  CT MYELOGRAPHY LUMBAR SPINE  Technique: CT imaging of the lumbar spine was performed after intrathecal contrast administration.  Multiplanar CT image reconstructions were also generated.  Findings:  Last fully open disc space is labeled L5-S1.  Present examination incorporates from T12-L1 through the mid sacrum.  Atrophic kidneys more notable on the left.  Arising from the lateral aspect of the left kidney is a 3.2 cm mass which has fatty components.  On the 2009 CT, this measured 5.4 cm (appearing more fatty on the remote exam) and possibly represents treated mass or involuting angiomyolipoma. Clinical correlation recommended.  This is incompletely assessed on present exam.  Hydronephrotic right kidney.  Prominent atherosclerotic type changes of the aorta.  Small bulge L3 level measures  2.4 cm whereas just above this level aorta measures 2.1 cm.  Narrowing of the renal arteries and less so involving the iliac arteries, superior mesenteric and celiac artery.  Conus L1 level.  Dextroscoliosis lumbar spine with disc space narrowing most notable on the left at the L3-4 level.  T12-L1:  Negative.  L1-2:  Negative.  L2-3:  Minimal facet joint degenerative changes.  L3-4:  Prominent disc degeneration with disc space narrowing more notable on the left.  Recent laminectomy/partial facetectomy. Facet joint degenerative changes.  Broad-based calcified disc protrusion extending into the neural foramen with prominent bilateral foraminal narrowing and mass effect upon the exiting L3 nerve roots greater on the left. Postsurgical changes surrounding narrowed thecal sac further contributing to moderate to marked thecal sac narrowing.  Additionally, extending caudally from the left posterior lateral aspect of the disc space is a large soft tissue and air containing collection which may represent extruded disc material reaching the L4-5 disc space and causing mass effect upon the thecal sac and left L4 nerve roots.  Epidural abscess not entirely excluded in the proper clinical setting although a secondary consideration.  This has increased in size since the prior postoperative examination.  L4-5:  Facet joint degenerative changes.  Prior left hemilaminectomy.  Bulge/broad-based protrusion.  Facet joint degenerative changes.  Ligamentum flavum hypertrophy.  Mild to moderate left-sided and mild right-sided lateral recess stenosis. Mild spinal stenosis.  Mild bilateral foraminal narrowing.  L5-S1:  Facet joint degenerative changes.  Disc degeneration with broad-based slightly caudally extending disc protrusion which touches but does not cause significant mass effect upon the S1 nerve roots or thecal sac.  Lateral extension greater to the right with mild encroaching upon the exiting right L5 nerve root.  IMPRESSION: L3-4  recent laminectomy/partial facetectomy. Broad-based calcified disc protrusion extending into the neural foramen with prominent bilateral foraminal narrowing and mass effect upon the exiting L3 nerve roots greater on the left. Postsurgical changes surrounding narrowed thecal sac further contributing to moderate to marked thecal sac narrowing.  Additionally, extending caudally from the left posterior lateral aspect of the disc space is a large soft tissue  and air containing collection which may represent extruded disc material reaching the L4-5 disc space and causing mass effect upon the thecal sac and left L4 nerve roots.  Epidural abscess not entirely excluded in the proper clinical setting although a secondary consideration.  This has increased in size since the prior postoperative examination.  L4-5 multifactorial mild to moderate left-sided and mild right- sided lateral recess stenosis.  Mild spinal stenosis.  Mild bilateral foraminal narrowing.  L5-S broad-based slightly caudally extending disc protrusion which touches but does not cause significant mass effect upon the S1 nerve roots or thecal sac.  Lateral extension greater to the right with mild encroaching upon the exiting right L5 nerve root.  Atrophic kidneys more notable on the left.  Arising from the lateral aspect of the left kidney is a 3.2 cm mass which has fatty components.  On the 2009 CT, this measured 5.4 cm (appearing more fatty on the remote exam) and possibly represents treated mass or involuting angiomyolipoma. Clinical correlation recommended.  This is incompletely assessed on present exam.  Hydronephrotic right kidney.  Prominent atherosclerotic type changes of the aorta.  Small bulge L3 level measures 2.4 cm whereas just above this level aorta measures 2.1 cm.  Narrowing of the renal arteries and less so involving the iliac arteries, superior mesenteric and celiac artery.  This has been made a PRA call report utilizing dashboard call  feature.   Original Report Authenticated By: Lacy Duverney, M.D.   Ct Knee Right Wo Contrast  04/10/2013   *RADIOLOGY REPORT*  Clinical Data: Knee swelling and fever.  Question osteomyelitis.  CT OF THE RIGHT KNEE WITHOUT CONTRAST  Technique:  Multidetector CT imaging was performed according to the standard protocol. Multiplanar CT image reconstructions were also generated.  Comparison: None.  Findings: No bony destructive change or focal lesion is identified. Mild chondrocalcinosis is noted.  The patient has only a small joint effusion.  There is diffuse subcutaneous edema about the knee without focal subcutaneous fluid collection.  Mild degenerative change is seen in the medial compartment.  Atherosclerotic vascular disease is noted.  IMPRESSION:  1.  No CT evidence of osteomyelitis.  Please note that CT scan is not sensitive for the detection of osteomyelitis. 2.  Small joint effusion.  No focal fluid collection is present. 3.  Subcutaneous edema diffusely about the knee is likely due to dependent change or possibly cellulitis. 4.  Atherosclerosis.   Original Report Authenticated By: Holley Dexter, M.D.   Dg Myelogram Lumbar  04/17/2013   *RADIOLOGY REPORT*  Clinical Data: Left leg pain post surgery.  LUMBAR MYELOGRAM AND POST MYELOGRAM CT  MYELOGRAM  LUMBAR  Technique: Order reviewed. As the patient has acute on top of chronic renal insufficiency, the need for the case was discussed with Dr. Gerlene Fee.  Dr. Randell Loop who is following the patient (nephrology service) agreed to low volume intrathecal contrast. Any available prior imaging reviewed.  The procedure and associated risks were reviewed with the patient (including but not limited to headache, bleeding, infection, contrast reaction, nondiagnostic study, nerve damage, all of which may require treatment).  All questions were answered.  Written and oral witnessed consent was obtained.  Time out performed.  Under sterile technique, a  lumbar puncture  was performed with a L2- 3 pass of a 22g needle.  Clear CSF was noted. 8cc of Omnipaque 180 was instilled.   Lumbar myelogram fluoroscopic images were obtained.  Post procedure instructions were reviewed with the patient.  Fluoroscopy time:  48 seconds  Comparison: 04/11/2011 CT and preoperative post myelogram 03/16/2011 CT.  Findings: Dextroscoliosis lumbar spine.  L3-4 disc space narrowing greater on the left.  Impression upon the left lateral aspect of the thecal sac at the L3-4 level compressing the left L4 nerve root.  IMPRESSION: Dextroscoliosis lumbar spine.  L3-4 disc space narrowing greater on the left.  Impression upon the left lateral aspect of the thecal sac at the L3- 4 level compressing the left L4 nerve root.  Please see below for further detail.  CT MYELOGRAPHY LUMBAR SPINE  Technique: CT imaging of the lumbar spine was performed after intrathecal contrast administration.  Multiplanar CT image reconstructions were also generated.  Findings:  Last fully open disc space is labeled L5-S1.  Present examination incorporates from T12-L1 through the mid sacrum.  Atrophic kidneys more notable on the left.  Arising from the lateral aspect of the left kidney is a 3.2 cm mass which has fatty components.  On the 2009 CT, this measured 5.4 cm (appearing more fatty on the remote exam) and possibly represents treated mass or involuting angiomyolipoma. Clinical correlation recommended.  This is incompletely assessed on present exam.  Hydronephrotic right kidney.  Prominent atherosclerotic type changes of the aorta.  Small bulge L3 level measures 2.4 cm whereas just above this level aorta measures 2.1 cm.  Narrowing of the renal arteries and less so involving the iliac arteries, superior mesenteric and celiac artery.  Conus L1 level.  Dextroscoliosis lumbar spine with disc space narrowing most notable on the left at the L3-4 level.  T12-L1:  Negative.  L1-2:  Negative.  L2-3:  Minimal facet joint degenerative  changes.  L3-4:  Prominent disc degeneration with disc space narrowing more notable on the left.  Recent laminectomy/partial facetectomy. Facet joint degenerative changes.  Broad-based calcified disc protrusion extending into the neural foramen with prominent bilateral foraminal narrowing and mass effect upon the exiting L3 nerve roots greater on the left. Postsurgical changes surrounding narrowed thecal sac further contributing to moderate to marked thecal sac narrowing.  Additionally, extending caudally from the left posterior lateral aspect of the disc space is a large soft tissue and air containing collection which may represent extruded disc material reaching the L4-5 disc space and causing mass effect upon the thecal sac and left L4 nerve roots.  Epidural abscess not entirely excluded in the proper clinical setting although a secondary consideration.  This has increased in size since the prior postoperative examination.  L4-5:  Facet joint degenerative changes.  Prior left hemilaminectomy.  Bulge/broad-based protrusion.  Facet joint degenerative changes.  Ligamentum flavum hypertrophy.  Mild to moderate left-sided and mild right-sided lateral recess stenosis. Mild spinal stenosis.  Mild bilateral foraminal narrowing.  L5-S1:  Facet joint degenerative changes.  Disc degeneration with broad-based slightly caudally extending disc protrusion which touches but does not cause significant mass effect upon the S1 nerve roots or thecal sac.  Lateral extension greater to the right with mild encroaching upon the exiting right L5 nerve root.  IMPRESSION: L3-4 recent laminectomy/partial facetectomy. Broad-based calcified disc protrusion extending into the neural foramen with prominent bilateral foraminal narrowing and mass effect upon the exiting L3 nerve roots greater on the left. Postsurgical changes surrounding narrowed thecal sac further contributing to moderate to marked thecal sac narrowing.  Additionally, extending  caudally from the left posterior lateral aspect of the disc space is a large soft tissue and air containing collection which may represent extruded disc material reaching the L4-5 disc space  and causing mass effect upon the thecal sac and left L4 nerve roots.  Epidural abscess not entirely excluded in the proper clinical setting although a secondary consideration.  This has increased in size since the prior postoperative examination.  L4-5 multifactorial mild to moderate left-sided and mild right- sided lateral recess stenosis.  Mild spinal stenosis.  Mild bilateral foraminal narrowing.  L5-S broad-based slightly caudally extending disc protrusion which touches but does not cause significant mass effect upon the S1 nerve roots or thecal sac.  Lateral extension greater to the right with mild encroaching upon the exiting right L5 nerve root.  Atrophic kidneys more notable on the left.  Arising from the lateral aspect of the left kidney is a 3.2 cm mass which has fatty components.  On the 2009 CT, this measured 5.4 cm (appearing more fatty on the remote exam) and possibly represents treated mass or involuting angiomyolipoma. Clinical correlation recommended.  This is incompletely assessed on present exam.  Hydronephrotic right kidney.  Prominent atherosclerotic type changes of the aorta.  Small bulge L3 level measures 2.4 cm whereas just above this level aorta measures 2.1 cm.  Narrowing of the renal arteries and less so involving the iliac arteries, superior mesenteric and celiac artery.  This has been made a PRA call report utilizing dashboard call feature.   Original Report Authenticated By: Lacy Duverney, M.D.   US Renal  04/12/2013   *RADIOLOGY REPORT*  Clinical Data: Kidney transplant with elevated creatinine. Evaluate for hydronephrosis. History of fluid overload.  RENAL/URINARY TRACT ULTRASOUND COMPLETE  Comparison:  Lumbar spine CT 04/10/2013 and abdominal CT 05/19/2008  Findings:  Right Kidney:  The native  right kidney is small and echogenic.  The right kidney measures 7.7 cm. Difficult to exclude hydronephrosis in the native right kidney.  Cyst in the right kidney measures up to 2.5 cm.  Left Kidney:  Atrophic and echogenic.  Left kidney roughly measures 7.5 cm in length.  Lower pole is difficult to evaluate.  Transplant kidney: The transplant kidney measures 12.4 cm in length.  There are hypoechoic cysts associated with the transplant kidney and a small amount of adjacent ascites.  Largest cyst roughly measures 2.7 cm.  Bladder:  Fluid filled and unremarkable.  Other findings:  There are bilateral pleural effusions. Evidence for an echogenic stone with posterior acoustic shadowing at the base of the gallbladder.  IMPRESSION: No significant hydronephrosis involving the transplant kidney. There appears to be cortical cysts involving the transplant kidney as described.  Bilateral native kidneys are atrophic and echogenic as expected. Evidence for bilateral renal cortical cysts. Difficult to exclude hydronephrosis of the native right kidney.  Bilateral pleural effusions and ascites.  Cholelithiasis.   Original Report Authenticated By: Richarda Overlie, M.D.   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  Perfusion images were obtained in multiple projections after intravenous injection of Tc-107m MAA.  Radiopharmaceuticals:  Tc-49m DTPA aerosol and 6 mCi Tc-35m MAA.  Comparison: None Correlation:  Chest radiograph 04/09/2013  Findings:  Ventilation:  Absent ventilation in left lower lobe.  Enlargement of cardiac silhouette.  Mildly diminished ventilation in lung apices.  Perfusion:  Cardiomegaly.  Diminished  perfusion throughout left lower lobe suspect related to the  enlargement of cardiac silhouette, less severe than accompanying ventilatory loss. Minimal diminished perfusion in the left upper lobe matching ventilatory findings. No additional segmental or subsegmental perfusion defects.  IMPRESSION: Very low probability for pulmonary embolism.   Original Report Authenticated By: [name], M.D.   Dg Chest Port 1 View  04/11/2013   *RADIOLOGY REPORT*  Clinical Data: Shortness of breath, evaluate for volume overload  PORTABLE CHEST - 1 VIEW  Comparison: 04/09/2013; 07/27/2012  Findings: Grossly unchanged marked enlargement of the cardiac silhouette and mediastinal contours.  Stable positioning of support apparatus.  Chronic mild pulmonary venous congestion without frank evidence of edema.  Grossly unchanged bilateral medial basilar heterogeneous opacities.  No new focal airspace opacities.  No definite pleural effusion or pneumothorax.  Unchanged bones.  IMPRESSION:  Chronic findings of cardiomegaly and pulmonary venous congestion without definite evidence of acute superimposed worsening pulmonary edema.   Original Report Authenticated By: Tacey Ruiz, MD   Dg Chest Port 1 View  04/09/2013   *RADIOLOGY REPORT*  Clinical Data: Hypoglycemia and shortness of breath  PORTABLE CHEST - 1 VIEW  Comparison: Chest radiograph 03/23/2013  Findings: Stable enlarged cardiac silhouette.  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  There are low lung volumes and central venous congestion.  No pneumothorax.  Lung bases poorly evaluated  IMPRESSION: Decreased lung volumes and central venous congestion. Cardiomegaly.   Original Report Authenticated By: Genevive Bi, M.D.    Medications: Scheduled Meds: . antiseptic oral rinse  15 mL Mouth Rinse BID  . aspirin EC  325 mg Oral Daily  . atorvastatin  40 mg Oral q1800  . carvedilol  12.5 mg Oral BID WC  . darbepoetin (ARANESP) injection - NON-DIALYSIS  100 mcg Subcutaneous Q  Thu-1800  . docusate sodium  100 mg Oral BID  . enoxaparin (LOVENOX) injection  30 mg Subcutaneous Q24H  . famotidine  20 mg Oral Daily  . fentaNYL  50 mcg Intravenous Once  . furosemide  80 mg Oral Daily  . hydrocerin   Topical BID  . insulin aspart  0-5 Units Subcutaneous QHS  . insulin aspart  0-9 Units Subcutaneous TID WC  . insulin detemir  10 Units Subcutaneous Daily  . mycophenolate  500 mg Oral BID  . omega-3 acid ethyl esters  1 g Oral Daily  . predniSONE  5 mg Oral BID  . pyridOXINE  200 mg Oral Daily  . sodium chloride  3 mL Intravenous Q12H  . vitamin B-12  250 mcg Oral Daily  . vitamin C  500 mg Oral Daily      LOS: 11 days   Riham Polyakov M.D. [facility] 04/20/2013, 1:50 PM Pager: [phone]  If 7PM-7AM, please contact night-coverage [email or web address] Password [email or web address]

## 2013-04-21 LAB — GLUCOSE, CAPILLARY
Glucose-Capillary: 153 mg/dL — ABNORMAL HIGH (ref 70–99)
Glucose-Capillary: 149 mg/dL — ABNORMAL HIGH (ref 70–99)
Glucose-Capillary: 107 mg/dL — ABNORMAL HIGH (ref 70–99)

## 2013-04-21 LAB — BASIC METABOLIC PANEL
BUN: 39 mg/dL — ABNORMAL HIGH (ref 6–23)
Creatinine, Ser: 2.68 mg/dL — ABNORMAL HIGH (ref 0.50–1.35)
GFR calc Af Amer: 27 mL/min — ABNORMAL LOW (ref >90)
GFR calc non Af Amer: 24 mL/min — ABNORMAL LOW (ref >90)
Potassium: 4.3 mEq/L (ref 3.5–5.1)

## 2013-04-21 NOTE — Progress Notes (Signed)
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 Lovell Sheehan to follow - PT, OT rec'd CIR  SIRS (systemic inflammatory response syndrome) - resolved    Cellulitis of right lower extremity- improving    Renal failure (ARF), acute on chronic kidney disease stage 3 , history of renal transplant - Renal service following, on oral Lasix. Continue prednisone and CellCept  Demand ischemia/CAD (coronary artery disease) -no chest pain or acute dyspnea   -cont Coreg and ASA   Systolic Heart Failure/EF 30-35%/Grade 2 Diastolic Heart Failure  -baseline EF 25% in March 2013 after PEA arrest requiring PPM/ICD  -cont coreg, cards following   Large Pericardial Effusion - no evidence of tamponade -started on Colchicine per Cards, but d/t diarrhea, it was stopped.  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 c diff pcr negative, stool studies neg  DVT Prophylaxis:  Code Status:  Disposition: Inpatient rehabilitation    Subjective: Patient feels a whole lot better today, wants to try it inpatient  rehabilitation  Objective: Weight change: -0.9 kg (-1 lb 15.7 oz)  Intake/Output Summary (Last 24 hours) at 04/21/13 1202 Last data filed at 04/21/13 0900  Gross per 24 hour  Intake 1766.25 ml  Output   1605 ml  Net 161.25 ml   Blood pressure 148/80, pulse 85, temperature 98 F (36.7 C), temperature source Oral, resp. rate 18, height 6\' 1"  (1.854 m), weight 78.1 kg (172 lb 2.9 oz), SpO2 97.00%.  Physical Exam: General: Alert and awake, oriented x3, NAD CVS: S1-S2 clear, 2/6 murmur Chest: CTAB, no wheezing, rales or rhonchi Abdomen: soft nontender, nondistended, normal bowel sounds  Extremities: no cyanosis, clubbing, no significant edema noted bilaterally  Lab Results: Basic Metabolic Panel:  Recent Labs Lab 04/16/13 0535  04/20/13 0545 04/21/13 0510  NA 139  < > 139 136  K 4.1  < > 3.9 4.3  CL 97  < > 100 98  CO2 26  < > 22 26  GLUCOSE 100*  < > 128* 117*  BUN 51*  < > 38* 39*  CREATININE 3.07*  < > 2.58* 2.68*  CALCIUM 8.6  < > 8.8 8.8  MG 1.7  --   --   --   PHOS 4.7*  --   --   --   < > = values in this interval not displayed. Liver Function Tests:  Recent Labs Lab 04/16/13 0535 04/19/13 0400  AST  --  15  ALT  --  23  ALKPHOS  --  88  BILITOT  --  0.3  PROT  --  5.2*  ALBUMIN 2.2* 2.6*   No results found for this basename: LIPASE, AMYLASE,  in the last 168 hours No results found for this basename: AMMONIA,  in the last 168 hours CBC:  Recent Labs Lab 04/15/13 1015  04/19/13 0400 04/20/13 0545  WBC 7.2  < > 7.7 9.3  NEUTROABS 5.9  --   --   --   HGB 8.9*  < > 9.5* 9.8*  HCT 27.9*  < > 30.4* 31.6*  MCV 92.4  < > 94.4 95.2  PLT 122*  < > 166 162  < > = values in this interval not displayed. Cardiac Enzymes: No results found for this basename: CKTOTAL, CKMB, CKMBINDEX, TROPONINI,  in the last 168 hours BNP: No components found with this basename: POCBNP,  CBG:  Recent Labs Lab 04/20/13 0648 04/20/13 1208 04/20/13 1641 04/20/13 2119  04/21/13 0654  GLUCAP 113* 153* 205* 170* 116*     Micro Results: Recent Results (from the past 240 hour(s))  CLOSTRIDIUM DIFFICILE BY PCR     Status: None   Collection Time    04/16/13  2:38 PM      Result Value Range Status   C difficile by pcr NEGATIVE  NEGATIVE Final  STOOL CULTURE     Status: None   Collection Time    04/16/13  2:38 PM      Result Value Range Status   Specimen Description STOOL   Final   Special Requests NONE   Final   Culture     Final   Value: NO SALMONELLA, SHIGELLA, CAMPYLOBACTER, YERSINIA, OR E.COLI 0157:H7 ISOLATED     Note: REDUCED NORMAL FLORA PRESENT   Report Status 04/20/2013 FINAL   Final    Studies/Results: Dg Chest 2 View  03/23/2013   *RADIOLOGY REPORT*  Clinical Data: Preop evaluation for lumbar spine surgery diabetes, hypertension, pacemaker  CHEST - 2 VIEW  Comparison: 07/27/2012  Findings: Cardiomegaly noted without CHF or pneumonia.  No effusion or pneumothorax.  Left subclavian pacer evident.  Previous right PICC line removed. Atherosclerotic calcifications of the aorta  IMPRESSION: Cardiomegaly without CHF or pneumonia   Original Report Authenticated By: Judie Petit. Miles Costain, M.D.   Dg Lumbar Spine 2-3 Views  03/26/2013   *RADIOLOGY REPORT*  Clinical Data: Lumbar laminectomy  LUMBAR SPINE - 2-3 VIEW  Comparison: 05/04 12/04/2012  Findings: The first intraoperative lumbar radiograph shows a needle projecting between the L2 and L3 spinous processes.  Second film demonstrates surgical instruments projecting posterior to the   L3- 4 interspace.  IMPRESSION:  Intraoperative localization   Original Report Authenticated By: D. Andria Rhein, MD   Dg Hip Complete Left  04/16/2013   *RADIOLOGY REPORT*  Clinical Data: Fall.  Left hip and knee pain.  LEFT HIP - COMPLETE 2+ VIEW  Comparison: None.  Findings: Pelvic rings appear intact.  Right hip hemiarthroplasty. Atherosclerosis.  No displaced pelvic fracture.  Left hip chondrocalcinosis.  No left hip fracture.  No  significant joint space narrowing.  IMPRESSION: No acute osseous abnormality.  Chondrocalcinosis of the left hip, which may be associated with CPPD arthropathy or senile.   Original Report Authenticated By: Andreas Newport, M.D.   Ct Lumbar Spine Wo Contrast  04/10/2013   *RADIOLOGY REPORT*  Clinical Data: Confusion with shortness of breath and fever following L3 decompression 03/26/2013.  Question osteomyelitis.  CT LUMBAR SPINE WITHOUT CONTRAST  Technique:  Multidetector CT imaging of the lumbar spine was performed without intravenous contrast administration. Multiplanar CT image reconstructions were also  generated.  Comparison: Intraoperative radiographs 03/26/2013, lumbar myelogram CT 02/28/2013 and CT urogram 05/19/2008.  Findings: There are interval postsurgical changes at L3-L4 status post laminectomy and discectomy.  The spinal canal appears adequately decompressed posteriorly.  There is persistent annular disc bulging eccentric to the left with associated vacuum phenomenon.  There is persistent left foraminal stenosis and possible exiting L3 nerve root encroachment. Lesser foraminal narrowing is present on the right.  In addition, there is new inferior extension of high density and air within the anterior epidural space asymmetric to the left. This exerts mild mass effect on the thecal sac which is displaced posteriorly.  No anterior paraspinal inflammatory changes or fluid collections are identified.  There is no endplate destruction to suggest osteomyelitis.  Postsurgical changes are noted status post right renal transplant. 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  The native right kidney demonstrates moderate hydronephrosis, new from 2009 and suboptimally visualized on more recent studies. A calcified gallstone is again noted.  There is stable mild disc bulging at L1-L2 and L2-L3.  No spinal stenosis or nerve root encroachment results.  L3-L4: As above.  L4-L5:   Similar annular disc bulging with a small left paracentral disc protrusion and bilateral facet hypertrophy.  There is mild resulting left lateral recess stenosis.  No foraminal compromise or exiting L4 nerve root encroachment is identified.  L5-S1:  Stable annular disc bulging, central disc protrusion and associated vacuum phenomenon.  No sacral nerve root displacement or significant foraminal compromise is identified.  There is stable mild bilateral facet hypertrophy.  IMPRESSION:  1.  Interval postsurgical changes at L3-L4. An epidural air fluid collection extends inferiorly from the disc space behind the L4 vertebral body and exerts mass effect on the thecal sac.  As this appears contiguous with the L3-L4 disc which demonstrates vacuum phenomenon, this may reflect an extruded disc fragment.  I cannot exclude an epidural abscess, although no generalized inflammatory changes are identified. There is persistent foraminal narrowing at L3-L4, worse on the left. 2.  No CT evidence of osteomyelitis. 3.  Stable disc protrusions at L4-L5 and L5-S1. 4.  Apparent hydronephrosis of the native right renal collecting system of uncertain significance and etiology.   Original Report Authenticated By: Carey Bullocks, M.D.   Ct Lumbar Spine W Contrast  04/17/2013   *RADIOLOGY REPORT*  Clinical Data: Left leg pain post surgery.  LUMBAR MYELOGRAM AND POST MYELOGRAM CT  MYELOGRAM  LUMBAR  Technique: Order reviewed. As the patient has acute on top of chronic renal insufficiency, the need for the case was discussed with Dr. Gerlene Fee.  Dr. Randell Loop who is following the patient (nephrology service) agreed to low volume intrathecal contrast. Any available prior imaging reviewed.  The procedure and associated risks were reviewed with the patient (including but not limited to headache, bleeding, infection, contrast reaction, nondiagnostic study, nerve damage, all of which may require treatment).  All questions were answered.  Written and  oral witnessed consent was obtained.  Time out performed.  Under sterile technique, a  lumbar puncture was performed with a L2- 3 pass of a 22g needle.  Clear CSF was noted. 8cc of Omnipaque 180 was instilled.   Lumbar myelogram fluoroscopic images were obtained.  Post procedure instructions were reviewed with the patient.  Fluoroscopy time:  48 seconds  Comparison: 04/11/2011 CT and preoperative post myelogram 03/16/2011 CT.  Findings: Dextroscoliosis lumbar spine.  L3-4 disc space narrowing greater on the left.  Impression upon  the left lateral aspect of the thecal sac at the L3-4 level compressing the left L4 nerve root.  IMPRESSION: Dextroscoliosis lumbar spine.  L3-4 disc space narrowing greater on the left.  Impression upon the left lateral aspect of the thecal sac at the L3- 4 level compressing the left L4 nerve root.  Please see below for further detail.  CT MYELOGRAPHY LUMBAR SPINE  Technique: CT imaging of the lumbar spine was performed after intrathecal contrast administration.  Multiplanar CT image reconstructions were also generated.  Findings:  Last fully open disc space is labeled L5-S1.  Present examination incorporates from T12-L1 through the mid sacrum.  Atrophic kidneys more notable on the left.  Arising from the lateral aspect of the left kidney is a 3.2 cm mass which has fatty components.  On the 2009 CT, this measured 5.4 cm (appearing more fatty on the remote exam) and possibly represents treated mass or involuting angiomyolipoma. Clinical correlation recommended.  This is incompletely assessed on present exam.  Hydronephrotic right kidney.  Prominent atherosclerotic type changes of the aorta.  Small bulge L3 level measures 2.4 cm whereas just above this level aorta measures 2.1 cm.  Narrowing of the renal arteries and less so involving the iliac arteries, superior mesenteric and celiac artery.  Conus L1 level.  Dextroscoliosis lumbar spine with disc space narrowing most notable on the left at  the L3-4 level.  T12-L1:  Negative.  L1-2:  Negative.  L2-3:  Minimal facet joint degenerative changes.  L3-4:  Prominent disc degeneration with disc space narrowing more notable on the left.  Recent laminectomy/partial facetectomy. Facet joint degenerative changes.  Broad-based calcified disc protrusion extending into the neural foramen with prominent bilateral foraminal narrowing and mass effect upon the exiting L3 nerve roots greater on the left. Postsurgical changes surrounding narrowed thecal sac further contributing to moderate to marked thecal sac narrowing.  Additionally, extending caudally from the left posterior lateral aspect of the disc space is a large soft tissue and air containing collection which may represent extruded disc material reaching the L4-5 disc space and causing mass effect upon the thecal sac and left L4 nerve roots.  Epidural abscess not entirely excluded in the proper clinical setting although a secondary consideration.  This has increased in size since the prior postoperative examination.  L4-5:  Facet joint degenerative changes.  Prior left hemilaminectomy.  Bulge/broad-based protrusion.  Facet joint degenerative changes.  Ligamentum flavum hypertrophy.  Mild to moderate left-sided and mild right-sided lateral recess stenosis. Mild spinal stenosis.  Mild bilateral foraminal narrowing.  L5-S1:  Facet joint degenerative changes.  Disc degeneration with broad-based slightly caudally extending disc protrusion which touches but does not cause significant mass effect upon the S1 nerve roots or thecal sac.  Lateral extension greater to the right with mild encroaching upon the exiting right L5 nerve root.  IMPRESSION: L3-4 recent laminectomy/partial facetectomy. Broad-based calcified disc protrusion extending into the neural foramen with prominent bilateral foraminal narrowing and mass effect upon the exiting L3 nerve roots greater on the left. Postsurgical changes surrounding narrowed thecal  sac further contributing to moderate to marked thecal sac narrowing.  Additionally, extending caudally from the left posterior lateral aspect of the disc space is a large soft tissue and air containing collection which may represent extruded disc material reaching the L4-5 disc space and causing mass effect upon the thecal sac and left L4 nerve roots.  Epidural abscess not entirely excluded in the proper clinical setting although a secondary consideration.  This has increased in size since the prior postoperative examination.  L4-5 multifactorial mild to moderate left-sided and mild right- sided lateral recess stenosis.  Mild spinal stenosis.  Mild bilateral foraminal narrowing.  L5-S broad-based slightly caudally extending disc protrusion which touches but does not cause significant mass effect upon the S1 nerve roots or thecal sac.  Lateral extension greater to the right with mild encroaching upon the exiting right L5 nerve root.  Atrophic kidneys more notable on the left.  Arising from the lateral aspect of the left kidney is a 3.2 cm mass which has fatty components.  On the 2009 CT, this measured 5.4 cm (appearing more fatty on the remote exam) and possibly represents treated mass or involuting angiomyolipoma. Clinical correlation recommended.  This is incompletely assessed on present exam.  Hydronephrotic right kidney.  Prominent atherosclerotic type changes of the aorta.  Small bulge L3 level measures 2.4 cm whereas just above this level aorta measures 2.1 cm.  Narrowing of the renal arteries and less so involving the iliac arteries, superior mesenteric and celiac artery.  This has been made a PRA call report utilizing dashboard call feature.   Original Report Authenticated By: Lacy Duverney, M.D.   Ct Knee Right Wo Contrast  04/10/2013   *RADIOLOGY REPORT*  Clinical Data: Knee swelling and fever.  Question osteomyelitis.  CT OF THE RIGHT KNEE WITHOUT CONTRAST  Technique:  Multidetector CT imaging was  performed according to the standard protocol. Multiplanar CT image reconstructions were also generated.  Comparison: None.  Findings: No bony destructive change or focal lesion is identified. Mild chondrocalcinosis is noted.  The patient has only a small joint effusion.  There is diffuse subcutaneous edema about the knee without focal subcutaneous fluid collection.  Mild degenerative change is seen in the medial compartment.  Atherosclerotic vascular disease is noted.  IMPRESSION:  1.  No CT evidence of osteomyelitis.  Please note that CT scan is not sensitive for the detection of osteomyelitis. 2.  Small joint effusion.  No focal fluid collection is present. 3.  Subcutaneous edema diffusely about the knee is likely due to dependent change or possibly cellulitis. 4.  Atherosclerosis.   Original Report Authenticated By: Holley Dexter, M.D.   Dg Myelogram Lumbar  04/17/2013   *RADIOLOGY REPORT*  Clinical Data: Left leg pain post surgery.  LUMBAR MYELOGRAM AND POST MYELOGRAM CT  MYELOGRAM  LUMBAR  Technique: Order reviewed. As the patient has acute on top of chronic renal insufficiency, the need for the case was discussed with Dr. Gerlene Fee.  Dr. Randell Loop who is following the patient (nephrology service) agreed to low volume intrathecal contrast. Any available prior imaging reviewed.  The procedure and associated risks were reviewed with the patient (including but not limited to headache, bleeding, infection, contrast reaction, nondiagnostic study, nerve damage, all of which may require treatment).  All questions were answered.  Written and oral witnessed consent was obtained.  Time out performed.  Under sterile technique, a  lumbar puncture was performed with a L2- 3 pass of a 22g needle.  Clear CSF was noted. 8cc of Omnipaque 180 was instilled.   Lumbar myelogram fluoroscopic images were obtained.  Post procedure instructions were reviewed with the patient.  Fluoroscopy time:  48 seconds  Comparison: 04/11/2011  CT and preoperative post myelogram 03/16/2011 CT.  Findings: Dextroscoliosis lumbar spine.  L3-4 disc space narrowing greater on the left.  Impression upon the left lateral aspect of the thecal sac at the L3-4 level compressing the left  L4 nerve root.  IMPRESSION: Dextroscoliosis lumbar spine.  L3-4 disc space narrowing greater on the left.  Impression upon the left lateral aspect of the thecal sac at the L3- 4 level compressing the left L4 nerve root.  Please see below for further detail.  CT MYELOGRAPHY LUMBAR SPINE  Technique: CT imaging of the lumbar spine was performed after intrathecal contrast administration.  Multiplanar CT image reconstructions were also generated.  Findings:  Last fully open disc space is labeled L5-S1.  Present examination incorporates from T12-L1 through the mid sacrum.  Atrophic kidneys more notable on the left.  Arising from the lateral aspect of the left kidney is a 3.2 cm mass which has fatty components.  On the 2009 CT, this measured 5.4 cm (appearing more fatty on the remote exam) and possibly represents treated mass or involuting angiomyolipoma. Clinical correlation recommended.  This is incompletely assessed on present exam.  Hydronephrotic right kidney.  Prominent atherosclerotic type changes of the aorta.  Small bulge L3 level measures 2.4 cm whereas just above this level aorta measures 2.1 cm.  Narrowing of the renal arteries and less so involving the iliac arteries, superior mesenteric and celiac artery.  Conus L1 level.  Dextroscoliosis lumbar spine with disc space narrowing most notable on the left at the L3-4 level.  T12-L1:  Negative.  L1-2:  Negative.  L2-3:  Minimal facet joint degenerative changes.  L3-4:  Prominent disc degeneration with disc space narrowing more notable on the left.  Recent laminectomy/partial facetectomy. Facet joint degenerative changes.  Broad-based calcified disc protrusion extending into the neural foramen with prominent bilateral foraminal  narrowing and mass effect upon the exiting L3 nerve roots greater on the left. Postsurgical changes surrounding narrowed thecal sac further contributing to moderate to marked thecal sac narrowing.  Additionally, extending caudally from the left posterior lateral aspect of the disc space is a large soft tissue and air containing collection which may represent extruded disc material reaching the L4-5 disc space and causing mass effect upon the thecal sac and left L4 nerve roots.  Epidural abscess not entirely excluded in the proper clinical setting although a secondary consideration.  This has increased in size since the prior postoperative examination.  L4-5:  Facet joint degenerative changes.  Prior left hemilaminectomy.  Bulge/broad-based protrusion.  Facet joint degenerative changes.  Ligamentum flavum hypertrophy.  Mild to moderate left-sided and mild right-sided lateral recess stenosis. Mild spinal stenosis.  Mild bilateral foraminal narrowing.  L5-S1:  Facet joint degenerative changes.  Disc degeneration with broad-based slightly caudally extending disc protrusion which touches but does not cause significant mass effect upon the S1 nerve roots or thecal sac.  Lateral extension greater to the right with mild encroaching upon the exiting right L5 nerve root.  IMPRESSION: L3-4 recent laminectomy/partial facetectomy. Broad-based calcified disc protrusion extending into the neural foramen with prominent bilateral foraminal narrowing and mass effect upon the exiting L3 nerve roots greater on the left. Postsurgical changes surrounding narrowed thecal sac further contributing to moderate to marked thecal sac narrowing.  Additionally, extending caudally from the left posterior lateral aspect of the disc space is a large soft tissue and air containing collection which may represent extruded disc material reaching the L4-5 disc space and causing mass effect upon the thecal sac and left L4 nerve roots.  Epidural abscess  not entirely excluded in the proper clinical setting although a secondary consideration.  This has increased in size since the prior postoperative examination.  L4-5 multifactorial mild to  moderate left-sided and mild right- sided lateral recess stenosis.  Mild spinal stenosis.  Mild bilateral foraminal narrowing.  L5-S broad-based slightly caudally extending disc protrusion which touches but does not cause significant mass effect upon the S1 nerve roots or thecal sac.  Lateral extension greater to the right with mild encroaching upon the exiting right L5 nerve root.  Atrophic kidneys more notable on the left.  Arising from the lateral aspect of the left kidney is a 3.2 cm mass which has fatty components.  On the 2009 CT, this measured 5.4 cm (appearing more fatty on the remote exam) and possibly represents treated mass or involuting angiomyolipoma. Clinical correlation recommended.  This is incompletely assessed on present exam.  Hydronephrotic right kidney.  Prominent atherosclerotic type changes of the aorta.  Small bulge L3 level measures 2.4 cm whereas just above this level aorta measures 2.1 cm.  Narrowing of the renal arteries and less so involving the iliac arteries, superior mesenteric and celiac artery.  This has been made a PRA call report utilizing dashboard call feature.   Original Report Authenticated By: Lacy Duverney, M.D.   US Renal  04/12/2013   *RADIOLOGY REPORT*  Clinical Data: Kidney transplant with elevated creatinine. Evaluate for hydronephrosis. History of fluid overload.  RENAL/URINARY TRACT ULTRASOUND COMPLETE  Comparison:  Lumbar spine CT 04/10/2013 and abdominal CT 05/19/2008  Findings:  Right Kidney:  The native right kidney is small and echogenic.  The right kidney measures 7.7 cm. Difficult to exclude hydronephrosis in the native right kidney.  Cyst in the right kidney measures up to 2.5 cm.  Left Kidney:  Atrophic and echogenic.  Left kidney roughly measures 7.5 cm in length.  Lower  pole is difficult to evaluate.  Transplant kidney: The transplant kidney measures 12.4 cm in length.  There are hypoechoic cysts associated with the transplant kidney and a small amount of adjacent ascites.  Largest cyst roughly measures 2.7 cm.  Bladder:  Fluid filled and unremarkable.  Other findings:  There are bilateral pleural effusions. Evidence for an echogenic stone with posterior acoustic shadowing at the base of the gallbladder.  IMPRESSION: No significant hydronephrosis involving the transplant kidney. There appears to be cortical cysts involving the transplant kidney as described.  Bilateral native kidneys are atrophic and echogenic as expected. Evidence for bilateral renal cortical cysts. Difficult to exclude hydronephrosis of the native right kidney.  Bilateral pleural effusions and ascites.  Cholelithiasis.   Original Report Authenticated By: Richarda Overlie, M.D.   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  Perfusion images were obtained in multiple projections after intravenous injection of Tc-75m MAA.  Radiopharmaceuticals:  Tc-53m DTPA aerosol and 6 mCi Tc-39m MAA.  Comparison: None Correlation:  Chest radiograph 04/09/2013  Findings:  Ventilation:  Absent ventilation in left lower lobe.  Enlargement of cardiac silhouette.  Mildly diminished ventilation in lung apices.  Perfusion:  Cardiomegaly.  Diminished perfusion throughout left lower lobe suspect related to the enlargement of cardiac silhouette, less severe than accompanying ventilatory loss. Minimal diminished perfusion in the left upper lobe matching ventilatory findings. No additional segmental or subsegmental perfusion  defects.  IMPRESSION: Very low probability for pulmonary embolism.   Original Report Authenticated By: [name],  M.D.   Dg Chest Port 1 View  04/11/2013   *RADIOLOGY REPORT*  Clinical Data: Shortness of breath, evaluate for volume overload  PORTABLE CHEST - 1 VIEW  Comparison: 04/09/2013; 07/27/2012  Findings: Grossly unchanged marked enlargement of the cardiac silhouette and mediastinal contours.  Stable positioning of support apparatus.  Chronic mild pulmonary venous congestion without frank evidence of edema.  Grossly unchanged bilateral medial basilar heterogeneous opacities.  No new focal airspace opacities.  No definite pleural effusion or pneumothorax.  Unchanged bones.  IMPRESSION:  Chronic findings of cardiomegaly and pulmonary venous congestion without definite evidence of acute superimposed worsening pulmonary edema.   Original Report Authenticated By: Tacey Ruiz, MD   Dg Chest Port 1 View  04/09/2013   *RADIOLOGY REPORT*  Clinical Data: Hypoglycemia and shortness of breath  PORTABLE CHEST - 1 VIEW  Comparison: Chest radiograph 03/23/2013  Findings: Stable enlarged cardiac silhouette.  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  There are low lung volumes and central venous congestion.  No pneumothorax.  Lung bases poorly evaluated  IMPRESSION: Decreased lung volumes and central venous congestion. Cardiomegaly.   Original Report Authenticated By: Genevive Bi, M.D.    Medications: Scheduled Meds: . antiseptic oral rinse  15 mL Mouth Rinse BID  . aspirin EC  325 mg Oral Daily  . atorvastatin  40 mg Oral q1800  . carvedilol  12.5 mg Oral BID WC  . darbepoetin (ARANESP) injection - NON-DIALYSIS  100 mcg Subcutaneous Q Thu-1800  . docusate sodium  100 mg Oral BID  . enoxaparin (LOVENOX) injection  30 mg Subcutaneous Q24H  . famotidine  20 mg Oral Daily  . fentaNYL  50 mcg Intravenous Once  . furosemide  80 mg Oral Daily  . hydrocerin   Topical BID  . insulin aspart  0-5 Units Subcutaneous  QHS  . insulin aspart  0-9 Units Subcutaneous TID WC  . insulin detemir  10 Units Subcutaneous Daily  . mycophenolate  500 mg Oral BID  . omega-3 acid ethyl esters  1 g Oral Daily  . predniSONE  5 mg Oral BID  . pyridOXINE  200 mg Oral Daily  . sodium chloride  3 mL Intravenous Q12H  . vitamin B-12  250 mcg Oral Daily  . vitamin C  500 mg Oral Daily      LOS: 12 days   Ryka Beighley M.D. [facility] 04/21/2013, 12:02 PM Pager: [phone]  If 7PM-7AM, please contact night-coverage [email or web address] Password [email or web address]

## 2013-04-21 NOTE — Progress Notes (Signed)
Physical Therapy Treatment Patient Details Name: [name] MRN: [id number] DOB: [birth year] Today's Date: 04/21/2013 Time: 9147-8295 PT Time Calculation (min): 19 min  PT Assessment / Plan / Recommendation Comments on Treatment Session  Pt reports he feels stronger today however only able to increase ambulation distance short distance today compared to last PT session.  Cont to recommend CIR at d/c for further strengthening, gait training, & overall mobility to prepare for safe d/c home.      Follow Up Recommendations  CIR     Does the patient have the potential to tolerate intense rehabilitation     Barriers to Discharge        Equipment Recommendations  Rolling walker with 5" wheels    Recommendations for Other Services Rehab consult  Frequency Min 5X/week   Plan Discharge plan remains appropriate;Frequency remains appropriate    Precautions / Restrictions Precautions Precautions: Back;Fall Required Braces or Orthoses: Other Brace/Splint Other Brace/Splint: no back brace; Rt AFO Restrictions Weight Bearing Restrictions: No       Mobility  Bed Mobility Bed Mobility: Rolling Right;Right Sidelying to Sit;Sitting - Scoot to Edge of Bed Rolling Right: 4: Min assist;With rail Right Sidelying to Sit: 4: Min assist;With rails Details for Bed Mobility Assistance: Cues for sequencing & technique.  (A) to maintain back precautions during transitional movements Transfers Transfers: Sit to Stand;Stand to Sit Sit to Stand: 4: Min assist;3: Mod assist;With armrests;From bed;From chair/3-in-1;With upper extremity assist Stand to Sit: 4: Min assist;With upper extremity assist;With armrests;To chair/3-in-1 Details for Transfer Assistance: Mod (A) to achieve standing from bed for balance & safety.  Min (A) to stand from recliner.  Cues to prevent excessive flexion.   Ambulation/Gait Ambulation/Gait Assistance: 4: Min assist Ambulation Distance (Feet): 30 Feet Assistive device: Rolling  walker Ambulation/Gait Assistance Details: Cues for posture & body positioning inside RW.   Pt tends to lean towards Rt with Lt shoulder elevated.  Gait Pattern: Step-through pattern;Decreased stride length;Trunk flexed;Narrow base of support;Right flexed knee in stance;Left flexed knee in stance General Gait Details: Pt with heavy reliance on arms on walker for gait. Stairs: No Wheelchair Mobility Wheelchair Mobility: No      PT Goals Acute Rehab PT Goals Time For Goal Achievement: 04/27/13 Potential to Achieve Goals: Good Pt will go Supine/Side to Sit: with supervision PT Goal: Supine/Side to Sit - Progress: Progressing toward goal Pt will go Sit to Supine/Side: with supervision Pt will go Sit to Stand: with supervision PT Goal: Sit to Stand - Progress: Progressing toward goal Pt will go Stand to Sit: with supervision PT Goal: Stand to Sit - Progress: Progressing toward goal Pt will Transfer Bed to Chair/Chair to Bed: with min assist Pt will Ambulate: 51 - 150 feet;with supervision;with least restrictive assistive device PT Goal: Ambulate - Progress: Progressing toward goal  Visit Information  Last PT Received On: 04/21/13 Assistance Needed: +1    Subjective Data  Patient Stated Goal: Return home   Cognition  Cognition Arousal/Alertness: Awake/alert Behavior During Therapy: WFL for tasks assessed/performed Overall Cognitive Status: Within Functional Limits for tasks assessed    Balance     End of Session PT - End of Session Equipment Utilized During Treatment: Gait belt Activity Tolerance: Patient limited by fatigue Patient left: in chair;with call bell/phone within reach Nurse Communication: Mobility status     [name], [place] [phone] 04/21/2013

## 2013-04-21 NOTE — Progress Notes (Signed)
Patient ID: [name], male   DOB: Jul 08, 1946, 67 y.o.   MRN: [id number] S:feels much better but still work O:BP 148/80  Pulse 85  Temp(Src) 98 F (36.7 C) (Oral)  Resp 18  Ht 6\' 1"  (1.854 m)  Wt 78.1 kg (172 lb 2.9 oz)  BMI 22.72 kg/m2  SpO2 97%  Intake/Output Summary (Last 24 hours) at 04/21/13 1058 Last data filed at 04/21/13 0900  Gross per 24 hour  Intake 1766.25 ml  Output   1605 ml  Net 161.25 ml   Intake/Output: I/O last 3 completed shifts: In: 2246.3 [P.O.:600; I.V.:1646.3] Out: 1880 [Urine:1880]  Intake/Output this shift:  Total I/O In: -  Out: 330 [Urine:330] Weight change: -0.9 kg (-1 lb 15.7 oz) Gen:WD WN in nad CVS:no rub Resp:cta WUJ:WJXBJY Ext:tr edema   Recent Labs Lab 04/16/13 0535 04/17/13 0405 04/18/13 0505 04/19/13 0400 04/20/13 0545 04/21/13 0510  NA 139 138 140 137 139 136  K 4.1 3.9 3.7 3.6 3.9 4.3  CL 97 97 100 99 100 98  CO2 26 28 27 27 22 26   GLUCOSE 100* 133* 142* 172* 128* 117*  BUN 51* 51* 45* 41* 38* 39*  CREATININE 3.07* 3.18* 3.05* 2.74* 2.58* 2.68*  ALBUMIN 2.2*  --   --  2.6*  --   --   CALCIUM 8.6 8.9 8.9 8.7 8.8 8.8  PHOS 4.7*  --   --   --   --   --   AST  --   --   --  15  --   --   ALT  --   --   --  23  --   --    Liver Function Tests:  Recent Labs Lab 04/16/13 0535 04/19/13 0400  AST  --  15  ALT  --  23  ALKPHOS  --  88  BILITOT  --  0.3  PROT  --  5.2*  ALBUMIN 2.2* 2.6*   No results found for this basename: LIPASE, AMYLASE,  in the last 168 hours No results found for this basename: AMMONIA,  in the last 168 hours CBC:  Recent Labs Lab 04/15/13 1015 04/18/13 0505 04/19/13 0400 04/20/13 0545  WBC 7.2 9.7 7.7 9.3  NEUTROABS 5.9  --   --   --   HGB 8.9* 9.3* 9.5* 9.8*  HCT 27.9* 30.2* 30.4* 31.6*  MCV 92.4 93.5 94.4 95.2  PLT 122* 172 166 162   Cardiac Enzymes: No results found for this basename: CKTOTAL, CKMB, CKMBINDEX, TROPONINI,  in the last 168 hours CBG:  Recent Labs Lab  04/20/13 0648 04/20/13 1208 04/20/13 1641 04/20/13 2119 04/21/13 0654  GLUCAP 113* 153* 205* 170* 116*    Iron Studies: No results found for this basename: IRON, TIBC, TRANSFERRIN, FERRITIN,  in the last 72 hours Studies/Results: Dg Lumbar Spine 1 View  04/19/2013   *RADIOLOGY REPORT*  Clinical Data: Lumbar laminectomy.  LUMBAR SPINE - 1 VIEW  Comparison: None.  Findings: Single lateral intraoperative localizer is submitted for interpretation.  There is a probe dorsal to the pedicles of the L4 vertebra.  Vacuum disc at L3-L4 is used as a reference.  Numbering convention used on prior exam 04/17/2013 is again used.  IMPRESSION: Intraoperative localization above.   Original Report Authenticated By: Andreas Newport, M.D.   . antiseptic oral rinse  15 mL Mouth Rinse BID  . aspirin EC  325 mg Oral Daily  . atorvastatin  40 mg Oral q1800  . carvedilol  12.5 mg Oral BID WC  . darbepoetin (ARANESP) injection - NON-DIALYSIS  100 mcg Subcutaneous Q Thu-1800  . docusate sodium  100 mg Oral BID  . enoxaparin (LOVENOX) injection  30 mg Subcutaneous Q24H  . famotidine  20 mg Oral Daily  . fentaNYL  50 mcg Intravenous Once  . furosemide  80 mg Oral Daily  . hydrocerin   Topical BID  . insulin aspart  0-5 Units Subcutaneous QHS  . insulin aspart  0-9 Units Subcutaneous TID WC  . insulin detemir  10 Units Subcutaneous Daily  . mycophenolate  500 mg Oral BID  . omega-3 acid ethyl esters  1 g Oral Daily  . predniSONE  5 mg Oral BID  . pyridOXINE  200 mg Oral Daily  . sodium chloride  3 mL Intravenous Q12H  . vitamin B-12  250 mcg Oral Daily  . vitamin C  500 mg Oral Daily    BMET    Component Value Date/Time   NA 136 04/21/2013 0510   K 4.3 04/21/2013 0510   CL 98 04/21/2013 0510   CO2 26 04/21/2013 0510   GLUCOSE 117* 04/21/2013 0510   BUN 39* 04/21/2013 0510   CREATININE 2.68* 04/21/2013 0510   CALCIUM 8.8 04/21/2013 0510   GFRNONAA 24* 04/21/2013 0510   GFRAA 27* 04/21/2013 0510   CBC    Component  Value Date/Time   WBC 9.3 04/20/2013 0545   RBC 3.32* 04/20/2013 0545   HGB 9.8* 04/20/2013 0545   HCT 31.6* 04/20/2013 0545   PLT 162 04/20/2013 0545   MCV 95.2 04/20/2013 0545   MCH 29.5 04/20/2013 0545   MCHC 31.0 04/20/2013 0545   RDW 17.9* 04/20/2013 0545   LYMPHSABS 0.9 04/15/2013 1015   MONOABS 0.4 04/15/2013 1015   EOSABS 0.1 04/15/2013 1015   BASOSABS 0.0 04/15/2013 1015     Assessment/Plan:  1. 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 AKI likely related to aggressive diuresis and volume depletion due to diarrhea.  1. Steady improvement in Scr since being switched over to po lasix daily rather than BID. 2. S/p CT myelogram without increase in Scr. Cont to follow.  3. Scr near baseline.  Will decrease frequency of lab draws 2. CHF- markedly improved volume status. EF 30-35%. On lower dose of po lasix (will hold pm dose today due to rise in Scr). Continues to diurese 3. Pericardial effusion without tamponade. Will need repeat ECHO 4. Left hip pain- CT myelogram revealed soft tissue mass in L4-5 territory (possible extruded material from disk impinging upon the thecal sac.  1. S/p disckectomy with Dr. Lovell Sheehan of Neurosurgery and doing well. 5. DM- stable 6. Diarrhea/Nausea- may benefit from immodium or lomotil. Likely related to ongoing abx. Consider probiotics.  7. H/o Vfib arrest, s/p AICD 8. Immunosuppressive therapy- cont with MMF and pred 9. Cellulitis- off Abx with improvement of diarrhea (completed 1 week of IV zosyn) 10. Anemia- normocytic. May benefit from ESA. Will cont to follow 11. Dispo- awaiting CIR placement 12. Will see him again on 04/23/13  Kyston Gonce A

## 2013-04-21 NOTE — Progress Notes (Signed)
Patient ID: [name], male   DOB: [birth year], 67 y.o.   MRN: [id number] Wound dry, minimal incisional pain. Waiting transfer to rehab

## 2013-04-22 DIAGNOSIS — Z9889 Other specified postprocedural states: Secondary | ICD-10-CM

## 2013-04-22 LAB — GLUCOSE, CAPILLARY
Glucose-Capillary: 98 mg/dL (ref 70–99)
Glucose-Capillary: 122 mg/dL — ABNORMAL HIGH (ref 70–99)
Glucose-Capillary: 254 mg/dL — ABNORMAL HIGH (ref 70–99)

## 2013-04-22 MED ORDER — FAMOTIDINE 20 MG PO TABS
20.0000 mg | ORAL_TABLET | Freq: Every day | ORAL | Status: DC
  Administered 2013-04-22 – 2013-04-23 (×2): 20 mg via ORAL
  Filled 2013-04-22 (×2): qty 1

## 2013-04-22 MED ORDER — METOCLOPRAMIDE HCL 5 MG PO TABS
5.0000 mg | ORAL_TABLET | Freq: Three times a day (TID) | ORAL | Status: DC
  Administered 2013-04-22 – 2013-04-23 (×3): 5 mg via ORAL
  Filled 2013-04-22 (×6): qty 1

## 2013-04-22 MED ORDER — PANTOPRAZOLE SODIUM 40 MG PO TBEC: 40.0000 mg | DELAYED_RELEASE_TABLET | Freq: Every day | ORAL | Status: DC

## 2013-04-22 NOTE — Progress Notes (Addendum)
Physical Therapy Treatment Patient Details Name: [name] MRN: [id number] DOB: 06-03-1946 Today's Date: 04/22/2013 Time: 0812-0836 PT Time Calculation (min): 24 min  PT Assessment / Plan / Recommendation Comments on Treatment Session  Pt very pleasant & motivated to participate & progress.  Pt is making progress with mobiltiy but he fatigues very quickly & demonstrates LE weakness as evident in requiring assistance to perform exercises.  Pt is very hopeful for CIR to maximize functional recovery & independence prior to d/c home with his wife.      Follow Up Recommendations  CIR     Does the patient have the potential to tolerate intense rehabilitation     Barriers to Discharge        Equipment Recommendations  Rolling walker with 5" wheels    Recommendations for Other Services    Frequency Min 5X/week   Plan Discharge plan remains appropriate;Frequency remains appropriate    Precautions / Restrictions Precautions Precautions: Back;Fall Required Braces or Orthoses: Other Brace/Splint Other Brace/Splint: no back brace; Rt AFO Restrictions Weight Bearing Restrictions: No   Pertinent Vitals/Pain 5/10 low back.      Mobility  Bed Mobility Bed Mobility: Rolling Right;Right Sidelying to Sit Rolling Right: 5: Supervision;With rail Right Sidelying to Sit: 5: Supervision;With rails Details for Bed Mobility Assistance: Pt unable to tolerate HOB 0 degrees due to pain.  Heavy reliance on rail with UE's.  Cues to reinforce technique.   Transfers Transfers: Sit to Stand;Stand to Sit Sit to Stand: 4: Min assist Stand to Sit: With upper extremity assist;With armrests;To chair/3-in-1;4: Min assist Details for Transfer Assistance: (A) for stability & controlled descent.  Pt unsteady with achieving standing Ambulation/Gait Ambulation/Gait Assistance: 4: Min guard Ambulation Distance (Feet): 50 Feet Assistive device: Rolling walker Ambulation/Gait Assistance Details: Heavy reliance on  RW 2/2 LE weakness & fatigue per pt.  Cues for posture & encouragement to relax UE's.   Gait Pattern: Step-through pattern;Decreased stride length;Decreased hip/knee flexion - right;Decreased hip/knee flexion - left;Narrow base of support;Right flexed knee in stance;Left flexed knee in stance Gait velocity: decreased General Gait Details: Fatigues quickly Stairs: No Wheelchair Mobility Wheelchair Mobility: No    Exercises General Exercises - Lower Extremity Long Arc Quad: AROM;AAROM;Both;10 reps;Seated Heel Slides: AROM;AAROM;Both;5 reps;Supine Hip ABduction/ADduction: AAROM;Both;10 reps Hip Flexion/Marching: AROM;AAROM;Both;10 reps;Seated **Increased assistance needed for LLE vs RLE**    PT Goals Acute Rehab PT Goals Time For Goal Achievement: 04/27/13 Potential to Achieve Goals: Good Pt will go Supine/Side to Sit: with supervision PT Goal: Supine/Side to Sit - Progress: Progressing toward goal Pt will go Sit to Supine/Side: with supervision Pt will go Sit to Stand: with supervision PT Goal: Sit to Stand - Progress: Progressing toward goal Pt will go Stand to Sit: with supervision PT Goal: Stand to Sit - Progress: Progressing toward goal Pt will Transfer Bed to Chair/Chair to Bed: with min assist Pt will Ambulate: 51 - 150 feet;with supervision;with least restrictive assistive device PT Goal: Ambulate - Progress: Progressing toward goal  Visit Information  Last PT Received On: 04/22/13    Subjective Data      Cognition  Cognition Arousal/Alertness: Awake/alert Behavior During Therapy: [facility] for tasks assessed/performed Overall Cognitive Status: Within Functional Limits for tasks assessed    Balance     End of Session PT - End of Session Equipment Utilized During Treatment: Gait belt (R AFO) Activity Tolerance: Patient limited by fatigue Patient left: in chair;with call bell/phone within reach Nurse Communication: Mobility status  [name],  [place] [phone] 04/22/2013

## 2013-04-22 NOTE — Progress Notes (Signed)
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 Tolerating diet very well. Continue Pepcid, added Reglan, continue as needed Zofran   SIRS (systemic inflammatory response syndrome) - resolved    Cellulitis of right lower extremity- improving    Renal failure (ARF), acute on chronic kidney disease stage 3 , history of renal transplant - Renal service following, on oral Lasix. Continue prednisone and CellCept  Demand ischemia/CAD (coronary artery disease) -no chest pain or acute dyspnea   -cont Coreg and ASA   Systolic Heart Failure/EF 30-35%/Grade 2 Diastolic Heart Failure  -baseline EF 25% in March 2013 after PEA arrest requiring PPM/ICD  -cont coreg, cards following   Large Pericardial Effusion - no evidence of tamponade -started on Colchicine per Cards, but d/t diarrhea, it was stopped.  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 c diff pcr negative, stool studies  neg  DVT Prophylaxis:  Code Status:  Disposition: Inpatient rehabilitation , Likely DC tomorrow if beds available   Subjective: Complaining of nausea, otherwise doing very well, looking forward to DC tomorrow to inpatient rehabilitation if beds available  Objective: Weight change: -1.5 kg (-3 lb 4.9 oz)  Intake/Output Summary (Last 24 hours) at 04/22/13 1034 Last data filed at 04/22/13 0610  Gross per 24 hour  Intake    360 ml  Output   1700 ml  Net  -1340 ml   Blood pressure 141/71, pulse 78, temperature 98 F (36.7 C), temperature source Oral, resp. rate 18, height 6\' 1"  (1.854 m), weight 76.6 kg (168 lb 14 oz), SpO2 100.00%.  Physical Exam: General: Alert and awake, oriented x3, NAD CVS: S1-S2 clear, 2/6 murmur Chest: CTAB Abdomen: soft nontender, nondistended, normal bowel sounds  Extremities: no c/c/e bilaterally  Lab Results: Basic Metabolic Panel:  Recent Labs Lab 04/16/13 0535  04/20/13 0545 04/21/13 0510  NA 139  < > 139 136  K 4.1  < > 3.9 4.3  CL 97  < > 100 98  CO2 26  < > 22 26  GLUCOSE 100*  < > 128* 117*  BUN 51*  < > 38* 39*  CREATININE 3.07*  < > 2.58* 2.68*  CALCIUM 8.6  < > 8.8 8.8  MG 1.7  --   --   --   PHOS 4.7*  --   --   --   < > = values in this interval not displayed. Liver Function Tests:  Recent Labs Lab 04/16/13 0535 04/19/13 0400  AST  --  15  ALT  --  23  ALKPHOS  --  88  BILITOT  --  0.3  PROT  --  5.2*  ALBUMIN 2.2* 2.6*   No results found for this basename: LIPASE, AMYLASE,  in the last 168 hours No results found for this basename: AMMONIA,  in the last 168 hours CBC:  Recent Labs Lab 04/19/13 0400 04/20/13 0545  WBC 7.7 9.3  HGB 9.5* 9.8*  HCT 30.4* 31.6*  MCV 94.4 95.2  PLT 166 162   Cardiac Enzymes: No results found for this basename: CKTOTAL, CKMB, CKMBINDEX, TROPONINI,  in the last 168 hours BNP: No components found with this basename: POCBNP,  CBG:  Recent Labs Lab 04/21/13 0654  04/21/13 1214 04/21/13 1644 04/21/13 2323 04/22/13 0626  GLUCAP 116* 153* 149* 107* 98     Micro Results: Recent Results (from the past 240 hour(s))  CLOSTRIDIUM DIFFICILE BY PCR     Status: None   Collection Time    04/16/13  2:38 PM      Result Value Range Status   C difficile by pcr NEGATIVE  NEGATIVE Final  STOOL CULTURE     Status: None   Collection Time    04/16/13  2:38 PM      Result Value Range Status   Specimen Description STOOL   Final   Special Requests NONE   Final   Culture     Final   Value: NO SALMONELLA, SHIGELLA, CAMPYLOBACTER, YERSINIA, OR E.COLI 0157:H7 ISOLATED     Note: REDUCED NORMAL FLORA PRESENT   Report Status 04/20/2013 FINAL   Final    Studies/Results: Dg Chest 2 View  03/23/2013   *RADIOLOGY REPORT*  Clinical Data: Preop evaluation for lumbar spine surgery diabetes, hypertension, pacemaker  CHEST - 2 VIEW  Comparison: 07/27/2012  Findings: Cardiomegaly noted without CHF or pneumonia.  No effusion or pneumothorax.  Left subclavian pacer evident.  Previous right PICC line removed. Atherosclerotic calcifications of the aorta  IMPRESSION: Cardiomegaly without CHF or pneumonia   Original Report Authenticated By: Judie Petit. Miles Costain, M.D.   Dg Lumbar Spine 2-3 Views  03/26/2013   *RADIOLOGY REPORT*  Clinical Data: Lumbar laminectomy  LUMBAR SPINE - 2-3 VIEW  Comparison: 05/04 12/04/2012  Findings: The first intraoperative lumbar radiograph shows a needle projecting between the L2 and L3 spinous processes.  Second film demonstrates surgical instruments projecting posterior to the   L3- 4 interspace.  IMPRESSION:  Intraoperative localization   Original Report Authenticated By: D. Andria Rhein, MD   Dg Hip Complete Left  04/16/2013   *RADIOLOGY REPORT*  Clinical Data: Fall.  Left hip and knee pain.  LEFT HIP - COMPLETE 2+ VIEW  Comparison: None.  Findings: Pelvic rings appear intact.  Right hip hemiarthroplasty. Atherosclerosis.  No displaced pelvic fracture.  Left hip  chondrocalcinosis.  No left hip fracture.  No significant joint space narrowing.  IMPRESSION: No acute osseous abnormality.  Chondrocalcinosis of the left hip, which may be associated with CPPD arthropathy or senile.   Original Report Authenticated By: Andreas Newport, M.D.   Ct Lumbar Spine Wo Contrast  04/10/2013   *RADIOLOGY REPORT*  Clinical Data: Confusion with shortness of breath and fever following L3 decompression 03/26/2013.  Question osteomyelitis.  CT LUMBAR SPINE WITHOUT CONTRAST  Technique:  Multidetector CT imaging of the lumbar spine was performed without intravenous contrast administration. Multiplanar CT image reconstructions were also generated.  Comparison: Intraoperative radiographs 03/26/2013, lumbar myelogram CT 02/28/2013 and CT urogram  05/19/2008.  Findings: There are interval postsurgical changes at L3-L4 status post laminectomy and discectomy.  The spinal canal appears adequately decompressed posteriorly.  There is persistent annular disc bulging eccentric to the left with associated vacuum phenomenon.  There is persistent left foraminal stenosis and possible exiting L3 nerve root encroachment. Lesser foraminal narrowing is present on the right.  In addition, there is new inferior extension of high density and air within the anterior epidural space asymmetric to the left. This exerts mild mass effect on the thecal sac which is displaced posteriorly.  No anterior paraspinal inflammatory changes or fluid collections are identified.  There is no endplate destruction to suggest osteomyelitis.  Postsurgical changes are noted status post right renal transplant. 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  The native right kidney demonstrates moderate hydronephrosis, new from 2009 and suboptimally visualized on more recent studies. A calcified gallstone is again noted.  There is stable mild disc bulging at L1-L2 and L2-L3.  No spinal stenosis or nerve root  encroachment results.  L3-L4: As above.  L4-L5:  Similar annular disc bulging with a small left paracentral disc protrusion and bilateral facet hypertrophy.  There is mild resulting left lateral recess stenosis.  No foraminal compromise or exiting L4 nerve root encroachment is identified.  L5-S1:  Stable annular disc bulging, central disc protrusion and associated vacuum phenomenon.  No sacral nerve root displacement or significant foraminal compromise is identified.  There is stable mild bilateral facet hypertrophy.  IMPRESSION:  1.  Interval postsurgical changes at L3-L4. An epidural air fluid collection extends inferiorly from the disc space behind the L4 vertebral body and exerts mass effect on the thecal sac.  As this appears contiguous with the L3-L4 disc which demonstrates vacuum phenomenon, this may reflect an extruded disc fragment.  I cannot exclude an epidural abscess, although no generalized inflammatory changes are identified. There is persistent foraminal narrowing at L3-L4, worse on the left. 2.  No CT evidence of osteomyelitis. 3.  Stable disc protrusions at L4-L5 and L5-S1. 4.  Apparent hydronephrosis of the native right renal collecting system of uncertain significance and etiology.   Original Report Authenticated By: Carey Bullocks, M.D.   Ct Lumbar Spine W Contrast  04/17/2013   *RADIOLOGY REPORT*  Clinical Data: Left leg pain post surgery.  LUMBAR MYELOGRAM AND POST MYELOGRAM CT  MYELOGRAM  LUMBAR  Technique: Order reviewed. As the patient has acute on top of chronic renal insufficiency, the need for the case was discussed with Dr. Gerlene Fee.  Dr. Randell Loop who is following the patient (nephrology service) agreed to low volume intrathecal contrast. Any available prior imaging reviewed.  The procedure and associated risks were reviewed with the patient (including but not limited to headache, bleeding, infection, contrast reaction, nondiagnostic study, nerve damage, all of which may require  treatment).  All questions were answered.  Written and oral witnessed consent was obtained.  Time out performed.  Under sterile technique, a  lumbar puncture was performed with a L2- 3 pass of a 22g needle.  Clear CSF was noted. 8cc of Omnipaque 180 was instilled.   Lumbar myelogram fluoroscopic images were obtained.  Post procedure instructions were reviewed with the patient.  Fluoroscopy time:  48 seconds  Comparison: 04/11/2011 CT and preoperative post myelogram 03/16/2011 CT.  Findings: Dextroscoliosis lumbar spine.  L3-4 disc space narrowing greater on the left.  Impression upon the left lateral aspect of the thecal sac at the L3-4 level compressing  the left L4 nerve root.  IMPRESSION: Dextroscoliosis lumbar spine.  L3-4 disc space narrowing greater on the left.  Impression upon the left lateral aspect of the thecal sac at the L3- 4 level compressing the left L4 nerve root.  Please see below for further detail.  CT MYELOGRAPHY LUMBAR SPINE  Technique: CT imaging of the lumbar spine was performed after intrathecal contrast administration.  Multiplanar CT image reconstructions were also generated.  Findings:  Last fully open disc space is labeled L5-S1.  Present examination incorporates from T12-L1 through the mid sacrum.  Atrophic kidneys more notable on the left.  Arising from the lateral aspect of the left kidney is a 3.2 cm mass which has fatty components.  On the 2009 CT, this measured 5.4 cm (appearing more fatty on the remote exam) and possibly represents treated mass or involuting angiomyolipoma. Clinical correlation recommended.  This is incompletely assessed on present exam.  Hydronephrotic right kidney.  Prominent atherosclerotic type changes of the aorta.  Small bulge L3 level measures 2.4 cm whereas just above this level aorta measures 2.1 cm.  Narrowing of the renal arteries and less so involving the iliac arteries, superior mesenteric and celiac artery.  Conus L1 level.  Dextroscoliosis lumbar  spine with disc space narrowing most notable on the left at the L3-4 level.  T12-L1:  Negative.  L1-2:  Negative.  L2-3:  Minimal facet joint degenerative changes.  L3-4:  Prominent disc degeneration with disc space narrowing more notable on the left.  Recent laminectomy/partial facetectomy. Facet joint degenerative changes.  Broad-based calcified disc protrusion extending into the neural foramen with prominent bilateral foraminal narrowing and mass effect upon the exiting L3 nerve roots greater on the left. Postsurgical changes surrounding narrowed thecal sac further contributing to moderate to marked thecal sac narrowing.  Additionally, extending caudally from the left posterior lateral aspect of the disc space is a large soft tissue and air containing collection which may represent extruded disc material reaching the L4-5 disc space and causing mass effect upon the thecal sac and left L4 nerve roots.  Epidural abscess not entirely excluded in the proper clinical setting although a secondary consideration.  This has increased in size since the prior postoperative examination.  L4-5:  Facet joint degenerative changes.  Prior left hemilaminectomy.  Bulge/broad-based protrusion.  Facet joint degenerative changes.  Ligamentum flavum hypertrophy.  Mild to moderate left-sided and mild right-sided lateral recess stenosis. Mild spinal stenosis.  Mild bilateral foraminal narrowing.  L5-S1:  Facet joint degenerative changes.  Disc degeneration with broad-based slightly caudally extending disc protrusion which touches but does not cause significant mass effect upon the S1 nerve roots or thecal sac.  Lateral extension greater to the right with mild encroaching upon the exiting right L5 nerve root.  IMPRESSION: L3-4 recent laminectomy/partial facetectomy. Broad-based calcified disc protrusion extending into the neural foramen with prominent bilateral foraminal narrowing and mass effect upon the exiting L3 nerve roots greater  on the left. Postsurgical changes surrounding narrowed thecal sac further contributing to moderate to marked thecal sac narrowing.  Additionally, extending caudally from the left posterior lateral aspect of the disc space is a large soft tissue and air containing collection which may represent extruded disc material reaching the L4-5 disc space and causing mass effect upon the thecal sac and left L4 nerve roots.  Epidural abscess not entirely excluded in the proper clinical setting although a secondary consideration.  This has increased in size since the prior postoperative examination.  L4-5 multifactorial  mild to moderate left-sided and mild right- sided lateral recess stenosis.  Mild spinal stenosis.  Mild bilateral foraminal narrowing.  L5-S broad-based slightly caudally extending disc protrusion which touches but does not cause significant mass effect upon the S1 nerve roots or thecal sac.  Lateral extension greater to the right with mild encroaching upon the exiting right L5 nerve root.  Atrophic kidneys more notable on the left.  Arising from the lateral aspect of the left kidney is a 3.2 cm mass which has fatty components.  On the 2009 CT, this measured 5.4 cm (appearing more fatty on the remote exam) and possibly represents treated mass or involuting angiomyolipoma. Clinical correlation recommended.  This is incompletely assessed on present exam.  Hydronephrotic right kidney.  Prominent atherosclerotic type changes of the aorta.  Small bulge L3 level measures 2.4 cm whereas just above this level aorta measures 2.1 cm.  Narrowing of the renal arteries and less so involving the iliac arteries, superior mesenteric and celiac artery.  This has been made a PRA call report utilizing dashboard call feature.   Original Report Authenticated By: Lacy Duverney, M.D.   Ct Knee Right Wo Contrast  04/10/2013   *RADIOLOGY REPORT*  Clinical Data: Knee swelling and fever.  Question osteomyelitis.  CT OF THE RIGHT KNEE  WITHOUT CONTRAST  Technique:  Multidetector CT imaging was performed according to the standard protocol. Multiplanar CT image reconstructions were also generated.  Comparison: None.  Findings: No bony destructive change or focal lesion is identified. Mild chondrocalcinosis is noted.  The patient has only a small joint effusion.  There is diffuse subcutaneous edema about the knee without focal subcutaneous fluid collection.  Mild degenerative change is seen in the medial compartment.  Atherosclerotic vascular disease is noted.  IMPRESSION:  1.  No CT evidence of osteomyelitis.  Please note that CT scan is not sensitive for the detection of osteomyelitis. 2.  Small joint effusion.  No focal fluid collection is present. 3.  Subcutaneous edema diffusely about the knee is likely due to dependent change or possibly cellulitis. 4.  Atherosclerosis.   Original Report Authenticated By: Holley Dexter, M.D.   Dg Myelogram Lumbar  04/17/2013   *RADIOLOGY REPORT*  Clinical Data: Left leg pain post surgery.  LUMBAR MYELOGRAM AND POST MYELOGRAM CT  MYELOGRAM  LUMBAR  Technique: Order reviewed. As the patient has acute on top of chronic renal insufficiency, the need for the case was discussed with Dr. Gerlene Fee.  Dr. Randell Loop who is following the patient (nephrology service) agreed to low volume intrathecal contrast. Any available prior imaging reviewed.  The procedure and associated risks were reviewed with the patient (including but not limited to headache, bleeding, infection, contrast reaction, nondiagnostic study, nerve damage, all of which may require treatment).  All questions were answered.  Written and oral witnessed consent was obtained.  Time out performed.  Under sterile technique, a  lumbar puncture was performed with a L2- 3 pass of a 22g needle.  Clear CSF was noted. 8cc of Omnipaque 180 was instilled.   Lumbar myelogram fluoroscopic images were obtained.  Post procedure instructions were reviewed with the  patient.  Fluoroscopy time:  48 seconds  Comparison: 04/11/2011 CT and preoperative post myelogram 03/16/2011 CT.  Findings: Dextroscoliosis lumbar spine.  L3-4 disc space narrowing greater on the left.  Impression upon the left lateral aspect of the thecal sac at the L3-4 level compressing the left L4 nerve root.  IMPRESSION: Dextroscoliosis lumbar spine.  L3-4 disc space narrowing  greater on the left.  Impression upon the left lateral aspect of the thecal sac at the L3- 4 level compressing the left L4 nerve root.  Please see below for further detail.  CT MYELOGRAPHY LUMBAR SPINE  Technique: CT imaging of the lumbar spine was performed after intrathecal contrast administration.  Multiplanar CT image reconstructions were also generated.  Findings:  Last fully open disc space is labeled L5-S1.  Present examination incorporates from T12-L1 through the mid sacrum.  Atrophic kidneys more notable on the left.  Arising from the lateral aspect of the left kidney is a 3.2 cm mass which has fatty components.  On the 2009 CT, this measured 5.4 cm (appearing more fatty on the remote exam) and possibly represents treated mass or involuting angiomyolipoma. Clinical correlation recommended.  This is incompletely assessed on present exam.  Hydronephrotic right kidney.  Prominent atherosclerotic type changes of the aorta.  Small bulge L3 level measures 2.4 cm whereas just above this level aorta measures 2.1 cm.  Narrowing of the renal arteries and less so involving the iliac arteries, superior mesenteric and celiac artery.  Conus L1 level.  Dextroscoliosis lumbar spine with disc space narrowing most notable on the left at the L3-4 level.  T12-L1:  Negative.  L1-2:  Negative.  L2-3:  Minimal facet joint degenerative changes.  L3-4:  Prominent disc degeneration with disc space narrowing more notable on the left.  Recent laminectomy/partial facetectomy. Facet joint degenerative changes.  Broad-based calcified disc protrusion extending  into the neural foramen with prominent bilateral foraminal narrowing and mass effect upon the exiting L3 nerve roots greater on the left. Postsurgical changes surrounding narrowed thecal sac further contributing to moderate to marked thecal sac narrowing.  Additionally, extending caudally from the left posterior lateral aspect of the disc space is a large soft tissue and air containing collection which may represent extruded disc material reaching the L4-5 disc space and causing mass effect upon the thecal sac and left L4 nerve roots.  Epidural abscess not entirely excluded in the proper clinical setting although a secondary consideration.  This has increased in size since the prior postoperative examination.  L4-5:  Facet joint degenerative changes.  Prior left hemilaminectomy.  Bulge/broad-based protrusion.  Facet joint degenerative changes.  Ligamentum flavum hypertrophy.  Mild to moderate left-sided and mild right-sided lateral recess stenosis. Mild spinal stenosis.  Mild bilateral foraminal narrowing.  L5-S1:  Facet joint degenerative changes.  Disc degeneration with broad-based slightly caudally extending disc protrusion which touches but does not cause significant mass effect upon the S1 nerve roots or thecal sac.  Lateral extension greater to the right with mild encroaching upon the exiting right L5 nerve root.  IMPRESSION: L3-4 recent laminectomy/partial facetectomy. Broad-based calcified disc protrusion extending into the neural foramen with prominent bilateral foraminal narrowing and mass effect upon the exiting L3 nerve roots greater on the left. Postsurgical changes surrounding narrowed thecal sac further contributing to moderate to marked thecal sac narrowing.  Additionally, extending caudally from the left posterior lateral aspect of the disc space is a large soft tissue and air containing collection which may represent extruded disc material reaching the L4-5 disc space and causing mass effect upon  the thecal sac and left L4 nerve roots.  Epidural abscess not entirely excluded in the proper clinical setting although a secondary consideration.  This has increased in size since the prior postoperative examination.  L4-5 multifactorial mild to moderate left-sided and mild right- sided lateral recess stenosis.  Mild spinal stenosis.  Mild bilateral foraminal narrowing.  L5-S broad-based slightly caudally extending disc protrusion which touches but does not cause significant mass effect upon the S1 nerve roots or thecal sac.  Lateral extension greater to the right with mild encroaching upon the exiting right L5 nerve root.  Atrophic kidneys more notable on the left.  Arising from the lateral aspect of the left kidney is a 3.2 cm mass which has fatty components.  On the 2009 CT, this measured 5.4 cm (appearing more fatty on the remote exam) and possibly represents treated mass or involuting angiomyolipoma. Clinical correlation recommended.  This is incompletely assessed on present exam.  Hydronephrotic right kidney.  Prominent atherosclerotic type changes of the aorta.  Small bulge L3 level measures 2.4 cm whereas just above this level aorta measures 2.1 cm.  Narrowing of the renal arteries and less so involving the iliac arteries, superior mesenteric and celiac artery.  This has been made a PRA call report utilizing dashboard call feature.   Original Report Authenticated By: Lacy Duverney, M.D.   US Renal  04/12/2013   *RADIOLOGY REPORT*  Clinical Data: Kidney transplant with elevated creatinine. Evaluate for hydronephrosis. History of fluid overload.  RENAL/URINARY TRACT ULTRASOUND COMPLETE  Comparison:  Lumbar spine CT 04/10/2013 and abdominal CT 05/19/2008  Findings:  Right Kidney:  The native right kidney is small and echogenic.  The right kidney measures 7.7 cm. Difficult to exclude hydronephrosis in the native right kidney.  Cyst in the right kidney measures up to 2.5 cm.  Left Kidney:  Atrophic and  echogenic.  Left kidney roughly measures 7.5 cm in length.  Lower pole is difficult to evaluate.  Transplant kidney: The transplant kidney measures 12.4 cm in length.  There are hypoechoic cysts associated with the transplant kidney and a small amount of adjacent ascites.  Largest cyst roughly measures 2.7 cm.  Bladder:  Fluid filled and unremarkable.  Other findings:  There are bilateral pleural effusions. Evidence for an echogenic stone with posterior acoustic shadowing at the base of the gallbladder.  IMPRESSION: No significant hydronephrosis involving the transplant kidney. There appears to be cortical cysts involving the transplant kidney as described.  Bilateral native kidneys are atrophic and echogenic as expected. Evidence for bilateral renal cortical cysts. Difficult to exclude hydronephrosis of the native right kidney.  Bilateral pleural effusions and ascites.  Cholelithiasis.   Original Report Authenticated By: Richarda Overlie, M.D.   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  Perfusion images were obtained in multiple projections after intravenous injection of Tc-20m MAA.  Radiopharmaceuticals:  Tc-44m DTPA aerosol and 6 mCi Tc-46m MAA.  Comparison: None Correlation:  Chest radiograph 04/09/2013  Findings:  Ventilation:  Absent ventilation in left lower lobe.  Enlargement of cardiac silhouette.  Mildly diminished ventilation in lung apices.  Perfusion:  Cardiomegaly.  Diminished perfusion throughout left lower lobe suspect related to the enlargement of cardiac silhouette, less severe than accompanying ventilatory loss. Minimal diminished perfusion in the left upper lobe matching ventilatory  findings. No additional segmental or subsegmental perfusion defects.  IMPRESSION: Very low probability for pulmonary embolism.   Original Report Authenticated By: [name], M.D.   Dg Chest Port 1 View  04/11/2013   *RADIOLOGY REPORT*  Clinical Data: Shortness of breath, evaluate for volume overload  PORTABLE CHEST - 1 VIEW  Comparison: 04/09/2013; 07/27/2012  Findings: Grossly unchanged marked enlargement of the cardiac silhouette and mediastinal contours.  Stable positioning of support apparatus.  Chronic mild pulmonary venous congestion without frank evidence of edema.  Grossly unchanged bilateral medial basilar heterogeneous opacities.  No new focal airspace opacities.  No definite pleural effusion or pneumothorax.  Unchanged bones.  IMPRESSION:  Chronic findings of cardiomegaly and pulmonary venous congestion without definite evidence of acute superimposed worsening pulmonary edema.   Original Report Authenticated By: Tacey Ruiz, MD   Dg Chest Port 1 View  04/09/2013   *RADIOLOGY REPORT*  Clinical Data: Hypoglycemia and shortness of breath  PORTABLE CHEST - 1 VIEW  Comparison: Chest radiograph 03/23/2013  Findings: Stable enlarged cardiac silhouette.  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  There are low lung volumes and central venous congestion.  No pneumothorax.  Lung bases poorly evaluated  IMPRESSION: Decreased lung volumes and central venous congestion. Cardiomegaly.   Original Report Authenticated By: Genevive Bi, M.D.    Medications: Scheduled Meds: . antiseptic oral rinse  15 mL Mouth Rinse BID  . aspirin EC  325 mg Oral Daily  . atorvastatin  40 mg Oral q1800  . carvedilol  12.5 mg Oral BID WC  . darbepoetin (ARANESP) injection - NON-DIALYSIS  100 mcg Subcutaneous Q Thu-1800  . docusate sodium  100 mg Oral BID  . enoxaparin (LOVENOX) injection  30 mg Subcutaneous Q24H  . fentaNYL  50 mcg Intravenous Once  . furosemide  80 mg Oral Daily  . hydrocerin   Topical BID  . insulin  aspart  0-5 Units Subcutaneous QHS  . insulin aspart  0-9 Units Subcutaneous TID WC  . insulin detemir  10 Units Subcutaneous Daily  . metoCLOPramide  5 mg Oral TID AC  . mycophenolate  500 mg Oral BID  . omega-3 acid ethyl esters  1 g Oral Daily  . pantoprazole  40 mg Oral Q0600  . predniSONE  5 mg Oral BID  . pyridOXINE  200 mg Oral Daily  . sodium chloride  3 mL Intravenous Q12H  . vitamin B-12  250 mcg Oral Daily  . vitamin C  500 mg Oral Daily      LOS: 13 days   Menachem Urbanek M.D. [facility] 04/22/2013, 10:34 AM Pager: [phone]  If 7PM-7AM, please contact night-coverage [email or web address] Password [email or web address]

## 2013-04-22 NOTE — Progress Notes (Signed)
Patient ID: [name], male   DOB: 02-Aug-1946, 67 y.o.   MRN: [id number] BP 141/71  Pulse 78  Temp(Src) 98 F (36.7 C) (Oral)  Resp 18  Ht 6\' 1"  (1.854 m)  Wt 76.6 kg (168 lb 14 oz)  BMI 22.28 kg/m2  SpO2 100% Alert and oriented x 4 Moving lower extremities well Wound is clean, dry, and without signs of infection Awaiting placement doing well overall

## 2013-04-23 ENCOUNTER — Inpatient Hospital Stay (HOSPITAL_COMMUNITY)
Admission: RE | Admit: 2013-04-23 | Discharge: 2013-05-04 | DRG: 945 | Disposition: A | Discharge status: Home / Self Care | Payer: PRIVATE HEALTH INSURANCE | Source: Intra-hospital | Attending: Physical Medicine & Rehabilitation | Admitting: Physical Medicine & Rehabilitation

## 2013-04-23 ENCOUNTER — Inpatient Hospital Stay (HOSPITAL_COMMUNITY): Payer: PRIVATE HEALTH INSURANCE

## 2013-04-23 DIAGNOSIS — I5040 Unspecified combined systolic (congestive) and diastolic (congestive) heart failure: Secondary | ICD-10-CM | POA: Diagnosis present

## 2013-04-23 DIAGNOSIS — M5126 Other intervertebral disc displacement, lumbar region: Secondary | ICD-10-CM | POA: Diagnosis present

## 2013-04-23 DIAGNOSIS — I319 Disease of pericardium, unspecified: Secondary | ICD-10-CM | POA: Diagnosis present

## 2013-04-23 DIAGNOSIS — Z950 Presence of cardiac pacemaker: Secondary | ICD-10-CM

## 2013-04-23 DIAGNOSIS — D638 Anemia in other chronic diseases classified elsewhere: Secondary | ICD-10-CM | POA: Diagnosis present

## 2013-04-23 DIAGNOSIS — E1142 Type 2 diabetes mellitus with diabetic polyneuropathy: Secondary | ICD-10-CM | POA: Diagnosis present

## 2013-04-23 DIAGNOSIS — A419 Sepsis, unspecified organism: Secondary | ICD-10-CM | POA: Diagnosis present

## 2013-04-23 DIAGNOSIS — Z9889 Other specified postprocedural states: Secondary | ICD-10-CM

## 2013-04-23 DIAGNOSIS — E119 Type 2 diabetes mellitus without complications: Secondary | ICD-10-CM | POA: Diagnosis present

## 2013-04-23 DIAGNOSIS — Z5189 Encounter for other specified aftercare: Principal | ICD-10-CM

## 2013-04-23 DIAGNOSIS — K5900 Constipation, unspecified: Secondary | ICD-10-CM | POA: Diagnosis present

## 2013-04-23 DIAGNOSIS — I509 Heart failure, unspecified: Secondary | ICD-10-CM | POA: Diagnosis present

## 2013-04-23 DIAGNOSIS — N179 Acute kidney failure, unspecified: Secondary | ICD-10-CM | POA: Diagnosis present

## 2013-04-23 DIAGNOSIS — Z940 Kidney transplant status: Secondary | ICD-10-CM

## 2013-04-23 DIAGNOSIS — R5381 Other malaise: Secondary | ICD-10-CM

## 2013-04-23 DIAGNOSIS — I129 Hypertensive chronic kidney disease with stage 1 through stage 4 chronic kidney disease, or unspecified chronic kidney disease: Secondary | ICD-10-CM | POA: Diagnosis present

## 2013-04-23 DIAGNOSIS — N189 Chronic kidney disease, unspecified: Secondary | ICD-10-CM | POA: Diagnosis present

## 2013-04-23 DIAGNOSIS — E1149 Type 2 diabetes mellitus with other diabetic neurological complication: Secondary | ICD-10-CM | POA: Diagnosis present

## 2013-04-23 DIAGNOSIS — I10 Essential (primary) hypertension: Secondary | ICD-10-CM | POA: Diagnosis present

## 2013-04-23 DIAGNOSIS — L02419 Cutaneous abscess of limb, unspecified: Secondary | ICD-10-CM | POA: Diagnosis present

## 2013-04-23 DIAGNOSIS — R110 Nausea: Secondary | ICD-10-CM | POA: Diagnosis present

## 2013-04-23 LAB — GLUCOSE, CAPILLARY
Glucose-Capillary: 106 mg/dL — ABNORMAL HIGH (ref 70–99)
Glucose-Capillary: 205 mg/dL — ABNORMAL HIGH (ref 70–99)

## 2013-04-23 LAB — RENAL FUNCTION PANEL
Albumin: 2.6 g/dL — ABNORMAL LOW (ref 3.5–5.2)
BUN: 39 mg/dL — ABNORMAL HIGH (ref 6–23)
Calcium: 8.6 mg/dL (ref 8.4–10.5)
Creatinine, Ser: 2.54 mg/dL — ABNORMAL HIGH (ref 0.50–1.35)
GFR calc non Af Amer: 25 mL/min — ABNORMAL LOW (ref >90)
Phosphorus: 4.5 mg/dL (ref 2.3–4.6)

## 2013-04-23 MED ORDER — PROCHLORPERAZINE EDISYLATE 5 MG/ML IJ SOLN
5.0000 mg | Freq: Four times a day (QID) | INTRAMUSCULAR | Status: DC | PRN
  Filled 2013-04-23: qty 2

## 2013-04-23 MED ORDER — ALPRAZOLAM 0.25 MG PO TABS
0.2500 mg | ORAL_TABLET | Freq: Every evening | ORAL | Status: DC | PRN
  Administered 2013-04-23 – 2013-04-27 (×5): 0.25 mg via ORAL
  Filled 2013-04-23 (×7): qty 1

## 2013-04-23 MED ORDER — ATORVASTATIN CALCIUM 40 MG PO TABS
40.0000 mg | ORAL_TABLET | Freq: Every day | ORAL | Status: DC
  Administered 2013-04-23 – 2013-05-03 (×11): 40 mg via ORAL
  Filled 2013-04-23 (×14): qty 1

## 2013-04-23 MED ORDER — VITAMIN C 500 MG PO TABS
500.0000 mg | ORAL_TABLET | Freq: Every day | ORAL | Status: DC
  Administered 2013-04-24 – 2013-05-04 (×11): 500 mg via ORAL
  Filled 2013-04-23 (×12): qty 1

## 2013-04-23 MED ORDER — OMEGA-3-ACID ETHYL ESTERS 1 G PO CAPS
1.0000 g | ORAL_CAPSULE | Freq: Every day | ORAL | Status: DC
  Administered 2013-04-24 – 2013-05-04 (×11): 1 g via ORAL
  Filled 2013-04-23 (×12): qty 1

## 2013-04-23 MED ORDER — PREDNISONE 5 MG PO TABS
5.0000 mg | ORAL_TABLET | Freq: Two times a day (BID) | ORAL | Status: DC
  Administered 2013-04-23 – 2013-05-04 (×22): 5 mg via ORAL
  Filled 2013-04-23 (×25): qty 1

## 2013-04-23 MED ORDER — HYDROCERIN EX CREA
TOPICAL_CREAM | Freq: Two times a day (BID) | CUTANEOUS | Status: DC
  Administered 2013-04-23 – 2013-05-04 (×22): via TOPICAL
  Filled 2013-04-23 (×6): qty 113

## 2013-04-23 MED ORDER — MYCOPHENOLATE MOFETIL 250 MG PO CAPS
500.0000 mg | ORAL_CAPSULE | Freq: Two times a day (BID) | ORAL | Status: DC
  Administered 2013-04-23 – 2013-05-04 (×22): 500 mg via ORAL
  Filled 2013-04-23 (×25): qty 2

## 2013-04-23 MED ORDER — DIPHENHYDRAMINE HCL 12.5 MG/5ML PO ELIX: 12.5000 mg | ORAL_SOLUTION | Freq: Four times a day (QID) | ORAL | Status: DC | PRN

## 2013-04-23 MED ORDER — METOCLOPRAMIDE HCL 5 MG PO TABS
5.0000 mg | ORAL_TABLET | Freq: Two times a day (BID) | ORAL | Status: AC
  Administered 2013-04-24: 5 mg via ORAL
  Filled 2013-04-23 (×2): qty 1

## 2013-04-23 MED ORDER — INSULIN ASPART 100 UNIT/ML ~~LOC~~ SOLN
0.0000 [IU] | Freq: Three times a day (TID) | SUBCUTANEOUS | Status: DC
  Administered 2013-04-23: 2 [IU] via SUBCUTANEOUS
  Administered 2013-04-24: 1 [IU] via SUBCUTANEOUS
  Administered 2013-04-24: 2 [IU] via SUBCUTANEOUS
  Administered 2013-04-25 – 2013-04-27 (×5): 1 [IU] via SUBCUTANEOUS
  Administered 2013-04-27: 2 [IU] via SUBCUTANEOUS
  Administered 2013-04-28 (×2): 1 [IU] via SUBCUTANEOUS
  Administered 2013-04-28 – 2013-04-29 (×3): 2 [IU] via SUBCUTANEOUS
  Administered 2013-04-29: 3 [IU] via SUBCUTANEOUS
  Administered 2013-04-30: 2 [IU] via SUBCUTANEOUS
  Administered 2013-04-30: 1 [IU] via SUBCUTANEOUS
  Administered 2013-04-30: 2 [IU] via SUBCUTANEOUS
  Administered 2013-05-01: 1 [IU] via SUBCUTANEOUS
  Administered 2013-05-01: 2 [IU] via SUBCUTANEOUS
  Administered 2013-05-01: 1 [IU] via SUBCUTANEOUS
  Administered 2013-05-02: 2 [IU] via SUBCUTANEOUS
  Administered 2013-05-02 – 2013-05-04 (×4): 1 [IU] via SUBCUTANEOUS

## 2013-04-23 MED ORDER — ONDANSETRON HCL 4 MG/2ML IJ SOLN: 4.0000 mg | INTRAMUSCULAR | Status: DC | PRN

## 2013-04-23 MED ORDER — POLYETHYLENE GLYCOL 3350 17 G PO PACK
17.0000 g | PACK | Freq: Every day | ORAL | Status: DC | PRN
  Filled 2013-04-23: qty 1

## 2013-04-23 MED ORDER — DARBEPOETIN ALFA-POLYSORBATE 100 MCG/0.5ML IJ SOLN
100.0000 ug | INTRAMUSCULAR | Status: DC
  Administered 2013-04-26 – 2013-05-03 (×2): 100 ug via SUBCUTANEOUS
  Filled 2013-04-23 (×4): qty 0.5

## 2013-04-23 MED ORDER — SODIUM CHLORIDE 0.9 % IJ SOLN
3.0000 mL | Freq: Two times a day (BID) | INTRAMUSCULAR | Status: DC
  Administered 2013-04-24: 3 mL via INTRAVENOUS

## 2013-04-23 MED ORDER — ALBUTEROL SULFATE (5 MG/ML) 0.5% IN NEBU: 2.5000 mg | INHALATION_SOLUTION | RESPIRATORY_TRACT | Status: DC | PRN

## 2013-04-23 MED ORDER — MENTHOL 3 MG MT LOZG
1.0000 | LOZENGE | OROMUCOSAL | Status: DC | PRN
  Filled 2013-04-23: qty 9

## 2013-04-23 MED ORDER — ENOXAPARIN SODIUM 30 MG/0.3ML ~~LOC~~ SOLN
30.0000 mg | SUBCUTANEOUS | Status: DC
  Administered 2013-04-23 – 2013-05-03 (×11): 30 mg via SUBCUTANEOUS
  Filled 2013-04-23 (×13): qty 0.3

## 2013-04-23 MED ORDER — FUROSEMIDE 80 MG PO TABS
80.0000 mg | ORAL_TABLET | Freq: Every day | ORAL | Status: DC
  Administered 2013-04-24 – 2013-05-04 (×11): 80 mg via ORAL
  Filled 2013-04-23 (×13): qty 1

## 2013-04-23 MED ORDER — METHOCARBAMOL 500 MG PO TABS
500.0000 mg | ORAL_TABLET | Freq: Four times a day (QID) | ORAL | Status: DC | PRN
  Filled 2013-04-23: qty 1

## 2013-04-23 MED ORDER — PROCHLORPERAZINE 25 MG RE SUPP
12.5000 mg | Freq: Four times a day (QID) | RECTAL | Status: DC | PRN
  Filled 2013-04-23: qty 1

## 2013-04-23 MED ORDER — ASPIRIN EC 325 MG PO TBEC
325.0000 mg | DELAYED_RELEASE_TABLET | Freq: Every day | ORAL | Status: DC
  Administered 2013-04-24 – 2013-05-04 (×11): 325 mg via ORAL
  Filled 2013-04-23 (×12): qty 1

## 2013-04-23 MED ORDER — PROCHLORPERAZINE MALEATE 5 MG PO TABS
5.0000 mg | ORAL_TABLET | Freq: Four times a day (QID) | ORAL | Status: DC | PRN
  Filled 2013-04-23: qty 2

## 2013-04-23 MED ORDER — CARVEDILOL 12.5 MG PO TABS
12.5000 mg | ORAL_TABLET | Freq: Two times a day (BID) | ORAL | Status: DC
  Administered 2013-04-23 – 2013-05-04 (×22): 12.5 mg via ORAL
  Filled 2013-04-23 (×25): qty 1

## 2013-04-23 MED ORDER — INSULIN ASPART 100 UNIT/ML ~~LOC~~ SOLN
0.0000 [IU] | Freq: Every day | SUBCUTANEOUS | Status: DC
  Administered 2013-04-23 – 2013-05-03 (×3): 2 [IU] via SUBCUTANEOUS

## 2013-04-23 MED ORDER — BISACODYL 10 MG RE SUPP
10.0000 mg | Freq: Every day | RECTAL | Status: DC | PRN
  Administered 2013-04-28: 10 mg via RECTAL
  Filled 2013-04-23: qty 1

## 2013-04-23 MED ORDER — BIOTENE DRY MOUTH MT LIQD
15.0000 mL | Freq: Two times a day (BID) | OROMUCOSAL | Status: DC
  Administered 2013-04-23 – 2013-05-03 (×20): 15 mL via OROMUCOSAL
  Filled 2013-04-23: qty 15

## 2013-04-23 MED ORDER — GUAIFENESIN-DM 100-10 MG/5ML PO SYRP: 5.0000 mL | ORAL_SOLUTION | Freq: Four times a day (QID) | ORAL | Status: DC | PRN

## 2013-04-23 MED ORDER — ALUM & MAG HYDROXIDE-SIMETH 200-200-20 MG/5ML PO SUSP: 30.0000 mL | Freq: Four times a day (QID) | ORAL | Status: DC | PRN

## 2013-04-23 MED ORDER — INSULIN DETEMIR 100 UNIT/ML ~~LOC~~ SOLN
10.0000 [IU] | Freq: Every day | SUBCUTANEOUS | Status: DC
  Administered 2013-04-24 – 2013-05-04 (×11): 10 [IU] via SUBCUTANEOUS
  Filled 2013-04-23 (×14): qty 0.1

## 2013-04-23 MED ORDER — NITROGLYCERIN 0.4 MG SL SUBL: 0.4000 mg | SUBLINGUAL_TABLET | SUBLINGUAL | Status: DC | PRN

## 2013-04-23 MED ORDER — VITAMIN B-6 100 MG PO TABS
200.0000 mg | ORAL_TABLET | Freq: Every day | ORAL | Status: DC
  Administered 2013-04-24 – 2013-05-04 (×11): 200 mg via ORAL
  Filled 2013-04-23 (×12): qty 2

## 2013-04-23 MED ORDER — CYANOCOBALAMIN 250 MCG PO TABS
250.0000 ug | ORAL_TABLET | Freq: Every day | ORAL | Status: DC
  Administered 2013-04-24 – 2013-05-04 (×11): 250 ug via ORAL
  Filled 2013-04-23 (×12): qty 1

## 2013-04-23 MED ORDER — ACETAMINOPHEN 325 MG PO TABS: 325.0000 mg | ORAL_TABLET | ORAL | Status: DC | PRN

## 2013-04-23 MED ORDER — PHENOL 1.4 % MT LIQD
1.0000 | OROMUCOSAL | Status: DC | PRN
  Filled 2013-04-23: qty 177

## 2013-04-23 MED ORDER — FAMOTIDINE 20 MG PO TABS
20.0000 mg | ORAL_TABLET | Freq: Every day | ORAL | Status: DC
  Administered 2013-04-24 – 2013-05-04 (×11): 20 mg via ORAL
  Filled 2013-04-23 (×13): qty 1

## 2013-04-23 MED ORDER — TOBRAMYCIN 0.3 % OP SOLN
1.0000 [drp] | OPHTHALMIC | Status: DC | PRN
  Filled 2013-04-23: qty 5

## 2013-04-23 MED ORDER — OXYCODONE HCL 5 MG PO TABS
5.0000 mg | ORAL_TABLET | ORAL | Status: DC | PRN
  Administered 2013-04-23 – 2013-05-04 (×45): 10 mg via ORAL
  Filled 2013-04-23 (×46): qty 2

## 2013-04-23 NOTE — Progress Notes (Signed)
Discussed with PT and OT. Patient overall min /minguard assist level. I will discuss with [employer] insurance to request inpt rehab admission today. [phone]

## 2013-04-23 NOTE — Discharge Summary (Signed)
Physician Discharge Summary  Patient ID: [name] MRN: [id number] DOB/AGE: 02/15/1946 67 y.o.  Admit date: 04/09/2013 Discharge date: 04/23/2013  Primary Care Physician:  [name] Curia, MD  Discharge Diagnoses:    . SIRS (systemic inflammatory response syndrome)- resolved  . Diabetes mellitus type 2, controlled . Cellulitis of right lower extremity- improved  . CAD (coronary artery disease) . Renal failure (ARF), acute on chronic kidney disease stage 3 . Dehydration . Acute respiratory failure with hypoxia- resolved  . HTN (hypertension) . Pericardial effusion  s/p Redo left L3-4 Intervertebral discectomy with decompression of the left L4 nerve root; left L4 hemilaminectomy and discectomy to decompress the L5 nerve root    Consults:  Cardiology, Dr. Patty [name]                     Neurosurgery, Dr. Lovell [name]                     Nephrology     Recommendations for Outpatient Follow-up:  Patient will need followup in 2 weeks with Dr. Lovell [name] after discharge  Allergies:  No Known Allergies   Discharge Medications:   Medication List    STOP taking these medications       aspirin EC 81 MG tablet     insulin detemir 100 UNIT/ML injection  Commonly known as:  LEVEMIR      TAKE these medications       ALPRAZolam 0.25 MG tablet  Commonly known as:  XANAX  Take 0.25 mg by mouth at bedtime as needed for sleep or anxiety.     carvedilol 12.5 MG tablet  Commonly known as:  COREG  Take 12.5 mg by mouth 2 (two) times daily with a meal.     fish oil-omega-3 fatty acids 1000 MG capsule  Take 1 g by mouth daily.     furosemide 40 MG tablet  Commonly known as:  LASIX  Take 40 mg by mouth daily as needed (if weight is above 173 lbs).     insulin aspart 100 UNIT/ML injection  Commonly known as:  novoLOG  Inject 6 Units into the skin 3 (three) times daily before meals.     multivitamin with minerals Tabs  Take 1 tablet by mouth daily.     mycophenolate 500 MG tablet   Commonly known as:  CELLCEPT  Take 500 mg by mouth 2 (two) times daily.     nitroGLYCERIN 0.4 MG SL tablet  Commonly known as:  NITROSTAT  Place 0.4 mg under the tongue every 5 (five) minutes as needed for chest pain. x3 doses as needed for chest pain     oxyCODONE-acetaminophen 5-325 MG per tablet  Commonly known as:  PERCOCET/ROXICET  Take 1 tablet by mouth every 4 (four) hours as needed for pain. For pain     predniSONE 5 MG tablet  Commonly known as:  DELTASONE  Take 5 mg by mouth 2 (two) times daily.     pyridOXINE 100 MG tablet  Commonly known as:  VITAMIN B-6  Take 200 mg by mouth daily.     ranitidine 300 MG tablet  Commonly known as:  ZANTAC  Take 300 mg by mouth at bedtime.     simvastatin 80 MG tablet  Commonly known as:  ZOCOR  Take 80 mg by mouth at bedtime.     tobramycin 0.3 % ophthalmic solution  Commonly known as:  TOBREX  Place 1 drop into both eyes as needed (for allergies).  vitamin B-12 250 MCG tablet  Commonly known as:  CYANOCOBALAMIN  Take 250 mcg by mouth daily.     vitamin C 500 MG tablet  Commonly known as:  ASCORBIC ACID  Take 500 mg by mouth daily.       Current inpatient medications to be continued at the time of DC -  Aspirin 325 mg daily - Lasix 80 mg daily (patient is noted to be on Lasix 40 mg as needed at home) - Please note that patient was on Levemir 40 units subcutaneous daily and had presented with hypoglycemia at the time of admission. He is currently on Levemir 10 units subcutaneous daily, adjusted dose according to his blood sugar readings at the time of DC from inpatient rehabilitation  Brief H and P: For complete details please refer to admission H and P, but in brief, [name] is a 67 y.o. male with known history of renal transplant who has had a recent low back surgery 2 weeks ago for L3 decompression discectomy was brought to the ER but patient's wife noticed that patient was found to be mildly confused and mildly  short of breath. After patient reached ER patient was found to be febrile. Patient's leg particularly the right lower extremity looked erythematous knee below. Patient does have mild erythema on the left side too. Patient's blood sugar was below 50. Initially D50 was given D5 normal saline has been started. As per patient's wife patient was doing fine until yesterday after the surgery. Patient had some right knee pain denies any low back pain at the surgical site does not have any active discharge. Denied any chest pain nausea vomiting abdominal pain diarrhea. Chest x-ray showed congestion and patient's BNP levels was elevated with mildly elevated troponin. EKG shows paced rhythm with ST elevation in the inferior leads. Patient's creatinine is elevated from baseline.   Hospital Course:  67 year old male patient on chronic immunosuppression after renal transplant remotely. Had lumbar decompression surgery 2 weeks prior to admission. Patient's wife brought him to the ER because he was confused and short of breath. In the ER patient was febrile and he was noted to have right lower extremity cellulitis,  patient was also hypoglycemic with a blood sugar of 50.   Chest x-ray showed possible central vascular congestion, patient had mildly elevated troponin, with paced rhythm and BBB. He also appeared to be in acute renal failure. Cardiology was consulted and an echocardiogram was done, which showed large pericardial effusion without tamponade and in acute in chronic systolic CHF. Plan was to do pericardial window, nephrology recommended diuresis with IV lasix. The patient had a repeat echocardiogram on 04/17/2013 which showed unchanged pericardial effusion. Patient was placed on colchicine but had to be discontinued due to diarrhea. C. difficile and stool studies were all negative.  He was also being followed neuro surgery for his back surgery 2 weeks ago, pt underwent CT myelogram of the lumbar spine, showing  impression on the lateral aspect of the thecal sac at L3 L4.  He was cleared by cardiology to undergo repeat neurosurgery on 04/19/2013 by Dr. 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 Continue Pepcid, Zofran   SIRS (systemic inflammatory response  syndrome) - resolved  Cellulitis of right lower extremity- improving   Renal failure (ARF), acute on chronic kidney disease stage 3 , history of renal transplant  - Renal service following, on oral Lasix. Continue prednisone and CellCept   Demand ischemia/CAD (coronary artery disease) -no chest pain or acute dyspnea  -cont Coreg and ASA, statins   Systolic Heart Failure/EF 30-35%/Grade 2 Diastolic Heart Failure  -baseline EF 25% in March 2013 after PEA arrest requiring PPM/ICD  -cont coreg, aspirin 325 mg daily, statins  Large Pericardial Effusion - no evidence of tamponade  -started on Colchicine per Cards, but d/t diarrhea, it was stopped.  - Repeat echo showed unchanged or slightly smaller pericardial effusion   Positive D dimer  -VQ scan with VERY low probability for PE   Acute respiratory failure with hypoxia - resolved   PVD  - pt endorsed vascular studies 4 months ago at [place]   HTN (hypertension)  -BP controlled   Diabetes mellitus type 2, controlled/hypoglycemia  - Patient was on 40 units of Levemir at home and caused hypoglycemia, currently tolerating 10 units levemir with SSI.  History of atrial fib  -occurred March 2013- pt refused anti coagulation   Diarrhea: Resolved    Day of Discharge BP 159/82  Pulse 78  Temp(Src) 98.5 F (36.9 C) (Oral)  Resp 15  Ht 6\' 1"  (1.854 m)  Wt 74.6 kg (164 lb 7.4  oz)  BMI 21.7 kg/m2  SpO2 99%  Physical Exam: General: Alert and awake oriented x3 not in any acute distress. CVS: S1-S2 clear no murmur rubs or gallops Chest: clear to auscultation bilaterally, no wheezing rales or rhonchi Abdomen: soft nontender, nondistended, normal bowel sounds Extremities: no cyanosis, clubbing or edema noted bilaterally Neuro: Cranial nerves II-XII intact, no focal neurological deficits   The results of significant diagnostics from this hospitalization (including imaging, microbiology, ancillary and laboratory) are listed below for reference.    LAB RESULTS: Basic Metabolic Panel:  Recent Labs Lab 04/21/13 0510 04/23/13 0445  NA 136 137  K 4.3 3.5  CL 98 100  CO2 26 26  GLUCOSE 117* 135*  BUN 39* 39*  CREATININE 2.68* 2.54*  CALCIUM 8.8 8.6  PHOS  --  4.5   Liver Function Tests:  Recent Labs Lab 04/19/13 0400 04/23/13 0445  AST 15  --   ALT 23  --   ALKPHOS 88  --   BILITOT 0.3  --   PROT 5.2*  --   ALBUMIN 2.6* 2.6*   No results found for this basename: LIPASE, AMYLASE,  in the last 168 hours No results found for this basename: AMMONIA,  in the last 168 hours CBC:  Recent Labs Lab 04/19/13 0400 04/20/13 0545  WBC 7.7 9.3  HGB 9.5* 9.8*  HCT 30.4* 31.6*  MCV 94.4 95.2  PLT 166 162   Cardiac Enzymes: No results found for this basename: CKTOTAL, CKMB, CKMBINDEX, TROPONINI,  in the last 168 hours BNP: No components found with this basename: POCBNP,  CBG:  Recent Labs Lab 04/23/13 0712 04/23/13 1137  GLUCAP 122* 106*    Significant Diagnostic Studies:  Ct Lumbar Spine Wo Contrast  04/10/2013   *RADIOLOGY REPORT*  Clinical Data: Confusion with shortness of breath and fever following L3 decompression 03/26/2013.  Question osteomyelitis.  CT LUMBAR SPINE WITHOUT CONTRAST  Technique:  Multidetector CT imaging of the lumbar spine was performed without intravenous contrast administration. Multiplanar CT image reconstructions were  also generated.  Comparison: Intraoperative radiographs 03/26/2013,  lumbar myelogram CT 02/28/2013 and CT urogram 05/19/2008.  Findings: There are interval postsurgical changes at L3-L4 status post laminectomy and discectomy.  The spinal canal appears adequately decompressed posteriorly.  There is persistent annular disc bulging eccentric to the left with associated vacuum phenomenon.  There is persistent left foraminal stenosis and possible exiting L3 nerve root encroachment. Lesser foraminal narrowing is present on the right.  In addition, there is new inferior extension of high density and air within the anterior epidural space asymmetric to the left. This exerts mild mass effect on the thecal sac which is displaced posteriorly.  No anterior paraspinal inflammatory changes or fluid collections are identified.  There is no endplate destruction to suggest osteomyelitis.  Postsurgical changes are noted status post right renal transplant. 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  The native right kidney demonstrates moderate hydronephrosis, new from 2009 and suboptimally visualized on more recent studies. A calcified gallstone is again noted.  There is stable mild disc bulging at L1-L2 and L2-L3.  No spinal stenosis or nerve root encroachment results.  L3-L4: As above.  L4-L5:  Similar annular disc bulging with a small left paracentral disc protrusion and bilateral facet hypertrophy.  There is mild resulting left lateral recess stenosis.  No foraminal compromise or exiting L4 nerve root encroachment is identified.  L5-S1:  Stable annular disc bulging, central disc protrusion and associated vacuum phenomenon.  No sacral nerve root displacement or significant foraminal compromise is identified.  There is stable mild bilateral facet hypertrophy.  IMPRESSION:  1.  Interval postsurgical changes at L3-L4. An epidural air fluid collection extends inferiorly from the disc space behind the  L4 vertebral body and exerts mass effect on the thecal sac.  As this appears contiguous with the L3-L4 disc which demonstrates vacuum phenomenon, this may reflect an extruded disc fragment.  I cannot exclude an epidural abscess, although no generalized inflammatory changes are identified. There is persistent foraminal narrowing at L3-L4, worse on the left. 2.  No CT evidence of osteomyelitis. 3.  Stable disc protrusions at L4-L5 and L5-S1. 4.  Apparent hydronephrosis of the native right renal collecting system of uncertain significance and etiology.   Original Report Authenticated By: Carey Bullocks, M.D.   Ct Knee Right Wo Contrast  04/10/2013   *RADIOLOGY REPORT*  Clinical Data: Knee swelling and fever.  Question osteomyelitis.  CT OF THE RIGHT KNEE WITHOUT CONTRAST  Technique:  Multidetector CT imaging was performed according to the standard protocol. Multiplanar CT image reconstructions were also generated.  Comparison: None.  Findings: No bony destructive change or focal lesion is identified. Mild chondrocalcinosis is noted.  The patient has only a small joint effusion.  There is diffuse subcutaneous edema about the knee without focal subcutaneous fluid collection.  Mild degenerative change is seen in the medial compartment.  Atherosclerotic vascular disease is noted.  IMPRESSION:  1.  No CT evidence of osteomyelitis.  Please note that CT scan is not sensitive for the detection of osteomyelitis. 2.  Small joint effusion.  No focal fluid collection is present. 3.  Subcutaneous edema diffusely about the knee is likely due to dependent change or possibly cellulitis. 4.  Atherosclerosis.   Original Report Authenticated By: Holley Dexter, M.D.   Nm Pulmonary Perf And Vent  04/10/2013   *RADIOLOGY REPORT*  Clinical Data:  Shortness of breath, history hypertension, diabetes, coronary artery disease post MI and coronary angioplasty, CHF  NUCLEAR MEDICINE VENTILATION - PERFUSION LUNG SCAN  Technique:   Ventilation images were obtained in multiple projections using inhaled aerosol technetium 99 M DTPA.  Perfusion images were obtained in multiple projections after intravenous injection of Tc-85m MAA.  Radiopharmaceuticals:  Tc-73m DTPA aerosol and 6 mCi Tc-45m MAA.  Comparison: None Correlation:  Chest radiograph 04/09/2013  Findings:  Ventilation:  Absent ventilation in left lower lobe.  Enlargement of cardiac silhouette.  Mildly diminished ventilation in lung apices.  Perfusion:  Cardiomegaly.  Diminished perfusion throughout left lower lobe suspect related to the enlargement of cardiac silhouette, less severe than accompanying ventilatory loss. Minimal diminished perfusion in the left upper lobe matching ventilatory findings. No additional segmental or subsegmental perfusion defects.  IMPRESSION: Very low probability for pulmonary embolism.   Original Report Authenticated By: [name], M.D.   Dg Chest Port 1 View  04/09/2013   *RADIOLOGY REPORT*  Clinical Data: Hypoglycemia and shortness of breath  PORTABLE CHEST - 1 VIEW  Comparison: Chest radiograph 03/23/2013  Findings: Stable enlarged cardiac silhouette.  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  There are low lung volumes and central venous congestion.  No pneumothorax.  Lung bases poorly evaluated  IMPRESSION: Decreased lung volumes and central venous congestion. Cardiomegaly.   Original Report Authenticated By: Genevive Bi, M.D.    2D ECHO: Impressions:  - There is a large circumferential pericardial effusion. My impression is that there may be slight decrease in size of the effusion since 04/12/2013. However, this is only an imprression.    Disposition and Follow-up:  Future Appointments Provider Department Dept Phone   05/17/2013 9:00 AM Sherlene Shams, MD [facility] PRIMARY [facility] [place] [phone]       DISPOSITION: Inpatient rehabilitation DIET: Carb modified diet ACTIVITY: As tolerated  DISCHARGE FOLLOW-UP Follow-up  Information   Follow up with [facility], MD. Schedule an appointment as soon as possible for a visit in 2 weeks.   Contact information:   [address] [place] [address] [phone]       Follow up with Cristi Loron, MD. Schedule an appointment as soon as possible for a visit in 2 weeks.   Contact information:   [address] [place] [address] [phone]       Time spent on Discharge: 40 mins  Signed:   Kriya Westra M.D. [facility] 04/23/2013, 3:02 PM Pager: [phone]

## 2013-04-23 NOTE — Progress Notes (Signed)
Insurance has approved admission to ip rehab and bed is available today. I have contacted Dr. Isidoro Donning and she will d/c today. Patient is aware and in agreement. [phone]

## 2013-04-23 NOTE — Progress Notes (Signed)
Physical Therapy Treatment Patient Details Name: [name] MRN: [id number] DOB: Feb 01, 1946 Today's Date: 04/23/2013 Time: 7829-5621 PT Time Calculation (min): 18 min  PT Assessment / Plan / Recommendation Comments on Treatment Session  Pt very limited by decreased activity tolerance & LE weakness.       Follow Up Recommendations  CIR     Does the patient have the potential to tolerate intense rehabilitation     Barriers to Discharge        Equipment Recommendations  Rolling walker with 5" wheels    Recommendations for Other Services Rehab consult  Frequency Min 5X/week   Plan Discharge plan remains appropriate;Frequency remains appropriate    Precautions / Restrictions Precautions Precautions: Back;Fall Required Braces or Orthoses: Other Brace/Splint Other Brace/Splint: no back brace; Rt AFO Restrictions Weight Bearing Restrictions: No       Mobility  Bed Mobility Bed Mobility: Not assessed Transfers Transfers: Sit to Stand;Stand to Sit Sit to Stand: 4: Min assist;4: Min guard;With armrests;With upper extremity assist;From chair/3-in-1 Stand to Sit: 4: Min guard;With upper extremity assist;With armrests;To chair/3-in-1 Details for Transfer Assistance: (A) for steadiness with initial sit<>stand trials but then progressed to [employer].  Pt cont's to be slightly unsteady.  Cues for decreased trunk flexion with sit>stand Ambulation/Gait Ambulation/Gait Assistance: 4: Min guard Ambulation Distance (Feet): 60 Feet Assistive device: Rolling walker Ambulation/Gait Assistance Details: Heavy reliance on RW with UE's due to LE weakness & fatigue.  Cues for posture.  Pt able to correct posture when he stops but returns immediately back to flexed posture when he walks.   Gait Pattern: Step-through pattern;Decreased stride length;Decreased hip/knee flexion - right;Decreased hip/knee flexion - left;Trunk flexed;Narrow base of support Gait velocity: decreased General Gait Details:  Fatigues quickly Stairs: No Wheelchair Mobility Wheelchair Mobility: No      PT Goals Acute Rehab PT Goals Time For Goal Achievement: 04/27/13 Potential to Achieve Goals: Good Pt will go Supine/Side to Sit: with supervision Pt will go Sit to Supine/Side: with supervision Pt will go Sit to Stand: with supervision PT Goal: Sit to Stand - Progress: Progressing toward goal Pt will go Stand to Sit: with supervision PT Goal: Stand to Sit - Progress: Progressing toward goal Pt will Transfer Bed to Chair/Chair to Bed: with min assist Pt will Ambulate: 51 - 150 feet;with supervision;with least restrictive assistive device PT Goal: Ambulate - Progress: Progressing toward goal  Visit Information  Last PT Received On: 04/23/13 Assistance Needed: +1    Subjective Data      Cognition  Cognition Arousal/Alertness: Awake/alert Behavior During Therapy: WFL for tasks assessed/performed Overall Cognitive Status: Within Functional Limits for tasks assessed    Balance     End of Session PT - End of Session Equipment Utilized During Treatment: Gait belt Activity Tolerance: Patient tolerated treatment well;Patient limited by fatigue Patient left: in chair;with call bell/phone within reach Nurse Communication: Mobility status     [name], [place] [phone] 04/23/2013

## 2013-04-23 NOTE — Plan of Care (Addendum)
Overall Plan of Care [facility] Patient Details Name: KEYGAN DUMOND MRN: [id number] DOB: 10-29-1946  Diagnosis: lumbar HNP, deconditioning    Co-morbidities: Dm2, renal transplant, chf, htn   Functional Problem List  Patient demonstrates impairments in the following areas: Balance, Edema, Endurance, Medication Management, Nutrition, Pain and Sensory   Basic ADL's: eating, grooming, bathing, dressing and toileting Advanced ADL's: None at this time.  Transfers:  bed mobility, bed to chair, toilet, tub/shower, car and furniture Locomotion:  ambulation, wheelchair mobility and stairs  Additional Impairments:  Leisure Awareness  Anticipated Outcomes Item Anticipated Outcome  Eating/Swallowing    Basic self-care  Mod I  Tolieting  Mod I  Bowel/Bladder  Mod I  Transfers  Mod I  Locomotion  Mod I/Supervision  Communication    Cognition    Pain  Min assist   Safety/Judgment  Mod I  Other     Therapy Plan: PT Intensity: Minimum of 1-2 x/day ,45 to 90 minutes PT Frequency: 5 out of 7 days PT Duration Estimated Length of Stay: 7-10 days OT Intensity: Minimum of 1-2 x/day, 45 to 90 minutes OT Frequency: 5 out of 7 days OT Duration/Estimated Length of Stay: 7-10 days      Team Interventions: Item RN PT OT SLP SW TR Other  Self Care/Advanced ADL Retraining   x      Neuromuscular Re-Education  x x      Therapeutic Activities  x x   x   UE/LE Strength Training/ROM  x x   x   UE/LE Coordination Activities   x      Visual/Perceptual Remediation/Compensation         DME/Adaptive Equipment Instruction  x x   x   Therapeutic Exercise  x x   x   Balance/Vestibular Training x x x   x   Patient/Family Education x x x   x   Cognitive Remediation/Compensation         Functional Mobility Training x x x   x   Ambulation/Gait Training  x       Stair Training  x       Wheelchair Propulsion/Positioning  x x      Functional Tourist information centre manager Reintegration  x  x   x   Dysphagia/Aspiration Film/video editor         Bladder Management         Bowel Management         Disease Management/Prevention  x x      Pain Management x x x   x   Medication Management x        Skin Care/Wound Management x x x      Splinting/Orthotics  x x      Discharge Planning x x x   x   Psychosocial Support x x x   x                          Team Discharge Planning: Destination: PT- Home ,OT- Home , SLP-  Projected Follow-up: PT-Home health PT;Other (comment) (intermittant supervision), OT-  None, SLP-  Projected Equipment Needs: PT-Other (comment) (TBD), OT-  (TBD), SLP-  Patient/family involved in discharge planning: PT- Patient,  OT-Patient, SLP-   MD ELOS: 7-10 days Medical Rehab Prognosis:  Excellent Assessment: The patient has been admitted for CIR therapies. The team  will be addressing, functional mobility, strength, stamina, balance, safety, adaptive techniques/equipment, self-care, bowel and bladder mgt, patient and caregiver education, pain mgt, orthotic evaluation and adjustment, education. Goals have been set at Cedric Fishman, MD, [facility]      See Team Conference Notes for weekly updates to the plan of care

## 2013-04-23 NOTE — Progress Notes (Signed)
Occupational Therapy Treatment Patient Details Name: [name] MRN: [id number] DOB: 1946-02-21 Today's Date: 04/23/2013 Time: 4098-1191 OT Time Calculation (min): 28 min  OT Assessment / Plan / Recommendation Comments on Treatment Session Pt admitted with recurrent HNP and SIRS. Underwent redo lumbar diskectomy and laminectomy on 04/19/13. Pt would still benefit from CIR stay to increase strength, activity tolerance, safety, and caregiver education (he is worried about being a burden on his wife)    Follow Up Recommendations  CIR    Barriers to Discharge   Pt concerned wife will not be able to take care of him.       Recommendations for Other Services Rehab consult  Frequency Min 2X/week   Plan Discharge plan remains appropriate    Precautions / Restrictions Precautions Precautions: Back;Fall Required Braces or Orthoses: Other Brace/Splint Other Brace/Splint: no back brace; Rt AFO Restrictions Weight Bearing Restrictions: No   Pertinent Vitals/Pain Pt reports no pain.    ADL  Grooming: Performed;Wash/dry hands;Shaving;Min guard (Pt fatigued after ambulating 5 ft and requested to sit) Where Assessed - Grooming: Supported sitting Toilet Transfer: Performed;Minimal assistance (from 3in1 with heavy UE support and mod cues for precautions) Toilet Transfer Method: Sit to stand Toilet Transfer Equipment: Raised toilet seat with arms (or 3-in-1 over toilet) Toileting - Clothing Manipulation and Hygiene: Performed;Minimal assistance Where Assessed - Engineer, mining and Hygiene: Sit to stand from 3-in-1 or toilet Equipment Used: Gait belt;Rolling walker Transfers/Ambulation Related to ADLs: Pt requires mod verbal and tactile cues for maintaining back precautions. Pt is unsafe and requires min assistance with sit <> stand. Pt has LT LE lag and advancing gait to bathroom with RT LE. PT demonstrates decr motor planning. Pt needs continued repetition and CIR to progress  ambulation for basic transfer from chair to sink level ADL. ADL Comments: Pt on 3in1 when OTS entered the room. Pt unable to perform peri care standing while maintaining precautions (decr leg strenth) Pt educated on leaning left and right to perform peri care. Pt exhibited decr safety awareness with DME letting the RW get too far ahead of him. Pt ambulated with LT LE lagging behind RT LE, and Pt relying heavily on UE support on RW (white knuckles). Pt reached sink, but was requested to sit during ADL due to fatigue. Pt prompted several times to stand at sink, and Pt stated that he was not comfortable with that and that he did not trust his balance. (Heavily relies on UE support to stand upright and balance). Pt able to perform ADL with supported sitting at sink requiring incr time to catch breath.     OT Goals Acute Rehab OT Goals OT Goal Formulation: With patient Time For Goal Achievement: 05/04/13 Potential to Achieve Goals: Good ADL Goals Pt Will Perform Grooming: with supervision;Standing at sink ADL Goal: Grooming - Progress: Progressing toward goals Pt Will Perform Lower Body Bathing: with supervision;Sit to stand from bed;with adaptive equipment Pt Will Perform Lower Body Dressing: with supervision;Sit to stand from bed;with adaptive equipment Pt Will Transfer to Toilet: with supervision;Ambulation;Comfort height toilet;Grab bars;Maintaining back safety precautions ADL Goal: Toilet Transfer - Progress: Progressing toward goals Pt Will Perform Toileting - Clothing Manipulation: with supervision;Standing ADL Goal: Toileting - Clothing Manipulation - Progress: Progressing toward goals Pt Will Perform Toileting - Hygiene: with modified independence;Sitting on 3-in-1 or toilet ADL Goal: Toileting - Hygiene - Progress: Progressing toward goals Pt Will Perform Tub/Shower Transfer: Shower transfer;with supervision;Ambulation;with DME;Maintaining back safety precautions Miscellaneous OT  Goals Miscellaneous OT  Goal #1: Pt will perform bed mobility modified independently in prep for ADL.  Visit Information  Last OT Received On: 04/23/13 Assistance Needed: +1          Cognition  Cognition Arousal/Alertness: Awake/alert Behavior During Therapy: WFL for tasks assessed/performed Overall Cognitive Status: Within Functional Limits for tasks assessed    Mobility  Bed Mobility Bed Mobility: Not assessed Transfers Transfers: Sit to Stand;Stand to Sit Sit to Stand: 4: Min assist;4: Min guard;With upper extremity assist;From chair/3-in-1 Stand to Sit: 4: Min guard;With upper extremity assist;With armrests;To chair/3-in-1 Details for Transfer Assistance: (A) for steadiness with sit<>stand Pt providing heavy UE assist  Pt cont's to be unsteady on transfers.  mod cues to maintain precautions.          End of Session OT - End of Session Equipment Utilized During Treatment: Gait belt;Other (comment) (RW) Activity Tolerance: Patient limited by fatigue Patient left: in chair;with call bell/phone within reach Nurse Communication: Mobility status  GO     Sherryl Manges 04/23/2013, 10:48 AM

## 2013-04-23 NOTE — H&P (Signed)
Chief Complaint   Patient presents with   .  Multiple medical issues and recent decompression for lumbar radiculopathy.   :  HPI: [name] is a 67 y.o. right-handed male with history of renal transplant, ICD/pacemaker, systolic congestive heart failure, diabetes mellitus with peripheral neuropathy and chronic right foot drop. Patient with recent low back surgery 2 weeks ago for L3 decompression discectomy brought to the emergency room 04/09/2013 with altered mental status, mild shortness of breath. Patient noted to have low-grade fever with evidence of RLE cellulitis. He was started on IV antibiotics for SIRS. V/Q scan with low suspicion of PE. EKG paced rhythm with ST elevation in the inferior leads and 2D echo with evidence of large pleural effusion. LHC was consulted and felt ST changes non-specific and patient with evidence of volume overload without failure. He was started on colchicine and repeat echo Nephrology consulted for input on renal failure and recommended increasing diuretics to help with edema as well as effusion. Renal U/S without hydronephrosis. Was started on IV iron with aranesp for anemia of chronic disease.  CT of right knee showed no evidence of osteomyelitis there is a small joint effusion with subcutaneous edema diffusely about the knee likely due to possible cellulitis. Venous Doppler studies lower extremity showed no signs of DVT. CT lumbar spine showed interval postsurgical changes lumbar L3-4. An epidural air-fluid collection extending inferiorly from the disc space behind the L4 vertebral body and exerts mass effect on the thecal sac. Films evaluated by Dr. Gerlene Fee who felt that xray's with post op changes and doubtedt infection. He did develop diarrhea with worsening of renal status therefore colchicine discontinued and diuretics decreased back to lower dosage. Repeat echo 06/03 with slight decrease in size of effusion. 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 Lovell Sheehan. Back and hip pain improved.  Review of Systems  Respiratory: Negative for cough and shortness of breath.  Cardiovascular: Negative for chest pain and palpitations.  Gastrointestinal: Negative for heartburn, nausea, abdominal pain, diarrhea and constipation.  Genitourinary: Negative for urgency and frequency.  Musculoskeletal: Negative for myalgias and back pain.  Neurological: Positive for focal weakness (LLE) and weakness. Negative for headaches.  Endo/Heme/Allergies: Bruises/bleeds easily (Oldest living renal transplant recipient in [place]. [place] from [place]  Psychiatric/Behavioral: The patient is not nervous/anxious and does not have insomnia.   Past Medical History   Diagnosis  Date   .  Myocardial infarction  04/12/1990   .  CHF (congestive heart failure)  01/2012   .  Hypertension    .  Diabetes mellitus    .  ICD (implantable cardiac defibrillator) in place    .  Pacemaker  02/15/2012     [employer]   .  Pneumonia  2011   .  Arthritis    .  History of blood transfusion    .  Chronic kidney disease      Glomerulonephritis   .  Dysrhythmia    .  Cancer      Bladder Cancer- 2004 . SkinCancer- Basal, Squamous, and a few melymona   .  Right foot drop     Past Surgical History   Procedure  Laterality  Date   .  Kidney transplant   02/13/1973   .  Coronary angioplasty   03/1990   .  Skin cancer excision       Numerous   .  Eye surgery  Cataract Right Eye   .  Insert / replace / remove pacemaker     .  Uretheral transplation       Left to Right (transplanted kidney)   .  Skin transplant       Left thigh to Left hand   .  Knee arthroscopy   07/25/2012     Procedure: ARTHROSCOPY KNEE; Surgeon: Cammy Copa, MD; Location: [facility] OR; Service: Orthopedics; Laterality: Left; Left knee arthroscopy, debridement, cultures   .  Lumbar laminectomy/decompression microdiscectomy  Bilateral  03/26/2013      Procedure: LUMBAR LAMINECTOMY/DECOMPRESSION MICRODISCECTOMY 1 LEVEL; Surgeon: Reinaldo Meeker, MD; Location: [facility]; Service: Neurosurgery; Laterality: Bilateral;   .  Lumbar laminectomy/decompression microdiscectomy  N/A  04/19/2013     Procedure: Redo Lumbar Three to Four Decompression One LEVEL; Surgeon: Cristi Loron, MD; Location: [facility]; Service: Neurosurgery; Laterality: N/A; Redo Lumbar Three to Four Decompression One LEVEL    History reviewed. No pertinent family history.  Social History: Married. Independent prior to back surgery. Moved to new home in [place] recently. He reports that he quit smoking about 30 years ago. He does not have any smokeless tobacco history on file. He reports that he does not drink alcohol or use illicit drugs.  Allergies: No Known Allergies  Medications Prior to Admission   Medication  Sig  Dispense  Refill   .  ALPRAZolam (XANAX) 0.25 MG tablet  Take 0.25 mg by mouth at bedtime as needed for sleep or anxiety.     Marland Kitchen  aspirin EC 81 MG tablet  Take 81 mg by mouth every evening.     .  carvedilol (COREG) 12.5 MG tablet  Take 12.5 mg by mouth 2 (two) times daily with a meal.     .  fish oil-omega-3 fatty acids 1000 MG capsule  Take 1 g by mouth daily.     .  furosemide (LASIX) 40 MG tablet  Take 40 mg by mouth daily as needed (if weight is above 173 lbs).     .  insulin aspart (NOVOLOG) 100 UNIT/ML injection  Inject 6 Units into the skin 3 (three) times daily before meals.     .  insulin detemir (LEVEMIR) 100 UNIT/ML injection  Inject 40 Units into the skin at bedtime.     .  Multiple Vitamin (MULTIVITAMIN WITH MINERALS) TABS  Take 1 tablet by mouth daily.     .  mycophenolate (CELLCEPT) 500 MG tablet  Take 500 mg by mouth 2 (two) times daily.     .  nitroGLYCERIN (NITROSTAT) 0.4 MG SL tablet  Place 0.4 mg under the tongue every 5 (five) minutes as needed for chest pain. x3 doses as needed for chest pain     .  oxyCODONE-acetaminophen  (PERCOCET/ROXICET) 5-325 MG per tablet  Take 1 tablet by mouth every 4 (four) hours as needed for pain. For pain     .  predniSONE (DELTASONE) 5 MG tablet  Take 5 mg by mouth 2 (two) times daily.     Marland Kitchen  pyridOXINE (VITAMIN B-6) 100 MG tablet  Take 200 mg by mouth daily.     .  ranitidine (ZANTAC) 300 MG tablet  Take 300 mg by mouth at bedtime.     .  simvastatin (ZOCOR) 80 MG tablet  Take 80 mg by mouth at bedtime.     Marland Kitchen  tobramycin (TOBREX) 0.3 % ophthalmic solution  Place 1 drop into both eyes as needed (for  allergies).     .  vitamin B-12 (CYANOCOBALAMIN) 250 MCG tablet  Take 250 mcg by mouth daily.     .  vitamin C (ASCORBIC ACID) 500 MG tablet  Take 500 mg by mouth daily.      Home:  Home Living  Lives With: Spouse  Available Help at Discharge: Family  Type of Home: House  Home Access: Level entry  Home Layout: One level  Bathroom Shower/Tub: Pension scheme manager: Handicapped height  Bathroom Accessibility: Yes  How Accessible: Accessible via wheelchair;Accessible via walker  Home Adaptive Equipment: Grab bars around toilet;Grab bars in shower;Shower chair with back;Quad cane;Raised toilet seat with rails;Walker - rolling  Additional Comments: Pt and wife built a fully handicap accessible house  Functional History:  Prior Function  Bath: Moderate  Dressing: Moderate  Able to Take Stairs?: No  Driving: Yes  Vocation: Retired  Comments: Was using an office chair to roll around in to get around house.  Functional Status:  Mobility:  Bed Mobility  Bed Mobility: Not assessed  Rolling Right: 5: Supervision;With rail  Rolling Right: Patient Percentage: 50%  Rolling Left: 1: +2 Total assist;With rail  Rolling Left: Patient Percentage: 30%  Right Sidelying to Sit: 5: Supervision;With rails  Right Sidelying to Sit: Patient Percentage: 50%  Left Sidelying to Sit: 1: +2 Total assist  Left Sidelying to Sit: Patient Percentage: 30%  Sitting - Scoot to Edge of Bed: 4: Min  guard  Sit to Supine: 1: +1 Total assist;Other (comment);HOB flat (A to lift BLE)  Sit to Sidelying Right: 1: +2 Total assist;HOB flat  Sit to Sidelying Right: Patient Percentage: 40%  Transfers  Transfers: Sit to Stand;Stand to Sit  Sit to Stand: 4: Min assist;4: Min guard;With upper extremity assist;From chair/3-in-1  Sit to Stand: Patient Percentage: 60%  Stand to Sit: 4: Min guard;With upper extremity assist;With armrests;To chair/3-in-1  Stand to Sit: Patient Percentage: 50%  Stand Pivot Transfers: 1: +2 Total assist  Stand Pivot Transfers: Patient Percentage: 70%  Ambulation/Gait  Ambulation/Gait Assistance: 4: Min guard  Ambulation/Gait: Patient Percentage: 70%  Ambulation Distance (Feet): 60 Feet  Assistive device: Rolling walker  Ambulation/Gait Assistance Details: Heavy reliance on RW with UE's due to LE weakness & fatigue. Cues for posture. Pt able to correct posture when he stops but returns immediately back to flexed posture when he walks.  Gait Pattern: Step-through pattern;Decreased stride length;Decreased hip/knee flexion - right;Decreased hip/knee flexion - left;Trunk flexed;Narrow base of support  Gait velocity: decreased  General Gait Details: Fatigues quickly  Stairs: No  Wheelchair Mobility  Wheelchair Mobility: No  ADL:  ADL  Eating/Feeding: Independent  Where Assessed - Eating/Feeding: Chair  Grooming: Performed;Wash/dry hands;Shaving;Min guard (Pt fatigued after ambulating 5 ft and requested to sit)  Where Assessed - Grooming: Supported sitting  Upper Body Bathing: Minimal assistance  Where Assessed - Upper Body Bathing: Unsupported sitting  Lower Body Bathing: +1 Total assistance  Where Assessed - Lower Body Bathing: Unsupported sitting;Supported sit to stand  Upper Body Dressing: Performed;Set up  Where Assessed - Upper Body Dressing: Unsupported sitting  Lower Body Dressing: Performed;Moderate assistance;Other (comment) (with AE)  Where Assessed - Lower  Body Dressing: Unsupported sitting  Toilet Transfer: Performed;Minimal assistance (from 3in1 with heavy UE support and mod cues for precautions)  Toilet Transfer Method: Sit to stand  Toilet Transfer Equipment: Raised toilet seat with arms (or 3-in-1 over toilet)  Equipment Used: Gait belt;Rolling walker  Transfers/Ambulation Related to ADLs: Pt requires mod  verbal and tactile cues for maintaining back precautions. Pt is unsafe and requires min assistance with sit <> stand. Pt has LT LE lag and advancing gait to bathroom with RT LE. PT demonstrates decr motor planning. Pt needs continued repetition and CIR to progress ambulation for basic transfer from chair to sink level ADL.  ADL Comments: Pt on 3in1 when OTS entered the room. Pt unable to perform peri care standing while maintaining precautions (decr leg strenth) Pt educated on leaning left and right to perform peri care. Pt exhibited decr safety awareness with DME letting the RW get too far ahead of him. Pt ambulated with LT LE lagging behind RT LE, and Pt relying heavily on UE support on RW (white knuckles). Pt reached sink, but was requested to sit during ADL due to fatigue. Pt prompted several times to stand at sink, and Pt stated that he was not comfortable with that and that he did not trust his balance. (Heavily relies on UE support to stand upright and balance). Pt able to perform ADL with supported sitting at sink requiring incr time to catch breath.  Cognition:  Cognition  Overall Cognitive Status: Within Functional Limits for tasks assessed  Arousal/Alertness: Awake/alert  Orientation Level: Oriented X4  Cognition  Arousal/Alertness: Awake/alert  Behavior During Therapy: WFL for tasks assessed/performed  Overall Cognitive Status: Within Functional Limits for tasks assessed  Physical Exam:  Blood pressure 159/82, pulse 78, temperature 98.5 F (36.9 C), temperature source Oral, resp. rate 15, height 6\' 1"  (1.854 m), weight 74.6 kg (164  lb 7.4 oz), SpO2 99.00%.  Physical Exam  Nursing note and vitals reviewed.  Constitutional: He is oriented to person, place, and time. He appears well-developed and well-nourished.  HENT:  Head: Normocephalic and atraumatic.  Eyes: Pupils are equal, round, and reactive to light.  Cardiovascular: Normal rate and regular rhythm.  Pulmonary/Chest: Effort normal.  Abdominal: Soft. Bowel sounds are normal.  Musculoskeletal: He exhibits edema (1+ edema right foot).  Neurological: He is alert and oriented to person, place, and time.  Skin: Skin is warm. Rash (Multiple skin lesions with eccymotic areas exposed areas as well as scars from healed abrasions. ) noted.  Back incision with OpSite in place.  Upper body strength 4/5 in bilateral deltoid, biceps, triceps, grip Right lower extremity 4 minus hip flexor knee extensor 1/5 in right ankle dorsiflexor Left lower extremity 3 minus hip flexor knee extensor and ankle dorsiflexor plantar flexor Sensory mildly reduced in both feet Results for orders placed during the hospital encounter of 04/09/13 (from the past 48 hour(s))   GLUCOSE, CAPILLARY Status: Abnormal    Collection Time    04/21/13 12:14 PM   Result  Value  Range    Glucose-Capillary  153 (*)  70 - 99 mg/dL   GLUCOSE, CAPILLARY Status: Abnormal    Collection Time    04/21/13 4:44 PM   Result  Value  Range    Glucose-Capillary  149 (*)  70 - 99 mg/dL   GLUCOSE, CAPILLARY Status: Abnormal    Collection Time    04/21/13 11:23 PM   Result  Value  Range    Glucose-Capillary  107 (*)  70 - 99 mg/dL   GLUCOSE, CAPILLARY Status: None    Collection Time    04/22/13 6:26 AM   Result  Value  Range    Glucose-Capillary  98  70 - 99 mg/dL    Comment 1  Notify RN    GLUCOSE, CAPILLARY Status: Abnormal  Collection Time    04/22/13 11:53 AM   Result  Value  Range    Glucose-Capillary  122 (*)  70 - 99 mg/dL   GLUCOSE, CAPILLARY Status: Abnormal    Collection Time    04/22/13 4:50 PM    Result  Value  Range    Glucose-Capillary  254 (*)  70 - 99 mg/dL   GLUCOSE, CAPILLARY Status: Abnormal    Collection Time    04/22/13 9:00 PM   Result  Value  Range    Glucose-Capillary  173 (*)  70 - 99 mg/dL    Comment 1  Notify RN    RENAL FUNCTION PANEL Status: Abnormal    Collection Time    04/23/13 4:45 AM   Result  Value  Range    Sodium  137  135 - 145 mEq/L    Potassium  3.5  3.5 - 5.1 mEq/L    Comment:  DELTA CHECK NOTED    Chloride  100  96 - 112 mEq/L    CO2  26  19 - 32 mEq/L    Glucose, Bld  135 (*)  70 - 99 mg/dL    BUN  39 (*)  6 - 23 mg/dL    Creatinine, Ser  1.61 (*)  0.50 - 1.35 mg/dL    Calcium  8.6  8.4 - 10.5 mg/dL    Phosphorus  4.5  2.3 - 4.6 mg/dL    Albumin  2.6 (*)  3.5 - 5.2 g/dL    GFR calc non Af Amer  25 (*)  >90 mL/min    GFR calc Af Amer  29 (*)  >90 mL/min    Comment:      The eGFR has been calculated     using the CKD EPI equation.     This calculation has not been     validated in all clinical     situations.     eGFR's persistently     <90 mL/min signify     possible Chronic Kidney Disease.   GLUCOSE, CAPILLARY Status: Abnormal    Collection Time    04/23/13 7:12 AM   Result  Value  Range    Glucose-Capillary  122 (*)  70 - 99 mg/dL    Comment 1  Notify RN     No results found.  Post Admission Physician Evaluation:  1. Functional deficits secondary to recurrent L3-L4 disc in a patient with multiple medical problems including pericardial effusions, congestive heart failure, renal failure, sepsis. 2. Patient is admitted to receive collaborative, interdisciplinary care between the physiatrist, rehab nursing staff, and therapy team. 3. Patient's level of medical complexity and substantial therapy needs in context of that medical necessity cannot be provided at a lesser intensity of care such as a SNF. 4. Patient has experienced substantial functional loss from his/her baseline which was documented above under the "Functional History"  and "Functional Status" headings. Judging by the patient's diagnosis, physical exam, and functional history, the patient has potential for functional progress which will result in measurable gains while on inpatient rehab. These gains will be of substantial and practical use upon discharge in facilitating mobility and self-care at the household level. 5. Physiatrist will provide 24 hour management of medical needs as well as oversight of the therapy plan/treatment and provide guidance as appropriate regarding the interaction of the two. 6. 24 hour rehab nursing will assist with bladder management, bowel management, safety, skin/wound care, disease management, medication administration, pain management and  patient education and help integrate therapy concepts, techniques,education, etc. 7. PT will assess and treat for/with: Pre-gait training, gait training, endurance, safety, equipment, strengthening. Goals are: Modified independent level mobility. 8. OT will assess and treat for/with: ADLs, cognitive perceptual skills, upper body strengthening, endurance, safety, equipment. Goals are: Modified independent level ADLs. 9. SLP will assess and treat for/with: Not applicable. Goals are: Not applicable. 10. Case Management and Social Worker will assess and treat for psychological issues and discharge planning. 11. Team conference will be held weekly to assess progress toward goals and to determine barriers to discharge. 12. Patient will receive at least 3 hours of therapy per day at least 5 days per week. 13. ELOS: 7-10 days Prognosis: good Medical Problem List and Plan:  1. DVT Prophylaxis/Anticoagulation: Pharmaceutical: Lovenox  2. Pain Management: prn oxycodone effective.  3. Mood: Motivated. Is feeling better past second surgery. Will have LCSW follow for evaluation.  4. Neuropsych: This patient is capable of making decisions on his/her own behalf.  5. Renal transplant/ acute on chronic renal failure:  Baseline Cr 2.2-2.4. Renal status improving with decrease in lasix and resolution of diarrhea.  6. Dm type 2: Continue levemir  7. Large pericardial effusion without tamponade: For 2D echo in several weeks past discharge.  8. LE cellulitis: resolved. Diarrhea improving off antibiotics. C-diff check negative. Will continue to monitor.  9. 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 Fluid overload improved. Continue coreg, lasix 80 mg/day,  10. HTN: will monitor with bid checks. Continue lasix and coreg.  11. Anemia of chronic disease: treated with IV iron and on aranesp  12. Constipation with nausea: Reglan added yesterday--will discontinue as symptoms resolved. He would prefer meds discontinued and does not want any scheduled bowel program for now.

## 2013-04-23 NOTE — Progress Notes (Signed)
[facility] ROUNDING NOTE   Subjective:   Interval History: no complaints  Objective:  Vital signs in last 24 hours:  Temp:  [98 F (36.7 C)-98.6 F (37 C)] 98.1 F (36.7 C) (06/09 0606) Pulse Rate:  [76-93] 77 (06/09 0606) Resp:  [16-18] 16 (06/09 0606) BP: (141-171)/(71-86) 168/86 mmHg (06/09 0606) SpO2:  [98 %-100 %] 100 % (06/09 0606) Weight:  [74.6 kg (164 lb 7.4 oz)] 74.6 kg (164 lb 7.4 oz) (06/09 0717)  Weight change:  Filed Weights   04/21/13 0500 04/22/13 0624 04/23/13 0717  Weight: 78.1 kg (172 lb 2.9 oz) 76.6 kg (168 lb 14 oz) 74.6 kg (164 lb 7.4 oz)    Intake/Output: I/O last 3 completed shifts: In: 240 [P.O.:240] Out: 1900 [Urine:1900]   Intake/Output this shift:     CVS- RRR RS- CTA ABD- BS present soft non-distended EXT- trace edema   Basic Metabolic Panel:  Recent Labs Lab 04/18/13 0505 04/19/13 0400 04/20/13 0545 04/21/13 0510 04/23/13 0445  NA 140 137 139 136 137  K 3.7 3.6 3.9 4.3 3.5  CL 100 99 100 98 100  CO2 27 27 22 26 26   GLUCOSE 142* 172* 128* 117* 135*  BUN 45* 41* 38* 39* 39*  CREATININE 3.05* 2.74* 2.58* 2.68* 2.54*  CALCIUM 8.9 8.7 8.8 8.8 8.6  PHOS  --   --   --   --  4.5    Liver Function Tests:  Recent Labs Lab 04/19/13 0400 04/23/13 0445  AST 15  --   ALT 23  --   ALKPHOS 88  --   BILITOT 0.3  --   PROT 5.2*  --   ALBUMIN 2.6* 2.6*   No results found for this basename: LIPASE, AMYLASE,  in the last 168 hours No results found for this basename: AMMONIA,  in the last 168 hours  CBC:  Recent Labs Lab 04/18/13 0505 04/19/13 0400 04/20/13 0545  WBC 9.7 7.7 9.3  HGB 9.3* 9.5* 9.8*  HCT 30.2* 30.4* 31.6*  MCV 93.5 94.4 95.2  PLT 172 166 162    Cardiac Enzymes: No results found for this basename: CKTOTAL, CKMB, CKMBINDEX, TROPONINI,  in the last 168 hours  BNP: No components found with this basename: POCBNP,   CBG:  Recent Labs Lab 04/22/13 0626 04/22/13 1153 04/22/13 1650  04/22/13 2100 04/23/13 0712  GLUCAP 98 122* 254* 173* 122*    Microbiology: Results for orders placed during the hospital encounter of 04/09/13  CULTURE, BLOOD (ROUTINE X 2)     Status: None   Collection Time    04/09/13  6:45 PM      Result Value Range Status   Specimen Description BLOOD RIGHT ARM   Final   Special Requests BOTTLES DRAWN AEROBIC ONLY [facility]   Final   Culture  Setup Time 04/10/2013 01:04   Final   Culture NO GROWTH 5 DAYS   Final   Report Status 04/16/2013 FINAL   Final  CULTURE, BLOOD (ROUTINE X 2)     Status: None   Collection Time    04/09/13  6:50 PM      Result Value Range Status   Specimen Description BLOOD RIGHT WRIST   Final   Special Requests BOTTLES DRAWN AEROBIC ONLY 2CC   Final   Culture  Setup Time 04/10/2013 01:04   Final   Culture NO GROWTH 5 DAYS   Final   Report Status 04/16/2013 FINAL   Final  URINE CULTURE  Status: None   Collection Time    04/09/13  7:44 PM      Result Value Range Status   Specimen Description URINE, CATHETERIZED   Final   Special Requests NONE   Final   Culture  Setup Time 04/09/2013 20:44   Final   Colony Count NO GROWTH   Final   Culture NO GROWTH   Final   Report Status 04/10/2013 FINAL   Final  MRSA PCR SCREENING     Status: None   Collection Time    04/10/13 12:04 AM      Result Value Range Status   MRSA by PCR NEGATIVE  NEGATIVE Final   Comment:            The GeneXpert MRSA Assay (FDA     approved for NASAL specimens     only), is one component of a     comprehensive MRSA colonization     surveillance program. It is not     intended to diagnose MRSA     infection nor to guide or     monitor treatment for     MRSA infections.  CLOSTRIDIUM DIFFICILE BY PCR     Status: None   Collection Time    04/16/13  2:38 PM      Result Value Range Status   C difficile by pcr NEGATIVE  NEGATIVE Final  STOOL CULTURE     Status: None   Collection Time    04/16/13  2:38 PM      Result Value Range Status   Specimen  Description STOOL   Final   Special Requests NONE   Final   Culture     Final   Value: NO SALMONELLA, SHIGELLA, CAMPYLOBACTER, YERSINIA, OR E.COLI 0157:H7 ISOLATED     Note: REDUCED NORMAL FLORA PRESENT   Report Status 04/20/2013 FINAL   Final    Coagulation Studies: No results found for this basename: LABPROT, INR,  in the last 72 hours  Urinalysis: No results found for this basename: COLORURINE, APPERANCEUR, LABSPEC, PHURINE, GLUCOSEU, HGBUR, BILIRUBINUR, KETONESUR, PROTEINUR, UROBILINOGEN, NITRITE, LEUKOCYTESUR,  in the last 72 hours    Imaging: No results found.   Medications:     . antiseptic oral rinse  15 mL Mouth Rinse BID  . aspirin EC  325 mg Oral Daily  . atorvastatin  40 mg Oral q1800  . carvedilol  12.5 mg Oral BID WC  . darbepoetin (ARANESP) injection - NON-DIALYSIS  100 mcg Subcutaneous Q Thu-1800  . docusate sodium  100 mg Oral BID  . enoxaparin (LOVENOX) injection  30 mg Subcutaneous Q24H  . famotidine  20 mg Oral Daily  . fentaNYL  50 mcg Intravenous Once  . furosemide  80 mg Oral Daily  . hydrocerin   Topical BID  . insulin aspart  0-5 Units Subcutaneous QHS  . insulin aspart  0-9 Units Subcutaneous TID WC  . insulin detemir  10 Units Subcutaneous Daily  . metoCLOPramide  5 mg Oral TID AC  . mycophenolate  500 mg Oral BID  . omega-3 acid ethyl esters  1 g Oral Daily  . predniSONE  5 mg Oral BID  . pyridOXINE  200 mg Oral Daily  . sodium chloride  3 mL Intravenous Q12H  . vitamin B-12  250 mcg Oral Daily  . 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 AKI likely related to aggressive diuresis and volume depletion due to diarrhea.  1. Steady improvement in Scr since being switched over to po lasix daily rather than BID. 2. S/p CT myelogram without increase in Scr. Cont to follow.  Scr near baseline.  2.CHF- markedly improved volume status. EF 30-35%. On lower dose of po lasix (will hold pm dose today due to rise in Scr). Continues to diurese 3. Pericardial effusion without tamponade. Will need repeat ECHO 4 Left hip pain- CT myelogram revealed soft tissue mass in L4-5 territory (possible extruded material from disk impinging upon the thecal sac.  1. S/p disckectomy with Dr. Lovell Sheehan of Neurosurgery and doing well. 5.DM- stable  6.Diarrhea/Nausea- may benefit from immodium or lomotil. Likely related to ongoing abx 7. H/o Vfib arrest, s/p AICD 8 Immunosuppressive therapy- cont with MMF and pred  9Cellulitis- off Abx with improvement of diarrhea (completed 1 week of IV zosyn) 10 Anemia- normocytic. May benefit from ESA. Will cont to follow 11 Dispo- awaiting CIR placement  Will sign off follow up in [place] for Transplant      LOS: 14 Weyman Bogdon W @TODAY @8 :53 AM

## 2013-04-23 NOTE — Progress Notes (Signed)
I agree with the following treatment note after reviewing documentation.   Johnston, Karianna Gusman Brynn   OTR/L Pager: [phone] Office: [phone] .   

## 2013-04-23 NOTE — Progress Notes (Signed)
Patient admitted prior to 32. Patient alert and oriented x 4 . No complaint of pain . Back incision clean dry and intact with few steristrips in place . Skin noted to be flaky with multiple areas of scabbed lesions. Oriented patient to room and call bell system . Patient verbalized understanding of admission process. Continue with plan of care .  Cleotilde Neer

## 2013-04-23 NOTE — PMR Pre-admission (Signed)
PMR Admission Coordinator Pre-Admission Assessment  Patient: [name] is an 67 y.o., male MRN: [id number] DOB: February 03, 1946 Height: 6\' 1"  (185.4 cm) Weight: 74.6 kg (164 lb 7.4 oz)              Insurance Information HMO:     PPO: yes     PCP:      IPA:      80/20:      OTHER: [employer] PRIMARY: [employer]/ [employer]      Policy#: [id number]      Subscriber: pt CM Name: Florencia Reasons      Phone#: [phone]     Fax#: [phone] Pre-Cert#: [id number] cert for 5 days f/u CM will be Arvil Chaco [phone] opt 1     Employer: [employer] Benefits:  Phone #: [phone]     Name: 04/20/13 Eff. Date: 11/15/00     Deduct: $860      Out of Pocket Max: $5750      Life Max: none CIR: 80%      SNF: 80% Outpatient: 80%     Co-Pay: 20% Home Health: 80%      Co-Pay: 20% DME: 80%     Co-Pay: 20% Providers: in network  SECONDARY: none        Emergency Contact Information Contact Information   Name Relation Home Work Mobile   Bargo,Diane Spouse [phone]  [phone]     Current Medical History  Patient Admitting Diagnosis:Deconditioning, redo left L3-4 intervertebral discectomy History of Present Illness: [name] is a 67 y.o. right-handed male with history of renal transplant, ICD/pacemaker, systolic congestive heart failure, diabetes mellitus with peripheral neuropathy and chronic right foot drop. Patient with recent low back surgery 2 weeks ago for L3 decompression discectomy brought to the emergency room 04/09/2013 with altered mental status mild shortness of breath. Patient noted to have low-grade fever. Right lower extremity erythematous below the knee. EKG paced rhythm with ST elevation in the inferior leads. CT of right knee showed no evidence of osteomyelitis there is a small joint effusion with subcutaneous edema diffusely about the knee likely due to possible cellulitis. Venous Doppler studies lower extremity showed no signs of DVT. CT lumbar spine showed interval postsurgical  changes lumbar L3-4. An epidural air-fluid collection extending inferiorly from the disc space behind the L4 vertebral body and exerts mass effect on the thecal sac. 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 CT myelogram performed. Found to have soft tissue mass next to the thecal sac on the left. Dr. Lovell Sheehan took pt to OR 6/5 for left redo laminectomy and discectomy.Patient's pain decreased postop but continues with weakness, especially left lle. Renal ultrasound showed no evidence of hydronephrosis involving the transplant kidney. Followup cardiology to assess abnormal EKG not felt to be significant with repeat EKGs showing no changes. Mildly elevated cardiac enzymes likely demand ischemia and no further workup indicated. Echocardiogram completed showing large pericardial effusion without tamponade on and again advised to follow. Chest x-ray with mild CHF receiving IV diuresis with Lasix per cardiology services. Patient maintained on broad-spectrum antibiotic for suspect cellulitis right lower extremity.   Past Medical History  Past Medical History  Diagnosis Date  . Myocardial infarction 04/12/1990  . CHF (congestive heart failure) 01/2012  . Hypertension   . Diabetes mellitus   . ICD (implantable cardiac defibrillator) in place   . Pacemaker 02/15/2012  [employer]  . Pneumonia 2011  . Arthritis   . History of blood transfusion   . Chronic kidney disease     Glomerulonephritis  . Dysrhythmia   . Cancer     Bladder Cancer- 2004 .  SkinCancer- Basal, Squamous, and a few melymona  . Right foot drop     Family History  family history is not on file.  Prior Rehab/Hospitalizations: outpt rehab at [facility]  Current Medications  Current facility-administered medications:acetaminophen (TYLENOL) suppository 650 mg, 650 mg, Rectal, Q4H PRN, Cristi Loron, MD;  acetaminophen (TYLENOL) tablet 650  mg, 650 mg, Oral, Q4H PRN, Cristi Loron, MD;  albuterol (PROVENTIL) (5 MG/ML) 0.5% nebulizer solution 2.5 mg, 2.5 mg, Nebulization, Q2H PRN, Arthor Captain, PA-C;  ALPRAZolam Prudy Feeler) tablet 0.25 mg, 0.25 mg, Oral, QHS PRN, Eduard Clos, MD, 0.25 mg at 04/22/13 2151 alum & mag hydroxide-simeth (MAALOX/MYLANTA) 200-200-20 MG/5ML suspension 30 mL, 30 mL, Oral, Q6H PRN, Cristi Loron, MD;  antiseptic oral rinse (BIOTENE) solution 15 mL, 15 mL, Mouth Rinse, BID, Saima Rizwan, MD, 15 mL at 04/23/13 1053;  aspirin EC tablet 325 mg, 325 mg, Oral, Daily, Eduard Clos, MD, 325 mg at 04/23/13 1051;  atorvastatin (LIPITOR) tablet 40 mg, 40 mg, Oral, q1800, Eduard Clos, MD, 40 mg at 04/22/13 1703 carvedilol (COREG) tablet 12.5 mg, 12.5 mg, Oral, BID WC, Eduard Clos, MD, 12.5 mg at 04/23/13 1051;  darbepoetin (ARANESP) injection 100 mcg, 100 mcg, Subcutaneous, Q Thu-1800, Zada Girt, MD, 100 mcg at 04/12/13 1830;  diazepam (VALIUM) tablet 5 mg, 5 mg, Oral, Q6H PRN, Cristi Loron, MD;  docusate sodium (COLACE) capsule 100 mg, 100 mg, Oral, BID, Cristi Loron, MD, 100 mg at 04/23/13 1052 enoxaparin (LOVENOX) injection 30 mg, 30 mg, Subcutaneous, Q24H, Dennie Fetters, RPH, 30 mg at 04/22/13 1705;  famotidine (PEPCID) tablet 20 mg, 20 mg, Oral, Daily, Ripudeep K Rai, MD, 20 mg at 04/23/13 1051;  fentaNYL (SUBLIMAZE) injection 50 mcg, 50 mcg, Intravenous, Once, Judie Petit, MD;  furosemide (LASIX) tablet 80 mg, 80 mg, Oral, Daily, Irena Cords, MD, 80 mg at 04/23/13 1051 hydrocerin (EUCERIN) cream, , Topical, BID, Zada Girt, MD;  HYDROcodone-acetaminophen (NORCO/VICODIN) 5-325 MG per tablet 1-2 tablet, 1-2 tablet, Oral, Q4H PRN, Cristi Loron, MD;  HYDROmorphone (DILAUDID) injection 1 mg, 1 mg, Intravenous, Q2H PRN, Russella Dar, NP, 1 mg at 04/17/13 1330;  insulin aspart (novoLOG) injection 0-5 Units, 0-5 Units, Subcutaneous, QHS, Russella Dar,  NP insulin aspart (novoLOG) injection 0-9 Units, 0-9 Units, Subcutaneous, TID WC, Russella Dar, NP, 2 Units at 04/23/13 0758;  insulin detemir (LEVEMIR) injection 10 Units, 10 Units, Subcutaneous, Daily, Russella Dar, NP, 10 Units at 04/23/13 1052;  loperamide (IMODIUM) capsule 2 mg, 2 mg, Oral, PRN, Kathlen Mody, MD, 2 mg at 04/18/13 1245;  menthol-cetylpyridinium (CEPACOL) lozenge 3 mg, 1 lozenge, Oral, PRN, Cristi Loron, MD metoCLOPramide (REGLAN) tablet 5 mg, 5 mg, Oral, TID AC, Ripudeep K Rai, MD, 5 mg at 04/23/13 1051;  midazolam (VERSED) injection 1-2 mg, 1-2 mg, Intravenous, PRN, Judie Petit, MD;  morphine 2 MG/ML injection 1-4 mg, 1-4 mg, Intravenous, Q3H PRN, Cristi Loron, MD;  mycophenolate (CELLCEPT) capsule 500 mg, 500 mg, Oral, BID, Eduard Clos, MD, 500 mg at 04/23/13 1051 nitroGLYCERIN (NITROSTAT) SL tablet 0.4 mg, 0.4 mg, Sublingual, Q5 min PRN, Eduard Clos, MD;  omega-3 acid ethyl esters (LOVAZA) capsule 1 g, 1 g,  Oral, Daily, Eduard Clos, MD, 1 g at 04/23/13 1050;  ondansetron [facility]) injection 4 mg, 4 mg, Intravenous, Q4H PRN, Cristi Loron, MD, 4 mg at 04/22/13 0759 oxyCODONE-acetaminophen (PERCOCET/ROXICET) 5-325 MG per tablet 1-2 tablet, 1-2 tablet, Oral, Q4H PRN, Cristi Loron, MD, 2 tablet at 04/23/13 1226;  oxyCODONE-acetaminophen (PERCOCET/ROXICET) 5-325 MG per tablet 2 tablet, 2 tablet, Oral, Q4H PRN, Russella Dar, NP, 2 tablet at 04/20/13 1619;  phenol (CHLORASEPTIC) mouth spray 1 spray, 1 spray, Mouth/Throat, PRN, Cristi Loron, MD, 1 spray at 04/20/13 3244 predniSONE (DELTASONE) tablet 5 mg, 5 mg, Oral, BID, Eduard Clos, MD, 5 mg at 04/23/13 1052;  pyridOXINE (VITAMIN B-6) tablet 200 mg, 200 mg, Oral, Daily, Eduard Clos, MD, 200 mg at 04/23/13 1051;  sodium chloride 0.9 % injection 3 mL, 3 mL, Intravenous, Q12H, Eduard Clos, MD, 3 mL at 04/23/13 1052;  tobramycin (TOBREX) 0.3 % ophthalmic solution  1 drop, 1 drop, Both Eyes, PRN, Eduard Clos, MD, 1 drop at 04/13/13 0423 vitamin B-12 (CYANOCOBALAMIN) tablet 250 mcg, 250 mcg, Oral, Daily, Eduard Clos, MD, 250 mcg at 04/23/13 1052;  vitamin C (ASCORBIC ACID) tablet 500 mg, 500 mg, Oral, Daily, Eduard Clos, MD, 500 mg at 04/23/13 1051  Patients Current Diet: Carb Control  Precautions / Restrictions Precautions Precautions: Back;Fall Precaution Comments: Pt knew 3/3 back precautions, but unsure how to apply for bed mobility Other Brace/Splint: no back brace; Rt AFO Restrictions Weight Bearing Restrictions: No   Prior Activity Level Patient had first or procedure 3 1/2 weeks pta. He was rolling around in office chair due to pain and weakness. Dr. Gerlene Fee has told him to be on bedrest for 2 1/2 weeks postop and only up to go to bathroom or bed. Prior to initial surgery he was ambulatory but with a lot of pain. He had r foot drop since jan 2014 with afo and getting op rehab. Fell and cracked hip jan. 2014 and had partial hip replacement . Has been deconditioned since Jan 2014.  Home Assistive Devices / Equipment Home Assistive Devices/Equipment: CBG Meter;Cane (specify quad or straight);Walker (specify type) Home Adaptive Equipment: Grab bars around toilet;Grab bars in shower;Shower chair with back;Quad cane;Raised toilet seat with rails;Walker - rolling  Prior Functional Level Prior Function Level of Independence: Needs assistance Needs Assistance: Bathing;Dressing Bath: Moderate Dressing: Moderate Able to Take Stairs?: No Driving: Yes Vocation: Retired Comments: Was using an office chair to roll around in to get around house.  Current Functional Level Cognition  Arousal/Alertness: Awake/alert Overall Cognitive Status: Within Functional Limits for tasks assessed Orientation Level: Oriented X4    Extremity Assessment (includes Sensation/Coordination)  RUE ROM/Strength/Tone: WFL for tasks assessed (Pt would  like strength building regiment for BUE) RUE Coordination: Deficits RUE Coordination Deficits: difficulty opening packages  RLE ROM/Strength/Tone: Deficits RLE ROM/Strength/Tone Deficits: dorsiflexion 2/5, hip/knee grossly 3+/5 RLE Sensation: WFL - Light Touch    ADLs  Eating/Feeding: Independent Where Assessed - Eating/Feeding: Chair Grooming: Performed;Wash/dry hands;Shaving;Min guard (Pt fatigued after ambulating 5 ft and requested to sit) Where Assessed - Grooming: Supported sitting Upper Body Bathing: Minimal assistance Where Assessed - Upper Body Bathing: Unsupported sitting Lower Body Bathing: +1 Total assistance Where Assessed - Lower Body Bathing: Unsupported sitting;Supported sit to stand Upper Body Dressing: Performed;Set up Where Assessed - Upper Body Dressing: Unsupported sitting Lower Body Dressing: Performed;Moderate assistance;Other (comment) (with AE) Where Assessed - Lower Body Dressing: Unsupported sitting Toilet Transfer: Performed;Minimal assistance (from 3in1 with  heavy UE support and mod cues for precautions) Toilet Transfer: Patient Percentage: 60% Toilet Transfer Method: Sit to stand Toilet Transfer Equipment: Raised toilet seat with arms (or 3-in-1 over toilet) Toileting - Clothing Manipulation and Hygiene: Performed;Minimal assistance Where Assessed - Toileting Clothing Manipulation and Hygiene: Sit to stand from 3-in-1 or toilet Equipment Used: Gait belt;Rolling walker Transfers/Ambulation Related to ADLs: Pt requires mod verbal and tactile cues for maintaining back precautions. Pt is unsafe and requires min assistance with sit <> stand. Pt has LT LE lag and advancing gait to bathroom with RT LE. PT demonstrates decr motor planning. Pt needs continued repetition and CIR to progress ambulation for basic transfer from chair to sink level ADL. ADL Comments: Pt on 3in1 when OTS entered the room. Pt unable to perform peri care standing while maintaining precautions  (decr leg strenth) Pt educated on leaning left and right to perform peri care. Pt exhibited decr safety awareness with DME letting the RW get too far ahead of him. Pt ambulated with LT LE lagging behind RT LE, and Pt relying heavily on UE support on RW (white knuckles). Pt reached sink, but was requested to sit during ADL due to fatigue. Pt prompted several times to stand at sink, and Pt stated that he was not comfortable with that and that he did not trust his balance. (Heavily relies on UE support to stand upright and balance). Pt able to perform ADL with supported sitting at sink requiring incr time to catch breath.    Mobility  Bed Mobility: Not assessed Rolling Right: 5: Supervision;With rail Rolling Right: Patient Percentage: 50% Rolling Left: 1: +2 Total assist;With rail Rolling Left: Patient Percentage: 30% Right Sidelying to Sit: 5: Supervision;With rails Right Sidelying to Sit: Patient Percentage: 50% Left Sidelying to Sit: 1: +2 Total assist Left Sidelying to Sit: Patient Percentage: 30% Sitting - Scoot to Edge of Bed: 4: Min guard Sit to Supine: 1: +1 Total assist;Other (comment);HOB flat (A to lift BLE) Sit to Sidelying Right: 1: +2 Total assist;HOB flat Sit to Sidelying Right: Patient Percentage: 40%    Transfers  Transfers: Sit to Stand;Stand to Sit Sit to Stand: 4: Min assist;4: Min guard;With upper extremity assist;From chair/3-in-1 Sit to Stand: Patient Percentage: 60% Stand to Sit: 4: Min guard;With upper extremity assist;With armrests;To chair/3-in-1 Stand to Sit: Patient Percentage: 50% Stand Pivot Transfers: 1: +2 Total assist Stand Pivot Transfers: Patient Percentage: 70%    Ambulation / Gait / Stairs / Wheelchair Mobility  Ambulation/Gait Ambulation/Gait Assistance: 4: Min guard Ambulation/Gait: Patient Percentage: 70% Ambulation Distance (Feet): 60 Feet Assistive device: Rolling walker Ambulation/Gait Assistance Details: Heavy reliance on RW with UE's due to  LE weakness & fatigue.  Cues for posture.  Pt able to correct posture when he stops but returns immediately back to flexed posture when he walks.   Gait Pattern: Step-through pattern;Decreased stride length;Decreased hip/knee flexion - right;Decreased hip/knee flexion - left;Trunk flexed;Narrow base of support Gait velocity: decreased General Gait Details: Fatigues quickly Stairs: No Wheelchair Mobility Wheelchair Mobility: No    Posture / Games developer Sitting - Balance Support: Bilateral upper extremity supported;Feet supported Static Sitting - Level of Assistance: Not tested (comment) Static Sitting - Comment/# of Minutes: 5 Static Standing Balance Static Standing - Balance Support: Bilateral upper extremity supported Static Standing - Level of Assistance: 4: Min assist    Special needs/care consideration Bowel mgmt:continent Bladder mgmt: continent    Previous Home Environment Living Arrangements: Spouse/significant other Lives With: Spouse  Available Help at Discharge: Family Type of Home: House Home Layout: One level Home Access: Level entry Bathroom Shower/Tub: Health visitor: Handicapped height Bathroom Accessibility: Yes How Accessible: Accessible via wheelchair;Accessible via walker Home Care Services: Other (Comment) Additional Comments: Pt and wife built a fully handicap accessible house  Discharge Living Setting Plans for Discharge Living Setting: Patient's home;Lives with (comment);Other (Comment) (wife) Type of Home at Discharge: House Discharge Home Layout: One level Discharge Home Access: Level entry Discharge Bathroom Shower/Tub: Walk-in shower Discharge Bathroom Toilet: Handicapped height Discharge Bathroom Accessibility: Yes How Accessible: Accessible via wheelchair;Accessible via walker (home fully handicapped; built in retirement community) Do you have any problems obtaining your medications?:  No  Social/Family/Support Systems Patient Roles: Spouse Contact Information: Dieter Hane wife Anticipated Caregiver: wife Anticipated Industrial/product designer Information: see above Ability/Limitations of Caregiver: supervision level Caregiver Availability: 24/7 Discharge Plan Discussed with Primary Caregiver: Yes Is Caregiver In Agreement with Plan?: Yes Does Caregiver/Family have Issues with Lodging/Transportation while Pt is in Rehab?: No    Goals/Additional Needs Patient/Family Goal for Rehab: Mod I PT and OT Expected length of stay: ELOS 5 to 7 days Special Service Needs: history of kidney transplant in 1974 Additional Information: patient retired 03/2013. [employer] until he turns 38 and has medicare Pt/Family Agrees to Admission and willing to participate: Yes Program Orientation Provided & Reviewed with Pt/Caregiver Including Roles  & Responsibilities: Yes   Decrease burden of Care through IP rehab admission: n/a  Possible need for SNF placement upon discharge:not anticipated  Patient Condition: This patient's medical and functional status has changed since the consult dated:04/17/13 in which the Rehabilitation Physician determined and documented that the patient's condition is appropriate for intensive rehabilitative care in an inpatient rehabilitation facility. Medical changes are: redo laminectomy/discectomy on 04/19/13..  Functional changes are: overal min to minguard assist with adls as well as mobility. After evaluating the patient today and speaking with the Rehabilitation physician and acute team, the patient remains appropriate for inpatient rehab. Will admit to inpatient rehab today.  Preadmission Screen Completed By:  Clois Dupes, 04/23/2013 1:57 PM ______________________________________________________________________   Discussed status with Dr. Wynn Banker on 04/23/13 at  1501 and received telephone approval for admission today.  Admission Coordinator:   Clois Dupes, time 4098 Date 04/23/13.

## 2013-04-24 ENCOUNTER — Inpatient Hospital Stay (HOSPITAL_COMMUNITY): Payer: PRIVATE HEALTH INSURANCE | Admitting: Physical Therapy

## 2013-04-24 ENCOUNTER — Inpatient Hospital Stay (HOSPITAL_COMMUNITY): Payer: PRIVATE HEALTH INSURANCE | Admitting: Occupational Therapy

## 2013-04-24 ENCOUNTER — Inpatient Hospital Stay (HOSPITAL_COMMUNITY): Payer: PRIVATE HEALTH INSURANCE

## 2013-04-24 DIAGNOSIS — M5126 Other intervertebral disc displacement, lumbar region: Secondary | ICD-10-CM | POA: Diagnosis present

## 2013-04-24 LAB — CBC WITH DIFFERENTIAL/PLATELET
Basophils Absolute: 0 10*3/uL (ref 0.0–0.1)
Eosinophils Absolute: 0.1 10*3/uL (ref 0.0–0.7)
Eosinophils Relative: 1 % (ref 0–5)
HCT: 31.8 % — ABNORMAL LOW (ref 39.0–52.0)
Lymphocytes Relative: 5 % — ABNORMAL LOW (ref 12–46)
MCH: 29.6 pg (ref 26.0–34.0)
MCHC: 31.1 g/dL (ref 30.0–36.0)
MCV: 95.2 fL (ref 78.0–100.0)
Monocytes Absolute: 1 10*3/uL (ref 0.1–1.0)
Platelets: 152 10*3/uL (ref 150–400)
RDW: 17.7 % — ABNORMAL HIGH (ref 11.5–15.5)

## 2013-04-24 LAB — RENAL FUNCTION PANEL
CO2: 26 mEq/L (ref 19–32)
Calcium: 8.4 mg/dL (ref 8.4–10.5)
Creatinine, Ser: 2.45 mg/dL — ABNORMAL HIGH (ref 0.50–1.35)
GFR calc Af Amer: 30 mL/min — ABNORMAL LOW (ref >90)
GFR calc non Af Amer: 26 mL/min — ABNORMAL LOW (ref >90)
Phosphorus: 4.5 mg/dL (ref 2.3–4.6)
Sodium: 137 mEq/L (ref 135–145)

## 2013-04-24 LAB — GLUCOSE, CAPILLARY: Glucose-Capillary: 135 mg/dL — ABNORMAL HIGH (ref 70–99)

## 2013-04-24 MED FILL — White Petrolatum Gel: Qty: 28.35 | Status: AC

## 2013-04-24 NOTE — Progress Notes (Signed)
Patient information reviewed and entered into eRehab system by Alisia Vanengen, RN, CRRN, PPS Coordinator.  Information including medical coding and functional independence measure will be reviewed and updated through discharge.    

## 2013-04-24 NOTE — Progress Notes (Signed)
Occupational Therapy Note  Patient Details  Name: [name] MRN: [id number] Date of Birth: 09-27-1946 Today's Date: 04/24/2013  Time: 1400-1430 Pt denies pain Individual Therapy  Pt resting in bed upon arrival.  Pt stated 2/3 back precautions but functionally maintained precautions with simple tasks in room.  Pt performed sit<>stand from EOB and functional amb with RW.  Introduced AE and patient stated his wife was going to Tour manager.  Focus on education, functional amb with RW, bed mobility, and safety awareness.   Lavone Neri [facility] 04/24/2013, 2:53 PM

## 2013-04-24 NOTE — Evaluation (Signed)
Physical Therapy Assessment and Plan  Patient Details  Name: [name] MRN: [id number] Date of Birth: 29-Apr-1946  PT Diagnosis: Abnormality of gait, Difficulty walking, Low back pain and Muscle weakness Rehab Potential: Good ELOS: 7-10 days   Today's Date: 04/24/2013 Time: 7829-5621 Time Calculation (min): 60 min  Problem List:  Patient Active Problem List   Diagnosis Date Noted  . HNP (herniated nucleus pulposus), lumbar--redo microdisectomy 04/19/13 04/24/2013  . S/P laminectomy 04/22/2013  . Positive D dimer 04/10/2013  . Dehydration 04/10/2013  . Acute respiratory failure with hypoxia 04/10/2013  . Hx of decompressive lumbar laminectomy (L-3) HYQ65, 2014 04/10/2013  . Pericardial effusion 04/10/2013  . SIRS (systemic inflammatory response syndrome) 04/09/2013  . Demand ischemia 04/09/2013  . Diabetes mellitus type 2, controlled 04/09/2013  . Cellulitis of right lower extremity 04/09/2013  . CAD (coronary artery disease) 04/09/2013  . Renal failure (ARF), acute on chronic kidney disease stage 3 04/09/2013  . HTN (hypertension) 10/04/2012  . Septic arthritis of knee, left 08/23/2012  . History of renal transplant 08/23/2012    Past Medical History:  Past Medical History  Diagnosis Date  . Myocardial infarction 04/12/1990  . CHF (congestive heart failure) 01/2012  . Hypertension   . Diabetes mellitus   . ICD (implantable cardiac defibrillator) in place   . Pacemaker 02/15/2012    [employer]  . Pneumonia 2011  . Arthritis   . History of blood transfusion   . Chronic kidney disease     Glomerulonephritis  . Dysrhythmia   . Cancer     Bladder Cancer- 2004 .  SkinCancer- Basal, Squamous, and a few melymona  . Right foot drop    Past Surgical History:  Past Surgical History  Procedure Laterality Date  . Kidney transplant  02/13/1973  . Coronary angioplasty  03/1990  . Skin cancer excision      Numerous  . Eye surgery      Cataract Right Eye  . Insert /  replace / remove pacemaker    . Uretheral transplation      Left to Right (transplanted kidney)  . Skin transplant      Left thigh to Left hand  . Knee arthroscopy  07/25/2012    Procedure: ARTHROSCOPY KNEE;  Surgeon: Cammy Copa, MD;  Location: [facility] OR;  Service: Orthopedics;  Laterality: Left;  Left knee arthroscopy, debridement, cultures  . Lumbar laminectomy/decompression microdiscectomy Bilateral 03/26/2013    Procedure: LUMBAR LAMINECTOMY/DECOMPRESSION MICRODISCECTOMY 1 LEVEL;  Surgeon: Reinaldo Meeker, MD;  Location: [facility];  Service: Neurosurgery;  Laterality: Bilateral;  . Lumbar laminectomy/decompression microdiscectomy N/A 04/19/2013    Procedure: Redo Lumbar Three to Four Decompression One LEVEL;  Surgeon: Cristi Loron, MD;  Location: [facility];  Service: Neurosurgery;  Laterality: N/A;  Redo Lumbar Three to Four Decompression One LEVEL    Assessment & Plan Clinical Impression: [name] is a 67 y.o. right-handed male with history of renal transplant, ICD/pacemaker, systolic congestive heart failure, diabetes mellitus with peripheral neuropathy and chronic right foot drop. Patient with recent low back surgery 2 weeks ago for L3 decompression discectomy brought to the emergency room 04/09/2013 with altered mental status, mild shortness of breath. Patient noted to have low-grade fever with evidence of RLE cellulitis. EKG paced rhythm with ST elevation in the inferior leads and 2D echo with evidence of large pleural effusion. LHC was consulted and felt ST changes non-specific and patient with evidence of volume overload without failure. He was  started on colchicine and repeat echo Nephrology consulted for input on renal failure and recommended increasing diuretics to help with edema as well as effusion. Renal U/S without hydronephrosis. CT lumbar spine showed interval postsurgical changes lumbar L3-4. An epidural air-fluid collection extending inferiorly from the disc space  behind the L4 vertebral body and exerts mass effect on the thecal sac. Films evaluated by Dr. Gerlene Fee who felt that xray's with post op changes and doubted infection. He did develop diarrhea with worsening of renal status therefore colchicine discontinued and diuretics decreased back to lower dosage.  Patient underwent redo microdiskectomy by Dr. Lovell Sheehan. Back and hip pain improved. Patient transferred to CIR on 04/23/2013 .   Patient currently requires mod with mobility secondary to muscle weakness and muscle paralysis, impaired timing and sequencing and unbalanced muscle activation and decreased standing balance, decreased balance strategies and difficulty maintaining precautions. Once pt provided with a RW he is able to transfer and ambulate short distances with min assist however is heavily reliant on UEs. Pt needs improved endurance, strengthening, standing balance, and decreased reliance on UE for standing activities. Pt already has a Rt. AFO for foot drop (since January) however would like a custom fit brace which PT will address while here.  Prior to hospitalization for first back sugery, patient was independent  with mobility and lived with Spouse in a House home.  Home access is  Level entry.  Patient will benefit from skilled PT intervention to maximize safe functional mobility, minimize fall risk and decrease caregiver burden for planned discharge home with intermittent assist.  Anticipate patient will benefit from follow up [facility] at discharge.  PT - End of Session Endurance Deficit: Yes PT Assessment Rehab Potential: Good Barriers to Discharge: None PT Plan PT Intensity: Minimum of 1-2 x/day ,45 to 90 minutes PT Frequency: 5 out of 7 days PT Duration Estimated Length of Stay: 7-10 days PT Treatment/Interventions: Ambulation/gait training;Balance/vestibular training;Discharge planning;Community reintegration;DME/adaptive equipment instruction;Functional mobility training;Patient/family  education;Neuromuscular re-education;Psychosocial support;Splinting/orthotics;Therapeutic Exercise;Therapeutic Activities;Stair training;UE/LE Strength taining/ROM;Wheelchair propulsion/positioning PT Recommendation Follow Up Recommendations: Home health PT;Other (comment) (intermittant supervision) Equipment Recommended: Other (comment) (TBD)  Skilled Therapeutic Intervention During ambulation PT facilitated Lt pelvic rotation with Lt. LE advancement and into early stance on Lt. In addition to hip extension bilaterally. Cues for proximity of walker. Pt participated in strengthening of LEs with step up/downs to 4" step, able to perform on Rt. But unable to step up with Lt. LE due to weakness. Discussed purpose of rehab and goals that will be set for pt to help him reach his personal goals of improved strength and independence.   PT Evaluation Precautions/Restrictions Precautions Precautions: Back;Fall Precaution Comments: Pt able to recall 3/3 back precautions.  Required Braces or Orthoses: Other Brace/Splint Other Brace/Splint: no back brace; Rt AFO Restrictions Weight Bearing Restrictions: No Pain Pain Assessment Pain Assessment: No/denies pain Pain Score: 0-No pain Pain Type: Surgical pain Pain Location: Back Pain Orientation: Lower Pain Descriptors / Indicators: Aching Pain Onset: With Activity Pain Intervention(s): Repositioned;Rest (as needed) Home Living/Prior Functioning Home Living Lives With: Spouse Available Help at Discharge: Family Type of Home: House Home Access: Level entry Home Layout: One level Bathroom Shower/Tub: Health visitor: Handicapped height Bathroom Accessibility: Yes How Accessible: Accessible via wheelchair;Accessible via walker Home Adaptive Equipment: Built-in shower seat;Grab bars around toilet;Grab bars in shower;Hand-held shower hose;Walker - rolling Additional Comments: Pt and wife built a fully handicap accessible house.   Prior Function Level of Independence: Independent with basic ADLs;Other (comment) (  since Jan been getting around in office chair mostly) Able to Take Stairs?: Yes (but tried to avoid) Driving: Yes (yes but difficult) Vocation: Retired Leisure: Hobbies-yes (Comment) Comments: Put most efforts into job, likes to work hard. Vision/Perception  Vision - History Baseline Vision: No visual deficits Visual History: Cataracts (R eye) Vision - Assessment Eye Alignment: Within Functional Limits Perception Perception: Within Functional Limits Praxis Praxis: Intact  Cognition Overall Cognitive Status: Within Functional Limits for tasks assessed Arousal/Alertness: Awake/alert Orientation Level: Oriented X4 Attention: Focused Focused Attention: Appears intact Memory: Appears intact Awareness: Appears intact Problem Solving: Appears intact Sensation Sensation Light Touch: Impaired Detail Light Touch Impaired Details: Impaired RLE (but pt reports no difficulties) Stereognosis: Appears Intact Hot/Cold: Appears Intact Proprioception: Appears Intact Coordination Gross Motor Movements are Fluid and Coordinated: Yes Fine Motor Movements are Fluid and Coordinated: Yes  Mobility Bed Mobility Bed Mobility: Rolling Right Rolling Right: 4: Min assist (without rail) Rolling Right Details: Tactile cues for sequencing Rolling Right Details (indicate cue type and reason): Cues for log roll technique. Right Sidelying to Sit: 4: Min assist Right Sidelying to Sit Details (indicate cue type and reason): Difficulty maintaining precautions without railing, cues needed. Transfers Sit to Stand: 3: Mod assist;4: Min assist Sit to Stand Details (indicate cue type and reason): Cues for maintaining precautions, pt "crawls" up therapist using both hands to stand without device in front. Pt reliant on UEs to reach full standing. With RW in front pt able to come to standing with min assist. Stand to Sit: 4: Min  assist Stand Pivot Transfers: 3: Mod assist Stand Pivot Transfer Details (indicate cue type and reason): Verbal cues for sequencing, pt holding onto therapist with both hands for stability and support. Pt able to perform transfer with min assist with use of RW.  Locomotion  Ambulation Ambulation: Yes Ambulation/Gait Assistance: 4: Min assist Ambulation Distance (Feet): 60 Feet Assistive device: Rolling walker Ambulation/Gait Assistance Details: Heavily reliant on RW due to bil. LE weakness. Cues for upright posture as pt flexed at hips Lt>Rt, Some Lt. knee hyperextension more evident with fatigue.  Gait Gait: Yes Gait Pattern: Impaired Gait Pattern: Step-through pattern;Decreased hip/knee flexion - left;Decreased hip/knee flexion - right;Decreased stride length;Left genu recurvatum;Trunk flexed Gait velocity: decreased Stairs / Additional Locomotion Stairs: Yes Stairs Assistance: 3: Mod assist Stairs Assistance Details (indicate cue type and reason): Cues for sequencing (pt has to go up steps with rt. LE, down with Rt. LE due to Rt. AFO), decreased descending control.  Stair Management Technique: Two rails;Step to pattern;Forwards Number of Stairs: 2 Corporate treasurer: Yes Wheelchair Assistance: 4: Administrator, sports Details: Verbal cues for safe use of DME/AE;Verbal cues for Engineer, drilling: Both upper extremities Wheelchair Parts Management: Needs assistance Distance: 175   Balance Balance Balance Assessed: Yes Static Sitting Balance Static Sitting - Balance Support: Feet supported;Bilateral upper extremity supported Static Sitting - Level of Assistance: 5: Stand by assistance Static Standing Balance Static Standing - Balance Support: Bilateral upper extremity supported Static Standing - Level of Assistance: 5: Stand by assistance Extremity Assessment  RUE Assessment RUE Assessment:  (grossly 4/5; would benefit  from strengthening) LUE Assessment LUE Assessment: Within Functional Limits (grossly 4-/5; would benefit from strengthening) RLE Assessment RLE Assessment: Exceptions to [facility] RLE AROM (degrees) RLE Overall AROM Comments: Decreased dorsiflexion RLE Strength Right Hip Flexion: 3-/5 Right Knee Flexion: 3-/5 Right Knee Extension: 3/5 Right Ankle Dorsiflexion: 1/5 LLE Assessment LLE Assessment: Exceptions to [facility] LLE Strength Left Hip  Flexion: 3-/5 Left Knee Flexion: 3/5 Left Knee Extension: 3/5 Left Ankle Dorsiflexion: 3/5  FIM:  FIM - Bed/Chair Transfer Bed/Chair Transfer: 0: Activity did not occur FIM - Locomotion: Wheelchair Distance: 175 FIM - Locomotion: Ambulation Ambulation/Gait Assistance: 4: Min assist   Refer to Care Plan for Long Term Goals  Recommendations for other services: None  Discharge Criteria: Patient will be discharged from PT if patient refuses treatment 3 consecutive times without medical reason, if treatment goals not met, if there is a change in medical status, if patient makes no progress towards goals or if patient is discharged from hospital.  The above assessment, treatment plan, treatment alternatives and goals were discussed and mutually agreed upon: by patient  Wilhemina Bonito 04/24/2013, 12:19 PM

## 2013-04-24 NOTE — Progress Notes (Signed)
INITIAL NUTRITION ASSESSMENT  DOCUMENTATION CODES Per approved criteria  -Non-severe (moderate) malnutrition in the context of chronic illness   INTERVENTION: 1. Pt declined oral nutrition supplements. 2. Encouraged pt to eat foods brought in from family. 3. RD to continue to follow nutrition care plan.  NUTRITION DIAGNOSIS: Inadequate oral intake related to food preferences as evidenced by pt report.   Goal: Intake to meet >90% of estimated nutrition needs.  Monitor:  weight trends, lab trends, I/O's, PO intake  Reason for Assessment: Health History  67 y.o. male  Admitting Dx: HNP (herniated nucleus pulposus), lumbar  ASSESSMENT: PMHx significant for renal transplant (pt is s/p tx x 40 years - oldest living renal transplant recipient in state), ICD/pacemaker, CHF, DM.  S/p recent back surgery 2 weeks ago for L3 decompression discectomy. Admitted 5/26 with AMS, low-grade fever, RLE cellulitis and SOB. Pt was diuresed per renal. CT of knee showed joint effusion with edema, possibly 2/2 cellulitis. CT of spine showed air-fluid collection s/p recent surgery. Underwent redo microdisckectomy.  Pt with constipation and nausea. Reglan added 6/9. Pt does not want any scheduled bowel program.  Per chart review, usual weight appears to be approximately 185 lb, but pt is currently down to 157 lb. This is a weight change of 15% x approx 7 months. Pt confirms this weight loss, however he states that his weight loss is 2/2 fluid loss, stating "I lost 33 lb of fluid during that time." Does endorse poor oral intake while in hospital 2/2 food preferences but he notes that his wife brings him some foods.  Pt meets criteria for moderate MALNUTRITION in the context of chronic illness as evidenced by 15% wt loss x 7 months and intake of <75% of estimated energy needs x at least 1 month.  Pt declined oral nutrition supplements at this time.   Height: Ht Readings from Last 1 Encounters:  04/23/13 6'  1" (1.854 m)    Weight: Wt Readings from Last 1 Encounters:  04/24/13 157 lb 3 oz (71.3 kg)    Ideal Body Weight: 184 lb  % Ideal Body Weight: 85%  Wt Readings from Last 10 Encounters:  04/24/13 157 lb 3 oz (71.3 kg)  04/23/13 164 lb 7.4 oz (74.6 kg)  04/23/13 164 lb 7.4 oz (74.6 kg)  10/04/12 183 lb (83.008 kg)  08/23/12 175 lb (79.379 kg)  07/25/12 187 lb 2.7 oz (84.9 kg)  07/25/12 187 lb 2.7 oz (84.9 kg)  07/24/12 187 lb 1.6 oz (84.868 kg)    Usual Body Weight: 183-187 lb  % Usual Body Weight: 85%; 15% wt loss x 7 months  BMI:  Body mass index is 20.74 kg/(m^2). WNL  Estimated Nutritional Needs: Kcal: 1700 - 1900 Protein: 71 - 85 grams Fluid: 1.7 - 1.9 liters daily  Skin:  Edema to R foot Back incisions Skin tears  Diet Order: Carb Control Medium (1600 - 2000); 2 gram sodium diet  EDUCATION NEEDS: -No education needs identified at this time   Intake/Output Summary (Last 24 hours) at 04/24/13 1114 Last data filed at 04/24/13 0800  Gross per 24 hour  Intake    360 ml  Output    550 ml  Net   -190 ml    Last BM: 6/8  Labs:   Recent Labs Lab 04/21/13 0510 04/23/13 0445 04/24/13 0600  NA 136 137 137  K 4.3 3.5 3.9  CL 98 100 101  CO2 26 26 26   BUN 39* 39* 39*  CREATININE  2.68* 2.54* 2.45*  CALCIUM 8.8 8.6 8.4  PHOS  --  4.5 4.5  GLUCOSE 117* 135* 117*    CBG (last 3)   Recent Labs  04/23/13 1740 04/23/13 2105 04/24/13 0714  GLUCAP 194* 205* 113*    Scheduled Meds: . antiseptic oral rinse  15 mL Mouth Rinse BID  . aspirin EC  325 mg Oral Daily  . atorvastatin  40 mg Oral q1800  . carvedilol  12.5 mg Oral BID WC  . [START ON 04/26/2013] darbepoetin (ARANESP) injection - NON-DIALYSIS  100 mcg Subcutaneous Q Thu-1800  . enoxaparin (LOVENOX) injection  30 mg Subcutaneous Q24H  . famotidine  20 mg Oral Daily  . furosemide  80 mg Oral Daily  . hydrocerin   Topical BID  . insulin aspart  0-5 Units Subcutaneous QHS  . insulin aspart   0-9 Units Subcutaneous TID WC  . insulin detemir  10 Units Subcutaneous Daily  . metoCLOPramide  5 mg Oral BID  . mycophenolate  500 mg Oral BID  . omega-3 acid ethyl esters  1 g Oral Daily  . predniSONE  5 mg Oral BID  . pyridOXINE  200 mg Oral Daily  . sodium chloride  3 mL Intravenous Q12H  . vitamin B-12  250 mcg Oral Daily  . vitamin C  500 mg Oral Daily    Continuous Infusions:   Past Medical History  Diagnosis Date  . Myocardial infarction 04/12/1990  . CHF (congestive heart failure) 01/2012  . Hypertension   . Diabetes mellitus   . ICD (implantable cardiac defibrillator) in place   . Pacemaker 02/15/2012    [employer]  . Pneumonia 2011  . Arthritis   . History of blood transfusion   . Chronic kidney disease     Glomerulonephritis  . Dysrhythmia   . Cancer     Bladder Cancer- 2004 .  SkinCancer- Basal, Squamous, and a few melymona  . Right foot drop     Past Surgical History  Procedure Laterality Date  . Kidney transplant  02/13/1973  . Coronary angioplasty  03/1990  . Skin cancer excision      Numerous  . Eye surgery      Cataract Right Eye  . Insert / replace / remove pacemaker    . Uretheral transplation      Left to Right (transplanted kidney)  . Skin transplant      Left thigh to Left hand  . Knee arthroscopy  07/25/2012    Procedure: ARTHROSCOPY KNEE;  Surgeon: Cammy Copa, MD;  Location: [facility] OR;  Service: Orthopedics;  Laterality: Left;  Left knee arthroscopy, debridement, cultures  . Lumbar laminectomy/decompression microdiscectomy Bilateral 03/26/2013    Procedure: LUMBAR LAMINECTOMY/DECOMPRESSION MICRODISCECTOMY 1 LEVEL;  Surgeon: Reinaldo Meeker, MD;  Location: [facility];  Service: Neurosurgery;  Laterality: Bilateral;  . Lumbar laminectomy/decompression microdiscectomy N/A 04/19/2013    Procedure: Redo Lumbar Three to Four Decompression One LEVEL;  Surgeon: Cristi Loron, MD;  Location: [facility];  Service: Neurosurgery;  Laterality:  N/A;  Redo Lumbar Three to Four Decompression One LEVEL    Jarold Motto MS, RD, LDN Pager: [phone] After-hours pager: [phone]

## 2013-04-24 NOTE — Evaluation (Signed)
Evaluation note and session note reviewed and both accurately reflect assessment & plan and treatment session.    

## 2013-04-24 NOTE — Progress Notes (Signed)
Physical Therapy Session Note  Patient Details  Name: [name] MRN: [id number] Date of Birth: 09-03-1946  Today's Date: 04/24/2013 Time: 4098-1191 Time Calculation (min): 43 min  Short Term Goals: Week 1:  PT Short Term Goal 1 (Week 1): =long term goals  Skilled Therapeutic Interventions/Progress Updates:  This session focused on gait with RW with WC to follow for encouragement of increased gait distance.  Pt ambulated >250' total with 4 seated rest breaks.  Verbal cues needed for upright posture.  When fatigued pt reports his left knee goes back into hyperextension.  Pt also reports he wants a lower profile brace for his right foot for the summer months and that is why he wants to talk to [employer].  His current AFO is only 81-9 months old.     Therapy Documentation Precautions:  Precautions Precautions: Back;Fall Precaution Comments: left leg weaker than right leg.   Required Braces or Orthoses: Other Brace/Splint Other Brace/Splint: no back brace; Rt AFO Restrictions Weight Bearing Restrictions: No   Vital Signs: Therapy Vitals Temp: 98.3 F (36.8 C) Temp src: Oral Pulse Rate: 83 Resp: 17 BP: 153/91 mmHg Oxygen Therapy SpO2: 98 % O2 Device: None (Room air)    See FIM for current functional status  Therapy/Group: Individual Therapy  Lurena Joiner B. Taber Sweetser, PT, DPT [phone]   04/24/2013, 4:09 PM

## 2013-04-24 NOTE — Evaluation (Signed)
Occupational Therapy Assessment and Plan & Session Note  Patient Details  Name: [name] MRN: [id number] Date of Birth: 1946/07/20  OT Diagnosis: acute pain and muscle weakness (generalized) Rehab Potential: Rehab Potential: Good ELOS: 7-10 days   Today's Date: 04/24/2013  Problem List:  Patient Active Problem List   Diagnosis Date Noted  . HNP (herniated nucleus pulposus), lumbar--redo microdisectomy 04/19/13 04/24/2013  . S/P laminectomy 04/22/2013  . Positive D dimer 04/10/2013  . Dehydration 04/10/2013  . Acute respiratory failure with hypoxia 04/10/2013  . Hx of decompressive lumbar laminectomy (L-3) [id number], 2014 04/10/2013  . Pericardial effusion 04/10/2013  . SIRS (systemic inflammatory response syndrome) 04/09/2013  . Demand ischemia 04/09/2013  . Diabetes mellitus type 2, controlled 04/09/2013  . Cellulitis of right lower extremity 04/09/2013  . CAD (coronary artery disease) 04/09/2013  . Renal failure (ARF), acute on chronic kidney disease stage 3 04/09/2013  . HTN (hypertension) 10/04/2012  . Septic arthritis of knee, left 08/23/2012  . History of renal transplant 08/23/2012    Past Medical History:  Past Medical History  Diagnosis Date  . Myocardial infarction 04/12/1990  . CHF (congestive heart failure) 01/2012  . Hypertension   . Diabetes mellitus   . ICD (implantable cardiac defibrillator) in place   . Pacemaker 02/15/2012    [employer]  . Pneumonia 2011  . Arthritis   . History of blood transfusion   . Chronic kidney disease     Glomerulonephritis  . Dysrhythmia   . Cancer     Bladder Cancer- 2004 .  SkinCancer- Basal, Squamous, and a few melymona  . Right foot drop    Past Surgical History:  Past Surgical History  Procedure Laterality Date  . Kidney transplant  02/13/1973  . Coronary angioplasty  03/1990  . Skin cancer excision      Numerous  . Eye surgery      Cataract Right Eye  . Insert / replace / remove pacemaker    . Uretheral  transplation      Left to Right (transplanted kidney)  . Skin transplant      Left thigh to Left hand  . Knee arthroscopy  07/25/2012    Procedure: ARTHROSCOPY KNEE;  Surgeon: Cammy Copa, MD;  Location: [facility] OR;  Service: Orthopedics;  Laterality: Left;  Left knee arthroscopy, debridement, cultures  . Lumbar laminectomy/decompression microdiscectomy Bilateral 03/26/2013    Procedure: LUMBAR LAMINECTOMY/DECOMPRESSION MICRODISCECTOMY 1 LEVEL;  Surgeon: Reinaldo Meeker, MD;  Location: [facility] OR[facility];  Service: Neurosurgery;  Laterality: Bilateral;  . Lumbar laminectomy/decompression microdiscectomy N/A 04/19/2013    Procedure: Redo Lumbar Three to Four Decompression One LEVEL;  Surgeon: Cristi Loron, MD;  Location: [facility];  Service: Neurosurgery;  Laterality: N/A;  Redo Lumbar Three to Four Decompression One LEVEL    Assessment & Plan Clinical Impression: [name] is a 67 y.o. right-handed male with history of renal transplant, ICD/pacemaker, systolic congestive heart failure, diabetes mellitus with peripheral neuropathy and chronic right foot drop. Patient with recent low back surgery 2 weeks ago for L3 decompression discectomy brought to the emergency room 04/09/2013 with altered mental status, mild shortness of breath. Patient noted to have low-grade fever with evidence of RLE cellulitis. He was started on IV antibiotics for SIRS. V/Q scan with low suspicion of PE. EKG paced rhythm with ST elevation in the inferior leads and 2D echo with evidence of large pleural effusion. LHC was consulted and felt ST changes non-specific and patient with  evidence of volume overload without failure. He was started on colchicine and repeat echo Nephrology consulted for input on renal failure and recommended increasing diuretics to help with edema as well as effusion. Renal U/S without hydronephrosis. Was started on IV iron with aranesp for anemia of chronic disease. CT of right knee showed no evidence of  osteomyelitis there is a small joint effusion with subcutaneous edema diffusely about the knee likely due to possible cellulitis. Venous Doppler studies lower extremity showed no signs of DVT. CT lumbar spine showed interval postsurgical changes lumbar L3-4. An epidural air-fluid collection extending inferiorly from the disc space behind the L4 vertebral body and exerts mass effect on the thecal sac. Films evaluated by Dr. Gerlene Fee who felt that xray's with post op changes and doubtedt infection. He did develop diarrhea with worsening of renal status therefore colchicine discontinued and diuretics decreased back to lower dosage. Repeat echo 06/03 with slight decrease in size of effusion. 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 Lovell Sheehan. Back and hip pain improved. Patient transferred to CIR on 04/23/2013 .    Patient currently requires min-max assist with basic self-care skills secondary to muscle weakness, decreased cardiorespiratoy endurance and decreased standing balance and decreased postural control.  Prior to hospitalization, patient could complete ADL's with independent .  Patient will benefit from skilled intervention to increase independence with basic self-care skills prior to discharge home with care partner.  Anticipate patient will require intermittent supervision and no further OT follow recommended at this time.  OT - End of Session Activity Tolerance: Tolerates 10 - 20 min activity with multiple rests Endurance Deficit: Yes OT Assessment Rehab Potential: Good Barriers to Discharge: None OT Plan OT Intensity: Minimum of 1-2 x/day, 45 to 90 minutes OT Frequency: 5 out of 7 days OT Duration/Estimated Length of Stay: 7-10 days OT Treatment/Interventions: Balance/vestibular training;Community reintegration;Discharge planning;Disease mangement/prevention;DME/adaptive equipment instruction;Functional mobility  training;Neuromuscular re-education;Pain management;Patient/family education;Psychosocial support;Self Care/advanced ADL retraining;Skin care/wound managment;Splinting/orthotics;Therapeutic Activities;Therapeutic Exercise;UE/LE Strength taining/ROM;UE/LE Coordination activities;Wheelchair propulsion/positioning OT Recommendation Patient destination: Home Follow Up Recommendations: None Equipment Recommended:  (TBD)  Precautions/Restrictions  Precautions Precautions: Back;Fall Precaution Comments: Pt able to recall 3/3 back precautions.  Required Braces or Orthoses: Other Brace/Splint Other Brace/Splint: no back brace; Rt AFO Restrictions Weight Bearing Restrictions: No  General Chart Reviewed: Yes Family/Caregiver Present: No  Pain Pain Assessment Pain Assessment: No/denies pain Pain Score: 0-No pain Pain Type: Surgical pain Pain Location: Back Pain Orientation: Lower Pain Descriptors / Indicators: Aching Pain Onset: With Activity Pain Intervention(s): Repositioned;Rest (as needed)  Home Living/Prior Functioning Home Living Lives With: Spouse Available Help at Discharge: Family Type of Home: House Home Access: Level entry Home Layout: One level Bathroom Shower/Tub: Health visitor: Handicapped height Bathroom Accessibility: Yes How Accessible: Accessible via wheelchair;Accessible via walker Home Adaptive Equipment: Built-in shower seat;Grab bars around toilet;Grab bars in shower;Hand-held shower hose;Walker - rolling Additional Comments: Pt and wife built a fully handicap accessible house.  IADL History Homemaking Responsibilities: Yes Meal Prep Responsibility: Secondary Laundry Responsibility: Secondary Cleaning Responsibility: Secondary Bill Paying/Finance Responsibility: Secondary Shopping Responsibility: Secondary Child Care Responsibility: No Current License: Yes Mode of Transportation: Car Occupation: Retired Type of Occupation: Customer service manager  Leisure and Hobbies: Travel,  Prior Function Level of Independence: Independent with basic ADLs;Other (comment) (since Jan been getting around in office chair mostly) Able to Take Stairs?: Yes (but tried to avoid) Driving: Yes (yes but difficult) Vocation: Retired Leisure:  Hobbies-yes (Comment) Comments: Put most efforts into job, likes to work hard.  ADL: See FIM  Vision/Perception  Vision - History Baseline Vision: No visual deficits Visual History: Cataracts (R eye) Vision - Assessment Eye Alignment: Within Functional Limits Perception Perception: Within Functional Limits Praxis Praxis: Intact   Cognition Overall Cognitive Status: Within Functional Limits for tasks assessed Arousal/Alertness: Awake/alert Orientation Level: Oriented X4 Attention: Focused Focused Attention: Appears intact Memory: Appears intact Awareness: Appears intact Problem Solving: Appears intact  Sensation Sensation Light Touch: Impaired Detail Light Touch Impaired Details: Impaired RLE (but pt reports no difficulties) Stereognosis: Appears Intact Hot/Cold: Appears Intact Proprioception: Appears Intact Coordination Gross Motor Movements are Fluid and Coordinated: Yes Fine Motor Movements are Fluid and Coordinated: Yes  Extremity/Trunk Assessment RUE Assessment RUE Assessment:  (grossly 4/5; would benefit from strengthening) LUE Assessment LUE Assessment: Within Functional Limits (grossly 4-/5; would benefit from strengthening)  FIM:  FIM - Eating Eating Activity: 0: Activity did not occur FIM - Grooming Grooming Steps: Oral care, brush teeth, clean dentures;Shave or apply make-up Grooming: 5: Set-up assist to open containers FIM - Bathing Bathing Steps Patient Completed: Chest;Right Arm;Left Arm;Abdomen Bathing: 2: Max-Patient completes 3-4 25f 10 parts or 25-49% FIM - Upper Body Dressing/Undressing Upper body dressing/undressing: 0: Wears gown/pajamas-no public clothing FIM  - Lower Body Dressing/Undressing Lower body dressing/undressing: 0: Activity did not occur FIM - Toileting Toileting: 0: Activity did not occur FIM - Bed/Chair Transfer Bed/Chair Transfer: 0: Activity did not occur FIM - Diplomatic Services operational officer Devices: Bedside commode;Elevated toilet seat;Walker Toilet Transfers: 4-To toilet/BSC: Min A (steadying Pt. > 75%);4-From toilet/BSC: Min A (steadying Pt. > 75%) FIM - Tub/Shower Transfers Tub/shower Transfers: 0-Activity did not occur or was simulated   Refer to Care Plan for Long Term Goals  Recommendations for other services: None at this time.  Discharge Criteria: Patient will be discharged from OT if patient refuses treatment 3 consecutive times without medical reason, if treatment goals not met, if there is a change in medical status, if patient makes no progress towards goals or if patient is discharged from hospital.  The above assessment, treatment plan, treatment alternatives and goals were discussed and mutually agreed upon: by patient ---------------------------------------------------------------------------------------------------------------------- Session Note: Time: 1030-1130 (60 mins) Pt with no report of pain.  Initial 1-on-1 OT eval complete. At beginning of tx session, pt's HR=82. Pt completed UB bathing/dressing and grooming tasks in w/c @ sink. Next, pt ambulated->BR using R/W and completed toilet transfer->BSC over raised toilet seat. Pt ambulated->room using R/W and transferred W/C->recliner. Skilled intervention also focused on FM skills/coordination and fxal use of BUE's. Pt able to recall 3/3 back precautions. At end of tx session, pt seated in recliner with call bell and phone within reach.  Aprill Banko 04/24/2013, 12:01 PM

## 2013-04-24 NOTE — Progress Notes (Signed)
Patient ID: [name], male   DOB: 1946/01/14, 67 y.o.   MRN: [id number] Subjective/Complaints: [name] is a 67 y.o. right-handed male with history of renal transplant, ICD/pacemaker, systolic congestive heart failure, diabetes mellitus with peripheral neuropathy and chronic right foot drop. Patient with recent low back surgery 2 weeks ago for L3 decompression discectomy brought to the emergency room 04/09/2013 with altered mental status, mild shortness of breath. Patient noted to have low-grade fever with evidence of RLE cellulitis. He was started on IV antibiotics for SIRS. V/Q scan with low suspicion of PE. EKG paced rhythm with ST elevation in the inferior leads and 2D echo with evidence of large pleural effusion. LHC was consulted and felt ST changes non-specific and patient with evidence of volume overload without failure. He was started on colchicine and repeat echo Nephrology consulted for input on renal failure and recommended increasing diuretics to help with edema as well as effusion. Renal U/S without hydronephrosis. Was started on IV iron with aranesp for anemia of chronic disease.  CT of right knee showed no evidence of osteomyelitis there is a small joint effusion with subcutaneous edema diffusely about the knee likely due to possible cellulitis. Venous Doppler studies lower extremity showed no signs of DVT. CT lumbar spine showed interval postsurgical changes lumbar L3-4. An epidural air-fluid collection extending inferiorly from the disc space behind the L4 vertebral body and exerts mass effect on the thecal sac. Films evaluated by Dr. Gerlene Fee who felt that xray's with post op changes and doubtedt infection. He did develop diarrhea with worsening of renal status therefore colchicine discontinued and diuretics decreased back to lower dosage. Repeat echo 06/03 with slight decrease in size of effusion. 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 Lovell Sheehan. Back and hip pain improved  Pt reports  Baseline Creat 2.4  Review of Systems  Gastrointestinal: Positive for constipation.  All other systems reviewed and are negative.    Objective: Vital Signs: Blood pressure 167/91, pulse 78, temperature 98.3 F (36.8 C), temperature source Oral, resp. rate 20, height 6\' 1"  (1.854 m), weight 71.3 kg (157 lb 3 oz), SpO2 99.00%. No results found. Results for orders placed during the hospital encounter of 04/23/13 (from the past 72 hour(s))  GLUCOSE, CAPILLARY     Status: Abnormal   Collection Time    04/23/13  5:40 PM      Result Value Range   Glucose-Capillary 194 (*) 70 - 99 mg/dL   Comment 1 Notify RN    GLUCOSE, CAPILLARY     Status: Abnormal   Collection Time    04/23/13  9:05 PM      Result Value Range   Glucose-Capillary 205 (*) 70 - 99 mg/dL   Comment 1 Notify RN    CBC WITH DIFFERENTIAL     Status: Abnormal   Collection Time    04/24/13  6:00 AM      Result Value Range   WBC 7.0  4.0 - 10.5 K/uL   RBC 3.34 (*) 4.22 - 5.81 MIL/uL   Hemoglobin 9.9 (*) 13.0 - 17.0 g/dL   HCT 40.9 (*) 81.1 - 91.4 %   MCV 95.2  78.0 - 100.0 fL   MCH 29.6  26.0 - 34.0 pg   MCHC 31.1  30.0 - 36.0 g/dL   RDW 78.2 (*) 95.6 - 21.3 %   Platelets 152  150 - 400 K/uL   Neutrophils Relative %  79 (*) 43 - 77 %   Neutro Abs 5.5  1.7 - 7.7 K/uL   Lymphocytes Relative 5 (*) 12 - 46 %   Lymphs Abs 0.4 (*) 0.7 - 4.0 K/uL   Monocytes Relative 15 (*) 3 - 12 %   Monocytes Absolute 1.0  0.1 - 1.0 K/uL   Eosinophils Relative 1  0 - 5 %   Eosinophils Absolute 0.1  0.0 - 0.7 K/uL   Basophils Relative 0  0 - 1 %   Basophils Absolute 0.0  0.0 - 0.1 K/uL  RENAL FUNCTION PANEL     Status: Abnormal   Collection Time    04/24/13  6:00 AM      Result Value Range   Sodium 137  135 - 145 mEq/L   Potassium 3.9  3.5 - 5.1 mEq/L   Chloride 101  96 - 112 mEq/L   CO2 26  19 - 32 mEq/L   Glucose, Bld 117 (*) 70 - 99 mg/dL   BUN 39 (*) 6 -  23 mg/dL   Creatinine, Ser 4.09 (*) 0.50 - 1.35 mg/dL   Calcium 8.4  8.4 - 81.1 mg/dL   Phosphorus 4.5  2.3 - 4.6 mg/dL   Albumin 2.6 (*) 3.5 - 5.2 g/dL   GFR calc non Af Amer 26 (*) >90 mL/min   GFR calc Af Amer 30 (*) >90 mL/min   Comment:            The eGFR has been calculated     using the CKD EPI equation.     This calculation has not been     validated in all clinical     situations.     eGFR's persistently     <90 mL/min signify     possible Chronic Kidney Disease.  GLUCOSE, CAPILLARY     Status: Abnormal   Collection Time    04/24/13  7:14 AM      Result Value Range   Glucose-Capillary 113 (*) 70 - 99 mg/dL   Comment 1 Notify RN      Nursing note and vitals reviewed.  Constitutional: He is oriented to person, place, and time. He appears well-developed and well-nourished.  HENT:  Head: Normocephalic and atraumatic.  Eyes: Pupils are equal, round, and reactive to light.  Cardiovascular: Normal rate and regular rhythm.  Pulmonary/Chest: Effort normal.  Abdominal: Soft. Bowel sounds are normal.  Musculoskeletal: He exhibits edema (1+ edema right foot).  Neurological: He is alert and oriented to person, place, and time.  Skin: Skin is warm. Rash (Multiple skin lesions with eccymotic areas exposed areas as well as scars from healed abrasions. ) noted. Multiple  Seborrheic  keratoses Back incision with OpSite in place.  Upper body strength 4/5 in bilateral deltoid, biceps, triceps, grip  Right lower extremity 4 minus hip flexor knee extensor 1/5 in right ankle dorsiflexor  Left lower extremity 3 minus hip flexor knee extensor and ankle dorsiflexor plantar flexor  Sensory mildly reduced in both feet     Assessment/Plan: 1. 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 Physiatrist is providing close team supervision and 24 hour  management of active medical problems listed below. Physiatrist and rehab team continue to assess barriers to discharge/monitor patient progress toward functional and medical goals. FIM:  Comprehension Comprehension Mode: Auditory Comprehension: 5-Understands basic 90% of the time/requires cueing < 10% of the time  Expression Expression Mode: Verbal Expression: 5-Expresses basic needs/ideas: With extra time/assistive device  Social Interaction Social Interaction: 6-Interacts appropriately with others with medication or extra time (anti-anxiety, antidepressant).  Problem Solving Problem Solving: 4-Solves basic 75 - 89% of the time/requires cueing 10 - 24% of the time  Memory Memory: 5-Recognizes or recalls 90% of the time/requires cueing < 10% of the time  Medical Problem List and Plan:  1. DVT Prophylaxis/Anticoagulation: Pharmaceutical: Lovenox  2. Pain Management: prn oxycodone effective.  3. Mood: Motivated. Is feeling better past second surgery. Will have LCSW follow for evaluation.  4. Neuropsych: This patient is capable of making decisions on his/her own behalf.  5. 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 Renal status improving with decrease in lasix and resolution of diarrhea.  6. Dm type 2: Continue levemir  7. Large pericardial effusion without tamponade: For 2D echo in several weeks past discharge.  8. LE cellulitis: resolved. Diarrhea improving off antibiotics. C-diff check negative. Will continue to monitor.  9. 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 Fluid overload improved. Continue coreg, lasix 80 mg/day,  10. HTN: will monitor with bid checks. Continue lasix and coreg.  11. Anemia of chronic disease: treated with IV iron and on aranesp  12. Constipation with nausea: Reglan added yesterday--will discontinue as symptoms resolved. He would prefer meds discontinued and does not want any scheduled bowel program for now.       LOS (Days) 1 A FACE TO FACE EVALUATION WAS PERFORMED  KIRSTEINS,ANDREW E 04/24/2013, 8:06 AM

## 2013-04-24 NOTE — Progress Notes (Signed)
Social Work  Social Work Assessment and Plan  Patient Details  Name: [name] MRN: [id number] Date of Birth: 1946/08/26  Today's Date: 04/24/2013  Problem List:  Patient Active Problem List   Diagnosis Date Noted  . HNP (herniated nucleus pulposus), lumbar--redo microdisectomy 04/19/13 04/24/2013  . S/P laminectomy 04/22/2013  . Positive D dimer 04/10/2013  . Dehydration 04/10/2013  . Acute respiratory failure with hypoxia 04/10/2013  . Hx of decompressive lumbar laminectomy (L-3) [id number], 2014 04/10/2013  . Pericardial effusion 04/10/2013  . SIRS (systemic inflammatory response syndrome) 04/09/2013  . Demand ischemia 04/09/2013  . Diabetes mellitus type 2, controlled 04/09/2013  . Cellulitis of right lower extremity 04/09/2013  . CAD (coronary artery disease) 04/09/2013  . Renal failure (ARF), acute on chronic kidney disease stage 3 04/09/2013  . HTN (hypertension) 10/04/2012  . Septic arthritis of knee, left 08/23/2012  . History of renal transplant 08/23/2012   Past Medical History:  Past Medical History  Diagnosis Date  . Myocardial infarction 04/12/1990  . CHF (congestive heart failure) 01/2012  . Hypertension   . Diabetes mellitus   . ICD (implantable cardiac defibrillator) in place   . Pacemaker 02/15/2012    [employer]  . Pneumonia 2011  . Arthritis   . History of blood transfusion   . Chronic kidney disease     Glomerulonephritis  . Dysrhythmia   . Cancer     Bladder Cancer- 2004 .  SkinCancer- Basal, Squamous, and a few melymona  . Right foot drop    Past Surgical History:  Past Surgical History  Procedure Laterality Date  . Kidney transplant  02/13/1973  . Coronary angioplasty  03/1990  . Skin cancer excision      Numerous  . Eye surgery      Cataract Right Eye  . Insert / replace / remove pacemaker    . Uretheral transplation      Left to Right (transplanted kidney)  . Skin transplant      Left thigh to Left hand  . Knee arthroscopy   07/25/2012    Procedure: ARTHROSCOPY KNEE;  Surgeon: Cammy Copa, MD;  Location: [facility];  Service: Orthopedics;  Laterality: Left;  Left knee arthroscopy, debridement, cultures  . Lumbar laminectomy/decompression microdiscectomy Bilateral 03/26/2013    Procedure: LUMBAR LAMINECTOMY/DECOMPRESSION MICRODISCECTOMY 1 LEVEL;  Surgeon: Reinaldo Meeker, MD;  Location: [facility]UR[facility] ORS;  Service: Neurosurgery;  Laterality: Bilateral;  . Lumbar laminectomy/decompression microdiscectomy N/A 04/19/2013    Procedure: Redo Lumbar Three to Four Decompression One LEVEL;  Surgeon: Cristi Loron, MD;  Location: [facility] OR[facility];  Service: Neurosurgery;  Laterality: N/A;  Redo Lumbar Three to Four Decompression One LEVEL   Social History:  reports that he quit smoking about 30 years ago. He does not have any smokeless tobacco history on file. He reports that he does not drink alcohol or use illicit drugs.  Family / Support Systems Marital Status: Married How Long?: 25 yrs Patient Roles: Spouse Spouse/Significant Other: Andriy Sherk @ (H) [phone] or (C) [phone] Children: they have 3 adult children - all living in [place], however, not local to pt Anticipated Caregiver: wife Ability/Limitations of Caregiver: supervision level Caregiver Availability: 24/7 Family Dynamics: pt speaks very affectionately about his wife and how much support she offers to him - admits "I spoil her because that's what I'm supposed to do."  Social History Preferred language: English Religion: Baptist Cultural Background: NA Education: HS - 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  Does, however, spend beginning of interview expressing his frustration with first surgery and surgeon.  Denies any s/s of depression or anxiety.  Pleased with current functional status following "repair" surgery.   Recent Psychosocial Issues: None Pyschiatric History: None Substance Abuse History: None  Patient / Family Perceptions, Expectations & Goals Pt/Family understanding of illness & functional limitations: Pt able to provide good report of surgeries performed and description of current functional limitations. Premorbid pt/family roles/activities: Pt notes he has been fairly limited over past several weeks - Pt had been working full-time but now plans to "enjoy retirement" Anticipated changes in roles/activities/participation: Pt very hopeful that he will be much more independent with this surgery and CIR stay.  Does not want wife to be a caregiver for him. Pt/family expectations/goals: "I want to stay as long as needed to get as independent as I can."  [employer]: None Premorbid Home Care/DME Agencies: Other (Comment) (HH and OPPT after first surgery) Transportation available at discharge: yes  Discharge Planning Living Arrangements: Spouse/significant other Support Systems: Spouse/significant other;Children;Friends/neighbors Type of Residence: Private residence Insurance Resources: Media planner (specify) Teacher, music) Financial Resources: Employment;Social Security Financial Screen Referred: No Living Expenses: Own Money Management: Patient Do you have any problems obtaining your medications?: No Home Management: pt proudly reports he has a housekeeper "for my wife" Patient/Family Preliminary Plans: Home with wife who can assist if necessary, however, pt very reluctant to have her have to provide  assist Social Work Anticipated Follow Up Needs: HH/OP Expected length of stay: ELOS 5 to 7 days  Clinical Impression Pleasant gentleman who is now pleased with progress since this 2nd back surgery.  Frustrated with lengthy hospitalization and eager to get home, however, does not want to burden wife.  Follow for d/c planning and support.  Keithan Dileonardo 04/24/2013, 2:04 PM

## 2013-04-24 NOTE — Progress Notes (Signed)
Inpatient Rehabilitation Center Individual Statement of Services  Patient Name:  [name]  Date:  04/24/2013  Welcome to the Inpatient Rehabilitation Center.  Our goal is to provide you with an individualized program based on your diagnosis and situation, designed to meet your specific needs.  With this comprehensive rehabilitation program, you will be expected to participate in at least 3 hours of rehabilitation therapies Monday-Friday, with modified therapy programming on the weekends.  Your rehabilitation program will include the following services:  Physical Therapy (PT), Occupational Therapy (OT), 24 hour per day rehabilitation nursing, Therapeutic Recreaction (TR), Case Management (Social Worker), Rehabilitation Medicine, Nutrition Services and Pharmacy Services  Weekly team conferences will be held on Tuesdays to discuss your progress.  Your Social Worker will talk with you frequently to get your input and to update you on team discussions.  Team conferences with you and your family in attendance may also be held.  Expected length of stay: 2 weeks  Overall anticipated outcome: modified independent  Depending on your progress and recovery, your program may change. Your Social Worker will coordinate services and will keep you informed of any changes. Your Social Worker's name and contact numbers are listed  below.  The following services may also be recommended but are not provided by the Inpatient Rehabilitation Center:   Driving Evaluations  Home Health Rehabiltiation Services  Outpatient Rehabilitatation [facility]  Vocational Rehabilitation   Arrangements will be made to provide these services after discharge if needed.  Arrangements include referral to agencies that provide these services.  Your insurance has been verified to be:  [employer] Your primary doctor is:  Dr. Nedra Hai  Pertinent information will be shared with your doctor and your insurance company.  Social Worker:   [place], [place] [phone] or ([phone]  Information discussed with and copy given to patient by: Amada Jupiter, 04/24/2013, 6:19 PM

## 2013-04-25 ENCOUNTER — Inpatient Hospital Stay (HOSPITAL_COMMUNITY): Payer: PRIVATE HEALTH INSURANCE

## 2013-04-25 ENCOUNTER — Inpatient Hospital Stay (HOSPITAL_COMMUNITY): Payer: PRIVATE HEALTH INSURANCE | Admitting: Physical Therapy

## 2013-04-25 ENCOUNTER — Inpatient Hospital Stay (HOSPITAL_COMMUNITY): Payer: PRIVATE HEALTH INSURANCE | Admitting: *Deleted

## 2013-04-25 DIAGNOSIS — R5381 Other malaise: Secondary | ICD-10-CM

## 2013-04-25 LAB — GLUCOSE, CAPILLARY
Glucose-Capillary: 162 mg/dL — ABNORMAL HIGH (ref 70–99)
Glucose-Capillary: 113 mg/dL — ABNORMAL HIGH (ref 70–99)
Glucose-Capillary: 127 mg/dL — ABNORMAL HIGH (ref 70–99)

## 2013-04-25 LAB — RENAL FUNCTION PANEL
Albumin: 2.7 g/dL — ABNORMAL LOW (ref 3.5–5.2)
BUN: 44 mg/dL — ABNORMAL HIGH (ref 6–23)
Creatinine, Ser: 2.44 mg/dL — ABNORMAL HIGH (ref 0.50–1.35)
GFR calc non Af Amer: 26 mL/min — ABNORMAL LOW (ref >90)
Phosphorus: 4.5 mg/dL (ref 2.3–4.6)
Potassium: 4.1 mEq/L (ref 3.5–5.1)

## 2013-04-25 MED ORDER — SENNOSIDES-DOCUSATE SODIUM 8.6-50 MG PO TABS
2.0000 | ORAL_TABLET | Freq: Every day | ORAL | Status: DC
  Administered 2013-04-25 – 2013-05-03 (×9): 2 via ORAL
  Filled 2013-04-25 (×9): qty 2

## 2013-04-25 NOTE — Progress Notes (Signed)
Physical Therapy Session Note  Patient Details  Name: [name] MRN: [id number] Date of Birth: [birth year]  Today's Date: 04/25/2013 Time: 0832-0930 Time Calculation (min): 58 min  Short Term Goals: Week 1:  PT Short Term Goal 1 (Week 1): =long term goals  Skilled Therapeutic Interventions/Progress Updates:    Patient received supine in bed with HOB elevated. Session focused on gait training, sit<>stand transfers, and dynamic standing balance without UE support; see details below. Patient returned to room and left seated in recliner with all needs within reach.   Therapy Documentation Precautions:  Precautions Precautions: Back;Fall Precaution Comments: left leg weaker than right leg.   Required Braces or Orthoses: Other Brace/Splint Other Brace/Splint: no back brace; Rt AFO Restrictions Weight Bearing Restrictions: No Pain: Pain Assessment Pain Assessment: No/denies pain Pain Score: 0-No pain Locomotion : Ambulation Ambulation: Yes Ambulation/Gait Assistance: 4: Min guard Ambulation Distance (Feet): 75 Feet Assistive device: Rolling walker Ambulation/Gait Assistance Details: Tactile cues for posture;Verbal cues for gait pattern;Verbal cues for precautions/safety Ambulation/Gait Assistance Details: Patient with R AFO donned for whole session. Patient instructed in gait training 75'x1 in controlled environment with min guard. Patient significantly relies on B UE on RW. Patient requires verbal and tactile cues for upright posture. Gait Gait: Yes Gait Pattern: Impaired Gait Pattern: Step-through pattern;Decreased hip/knee flexion - left;Decreased hip/knee flexion - right;Decreased stride length;Trunk flexed Wheelchair Mobility Wheelchair Mobility: Yes Wheelchair Assistance: 5: Financial planner Details: Verbal cues for safe use of DME/AE;Verbal cues for Engineer, drilling: Both upper extremities Wheelchair Parts Management: Needs  assistance Distance: 150  Balance: Balance Balance Assessed: Yes Dynamic Standing Balance Dynamic Standing - Balance Support: No upper extremity supported Dynamic Standing - Level of Assistance: 4: Min assist Dynamic Standing - Balance Activities: Lateral lean/weight shifting;Forward lean/weight shifting;Reaching for objects;Reaching for weighted objects Dynamic Standing - Comments: Patient performed 3 rounds of reaching for x8 horseshoes and placing on elevated basketball hoop without UE support. Patient requires min assist for activity. Patient very hesitant to reach outside of BOS. Other Treatments: Treatments Therapeutic Activity: Patient performed 2 sets x5 reps sit<>stand transfers from wheelchair with min guard to supervision. Patient significantly relies on B UEs for sit>stand by pushing up with B UEs and then "walking B LEs up" to stand. Emphasis on anteior weight shift and increasing weight bearing through B LEs during sit>stand.  See FIM for current functional status  Therapy/Group: Individual Therapy  Chipper Herb. Shade Rivenbark, PT, DPT  04/25/2013, 10:47 AM

## 2013-04-25 NOTE — Patient Care Conference (Signed)
Inpatient RehabilitationTeam Conference and Plan of Care Update Date: 04/24/2013   Time: 2:10 PM    Patient Name: [name]      Medical Record Number: [id number]  Date of Birth: June 09, 1946 Sex: Male         Room/Bed: 4036/4036-01 Payor Info: Payor: MEDCOST / Plan: MEDCOST / Product Type: *No Product type* /    Admitting Diagnosis: redo lam/disectomy  Admit Date/Time:  04/23/2013  4:45 PM Admission Comments: No comment available   Primary Diagnosis:  HNP (herniated nucleus pulposus), lumbar Principal Problem: HNP (herniated nucleus pulposus), lumbar  Patient Active Problem List   Diagnosis Date Noted  . HNP (herniated nucleus pulposus), lumbar--redo microdisectomy 04/19/13 04/24/2013  . S/P laminectomy 04/22/2013  . Positive D dimer 04/10/2013  . Dehydration 04/10/2013  . Acute respiratory failure with hypoxia 04/10/2013  . Hx of decompressive lumbar laminectomy (L-3) [id number], 2014 04/10/2013  . Pericardial effusion 04/10/2013  . SIRS (systemic inflammatory response syndrome) 04/09/2013  . Demand ischemia 04/09/2013  . Diabetes mellitus type 2, controlled 04/09/2013  . Cellulitis of right lower extremity 04/09/2013  . CAD (coronary artery disease) 04/09/2013  . Renal failure (ARF), acute on chronic kidney disease stage 3 04/09/2013  . HTN (hypertension) 10/04/2012  . Septic arthritis of knee, left 08/23/2012  . History of renal transplant 08/23/2012    Expected Discharge Date: Expected Discharge Date: 05/04/13  Team Members Present: Physician leading conference: Dr. 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, severe neuropathy  Orthotic management, adaptive equipment  initiate therapy   Bowel/Bladder   continent bowel and bladder lbm 6-9  mod I  bm every other day    Swallow/Nutrition/ Hydration             ADL's   min-max assist  Overall Mod I  fxal endurance, fxal mobility, UE strengthening, BADL's, AE   Mobility   min-mod assist  mod I/Supervision  Strengthening of LEs, balance, endurance, transfers, maintaining precautions   Communication             Safety/Cognition/ Behavioral Observations  n/a         Pain   oxycodone 10 mg po every 4 hours prn back and left leg pain   less than 2   monitor effectiveness of pain meds   Skin   back incision healed few steri intact multiple dry skin lesions throughout body eucerin cream applied   min assist   educate patient and family on skinand wound care     Rehab Goals Patient on target to meet rehab goals: Yes *See Care Plan and progress notes for long and short-term goals.  Barriers to Discharge: see above    Possible Resolutions to Barriers:  see above    Discharge Planning/Teaching Needs:  new eval - plans to d/c home with wife who is very capable of providing any needed assistance.      Team Discussion:  New eval - minimal assistance with rw.  May ned custom AFO on R LE - PT will contact [employer].  Addressing UE strengthening.    Revisions to Treatment Plan:  None   Continued Need for  Acute Rehabilitation Level of Care: The patient requires daily medical management by a physician with specialized training in physical medicine and rehabilitation for the following conditions: Daily direction of a multidisciplinary physical rehabilitation program to ensure safe treatment while eliciting the highest outcome that is of practical value to the patient.: Yes Daily medical management of patient stability for increased activity during participation in an intensive rehabilitation regime.: Yes Daily analysis of  laboratory values and/or radiology reports with any subsequent need for medication adjustment of medical intervention for : Neurological problems;Post surgical problems  [name] 04/25/2013, 10:22 AM

## 2013-04-25 NOTE — Progress Notes (Signed)
Occupational Therapy Session Note  Patient Details  Name: DEVERICK PRUSS MRN: [id number] Date of Birth: 06/14/1946  Today's Date: 04/25/2013 Time: 1140-1200 Time Calculation (min): 20 min  Short Term Goals: Week 1:  OT Short Term Goal 1 (Week 1): STG=LTG  Skilled Therapeutic Interventions/Progress Updates:    Therapy session focused on functional transfers and activity tolerance. Pt recalled 3/3 back precautions. Completed stand pivot transfer with min-steadying assist using RW. Ambulated short household distances in room to simulate walking room to room in home to retrieve items. Pt with min-steadying assist during this activity. Pt required 3 short rest breaks during session. Good hand placement noted during sit<>stand transfers.   Therapy Documentation Precautions:  Precautions Precautions: Back;Fall Precaution Comments: left leg weaker than right leg.   Required Braces or Orthoses: Other Brace/Splint Other Brace/Splint: no back brace; Rt AFO Restrictions Weight Bearing Restrictions: No General: General Amount of Missed OT Time (min): 10 Minutes Vital Signs:   Pain: No c/o pain during therapy session.  See FIM for current functional status  Therapy/Group: Individual Therapy  Daneil Dan 04/25/2013, 12:05 PM

## 2013-04-25 NOTE — Progress Notes (Signed)
Physical Therapy Session Note  Patient Details  Name: [name] MRN: [id number] Date of Birth: August 19, 1946  Today's Date: 04/25/2013 Time: 1400-1445 Time Calculation (min): 45 min  Short Term Goals: Week 1:  PT Short Term Goal 1 (Week 1): =long term goals  Skilled Therapeutic Interventions/Progress Updates:    Pt reports feeling somewhat fatigued and sore from therapy today, states 4/10 pain score in LB and L LE anterior region. Pt performed bed mobility supine<>sit with min assist requires assistance with B LE and min vcs to improve technique and sequencing, sit to stand from various surfaces with use of RW with min assist and slight increased assistance from lower surfaces and min vcs to improve sequencing and hand and foot placement. Pt transfer to mat table with use of RW and CGA with pt c/o increased pain due to hardness of surface and states it is more difficulty to transfer to R side also stating that is how he would get into bed at home, moderate pain behaviors of grimacing and groaning in supine relief provided with therapist putting pillows to get B LEs at 90 degrees.  Pt performed gait training with RW min assist and wheelchair behind x 75',60',45',70' and 40' with pt reporting fatigue at end of session. Pt education on proper body mechanics and back precautions to improve transfers, standing balance, gait and bed mobility.  Therapy Documentation Precautions:  Precautions Precautions: Back;Fall Precaution Comments: left leg weaker than right leg.   Required Braces or Orthoses: Other Brace/Splint Other Brace/Splint: no back brace; Rt AFO Restrictions Weight Bearing Restrictions: No       Pain: Pain Assessment Pain Assessment: 0-10/10 Pain Score: 4/10 Pain Type: Acute pain Pain Location: Hip Pain Orientation: Left Pain Descriptors / Indicators: Aching Pain Frequency: Occasional Pain Onset: Gradual Patients Stated Pain Goal: 3 Pain Intervention(s): Medication (See  eMAR);Repositioned;Emotional support Multiple Pain Sites: No                      See FIM for current functional status  Therapy/Group: Individual Therapy  Jackelyn Knife 04/25/2013, 4:37 PM

## 2013-04-25 NOTE — Progress Notes (Signed)
Occupational Therapy Session Note  Patient Details  Name: SCHAWN BYAS MRN: [id number] Date of Birth: 1946-07-02  Today's Date: 04/25/2013 Time: 1000-1100 Time Calculation (min): 60 min  Short Term Goals: Week 1:  OT Short Term Goal 1 (Week 1): STG=LTG  Skilled Therapeutic Interventions/Progress Updates:    Pt resting in recliner upon arrival and stated he was looking forward to taking a shower this morning.  Pt amb with RW to bathroom for shower with sit<>stand from tub bench.  Pt issued long handle sponge to assist with bathing feet and used appropriately.  Pt amb with RW back to recliner to complete dressing.  Pt does not have any shirts yet and donned hospital gown and pajama bottoms.  Pt used reacher to assist with threading pants.  Pt required assistance with donning socks without AE but was able to don shoes.  Pt stated he had some shoes with velcro closures that he will probably start wearing.  Pt became short of breath with activity but O2 sats>97% on RA.  Pt required rest breaks throughout session.  Focus on activity tolerance, functional ambulation with RW, standing balance, and safety awareness.  Therapy Documentation Precautions:  Precautions Precautions: Back;Fall Precaution Comments: left leg weaker than right leg.   Required Braces or Orthoses: Other Brace/Splint Other Brace/Splint: no back brace; Rt AFO Restrictions Weight Bearing Restrictions: No   Pain: Pain Assessment Pain Assessment: No/denies pain Pain Score: 0-No pain  See FIM for current functional status  Therapy/Group: Individual Therapy  Rich Brave 04/25/2013, 11:04 AM

## 2013-04-25 NOTE — Progress Notes (Signed)
Patient ID: [name], male   DOB: 06-Jul-1946, 67 y.o.   MRN: [id number] Subjective/Complaints: [name] is a 67 y.o. right-handed male with history of renal transplant, ICD/pacemaker, systolic congestive heart failure, diabetes mellitus with peripheral neuropathy and chronic right foot drop. Patient with recent low back surgery 2 weeks ago for L3 decompression discectomy brought to the emergency room 04/09/2013 with altered mental status, mild shortness of breath. Patient noted to have low-grade fever with evidence of RLE cellulitis. He was started on IV antibiotics for SIRS. V/Q scan with low suspicion of PE. EKG paced rhythm with ST elevation in the inferior leads and 2D echo with evidence of large pleural effusion. LHC was consulted and felt ST changes non-specific and patient with evidence of volume overload without failure. He was started on colchicine and repeat echo Nephrology consulted for input on renal failure and recommended increasing diuretics to help with edema as well as effusion. Renal U/S without hydronephrosis. Was started on IV iron with aranesp for anemia of chronic disease.  CT of right knee showed no evidence of osteomyelitis there is a small joint effusion with subcutaneous edema diffusely about the knee likely due to possible cellulitis. Venous Doppler studies lower extremity showed no signs of DVT. CT lumbar spine showed interval postsurgical changes lumbar L3-4. An epidural air-fluid collection extending inferiorly from the disc space behind the L4 vertebral body and exerts mass effect on the thecal sac. Films evaluated by Dr. Gerlene Fee who felt that xray's with post op changes and doubtedt infection. He did develop diarrhea with worsening of renal status therefore colchicine discontinued and diuretics decreased back to lower dosage. Repeat echo 06/03 with slight decrease in size of effusion. 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 Lovell Sheehan. Back and hip pain improved  Denies any problems. Pain is improving. Would like a new AFO---his current device is off the shelf  Review of Systems  Gastrointestinal: Positive for constipation.  All other systems reviewed and are negative.    Objective: Vital Signs: Blood pressure 157/87, pulse 78, temperature 98.1 F (36.7 C), temperature source Oral, resp. rate 17, height 6\' 1"  (1.854 m), weight 71.5 kg (157 lb 10.1 oz), SpO2 99.00%. No results found. Results for orders placed during the hospital encounter of 04/23/13 (from the past 72 hour(s))  GLUCOSE, CAPILLARY     Status: Abnormal   Collection Time    04/23/13  5:40 PM      Result Value Range   Glucose-Capillary 194 (*) 70 - 99 mg/dL   Comment 1 Notify RN    GLUCOSE, CAPILLARY     Status: Abnormal   Collection Time    04/23/13  9:05 PM      Result Value Range   Glucose-Capillary 205 (*) 70 - 99 mg/dL   Comment 1 Notify RN    CBC WITH DIFFERENTIAL     Status: Abnormal   Collection Time    04/24/13  6:00 AM      Result Value Range   WBC 7.0  4.0 - 10.5 K/uL   RBC 3.34 (*) 4.22 - 5.81 MIL/uL   Hemoglobin 9.9 (*) 13.0 - 17.0 g/dL   HCT 40.9 (*) 81.1 - 91.4 %   MCV 95.2  78.0 - 100.0 fL   MCH 29.6  26.0 - 34.0 pg   MCHC 31.1  30.0 - 36.0 g/dL   RDW 78.2 (*) 95.6 - 21.3 %  Platelets 152  150 - 400 K/uL   Neutrophils Relative % 79 (*) 43 - 77 %   Neutro Abs 5.5  1.7 - 7.7 K/uL   Lymphocytes Relative 5 (*) 12 - 46 %   Lymphs Abs 0.4 (*) 0.7 - 4.0 K/uL   Monocytes Relative 15 (*) 3 - 12 %   Monocytes Absolute 1.0  0.1 - 1.0 K/uL   Eosinophils Relative 1  0 - 5 %   Eosinophils Absolute 0.1  0.0 - 0.7 K/uL   Basophils Relative 0  0 - 1 %   Basophils Absolute 0.0  0.0 - 0.1 K/uL  RENAL FUNCTION PANEL     Status: Abnormal   Collection Time    04/24/13  6:00 AM      Result Value Range   Sodium 137  135 - 145 mEq/L   Potassium 3.9  3.5 - 5.1 mEq/L   Chloride 101  96 - 112 mEq/L   CO2 26   19 - 32 mEq/L   Glucose, Bld 117 (*) 70 - 99 mg/dL   BUN 39 (*) 6 - 23 mg/dL   Creatinine, Ser 1.61 (*) 0.50 - 1.35 mg/dL   Calcium 8.4  8.4 - 09.6 mg/dL   Phosphorus 4.5  2.3 - 4.6 mg/dL   Albumin 2.6 (*) 3.5 - 5.2 g/dL   GFR calc non Af Amer 26 (*) >90 mL/min   GFR calc Af Amer 30 (*) >90 mL/min   Comment:            The eGFR has been calculated     using the CKD EPI equation.     This calculation has not been     validated in all clinical     situations.     eGFR's persistently     <90 mL/min signify     possible Chronic Kidney Disease.  GLUCOSE, CAPILLARY     Status: Abnormal   Collection Time    04/24/13  7:14 AM      Result Value Range   Glucose-Capillary 113 (*) 70 - 99 mg/dL   Comment 1 Notify RN    GLUCOSE, CAPILLARY     Status: Abnormal   Collection Time    04/24/13 11:22 AM      Result Value Range   Glucose-Capillary 135 (*) 70 - 99 mg/dL   Comment 1 Notify RN    GLUCOSE, CAPILLARY     Status: Abnormal   Collection Time    04/24/13  4:34 PM      Result Value Range   Glucose-Capillary 172 (*) 70 - 99 mg/dL   Comment 1 Notify RN    RENAL FUNCTION PANEL     Status: Abnormal   Collection Time    04/25/13  5:40 AM      Result Value Range   Sodium 138  135 - 145 mEq/L   Potassium 4.1  3.5 - 5.1 mEq/L   Chloride 100  96 - 112 mEq/L   CO2 26  19 - 32 mEq/L   Glucose, Bld 152 (*) 70 - 99 mg/dL   BUN 44 (*) 6 - 23 mg/dL   Creatinine, Ser 0.45 (*) 0.50 - 1.35 mg/dL   Calcium 8.5  8.4 - 40.9 mg/dL   Phosphorus 4.5  2.3 - 4.6 mg/dL   Albumin 2.7 (*) 3.5 - 5.2 g/dL   GFR calc non Af Amer 26 (*) >90 mL/min   GFR calc Af Amer 31 (*) >90 mL/min  Comment:            The eGFR has been calculated     using the CKD EPI equation.     This calculation has not been     validated in all clinical     situations.     eGFR's persistently     <90 mL/min signify     possible Chronic Kidney Disease.  GLUCOSE, CAPILLARY     Status: Abnormal   Collection Time    04/25/13   7:00 AM      Result Value Range   Glucose-Capillary 123 (*) 70 - 99 mg/dL   Comment 1 Notify RN      Nursing note and vitals reviewed.  Constitutional: He is oriented to person, place, and time. He appears well-developed and well-nourished.  HENT:  Head: Normocephalic and atraumatic.  Eyes: Pupils are equal, round, and reactive to light.  Cardiovascular: Normal rate and regular rhythm.  Pulmonary/Chest: Effort normal.  Abdominal: Soft. Bowel sounds are normal.  Musculoskeletal: He exhibits edema (1+ edema right foot).  Neurological: He is alert and oriented to person, place, and time.  Skin: Skin is warm. Rash (Multiple skin lesions with eccymotic areas exposed areas as well as scars from healed abrasions. ) noted. Multiple  Seborrheic  keratoses Back incision with OpSite in place.  Upper body strength 4/5 in bilateral deltoid, biceps, triceps, grip  Right lower extremity 4 minus hip flexor knee extensor tr to 1/5 in right ankle dorsiflexor , 2-3/5 APF Left lower extremity 3 minus hip flexor knee extensor and ankle dorsiflexor plantar flexor  Sensory mildly reduced in both feet     Assessment/Plan: 1. 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 Physiatrist is providing close team supervision and 24 hour management of active medical problems listed below. Physiatrist and rehab team continue to assess barriers to discharge/monitor patient progress toward functional and medical goals.  Will review with PT the functionality of his current AFO- He would like one that he can wear with shorts! I told him that any AFO he would use would be "visible" with shorts.    FIM: FIM - Bathing Bathing Steps Patient Completed: Chest;Right Arm;Left Arm;Abdomen Bathing: 2: Max-Patient completes 3-4 49f 10 parts or 25-49%  FIM - Upper Body  Dressing/Undressing Upper body dressing/undressing: 0: Wears gown/pajamas-no public clothing FIM - Lower Body Dressing/Undressing Lower body dressing/undressing: 0: Activity did not occur  FIM - Toileting Toileting: 0: Activity did not occur  FIM - Diplomatic Services operational officer Devices: Bedside commode;Elevated toilet seat;Walker Toilet Transfers: 4-To toilet/BSC: Min A (steadying Pt. > 75%);4-From toilet/BSC: Min A (steadying Pt. > 75%)  FIM - Bed/Chair Transfer Bed/Chair Transfer: 4: Supine > Sit: Min A (steadying Pt. > 75%/lift 1 leg);3: Bed > Chair or W/C: Mod A (lift or lower assist);3: Chair or W/C > Bed: Mod A (lift or lower assist)  FIM - Locomotion: Wheelchair Distance: 175 Locomotion: Wheelchair: 4: Travels 150 ft or more: maneuvers on rugs and over door sillls with minimal assistance (Pt.>75%) FIM - Locomotion: Ambulation Locomotion: Ambulation Assistive Devices: Walker - Rolling;Orthosis;Other (comment) (R AFO) Ambulation/Gait Assistance: 4: Min assist Locomotion: Ambulation: 2: Travels 50 - 149 ft with minimal assistance (Pt.>75%)  Comprehension Comprehension Mode: Auditory Comprehension: 5-Understands complex 90% of the time/Cues < 10% of the time  Expression Expression Mode: Verbal Expression: 6-Expresses complex ideas: With extra time/assistive device  Social Interaction Social Interaction: 6-Interacts appropriately with others with medication or extra time (anti-anxiety, antidepressant).  Problem Solving Problem Solving: 5-Solves basic 90% of the time/requires cueing < 10% of the time  Memory Memory: 5-Recognizes or recalls 90% of the time/requires cueing < 10% of the time  Medical Problem List and Plan:  1. DVT Prophylaxis/Anticoagulation: Pharmaceutical: Lovenox  2. Pain Management: prn oxycodone effective.  3. Mood: Motivated. Is feeling better past second surgery. Will have LCSW follow for evaluation.  4. Neuropsych: This patient is  capable of making decisions on his/her own behalf.  5. 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 Renal status improving with decrease in lasix and resolution of diarrhea.  6. Dm type 2: Continue levemir  7. Large pericardial effusion without tamponade: For 2D echo in several weeks past discharge.  8. LE cellulitis: resolved. Diarrhea improving off antibiotics. C-diff check negative. Will continue to monitor.  9. 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 Fluid overload improved. Continue coreg, lasix 80 mg/day,   -weight steady at 71kg 10. HTN: will monitor with bid checks. Continue lasix and coreg.  11. Anemia of chronic disease: treated with IV iron and on aranesp  12. Constipation with nausea:   -had bm 6/10---  -need to make sure he stays on track    LOS (Days) 2 A FACE TO FACE EVALUATION WAS PERFORMED  SWARTZ,ZACHARY T 04/25/2013, 8:16 AM

## 2013-04-25 NOTE — Progress Notes (Signed)
Social Work Patient ID: [name], male   DOB: 05/10/1946, 67 y.o.   MRN: [id number]  Met with patient today to review team conference.  Aware and agreeable with targeted d/c of 6/20 with mod i to supervision goals overall.  Denies any concerns at this time.  Will continue to follow for support and d/c planning.  Lysander Calixte, LCSW

## 2013-04-26 ENCOUNTER — Inpatient Hospital Stay (HOSPITAL_COMMUNITY): Payer: PRIVATE HEALTH INSURANCE | Admitting: Occupational Therapy

## 2013-04-26 ENCOUNTER — Inpatient Hospital Stay (HOSPITAL_COMMUNITY): Payer: PRIVATE HEALTH INSURANCE

## 2013-04-26 ENCOUNTER — Inpatient Hospital Stay (HOSPITAL_COMMUNITY): Payer: PRIVATE HEALTH INSURANCE | Admitting: Physical Therapy

## 2013-04-26 ENCOUNTER — Inpatient Hospital Stay (HOSPITAL_COMMUNITY): Payer: PRIVATE HEALTH INSURANCE | Admitting: *Deleted

## 2013-04-26 LAB — RENAL FUNCTION PANEL
CO2: 25 mEq/L (ref 19–32)
Calcium: 9 mg/dL (ref 8.4–10.5)
Creatinine, Ser: 2.09 mg/dL — ABNORMAL HIGH (ref 0.50–1.35)
GFR calc Af Amer: 37 mL/min — ABNORMAL LOW (ref >90)
GFR calc non Af Amer: 32 mL/min — ABNORMAL LOW (ref >90)
Phosphorus: 4.7 mg/dL — ABNORMAL HIGH (ref 2.3–4.6)
Sodium: 137 mEq/L (ref 135–145)

## 2013-04-26 LAB — GLUCOSE, CAPILLARY
Glucose-Capillary: 87 mg/dL (ref 70–99)
Glucose-Capillary: 161 mg/dL — ABNORMAL HIGH (ref 70–99)

## 2013-04-26 NOTE — Progress Notes (Signed)
Physical Therapy Session Note  Patient Details  Name: [name] MRN: [id number] Date of Birth: February 14, 1946  Today's Date: 04/26/2013 Time: 0800-0900 Time Calculation (min): 60 min  Short Term Goals: Week 1:  PT Short Term Goal 1 (Week 1): =long term goals  Skilled Therapeutic Interventions/Progress Updates:    Pt reports 4/10 painscore upon initiation of treatment and requested pain meds from nsg, meds administered. Discussed with pt pain med management in preparation for therapy. Pt pleasant and oriented.. Pt attempted to donn shoes and R AFO but requires assistance to maintain back precautions, pt is able to recall back precaution 3 out of 3 trials, requires assistance with footwear and R AFO. Pt performed sit<>stand, toilet transfers, w/c transfers with CGA to min assist requires increased assistance from lower surfaces, attempted sit<>supine on mat table again this time from opposite side being the pts left side with CGA , pt displayed pain behaviors of grimacing and groaning once in supine, stating it hurt too much to lay on his back on hard surface.  Pt performed standing static/dymnamic balance activities with 1 UE support on RW for reaching activities to improve functional reach and balance, attempted stairs pt completed 3 stairs up and 2 stairs down and requested sitting stating he was fatigued. Pt performed  Toe touch step ups in RW to 4 inch step x 5 each and x 5 reciprocating to improve single leg stance and WB tolerance on L LE.  Gait training with RW with w/c behind and CGA 40',98'11' and 45' . Pt tends to use back of legs for support when performing sit<>stand and transfers, pt education on proper set up, sequencing and hand and foot placement for all transfers.  Wheelchair mobs without foot rests for 75 feet x 2 with fatigue noted B LE.  Pt displays varying pain levels during treatment session 4-9/10 increases in supine and prolonged gait, balance or standing in L LE and back.    Therapy Documentation Precautions:  Precautions Precautions: Back;Fall Precaution Comments: left leg weaker than right leg.   Required Braces or Orthoses: Other Brace/Splint Other Brace/Splint: no back brace; Rt AFO Restrictions Weight Bearing Restrictions: No       Pain: Pain Assessment Pain Assessment: 0-10 Pain Score:   4-9/10 varies with activity Pain Type: Acute pain Pain Location: Hip Pain Orientation: Left Pain Descriptors / Indicators: Sore Pain Frequency: Occasional Pain Onset: Gradual Patients Stated Pain Goal: 3 Pain Intervention(s): RN made aware;Repositioned Multiple Pain Sites: No    Locomotion : Ambulation Ambulation/Gait Assistance: 4: Min guard  :          : See FIM for current functional status  Therapy/Group: Individual Therapy  Jackelyn Knife 04/26/2013, 12:24 PM

## 2013-04-26 NOTE — Progress Notes (Signed)
Occupational Therapy Session Note  Patient Details  Name: [name] MRN: [id number] Date of Birth: Dec 18, 1945  Today's Date: 04/26/2013 Time: 1030-1128 Time Calculation (min): 58 min  Short Term Goals: Week 1:  OT Short Term Goal 1 (Week 1): STG=LTG  Skilled Therapeutic Interventions/Progress Updates:    Pt seated in recliner upon arrival.  Pt engaged in bathing at shower level and dressing with sit<>stand from recliner.  Pt required min A for sit<>stand from tub bench.  Pt used long handle sponge to assist with LB bathing.  Pt amb with RW to room to complete dressing tasks.  Pt used reacher appropriately to thread pants. Pt performed sit<>stand from recliner with supervision.  Pt required multiple rest breaks throughout session secondary to increased fatigue with activity.  Focus on activity tolerance, standing balance, functional amb with RW, and safety awareness.  Therapy Documentation Precautions:  Precautions Precautions: Back;Fall Precaution Comments: left leg weaker than right leg.   Required Braces or Orthoses: Other Brace/Splint Other Brace/Splint: no back brace; Rt AFO Restrictions Weight Bearing Restrictions: No Pain: Pain Assessment Pain Assessment: 0-10 Pain Score:   4 Pain Type: Acute pain Pain Location: Hip Pain Orientation: Left Pain Descriptors / Indicators: Sore Pain Frequency: Occasional Pain Onset: Gradual Patients Stated Pain Goal: 3 Pain Intervention(s): RN made aware;Repositioned Multiple Pain Sites: No  See FIM for current functional status  Therapy/Group: Individual Therapy  Rich Brave 04/26/2013, 11:32 AM

## 2013-04-26 NOTE — Progress Notes (Signed)
Occupational Therapy Session Note  Patient Details  Name: [name] MRN: [id number] Date of Birth: [birth year]  Today's Date: 04/26/2013  Short Term Goals: Week 1:  OT Short Term Goal 1 (Week 1): STG=LTG  Skilled Therapeutic Interventions/Progress Updates:  Session Note: Time: 0935-1005 (30 mins) Pt reported pain in RLE. No rate given. Pt stated he recently received pain meds.  Upon entering room, pt supine in bed. Pt transferred->W/C using R/W and completed grooming tasks seated in W/C @ sink. OT issued elastic shoe laces to increase independence with LB BADL's. Pt transferred W/C->recliner using scoots to L side. OT discussed BR set-up @ home and the option of sitting during grooming tasks. At end of tx session, pt seated in recliner with call bell within reach.  Therapy Documentation Precautions:  Precautions Precautions: Back;Fall Precaution Comments: left leg weaker than right leg.   Required Braces or Orthoses: Other Brace/Splint Other Brace/Splint: no back brace; Rt AFO Restrictions Weight Bearing Restrictions: No  See FIM for current functional status  Therapy/Group: Individual Therapy  [name] 04/26/2013, 7:13 AM

## 2013-04-26 NOTE — Progress Notes (Signed)
Note reviewed and accurately reflects treatment session.   

## 2013-04-26 NOTE — Progress Notes (Signed)
Occupational Therapy Session Note  Patient Details  Name: [name] MRN: [id number] Date of Birth: 08/24/1946  Today's Date: 04/26/2013 Time: 4098-1191 Time Calculation (min): 49 min  Skilled Therapeutic Interventions/Progress Updates:    Therapist helped donn pt's right AFO and socks and shoes this session secondary to not having AE.  Pt reports that his wife is going to purchase AE from the gift shop later today.  Pt utilized RW for functional mobility with min guard assist to ambulate to the gym.  Needed one sitting rest break to achieve this.  Mod instructional cueing to stand up tall and not look down at the floor.  In the gym utilized the Nustep to help with endurance, LB strengthening, and UE strengthening.  Pt performed for 2 intervals of 6 mins each on level 4 and level 6 resistance.  Pt with no complaints of pain during session only stating, "I can't believe how weak my legs are."  When finished transferred back to the wheelchair and pushed pt back to the room.  Therapy Documentation Precautions:  Precautions Precautions: Back;Fall Precaution Comments: left leg weaker than right leg.   Required Braces or Orthoses: Other Brace/Splint Other Brace/Splint: no back brace; Rt AFO Restrictions Weight Bearing Restrictions: No  Vital Signs: Therapy Vitals Temp: 98.4 F (36.9 C) Temp src: Oral Pulse Rate: 83 Resp: 20 BP: 150/77 mmHg Patient Position, if appropriate: Lying Oxygen Therapy SpO2: 95 % O2 Device: None (Room air) Pain: Pain Assessment Pain Assessment: 0-10 Pain Score:   4 Pain Type: Acute pain Pain Location: Hip Pain Orientation: Left Pain Descriptors / Indicators: Sore Pain Frequency: Occasional Pain Onset: Gradual Patients Stated Pain Goal: 3 Pain Intervention(s): Medication (See eMAR) Multiple Pain Sites: No ADL: See FIM for current functional status  Therapy/Group: Individual Therapy  Aubriee Szeto OTR/L 04/26/2013, 4:22 PM

## 2013-04-26 NOTE — Progress Notes (Signed)
Patient ID: [name], male   DOB: 05/14/1946, 67 y.o.   MRN: [id number] Subjective/Complaints: [name] is a 67 y.o. right-handed male with history of renal transplant, ICD/pacemaker, systolic congestive heart failure, diabetes mellitus with peripheral neuropathy and chronic right foot drop. Patient with recent low back surgery 2 weeks ago for L3 decompression discectomy brought to the emergency room 04/09/2013 with altered mental status, mild shortness of breath. Patient noted to have low-grade fever with evidence of RLE cellulitis. He was started on IV antibiotics for SIRS. V/Q scan with low suspicion of PE. EKG paced rhythm with ST elevation in the inferior leads and 2D echo with evidence of large pleural effusion. LHC was consulted and felt ST changes non-specific and patient with evidence of volume overload without failure. He was started on colchicine and repeat echo Nephrology consulted for input on renal failure and recommended increasing diuretics to help with edema as well as effusion. Renal U/S without hydronephrosis. Was started on IV iron with aranesp for anemia of chronic disease.  CT of right knee showed no evidence of osteomyelitis there is a small joint effusion with subcutaneous edema diffusely about the knee likely due to possible cellulitis. Venous Doppler studies lower extremity showed no signs of DVT. CT lumbar spine showed interval postsurgical changes lumbar L3-4. An epidural air-fluid collection extending inferiorly from the disc space behind the L4 vertebral body and exerts mass effect on the thecal sac. Films evaluated by Dr. Gerlene Fee who felt that xray's with post op changes and doubtedt infection. He did develop diarrhea with worsening of renal status therefore colchicine discontinued and diuretics decreased back to lower dosage. Repeat echo 06/03 with slight decrease in size of effusion. 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 Lovell Sheehan. Back and hip pain improved  Had a good night. Back feeling better. No complaints  Review of Systems  Gastrointestinal: Positive for constipation.  All other systems reviewed and are negative.    Objective: Vital Signs: Blood pressure 133/78, pulse 73, temperature 98 F (36.7 C), temperature source Oral, resp. rate 20, height 6\' 1"  (1.854 m), weight 72.1 kg (158 lb 15.2 oz), SpO2 97.00%. No results found. Results for orders placed during the hospital encounter of 04/23/13 (from the past 72 hour(s))  GLUCOSE, CAPILLARY     Status: Abnormal   Collection Time    04/23/13  5:40 PM      Result Value Range   Glucose-Capillary 194 (*) 70 - 99 mg/dL   Comment 1 Notify RN    GLUCOSE, CAPILLARY     Status: Abnormal   Collection Time    04/23/13  9:05 PM      Result Value Range   Glucose-Capillary 205 (*) 70 - 99 mg/dL   Comment 1 Notify RN    CBC WITH DIFFERENTIAL     Status: Abnormal   Collection Time    04/24/13  6:00 AM      Result Value Range   WBC 7.0  4.0 - 10.5 K/uL   RBC 3.34 (*) 4.22 - 5.81 MIL/uL   Hemoglobin 9.9 (*) 13.0 - 17.0 g/dL   HCT 28.4 (*) 13.2 - 44.0 %   MCV 95.2  78.0 - 100.0 fL   MCH 29.6  26.0 - 34.0 pg   MCHC 31.1  30.0 - 36.0 g/dL   RDW 10.2 (*) 72.5 - 36.6 %   Platelets 152  150 - 400 K/uL  Neutrophils Relative % 79 (*) 43 - 77 %   Neutro Abs 5.5  1.7 - 7.7 K/uL   Lymphocytes Relative 5 (*) 12 - 46 %   Lymphs Abs 0.4 (*) 0.7 - 4.0 K/uL   Monocytes Relative 15 (*) 3 - 12 %   Monocytes Absolute 1.0  0.1 - 1.0 K/uL   Eosinophils Relative 1  0 - 5 %   Eosinophils Absolute 0.1  0.0 - 0.7 K/uL   Basophils Relative 0  0 - 1 %   Basophils Absolute 0.0  0.0 - 0.1 K/uL  RENAL FUNCTION PANEL     Status: Abnormal   Collection Time    04/24/13  6:00 AM      Result Value Range   Sodium 137  135 - 145 mEq/L   Potassium 3.9  3.5 - 5.1 mEq/L   Chloride 101  96 - 112 mEq/L   CO2 26  19 - 32 mEq/L   Glucose, Bld 117 (*) 70 - 99  mg/dL   BUN 39 (*) 6 - 23 mg/dL   Creatinine, Ser 4.09 (*) 0.50 - 1.35 mg/dL   Calcium 8.4  8.4 - 81.1 mg/dL   Phosphorus 4.5  2.3 - 4.6 mg/dL   Albumin 2.6 (*) 3.5 - 5.2 g/dL   GFR calc non Af Amer 26 (*) >90 mL/min   GFR calc Af Amer 30 (*) >90 mL/min   Comment:            The eGFR has been calculated     using the CKD EPI equation.     This calculation has not been     validated in all clinical     situations.     eGFR's persistently     <90 mL/min signify     possible Chronic Kidney Disease.  GLUCOSE, CAPILLARY     Status: Abnormal   Collection Time    04/24/13  7:14 AM      Result Value Range   Glucose-Capillary 113 (*) 70 - 99 mg/dL   Comment 1 Notify RN    GLUCOSE, CAPILLARY     Status: Abnormal   Collection Time    04/24/13 11:22 AM      Result Value Range   Glucose-Capillary 135 (*) 70 - 99 mg/dL   Comment 1 Notify RN    GLUCOSE, CAPILLARY     Status: Abnormal   Collection Time    04/24/13  4:34 PM      Result Value Range   Glucose-Capillary 172 (*) 70 - 99 mg/dL   Comment 1 Notify RN    GLUCOSE, CAPILLARY     Status: Abnormal   Collection Time    04/24/13  8:31 PM      Result Value Range   Glucose-Capillary 162 (*) 70 - 99 mg/dL   Comment 1 Notify RN    RENAL FUNCTION PANEL     Status: Abnormal   Collection Time    04/25/13  5:40 AM      Result Value Range   Sodium 138  135 - 145 mEq/L   Potassium 4.1  3.5 - 5.1 mEq/L   Chloride 100  96 - 112 mEq/L   CO2 26  19 - 32 mEq/L   Glucose, Bld 152 (*) 70 - 99 mg/dL   BUN 44 (*) 6 - 23 mg/dL   Creatinine, Ser 9.14 (*) 0.50 - 1.35 mg/dL   Calcium 8.5  8.4 - 78.2 mg/dL   Phosphorus 4.5  2.3 - 4.6 mg/dL   Albumin 2.7 (*) 3.5 - 5.2 g/dL   GFR calc non Af Amer 26 (*) >90 mL/min   GFR calc Af Amer 31 (*) >90 mL/min   Comment:            The eGFR has been calculated     using the CKD EPI equation.     This calculation has not been     validated in all clinical     situations.     eGFR's persistently     <90  mL/min signify     possible Chronic Kidney Disease.  GLUCOSE, CAPILLARY     Status: Abnormal   Collection Time    04/25/13  7:00 AM      Result Value Range   Glucose-Capillary 123 (*) 70 - 99 mg/dL   Comment 1 Notify RN    GLUCOSE, CAPILLARY     Status: Abnormal   Collection Time    04/25/13 11:26 AM      Result Value Range   Glucose-Capillary 113 (*) 70 - 99 mg/dL   Comment 1 Notify RN    GLUCOSE, CAPILLARY     Status: Abnormal   Collection Time    04/25/13  4:57 PM      Result Value Range   Glucose-Capillary 127 (*) 70 - 99 mg/dL  GLUCOSE, CAPILLARY     Status: Abnormal   Collection Time    04/25/13  9:16 PM      Result Value Range   Glucose-Capillary 159 (*) 70 - 99 mg/dL  RENAL FUNCTION PANEL     Status: Abnormal   Collection Time    04/26/13  5:55 AM      Result Value Range   Sodium 137  135 - 145 mEq/L   Potassium 3.8  3.5 - 5.1 mEq/L   Chloride 101  96 - 112 mEq/L   CO2 25  19 - 32 mEq/L   Glucose, Bld 153 (*) 70 - 99 mg/dL   BUN 45 (*) 6 - 23 mg/dL   Creatinine, Ser 1.61 (*) 0.50 - 1.35 mg/dL   Calcium 9.0  8.4 - 09.6 mg/dL   Phosphorus 4.7 (*) 2.3 - 4.6 mg/dL   Albumin 2.7 (*) 3.5 - 5.2 g/dL   GFR calc non Af Amer 32 (*) >90 mL/min   GFR calc Af Amer 37 (*) >90 mL/min   Comment:            The eGFR has been calculated     using the CKD EPI equation.     This calculation has not been     validated in all clinical     situations.     eGFR's persistently     <90 mL/min signify     possible Chronic Kidney Disease.  GLUCOSE, CAPILLARY     Status: Abnormal   Collection Time    04/26/13  7:32 AM      Result Value Range   Glucose-Capillary 117 (*) 70 - 99 mg/dL    Nursing note and vitals reviewed.  Constitutional: He is oriented to person, place, and time. He appears well-developed and well-nourished.  HENT:  Head: Normocephalic and atraumatic.  Eyes: Pupils are equal, round, and reactive to light.  Cardiovascular: Normal rate and regular rhythm.   Pulmonary/Chest: Effort normal.  Abdominal: Soft. Bowel sounds are normal.  Musculoskeletal: He exhibits edema (1+ edema right foot).  Neurological: He is alert and oriented to person, place, and time.  Skin: Skin is warm. Rash (Multiple skin lesions with eccymotic areas exposed areas as well as scars from healed abrasions. ) noted. Multiple  Seborrheic  keratoses Back incision with OpSite in place.  Upper body strength 4/5 in bilateral deltoid, biceps, triceps, grip  Right lower extremity 4 minus hip flexor knee extensor tr to 1/5 in right ankle dorsiflexor , 2-3/5 APF Left lower extremity 3 minus hip flexor knee extensor and ankle dorsiflexor plantar flexor  Sensory mildly reduced in both feet     Assessment/Plan: 1. 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 Physiatrist is providing close team supervision and 24 hour management of active medical problems listed below. Physiatrist and rehab team continue to assess barriers to discharge/monitor patient progress toward functional and medical goals.  [employer] to make adjustments to brace today. Has ipsilon type brace   FIM: FIM - Bathing Bathing Steps Patient Completed: Chest;Right Arm;Left Arm;Abdomen;Front perineal area;Buttocks;Right upper leg;Left upper leg;Right lower leg (including foot);Left lower leg (including foot) Bathing: 4: Steadying assist  FIM - Upper Body Dressing/Undressing Upper body dressing/undressing: 0: Wears gown/pajamas-no public clothing FIM - Lower Body Dressing/Undressing Lower body dressing/undressing steps patient completed: Thread/unthread right pants leg;Thread/unthread left pants leg;Pull pants up/down;Fasten/unfasten pants Lower body dressing/undressing: 3: Mod-Patient completed 50-74% of tasks  FIM - Toileting Toileting: 0: Activity did not occur  FIM  - Diplomatic Services operational officer Devices: Bedside commode;Elevated toilet seat;Walker Toilet Transfers: 4-To toilet/BSC: Min A (steadying Pt. > 75%);4-From toilet/BSC: Min A (steadying Pt. > 75%)  FIM - Bed/Chair Transfer Bed/Chair Transfer Assistive Devices: HOB elevated;Bed rails;Walker Bed/Chair Transfer: 5: Supine > Sit: Supervision (verbal cues/safety issues);4: Bed > Chair or W/C: Min A (steadying Pt. > 75%);4: Chair or W/C > Bed: Min A (steadying Pt. > 75%)  FIM - Locomotion: Wheelchair Distance: 150 Locomotion: Wheelchair: 5: Travels 150 ft or more: maneuvers on rugs and over door sills with supervision, cueing or coaxing FIM - Locomotion: Ambulation Locomotion: Ambulation Assistive Devices: Walker - Rolling;Orthosis;Other (comment) Ambulation/Gait Assistance: 4: Min guard Locomotion: Ambulation: 2: Travels 50 - 149 ft with minimal assistance (Pt.>75%)  Comprehension Comprehension Mode: Auditory Comprehension: 5-Understands complex 90% of the time/Cues < 10% of the time  Expression Expression Mode: Verbal Expression: 5-Expresses basic 90% of the time/requires cueing < 10% of the time.  Social Interaction Social Interaction: 6-Interacts appropriately with others with medication or extra time (anti-anxiety, antidepressant).  Problem Solving Problem Solving: 5-Solves complex 90% of the time/cues < 10% of the time  Memory Memory: 5-Recognizes or recalls 90% of the time/requires cueing < 10% of the time  Medical Problem List and Plan:  1. DVT Prophylaxis/Anticoagulation: Pharmaceutical: Lovenox  2. Pain Management: prn oxycodone effective.  3. Mood: Motivated. Is feeling better past second surgery. Will have LCSW follow for evaluation.  4. Neuropsych: This patient is capable of making decisions on his/her own behalf.  5. 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 Renal status improving with decrease in lasix and resolution of diarrhea.   6. Dm type 2: Continue levemir --reasonable control at present 7. Large pericardial effusion without tamponade: For 2D echo in several weeks past discharge.  8. LE cellulitis: resolved. Diarrhea improving off antibiotics. C-diff check negative. Will continue to monitor.  9. 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 Fluid overload improved. Continue coreg, lasix 80 mg/day,   -weight steady at 71 to 72 kg 10.  HTN: will monitor with bid checks. Continue lasix and coreg.  11. Anemia of chronic disease: treated with IV iron and on aranesp  12. Constipation with nausea:   -had bm 6/10---  -needs one today    LOS (Days) 3 A FACE TO FACE EVALUATION WAS PERFORMED  SWARTZ,ZACHARY T 04/26/2013, 8:49 AM

## 2013-04-26 NOTE — Evaluation (Signed)
Recreational Therapy Assessment and Plan  Patient Details  Name: [name] MRN: [id number] Date of Birth: [birth year] Today's Date: 04/26/2013  Rehab Potential: Good ELOS: 10 days   Assessment Clinical Impression: Problem List:  Patient Active Problem List    Diagnosis  Date Noted   .  HNP (herniated nucleus pulposus), lumbar--redo microdisectomy 04/19/13  04/24/2013   .  S/P laminectomy  04/22/2013   .  Positive D dimer  04/10/2013   .  Dehydration  04/10/2013   .  Acute respiratory failure with hypoxia  04/10/2013   .  Hx of decompressive lumbar laminectomy (L-3) [id number], 2014  04/10/2013   .  Pericardial effusion  04/10/2013   .  SIRS (systemic inflammatory response syndrome)  04/09/2013   .  Demand ischemia  04/09/2013   .  Diabetes mellitus type 2, controlled  04/09/2013   .  Cellulitis of right lower extremity  04/09/2013   .  CAD (coronary artery disease)  04/09/2013   .  Renal failure (ARF), acute on chronic kidney disease stage 3  04/09/2013   .  HTN (hypertension)  10/04/2012   .  Septic arthritis of knee, left  08/23/2012   .  History of renal transplant  08/23/2012    Past Medical History:  Past Medical History   Diagnosis  Date   .  Myocardial infarction  04/12/1990   .  CHF (congestive heart failure)  01/2012   .  Hypertension    .  Diabetes mellitus    .  ICD (implantable cardiac defibrillator) in place    .  Pacemaker  02/15/2012     [employer]   .  Pneumonia  2011   .  Arthritis    .  History of blood transfusion    .  Chronic kidney disease      Glomerulonephritis   .  Dysrhythmia    .  Cancer      Bladder Cancer- 2004 . SkinCancer- Basal, Squamous, and a few melymona   .  Right foot drop     Past Surgical History:  Past Surgical History   Procedure  Laterality  Date   .  Kidney transplant   02/13/1973   .  Coronary angioplasty   03/1990   .  Skin cancer excision       Numerous   .  Eye surgery       Cataract Right Eye   .  Insert / replace  / remove pacemaker     .  Uretheral transplation       Left to Right (transplanted kidney)   .  Skin transplant       Left thigh to Left hand   .  Knee arthroscopy   07/25/2012     Procedure: ARTHROSCOPY KNEE; Surgeon: Cammy Copa, MD; Location: [facility] OR; Service: Orthopedics; Laterality: Left; Left knee arthroscopy, debridement, cultures   .  Lumbar laminectomy/decompression microdiscectomy  Bilateral  03/26/2013     Procedure: LUMBAR LAMINECTOMY/DECOMPRESSION MICRODISCECTOMY 1 LEVEL; Surgeon: Reinaldo Meeker, MD; Location: [facility]; Service: Neurosurgery; Laterality: Bilateral;   .  Lumbar laminectomy/decompression microdiscectomy  N/A  04/19/2013     Procedure: Redo Lumbar Three to Four Decompression One LEVEL; Surgeon: Cristi Loron, MD; Location: [facility]; Service: Neurosurgery; Laterality: N/A; Redo Lumbar Three to Four Decompression One LEVEL    Assessment & Plan  Clinical Impression: [name] is a 67 y.o. right-handed male with history of renal transplant,  ICD/pacemaker, systolic congestive heart failure, diabetes mellitus with peripheral neuropathy and chronic right foot drop. Patient with recent low back surgery 2 weeks ago for L3 decompression discectomy brought to the emergency room 04/09/2013 with altered mental status, mild shortness of breath. Patient noted to have low-grade fever with evidence of RLE cellulitis. EKG paced rhythm with ST elevation in the inferior leads and 2D echo with evidence of large pleural effusion. LHC was consulted and felt ST changes non-specific and patient with evidence of volume overload without failure. He was started on colchicine and repeat echo Nephrology consulted for input on renal failure and recommended increasing diuretics to help with edema as well as effusion. Renal U/S without hydronephrosis. CT lumbar spine showed interval postsurgical changes lumbar L3-4. An epidural air-fluid collection extending inferiorly from the disc space  behind the L4 vertebral body and exerts mass effect on the thecal sac. Films evaluated by Dr. Gerlene Fee who felt that xray's with post op changes and doubted infection. He did develop diarrhea with worsening of renal status therefore colchicine discontinued and diuretics decreased back to lower dosage. Patient underwent redo microdiskectomy by Dr. Lovell Sheehan. Back and hip pain improved. Patient transferred to CIR on 04/23/2013.    Pt presents with decreased activity tolerance, decreased functional mobility, decreased balance, increased pain Limiting pt's independence with leisure/community pursuits.  Leisure History/Participation Premorbid leisure interest/current participation: Garment/textile technologist - Journalist, newspaper - Press photographer - Travel (Comment) Other Leisure Interests: Television;Movies;Reading Leisure Participation Style: With Family/Friends Awareness of Community Resources: Good-identify 3 post discharge leisure resources Psychosocial / Spiritual Patient agreeable to Pet Therapy: Yes Does patient have pets?: Yes Social interaction - Mood/Behavior: Cooperative Firefighter Appropriate for Education?: Yes Patient Agreeable to [employer]?: Yes Recreational Therapy Orientation Orientation -Reviewed with patient: Available activity resources Strengths/Weaknesses Patient Strengths/Abilities: Willingness to participate Patient weaknesses: Physical limitations  Plan Rec Therapy Plan Is patient appropriate for Therapeutic Recreation?: Yes Rehab Potential: Good Treatment times per week: Min 2 times per week >20 minutes Estimated Length of Stay: 10 days TR Treatment/Interventions: Adaptive equipment instruction;1:1 session;Balance/vestibular training;Community reintegration;Functional mobility training;Patient/family education;Recreation/leisure participation;Therapeutic activities;Therapeutic exercise  Recommendations for other services: None  Discharge Criteria: Patient will  be discharged from TR if patient refuses treatment 3 consecutive times without medical reason.  If treatment goals not met, if there is a change in medical status, if patient makes no progress towards goals or if patient is discharged from hospital.  The above assessment, treatment plan, treatment alternatives and goals were discussed and mutually agreed upon: by patient  Hakeen Shipes 04/26/2013, 4:05 PM

## 2013-04-27 ENCOUNTER — Inpatient Hospital Stay (HOSPITAL_COMMUNITY): Payer: PRIVATE HEALTH INSURANCE | Admitting: Physical Therapy

## 2013-04-27 ENCOUNTER — Inpatient Hospital Stay (HOSPITAL_COMMUNITY): Payer: PRIVATE HEALTH INSURANCE | Admitting: Occupational Therapy

## 2013-04-27 ENCOUNTER — Inpatient Hospital Stay (HOSPITAL_COMMUNITY): Payer: PRIVATE HEALTH INSURANCE

## 2013-04-27 DIAGNOSIS — R5381 Other malaise: Secondary | ICD-10-CM

## 2013-04-27 LAB — RENAL FUNCTION PANEL
CO2: 24 mEq/L (ref 19–32)
Calcium: 9 mg/dL (ref 8.4–10.5)
GFR calc Af Amer: 36 mL/min — ABNORMAL LOW (ref >90)
GFR calc non Af Amer: 31 mL/min — ABNORMAL LOW (ref >90)
Phosphorus: 4.6 mg/dL (ref 2.3–4.6)
Potassium: 3.7 mEq/L (ref 3.5–5.1)
Sodium: 135 mEq/L (ref 135–145)

## 2013-04-27 LAB — GLUCOSE, CAPILLARY: Glucose-Capillary: 180 mg/dL — ABNORMAL HIGH (ref 70–99)

## 2013-04-27 NOTE — Progress Notes (Signed)
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  Pt educated and able to return demonstrate safe use of the walker for transporting items around the kitchen using the walker as well as flat surfaces.  Pt reports having a island in the middle of the kitchen as well that has shorter bar stools he can sit at when he fixes his lunch.  Also discussed using a walker bag to help with transporting items as well as not to try and hang onto something and the walker at the same time.  Pt verbalizes understanding. He was able to transport kitchen items as well as perform all transfers with min guard assist during session.  Transitioned from kitchen to gym for use of upper extremity ergonometer.  Pt able to perform 2 intervals of 6 mins on level 8 resistance, maintaining RPMs at 20-25 per minute.  Took approximately 2 mins rest between sets.    Therapy Documentation Precautions:  Precautions Precautions: Back;Fall Precaution Comments: left leg weaker than right leg.   Required Braces or Orthoses: Other Brace/Splint Other Brace/Splint: no back brace; Rt AFO Restrictions Weight Bearing Restrictions: No  Pain: Pain Assessment Pain Assessment: No/denies pain Pain Score:   2 Pain Type: Acute pain Pain Location: Back Pain Descriptors / Indicators: Sore Pain Intervention(s): RN made aware ADL: See FIM for current functional status  Therapy/Group: Individual Therapy  Makaelah Cranfield OTR/L Pager number [phone] 04/27/2013, 12:13 PM

## 2013-04-27 NOTE — Progress Notes (Signed)
Physical Therapy Session Note  Patient Details  Name: [name] MRN: [id number] Date of Birth: 13-May-1946  Today's Date: 04/27/2013 Time: 0933-1030 Time Calculation (min): 57 min  Short Term Goals: Week 1:  PT Short Term Goal 1 (Week 1): =long term goals  Skilled Therapeutic Interventions/Progress Updates:    PT contacted RN about Lt. Elbow skin tear, Tegaderm applied for therapy. Ambulation to gym x >150' with RW and min-guard assist with one seated rest break verbal and tactile cues for upright posture. Sit <> stands from elevated mat working on decreasing bil. UE use and promoting strengthening of bil. LEs. Mini squats with PT facilitating Lt. Weight shift as pt tends to over use Rt. LE due to it being stronger than Lt. Side stepping with mini squat hold and RW for support. NuStep level 2 x 5 min with multiple rest breaks, bil. LEs only. Intermittent need for assist due to Lt. Quad weakness.   Rt. AFO worn throughout session.   Therapy Documentation Precautions:  Precautions Precautions: Back;Fall Precaution Comments: left leg weaker than right leg.   Required Braces or Orthoses: Other Brace/Splint Other Brace/Splint: no back brace; Rt AFO Restrictions Weight Bearing Restrictions: No Pain: Pain Assessment Pain Assessment: No/denies pain Pain Score:   2 Pain Type: Acute pain Pain Location: Back Pain Descriptors / Indicators: Sore Pain Intervention(s): RN made aware See FIM for current functional status  Therapy/Group: Individual Therapy  Wilhemina Bonito 04/27/2013, 12:29 PM

## 2013-04-27 NOTE — Progress Notes (Signed)
Occupational Therapy Session Note  Patient Details  Name: [name] MRN: [id number] Date of Birth: 1945/12/13  Today's Date: 04/27/2013 Time: 0800-0900 Time Calculation (min): 60 min  Short Term Goals: Week 1:  OT Short Term Goal 1 (Week 1): STG=LTG  Skilled Therapeutic Interventions/Progress Updates:    Pt resting in bed upon arrival and requested to shave before taking shower this morning.  Pt amb with RW to w/c and completed shaving while seated.  Pt amb with RW to shower and completed shower with sit<>stand for washing buttocks and using long handle sponge to bathe feet.  After walking back to recliner pt completed dressing using reacher to thread pants.  Pt required only steady A while standing to pull up pants.  Pt did not don socks/shoes this morning secondary application of cream to bilateral feet.  Pt required multiple rest breaks throughout session but completed all sit<>stand with supervision only.  Focus on activity tolerance, dynamic standing balance, functional ambulation with RW, and safety awareness.  Therapy Documentation Precautions:  Precautions Precautions: Back;Fall Precaution Comments: left leg weaker than right leg.   Required Braces or Orthoses: Other Brace/Splint Other Brace/Splint: no back brace; Rt AFO Restrictions Weight Bearing Restrictions: No   Pain: Pain Assessment Pain Score:   5 Pain Type: Acute pain Pain Location: Leg Pain Orientation: Left Pain Descriptors / Indicators: Sore Pain Onset: Gradual Patients Stated Pain Goal: 2 Pain Intervention(s): RN made aware;Repositioned  See FIM for current functional status  Therapy/Group: Individual Therapy  Rich Brave 04/27/2013, 9:03 AM

## 2013-04-27 NOTE — Progress Notes (Signed)
Physical Therapy Session Note  Patient Details  Name: [name] MRN: [id number] Date of Birth: [birth year]  Today's Date: 04/27/2013 Time: 1400-1430 Time Calculation (min): 30 min  Short Term Goals: Week 1:  PT Short Term Goal 1 (Week 1): =long term goals  Skilled Therapeutic Interventions/Progress Updates:    Pt reporting increased Lt. Knee at anterior joint line during session. Suspect pain caused by hyperexetnsion of Lt. Knee during gait.   Session focused on neuromuscular reeducation during gait. PT facilitation of Lt. Pelvic anterior translation over Lt. foot during stance on Lt. LE + Lt. Knee control during Lt. stance and terminal stance. Pt able to carry over mechanics by end of session without facilitation however continued assessment needed. Multiple bouts of ambulation accumulating to ~ 200' with RW and overall min assist.   Therapy Documentation Precautions:  Precautions Precautions: Back;Fall Precaution Comments: left leg weaker than right leg.   Required Braces or Orthoses: Other Brace/Splint Other Brace/Splint: no back brace; Rt AFO Restrictions Weight Bearing Restrictions: No Pain: Pain Assessment Pain Assessment: No/denies pain Pain Score:   4 Pain Type: Acute pain Pain Location: Knee Pain Orientation: Left Pain Descriptors / Indicators: Sore Pain Onset:  (during stance phase of gait on lt. and with palpation) Pain Intervention(s):  (gait mechanics)  See FIM for current functional status  Therapy/Group: Co-Treatment  Wilhemina Bonito 04/27/2013, 3:32 PM

## 2013-04-27 NOTE — Progress Notes (Signed)
NUTRITION FOLLOW UP  DOCUMENTATION CODES  Per approved criteria   -Non-severe (moderate) malnutrition in the context of chronic illness    Intervention:   Continue current interventions.  Nutrition Dx:   Inadequate oral intake related to food preferences as evidenced by pt report. Improving.  Goal:   Intake to meet >90% of estimated nutrition needs. Met.  Monitor:   weight trends, lab trends, I/O's, PO intake  Assessment:   PMHx significant for renal transplant (pt is s/p tx x 40 years - oldest living renal transplant recipient in state), ICD/pacemaker, CHF, DM. S/p recent back surgery 2 weeks ago for L3 decompression discectomy. Admitted 5/26 with AMS, low-grade fever, RLE cellulitis and SOB. Pt was diuresed per renal. CT of knee showed joint effusion with edema, possibly 2/2 cellulitis. CT of spine showed air-fluid collection s/p recent surgery. Underwent redo microdisckectomy.  D/c date planned for 6/20.  Continues on Carbohydrate Modified Medium diet. Pt consuming >50% of meals.  Pt meets criteria for moderate MALNUTRITION in the context of chronic illness as evidenced by 15% wt loss x 7 months and intake of <75% of estimated energy needs x at least 1 month.    Height: Ht Readings from Last 1 Encounters:  04/23/13 6\' 1"  (1.854 m)    Weight Status:   Wt Readings from Last 1 Encounters:  04/27/13 157 lb 3 oz (71.3 kg)  Admit wt 157 lb - stable  Re-estimated needs:  Kcal: 1700 - 1900 Protein: 71 - 85 g Fluid: 1.7 - 1.9 liters  Skin:  Edema to R foot  Back incisions  Skin tears  Diet Order: Carb Control Medium (1600 - 2000)   Intake/Output Summary (Last 24 hours) at 04/27/13 1448 Last data filed at 04/27/13 1400  Gross per 24 hour  Intake    840 ml  Output   2170 ml  Net  -1330 ml    Last BM: 6/12   Labs:   Recent Labs Lab 04/25/13 0540 04/26/13 0555 04/27/13 0505  NA 138 137 135  K 4.1 3.8 3.7  CL 100 101 99  CO2 26 25 24   BUN 44* 45* 45*   CREATININE 2.44* 2.09* 2.11*  CALCIUM 8.5 9.0 9.0  PHOS 4.5 4.7* 4.6  GLUCOSE 152* 153* 146*    CBG (last 3)   Recent Labs  04/26/13 1631 04/26/13 2036 04/27/13 0707  GLUCAP 128* 161* 137*    Scheduled Meds: . antiseptic oral rinse  15 mL Mouth Rinse BID  . aspirin EC  325 mg Oral Daily  . atorvastatin  40 mg Oral q1800  . carvedilol  12.5 mg Oral BID WC  . darbepoetin (ARANESP) injection - NON-DIALYSIS  100 mcg Subcutaneous Q Thu-1800  . enoxaparin (LOVENOX) injection  30 mg Subcutaneous Q24H  . famotidine  20 mg Oral Daily  . furosemide  80 mg Oral Daily  . hydrocerin   Topical BID  . insulin aspart  0-5 Units Subcutaneous QHS  . insulin aspart  0-9 Units Subcutaneous TID WC  . insulin detemir  10 Units Subcutaneous Daily  . mycophenolate  500 mg Oral BID  . omega-3 acid ethyl esters  1 g Oral Daily  . predniSONE  5 mg Oral BID  . pyridOXINE  200 mg Oral Daily  . senna-docusate  2 tablet Oral QHS  . vitamin B-12  250 mcg Oral Daily  . vitamin C  500 mg Oral Daily    Continuous Infusions:  none  Jarold Motto MS, RD,  LDN Pager: [phone] After-hours pager: [phone]

## 2013-04-27 NOTE — Progress Notes (Signed)
Nursing Note: Called to room by pt as his left elbow was bleeding,cleansed and applied transparent dressing. After a short while site bled more and bled thru dressing. Removed dressing,cleaned site and applied  dressing,4x4 and wrapped w/ kerlix.Re-checked again shortly after and no further bleeding noted.wbb

## 2013-04-27 NOTE — Progress Notes (Signed)
Patient ID: [name], male   DOB: [birth year], 67 y.o.   MRN: [id number] Subjective/Complaints: [name] is a 67 y.o. right-handed male with history of renal transplant, ICD/pacemaker, systolic congestive heart failure, diabetes mellitus with peripheral neuropathy and chronic right foot drop. Patient with recent low back surgery 2 weeks ago for L3 decompression discectomy brought to the emergency room 04/09/2013 with altered mental status, mild shortness of breath. Patient noted to have low-grade fever with evidence of RLE cellulitis. He was started on IV antibiotics for SIRS. V/Q scan with low suspicion of PE. EKG paced rhythm with ST elevation in the inferior leads and 2D echo with evidence of large pleural effusion. LHC was consulted and felt ST changes non-specific and patient with evidence of volume overload without failure. He was started on colchicine and repeat echo Nephrology consulted for input on renal failure and recommended increasing diuretics to help with edema as well as effusion. Renal U/S without hydronephrosis. Was started on IV iron with aranesp for anemia of chronic disease.  CT of right knee showed no evidence of osteomyelitis there is a small joint effusion with subcutaneous edema diffusely about the knee likely due to possible cellulitis. Venous Doppler studies lower extremity showed no signs of DVT. CT lumbar spine showed interval postsurgical changes lumbar L3-4. An epidural air-fluid collection extending inferiorly from the disc space behind the L4 vertebral body and exerts mass effect on the thecal sac. Films evaluated by Dr. Gerlene Fee who felt that xray's with post op changes and doubtedt infection. He did develop diarrhea with worsening of renal status therefore colchicine discontinued and diuretics decreased back to lower dosage. Repeat echo 06/03 with slight decrease in size of effusion. 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 Lovell Sheehan. Back and hip pain improved  No new issues.   Review of Systems  Gastrointestinal: Positive for constipation.  All other systems reviewed and are negative.    Objective: Vital Signs: Blood pressure 146/72, pulse 72, temperature 97.8 F (36.6 C), temperature source Oral, resp. rate 16, height 6\' 1"  (1.854 m), weight 71.3 kg (157 lb 3 oz), SpO2 97.00%. No results found. Results for orders placed during the hospital encounter of 04/23/13 (from the past 72 hour(s))  GLUCOSE, CAPILLARY     Status: Abnormal   Collection Time    04/24/13 11:22 AM      Result Value Range   Glucose-Capillary 135 (*) 70 - 99 mg/dL   Comment 1 Notify RN    GLUCOSE, CAPILLARY     Status: Abnormal   Collection Time    04/24/13  4:34 PM      Result Value Range   Glucose-Capillary 172 (*) 70 - 99 mg/dL   Comment 1 Notify RN    GLUCOSE, CAPILLARY     Status: Abnormal   Collection Time    04/24/13  8:31 PM      Result Value Range   Glucose-Capillary 162 (*) 70 - 99 mg/dL   Comment 1 Notify RN    RENAL FUNCTION PANEL     Status: Abnormal   Collection Time    04/25/13  5:40 AM      Result Value Range   Sodium 138  135 - 145 mEq/L   Potassium 4.1  3.5 - 5.1 mEq/L   Chloride 100  96 - 112 mEq/L   CO2 26  19 - 32 mEq/L   Glucose, Bld 152 (*) 70 -  99 mg/dL   BUN 44 (*) 6 - 23 mg/dL   Creatinine, Ser 4.09 (*) 0.50 - 1.35 mg/dL   Calcium 8.5  8.4 - 81.1 mg/dL   Phosphorus 4.5  2.3 - 4.6 mg/dL   Albumin 2.7 (*) 3.5 - 5.2 g/dL   GFR calc non Af Amer 26 (*) >90 mL/min   GFR calc Af Amer 31 (*) >90 mL/min   Comment:            The eGFR has been calculated     using the CKD EPI equation.     This calculation has not been     validated in all clinical     situations.     eGFR's persistently     <90 mL/min signify     possible Chronic Kidney Disease.  GLUCOSE, CAPILLARY     Status: Abnormal   Collection Time    04/25/13  7:00 AM      Result Value Range   Glucose-Capillary  123 (*) 70 - 99 mg/dL   Comment 1 Notify RN    GLUCOSE, CAPILLARY     Status: Abnormal   Collection Time    04/25/13 11:26 AM      Result Value Range   Glucose-Capillary 113 (*) 70 - 99 mg/dL   Comment 1 Notify RN    GLUCOSE, CAPILLARY     Status: Abnormal   Collection Time    04/25/13  4:57 PM      Result Value Range   Glucose-Capillary 127 (*) 70 - 99 mg/dL  GLUCOSE, CAPILLARY     Status: Abnormal   Collection Time    04/25/13  9:16 PM      Result Value Range   Glucose-Capillary 159 (*) 70 - 99 mg/dL  RENAL FUNCTION PANEL     Status: Abnormal   Collection Time    04/26/13  5:55 AM      Result Value Range   Sodium 137  135 - 145 mEq/L   Potassium 3.8  3.5 - 5.1 mEq/L   Chloride 101  96 - 112 mEq/L   CO2 25  19 - 32 mEq/L   Glucose, Bld 153 (*) 70 - 99 mg/dL   BUN 45 (*) 6 - 23 mg/dL   Creatinine, Ser 9.14 (*) 0.50 - 1.35 mg/dL   Calcium 9.0  8.4 - 78.2 mg/dL   Phosphorus 4.7 (*) 2.3 - 4.6 mg/dL   Albumin 2.7 (*) 3.5 - 5.2 g/dL   GFR calc non Af Amer 32 (*) >90 mL/min   GFR calc Af Amer 37 (*) >90 mL/min   Comment:            The eGFR has been calculated     using the CKD EPI equation.     This calculation has not been     validated in all clinical     situations.     eGFR's persistently     <90 mL/min signify     possible Chronic Kidney Disease.  GLUCOSE, CAPILLARY     Status: Abnormal   Collection Time    04/26/13  7:32 AM      Result Value Range   Glucose-Capillary 117 (*) 70 - 99 mg/dL  GLUCOSE, CAPILLARY     Status: None   Collection Time    04/26/13 11:41 AM      Result Value Range   Glucose-Capillary 87  70 - 99 mg/dL   Comment 1 Notify RN    GLUCOSE,  CAPILLARY     Status: Abnormal   Collection Time    04/26/13  4:31 PM      Result Value Range   Glucose-Capillary 128 (*) 70 - 99 mg/dL   Comment 1 Notify RN    GLUCOSE, CAPILLARY     Status: Abnormal   Collection Time    04/26/13  8:36 PM      Result Value Range   Glucose-Capillary 161 (*) 70 - 99  mg/dL  RENAL FUNCTION PANEL     Status: Abnormal   Collection Time    04/27/13  5:05 AM      Result Value Range   Sodium 135  135 - 145 mEq/L   Potassium 3.7  3.5 - 5.1 mEq/L   Chloride 99  96 - 112 mEq/L   CO2 24  19 - 32 mEq/L   Glucose, Bld 146 (*) 70 - 99 mg/dL   BUN 45 (*) 6 - 23 mg/dL   Creatinine, Ser 1.61 (*) 0.50 - 1.35 mg/dL   Calcium 9.0  8.4 - 09.6 mg/dL   Phosphorus 4.6  2.3 - 4.6 mg/dL   Albumin 2.7 (*) 3.5 - 5.2 g/dL   GFR calc non Af Amer 31 (*) >90 mL/min   GFR calc Af Amer 36 (*) >90 mL/min   Comment:            The eGFR has been calculated     using the CKD EPI equation.     This calculation has not been     validated in all clinical     situations.     eGFR's persistently     <90 mL/min signify     possible Chronic Kidney Disease.  GLUCOSE, CAPILLARY     Status: Abnormal   Collection Time    04/27/13  7:07 AM      Result Value Range   Glucose-Capillary 137 (*) 70 - 99 mg/dL   Comment 1 Notify RN      Nursing note and vitals reviewed.  Constitutional: He is oriented to person, place, and time. He appears well-developed and well-nourished.  HENT:  Head: Normocephalic and atraumatic.  Eyes: Pupils are equal, round, and reactive to light.  Cardiovascular: Normal rate and regular rhythm.  Pulmonary/Chest: Effort normal.  Abdominal: Soft. Bowel sounds are normal.  Musculoskeletal: He exhibits edema (1+ edema right foot).  Neurological: He is alert and oriented to person, place, and time.  Skin: Skin is warm. Rash (Multiple skin lesions with eccymotic areas exposed areas as well as scars from healed abrasions. ) noted. Multiple  Seborrheic  keratoses Back incision with OpSite in place.  Upper body strength 4/5 in bilateral deltoid, biceps, triceps, grip  Right lower extremity 4 minus hip flexor knee extensor tr to 1/5 in right ankle dorsiflexor , 2-3/5 APF Left lower extremity 3 minus hip flexor knee extensor and ankle dorsiflexor plantar flexor  Sensory  mildly reduced in both feet     Assessment/Plan: 1. 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 Physiatrist is providing close team supervision and 24 hour management of active medical problems listed below. Physiatrist and rehab team continue to assess barriers to discharge/monitor patient progress toward functional and medical goals.     FIM: FIM - Bathing Bathing Steps Patient Completed: Chest;Right Arm;Left Arm;Abdomen;Front perineal area;Buttocks;Right upper leg;Left upper leg;Right lower leg (including foot);Left lower leg (including foot) Bathing:  4: Steadying assist  FIM - Upper Body Dressing/Undressing Upper body dressing/undressing steps patient completed: Thread/unthread right sleeve of pullover shirt/dresss;Thread/unthread left sleeve of pullover shirt/dress;Put head through opening of pull over shirt/dress;Pull shirt over trunk Upper body dressing/undressing: 5: Set-up assist to: Obtain clothing/put away FIM - Lower Body Dressing/Undressing Lower body dressing/undressing steps patient completed: Thread/unthread right underwear leg;Thread/unthread left underwear leg;Pull underwear up/down;Thread/unthread right pants leg;Thread/unthread left pants leg;Pull pants up/down Lower body dressing/undressing: 4: Min-Patient completed 75 plus % of tasks  FIM - Toileting Toileting steps completed by patient: Performs perineal hygiene Toileting Assistive Devices: Grab bar or rail for support (walker in front) Toileting: 5: Supervision: Safety issues/verbal cues  FIM - Diplomatic Services operational officer Devices: Elevated toilet seat Toilet Transfers: 5-To toilet/BSC: Supervision (verbal cues/safety issues)  FIM - Banker Devices: Therapist, occupational: 4: Supine > Sit: Min A (steadying  Pt. > 75%/lift 1 leg);4: Bed > Chair or W/C: Min A (steadying Pt. > 75%)  FIM - Locomotion: Wheelchair Distance: 150 Locomotion: Wheelchair: 5: Travels 150 ft or more: maneuvers on rugs and over door sills with supervision, cueing or coaxing FIM - Locomotion: Ambulation Locomotion: Ambulation Assistive Devices: Walker - Rolling;Other (comment) (R AFO) Ambulation/Gait Assistance: 4: Min guard Locomotion: Ambulation: 2: Travels 50 - 149 ft with supervision/safety issues  Comprehension Comprehension Mode: Auditory Comprehension: 5-Understands complex 90% of the time/Cues < 10% of the time  Expression Expression Mode: Verbal Expression: 6-Expresses complex ideas: With extra time/assistive device  Social Interaction Social Interaction: 6-Interacts appropriately with others with medication or extra time (anti-anxiety, antidepressant).  Problem Solving Problem Solving: 5-Solves basic 90% of the time/requires cueing < 10% of the time  Memory Memory: 5-Recognizes or recalls 90% of the time/requires cueing < 10% of the time  Medical Problem List and Plan:  1. DVT Prophylaxis/Anticoagulation: Pharmaceutical: Lovenox  2. Pain Management: prn oxycodone effective.  3. Mood: Motivated. Is feeling better past second surgery. Will have LCSW follow for evaluation.  4. Neuropsych: This patient is capable of making decisions on his/her own behalf.  5. 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 Renal status improving with decrease in lasix and resolution of diarrhea.  6. Dm type 2: Continue levemir --reasonable control at present 7. Large pericardial effusion without tamponade: For 2D echo in several weeks past discharge.  8. LE cellulitis: resolved. Diarrhea improving off antibiotics. 9. 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 Fluid overload improved. Continue coreg, lasix 80 mg/day,   -weight steady at 71 to 72 kg 10. HTN: will monitor with bid checks. Continue lasix and coreg.   11. Anemia of chronic disease: treated with IV iron and on aranesp  12. Constipation with nausea:   -had bm 6/10---  -follow up with RN today     LOS (Days) 4 A FACE TO FACE EVALUATION WAS PERFORMED  [name] T 04/27/2013, 8:39 AM

## 2013-04-28 ENCOUNTER — Inpatient Hospital Stay (HOSPITAL_COMMUNITY): Payer: PRIVATE HEALTH INSURANCE

## 2013-04-28 ENCOUNTER — Inpatient Hospital Stay (HOSPITAL_COMMUNITY): Payer: PRIVATE HEALTH INSURANCE | Admitting: Physical Therapy

## 2013-04-28 ENCOUNTER — Inpatient Hospital Stay (HOSPITAL_COMMUNITY): Payer: PRIVATE HEALTH INSURANCE | Admitting: Occupational Therapy

## 2013-04-28 LAB — RENAL FUNCTION PANEL
BUN: 49 mg/dL — ABNORMAL HIGH (ref 6–23)
CO2: 22 mEq/L (ref 19–32)
GFR calc Af Amer: 35 mL/min — ABNORMAL LOW (ref >90)
Glucose, Bld: 150 mg/dL — ABNORMAL HIGH (ref 70–99)
Potassium: 4.4 mEq/L (ref 3.5–5.1)
Sodium: 136 mEq/L (ref 135–145)

## 2013-04-28 LAB — GLUCOSE, CAPILLARY
Glucose-Capillary: 139 mg/dL — ABNORMAL HIGH (ref 70–99)
Glucose-Capillary: 179 mg/dL — ABNORMAL HIGH (ref 70–99)

## 2013-04-28 MED ORDER — ALPRAZOLAM 0.25 MG PO TABS
0.2500 mg | ORAL_TABLET | Freq: Two times a day (BID) | ORAL | Status: DC | PRN
  Administered 2013-04-28 – 2013-05-03 (×7): 0.25 mg via ORAL
  Filled 2013-04-28 (×8): qty 1

## 2013-04-28 MED ORDER — SORBITOL 70 % SOLN
60.0000 mL | Status: AC
  Administered 2013-04-28: 60 mL via ORAL
  Filled 2013-04-28: qty 60

## 2013-04-28 NOTE — Progress Notes (Signed)
Patient ID: [name], male   DOB: 12/13/1945, 67 y.o.   MRN: [id number] Subjective/Complaints:   Left knee sore after riding on bike yesterday, constipated still  Review of Systems   All other systems reviewed and are negative.    Objective: Vital Signs: Blood pressure 170/72, pulse 80, temperature 98.2 F (36.8 C), temperature source Oral, resp. rate 18, height 6\' 1"  (1.854 m), weight 70 kg (154 lb 5.2 oz), SpO2 98.00%. No results found. Results for orders placed during the hospital encounter of 04/23/13 (from the past 72 hour(s))  GLUCOSE, CAPILLARY     Status: Abnormal   Collection Time    04/25/13 11:26 AM      Result Value Range   Glucose-Capillary 113 (*) 70 - 99 mg/dL   Comment 1 Notify RN    GLUCOSE, CAPILLARY     Status: Abnormal   Collection Time    04/25/13  4:57 PM      Result Value Range   Glucose-Capillary 127 (*) 70 - 99 mg/dL  GLUCOSE, CAPILLARY     Status: Abnormal   Collection Time    04/25/13  9:16 PM      Result Value Range   Glucose-Capillary 159 (*) 70 - 99 mg/dL  RENAL FUNCTION PANEL     Status: Abnormal   Collection Time    04/26/13  5:55 AM      Result Value Range   Sodium 137  135 - 145 mEq/L   Potassium 3.8  3.5 - 5.1 mEq/L   Chloride 101  96 - 112 mEq/L   CO2 25  19 - 32 mEq/L   Glucose, Bld 153 (*) 70 - 99 mg/dL   BUN 45 (*) 6 - 23 mg/dL   Creatinine, Ser 7.82 (*) 0.50 - 1.35 mg/dL   Calcium 9.0  8.4 - 95.6 mg/dL   Phosphorus 4.7 (*) 2.3 - 4.6 mg/dL   Albumin 2.7 (*) 3.5 - 5.2 g/dL   GFR calc non Af Amer 32 (*) >90 mL/min   GFR calc Af Amer 37 (*) >90 mL/min   Comment:            The eGFR has been calculated     using the CKD EPI equation.     This calculation has not been     validated in all clinical     situations.     eGFR's persistently     <90 mL/min signify     possible Chronic Kidney Disease.  GLUCOSE, CAPILLARY     Status: Abnormal   Collection Time    04/26/13  7:32 AM      Result Value Range   Glucose-Capillary  117 (*) 70 - 99 mg/dL  GLUCOSE, CAPILLARY     Status: None   Collection Time    04/26/13 11:41 AM      Result Value Range   Glucose-Capillary 87  70 - 99 mg/dL   Comment 1 Notify RN    GLUCOSE, CAPILLARY     Status: Abnormal   Collection Time    04/26/13  4:31 PM      Result Value Range   Glucose-Capillary 128 (*) 70 - 99 mg/dL   Comment 1 Notify RN    GLUCOSE, CAPILLARY     Status: Abnormal   Collection Time    04/26/13  8:36 PM      Result Value Range   Glucose-Capillary 161 (*) 70 - 99 mg/dL  RENAL FUNCTION PANEL     Status:  Abnormal   Collection Time    04/27/13  5:05 AM      Result Value Range   Sodium 135  135 - 145 mEq/L   Potassium 3.7  3.5 - 5.1 mEq/L   Chloride 99  96 - 112 mEq/L   CO2 24  19 - 32 mEq/L   Glucose, Bld 146 (*) 70 - 99 mg/dL   BUN 45 (*) 6 - 23 mg/dL   Creatinine, Ser 1.61 (*) 0.50 - 1.35 mg/dL   Calcium 9.0  8.4 - 09.6 mg/dL   Phosphorus 4.6  2.3 - 4.6 mg/dL   Albumin 2.7 (*) 3.5 - 5.2 g/dL   GFR calc non Af Amer 31 (*) >90 mL/min   GFR calc Af Amer 36 (*) >90 mL/min   Comment:            The eGFR has been calculated     using the CKD EPI equation.     This calculation has not been     validated in all clinical     situations.     eGFR's persistently     <90 mL/min signify     possible Chronic Kidney Disease.  GLUCOSE, CAPILLARY     Status: Abnormal   Collection Time    04/27/13  7:07 AM      Result Value Range   Glucose-Capillary 137 (*) 70 - 99 mg/dL   Comment 1 Notify RN    GLUCOSE, CAPILLARY     Status: Abnormal   Collection Time    04/27/13 11:33 AM      Result Value Range   Glucose-Capillary 121 (*) 70 - 99 mg/dL   Comment 1 Notify RN    GLUCOSE, CAPILLARY     Status: Abnormal   Collection Time    04/27/13  4:49 PM      Result Value Range   Glucose-Capillary 158 (*) 70 - 99 mg/dL  GLUCOSE, CAPILLARY     Status: Abnormal   Collection Time    04/27/13  9:33 PM      Result Value Range   Glucose-Capillary 180 (*) 70 - 99  mg/dL  RENAL FUNCTION PANEL     Status: Abnormal   Collection Time    04/28/13  5:50 AM      Result Value Range   Sodium 136  135 - 145 mEq/L   Potassium 4.4  3.5 - 5.1 mEq/L   Chloride 100  96 - 112 mEq/L   CO2 22  19 - 32 mEq/L   Glucose, Bld 150 (*) 70 - 99 mg/dL   BUN 49 (*) 6 - 23 mg/dL   Creatinine, Ser 0.45 (*) 0.50 - 1.35 mg/dL   Calcium 9.0  8.4 - 40.9 mg/dL   Phosphorus 5.4 (*) 2.3 - 4.6 mg/dL   Albumin 2.6 (*) 3.5 - 5.2 g/dL   GFR calc non Af Amer 31 (*) >90 mL/min   GFR calc Af Amer 35 (*) >90 mL/min   Comment:            The eGFR has been calculated     using the CKD EPI equation.     This calculation has not been     validated in all clinical     situations.     eGFR's persistently     <90 mL/min signify     possible Chronic Kidney Disease.  GLUCOSE, CAPILLARY     Status: Abnormal   Collection Time    04/28/13  7:06  AM      Result Value Range   Glucose-Capillary 139 (*) 70 - 99 mg/dL   Comment 1 Notify RN      Nursing note and vitals reviewed.  Constitutional: He is oriented to person, place, and time. He appears well-developed and well-nourished.  HENT:  Head: Normocephalic and atraumatic.  Eyes: Pupils are equal, round, and reactive to light.  Cardiovascular: Normal rate and regular rhythm.  Pulmonary/Chest: Effort normal.  Abdominal: Soft. Bowel sounds are normal.  Musculoskeletal: He exhibits edema (1+ edema right foot).  Left knee with some tenderness along quad tendon Neurological: He is alert and oriented to person, place, and time.  Skin: Skin is warm. Rash (Multiple skin lesions with eccymotic areas exposed areas as well as scars from healed abrasions. ) noted. Multiple  Seborrheic  keratoses Back incision with OpSite in place.  Upper body strength 4/5 in bilateral deltoid, biceps, triceps, grip  Right lower extremity 4 minus hip flexor knee extensor tr to 1/5 in right ankle dorsiflexor , 2-3/5 APF Left lower extremity 3 minus hip flexor knee  extensor and ankle dorsiflexor plantar flexor  Sensory mildly reduced in both feet     Assessment/Plan: 1. 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 Physiatrist is providing close team supervision and 24 hour management of active medical problems listed below. Physiatrist and rehab team continue to assess barriers to discharge/monitor patient progress toward functional and medical goals.     FIM: FIM - Bathing Bathing Steps Patient Completed: Chest;Right Arm;Left Arm;Abdomen;Front perineal area;Buttocks;Right upper leg;Left upper leg;Right lower leg (including foot);Left lower leg (including foot) Bathing: 4: Steadying assist  FIM - Upper Body Dressing/Undressing Upper body dressing/undressing steps patient completed: Thread/unthread right sleeve of pullover shirt/dresss;Thread/unthread left sleeve of pullover shirt/dress;Put head through opening of pull over shirt/dress;Pull shirt over trunk Upper body dressing/undressing: 5: Set-up assist to: Obtain clothing/put away FIM - Lower Body Dressing/Undressing Lower body dressing/undressing steps patient completed: Thread/unthread right underwear leg;Thread/unthread left underwear leg;Pull underwear up/down;Thread/unthread right pants leg;Thread/unthread left pants leg;Pull pants up/down Lower body dressing/undressing: 4: Min-Patient completed 75 plus % of tasks  FIM - Toileting Toileting steps completed by patient: Performs perineal hygiene Toileting Assistive Devices: Grab bar or rail for support (walker in front) Toileting: 5: Supervision: Safety issues/verbal cues  FIM - Diplomatic Services operational officer Devices: Elevated toilet seat Toilet Transfers: 5-To toilet/BSC: Supervision (verbal cues/safety issues)  FIM - Banker Devices:  Therapist, occupational: 4: Supine > Sit: Min A (steadying Pt. > 75%/lift 1 leg);4: Bed > Chair or W/C: Min A (steadying Pt. > 75%)  FIM - Locomotion: Wheelchair Distance: 150 Locomotion: Wheelchair: 5: Travels 150 ft or more: maneuvers on rugs and over door sills with supervision, cueing or coaxing FIM - Locomotion: Ambulation Locomotion: Ambulation Assistive Devices: Designer, industrial/product Ambulation/Gait Assistance: 4: Min guard Locomotion: Ambulation: 2: Travels 50 - 149 ft with minimal assistance (Pt.>75%)  Comprehension Comprehension Mode: Auditory Comprehension: 3-Understands basic 50 - 74% of the time/requires cueing 25 - 50%  of the time  Expression Expression Mode: Verbal Expression: 6-Expresses complex ideas: With extra time/assistive device  Social Interaction Social Interaction: 5-Interacts appropriately 90% of the time - Needs monitoring or encouragement for participation or interaction.  Problem Solving Problem Solving: 5-Solves basic 90% of the time/requires cueing < 10% of the time  Memory Memory: 6-More than reasonable amt  of time  Medical Problem List and Plan:  1. DVT Prophylaxis/Anticoagulation: Pharmaceutical: Lovenox  2. Pain Management: prn oxycodone effective.   -ice for mild quad tendon strain 3. Mood: Motivated. Is feeling better past second surgery. Will have LCSW follow for evaluation.  4. Neuropsych: This patient is capable of making decisions on his/her own behalf.  5. 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 Renal status improving with decrease in lasix and resolution of diarrhea.  6. Dm type 2: Continue levemir --reasonable control at present 7. Large pericardial effusion without tamponade: For 2D echo in several weeks past discharge.  8. LE cellulitis: resolved. Diarrhea improving off antibiotics. 9. 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 Fluid overload improved. Continue coreg, lasix 80 mg/day,   -weight steady at 71 to 72  kg 10. HTN: will monitor with bid checks. Continue lasix and coreg.  11. Anemia of chronic disease: treated with IV iron and on aranesp  12. Constipation with nausea:   -sorbitol today.      LOS (Days) 5 A FACE TO FACE EVALUATION WAS PERFORMED  SWARTZ,ZACHARY T 04/28/2013, 9:00 AM

## 2013-04-28 NOTE — Progress Notes (Signed)
Occupational Therapy Session Note  Patient Details  Name: [name] MRN: [id number] Date of Birth: 1945-12-26  Today's Date: 04/28/2013 Time: 0800-0845 Time Calculation (min): 45 min   Skilled Therapeutic Interventions: ADL-retraining with emphasis on use of assistive devices for lower body dressing, maintenance of back precautions, endurance, and transfers using AD.   Patient deferred bathing in shower due to time constraints and he elected to lightly bathe seated at sink side to allow sufficient time for grooming and oral hygiene.   Patient demo'd excellent safety awareness throughout session but requires extra time due to mild discomfort at left knee. Re-educated on use of sock aid and educated patient on use of reacher to doff socks.   Transfers in/out of w/c and bed were completed effectively with only steadying assist needed.     Therapy Documentation Precautions:  Precautions Precautions: Back;Fall Precaution Comments: left leg weaker than right leg.   Required Braces or Orthoses: Other Brace/Splint Other Brace/Splint: no back brace; Rt AFO Restrictions Weight Bearing Restrictions: No  Vital Signs: Therapy Vitals Temp: 98.2 F (36.8 C) Temp src: Oral Pulse Rate: 80 Resp: 18 BP: 170/72 mmHg Oxygen Therapy SpO2: 98 % O2 Device: None (Room air)  Pain: Pain Assessment Pain Assessment: 0-10 Pain Score:   2 Pain Type: Acute pain Pain Location: Knee Pain Orientation: Left Pain Descriptors / Indicators: Aching Pain Frequency: Intermittent Pain Onset: With Activity Patients Stated Pain Goal: 2 Pain Intervention(s): Medication (See eMAR);Repositioned  See FIM for current functional status  Therapy/Group: Individual Therapy  Georgeanne Nim 04/28/2013, 9:02 AM

## 2013-04-28 NOTE — Progress Notes (Addendum)
Occupational Therapy Session Note  Patient Details  Name: [name] MRN: [id number] Date of Birth: 30-Mar-1946  Today's Date: 04/28/2013 Time: 1330-1400 Time Calculation (min): 30 min  Short Term Goals: Week 1:  OT Short Term Goal 1 (Week 1): STG=LTG  Skilled Therapeutic Interventions:  Therapeutic activities with emphasis on upper extremity strengthening and endurance exercises using thera-band (orange, medium resistance).   Patient performed supervised eated exercises as follows: chest press, triceps extension, and shoulder int/ext rotation and shoulder flexion, 5-10 reps of each, 1-2 sets for initial training.   Patient reported distraction from episode of anxiety and constipation.   Constipation addressed by RN with laxative; patient seeking Rx of Xanax through provider.      Therapy Documentation Precautions:  Precautions Precautions: Back;Fall Precaution Comments: left leg weaker than right leg.   Required Braces or Orthoses: Other Brace/Splint Other Brace/Splint: no back brace; Rt AFO Restrictions Weight Bearing Restrictions: No  Pain: Pain Assessment Pain Assessment: 0-10 Pain Score:   4 Pain Type: Acute pain Pain Location: back Pain Orientation: Lower Pain Descriptors / Indicators: Aching Pain Frequency: Intermittent Pain Onset: Gradual Pain Intervention(s): Medication (See eMAR) (per patient request)  See FIM for current functional status  Therapy/Group: Individual Therapy  Georgeanne Nim 04/28/2013, 3:00 PM

## 2013-04-28 NOTE — Progress Notes (Signed)
Occupational Therapy Session Note  Patient Details  Name: JOHNE BUCKLE MRN: [id number] Date of Birth: October 22, 1946  Today's Date: 04/28/2013 Time: 1400-1500 Time Calculation (min): 60 min  Skilled Therapeutic Interventions/Progress Updates:    Pt participated in TAG group with focus on functional mobility, use of AE/DME, and adherence to back precautions.  Pt overall min guard assist for all tasks including sit to stand from the wheelchair.  Pt able to use the walker to step over 5 inch barrier to simulated shower edge.  Utilized reacher for retrieving items from the floor as well.  Attempted short distance mobility without walker which was much more difficult for pt and more fatiguing, still with only min facilitation from therapist.      Therapy Documentation Precautions:  Precautions Precautions: Back;Fall Precaution Comments: left leg weaker than right leg.   Required Braces or Orthoses: Other Brace/Splint Other Brace/Splint: no back brace; Rt AFO Restrictions Weight Bearing Restrictions: No  Vital Signs: Therapy Vitals Temp: 98 F (36.7 C) Temp src: Oral Pulse Rate: 79 Resp: 18 BP: 179/82 mmHg Oxygen Therapy SpO2: 97 % O2 Device: None (Room air) Pain: Pain Assessment Pain Assessment: 0-10 Pain Score:  (better) Pain Type: Acute pain Pain Location: Back Pain Orientation: Lower Pain Descriptors / Indicators: Aching Pain Frequency: Intermittent Pain Onset: Gradual Pain Intervention(s): Medication (See eMAR) (per patient request)   See FIM for current functional status  Therapy/Group: Group Therapy  Jilda Kress OTR/L 04/28/2013, 3:45 PM

## 2013-04-28 NOTE — Progress Notes (Signed)
Physical Therapy Session Note  Patient Details  Name: LARANCE RATLEDGE MRN: [id number] Date of Birth: 02/20/1946  Today's Date: 04/28/2013 Time: 0900-1000 Time Calculation (min): 60 min  Therapy Documentation Precautions:  Precautions Precautions: Back;Fall Precaution Comments: left leg weaker than right leg.   Required Braces or Orthoses: Other Brace/Splint Other Brace/Splint: no back brace; Rt AFO Restrictions Weight Bearing Restrictions: No  Pain: Left peripatellar area with ttp especially at undersurface of medial patella. Nursing notified.   Gait Training:(15') using RW 3 x 50' S/Mod-I. No LOB noted. Therapeutic Activity:(15') Transfer Training and Bed Mobility S/Mod-I Wheelchair Management:(30') propelling wheelchair on flat surfaces indoors 2 x 200' with L LE having difficulties advancing, so patient using hands on occasions to manually advance.  Outdoors on brick patio and sidewalk with min-A for hills but S/Mod-I on flat x 100'  See FIM for current functional status  Therapy/Group: Individual Therapy  Rex Kras 04/28/2013, 9:16 AM

## 2013-04-29 ENCOUNTER — Inpatient Hospital Stay (HOSPITAL_COMMUNITY): Payer: PRIVATE HEALTH INSURANCE | Admitting: Occupational Therapy

## 2013-04-29 LAB — RENAL FUNCTION PANEL
Albumin: 2.7 g/dL — ABNORMAL LOW (ref 3.5–5.2)
CO2: 24 mEq/L (ref 19–32)
Calcium: 8.7 mg/dL (ref 8.4–10.5)
Creatinine, Ser: 2.22 mg/dL — ABNORMAL HIGH (ref 0.50–1.35)
GFR calc Af Amer: 34 mL/min — ABNORMAL LOW (ref >90)
GFR calc non Af Amer: 30 mL/min — ABNORMAL LOW (ref >90)
Sodium: 134 mEq/L — ABNORMAL LOW (ref 135–145)

## 2013-04-29 LAB — GLUCOSE, CAPILLARY
Glucose-Capillary: 250 mg/dL — ABNORMAL HIGH (ref 70–99)
Glucose-Capillary: 187 mg/dL — ABNORMAL HIGH (ref 70–99)

## 2013-04-29 NOTE — Progress Notes (Signed)
Occupational Therapy Session Note  Patient Details  Name: [name] MRN: [id number] Date of Birth: 1946-10-06  Today's Date: 04/29/2013 Time: 1115-1200 Time Calculation (min): 45 min  Short Term Goals: Week 1:  OT Short Term Goal 1 (Week 1): STG=LTG  Skilled Therapeutic Interventions/Progress Updates:    Pt seen for ADL retraining with focus on LB dressing with AE secondary to back precautions.  Pt reports already bathing and dressing prior to session and only requiring assistance with socks.  Pt demonstrated understanding of use of sock aid but continues to have difficulty fully donning socks.  Educated on getting toes to end of sock prior to pulling up on strings to increase fit.  Pt required physical assistance with shoes this session secondary to pain in Lt knee, despite attempts to use long handed shoe horn.  Ambulated approx 50 feetx2 with RW and close supervision with 1 rest break.  Pt reports increased pain in Lt knee, but not wanting any meds.  Pt returned to recliner with ice pack on Rt knee.  Therapy Documentation Precautions:  Precautions Precautions: Back;Fall Precaution Comments: left leg weaker than right leg.   Required Braces or Orthoses: Other Brace/Splint Other Brace/Splint: no back brace; Rt AFO Restrictions Weight Bearing Restrictions: No Pain: Pain Assessment Pain Assessment: No/denies pain Pain Score: 0-No pain  See FIM for current functional status  Therapy/Group: Individual Therapy  Leonette Monarch 04/29/2013, 12:00 PM

## 2013-04-29 NOTE — Progress Notes (Signed)
Patient ID: [name], male   DOB: 01-04-1946, 67 y.o.   MRN: [id number] Subjective/Complaints:   Had a bm yesterday. Left knee better  Review of Systems   All other systems reviewed and are negative.    Objective: Vital Signs: Blood pressure 133/76, pulse 76, temperature 98.8 F (37.1 C), temperature source Oral, resp. rate 18, height 6\' 1"  (1.854 m), weight 70 kg (154 lb 5.2 oz), SpO2 97.00%. No results found. Results for orders placed during the hospital encounter of 04/23/13 (from the past 72 hour(s))  GLUCOSE, CAPILLARY     Status: None   Collection Time    04/26/13 11:41 AM      Result Value Range   Glucose-Capillary 87  70 - 99 mg/dL   Comment 1 Notify RN    GLUCOSE, CAPILLARY     Status: Abnormal   Collection Time    04/26/13  4:31 PM      Result Value Range   Glucose-Capillary 128 (*) 70 - 99 mg/dL   Comment 1 Notify RN    GLUCOSE, CAPILLARY     Status: Abnormal   Collection Time    04/26/13  8:36 PM      Result Value Range   Glucose-Capillary 161 (*) 70 - 99 mg/dL  RENAL FUNCTION PANEL     Status: Abnormal   Collection Time    04/27/13  5:05 AM      Result Value Range   Sodium 135  135 - 145 mEq/L   Potassium 3.7  3.5 - 5.1 mEq/L   Chloride 99  96 - 112 mEq/L   CO2 24  19 - 32 mEq/L   Glucose, Bld 146 (*) 70 - 99 mg/dL   BUN 45 (*) 6 - 23 mg/dL   Creatinine, Ser 3.08 (*) 0.50 - 1.35 mg/dL   Calcium 9.0  8.4 - 65.7 mg/dL   Phosphorus 4.6  2.3 - 4.6 mg/dL   Albumin 2.7 (*) 3.5 - 5.2 g/dL   GFR calc non Af Amer 31 (*) >90 mL/min   GFR calc Af Amer 36 (*) >90 mL/min   Comment:            The eGFR has been calculated     using the CKD EPI equation.     This calculation has not been     validated in all clinical     situations.     eGFR's persistently     <90 mL/min signify     possible Chronic Kidney Disease.  GLUCOSE, CAPILLARY     Status: Abnormal   Collection Time    04/27/13  7:07 AM      Result Value Range   Glucose-Capillary 137 (*) 70 - 99  mg/dL   Comment 1 Notify RN    GLUCOSE, CAPILLARY     Status: Abnormal   Collection Time    04/27/13 11:33 AM      Result Value Range   Glucose-Capillary 121 (*) 70 - 99 mg/dL   Comment 1 Notify RN    GLUCOSE, CAPILLARY     Status: Abnormal   Collection Time    04/27/13  4:49 PM      Result Value Range   Glucose-Capillary 158 (*) 70 - 99 mg/dL  GLUCOSE, CAPILLARY     Status: Abnormal   Collection Time    04/27/13  9:33 PM      Result Value Range   Glucose-Capillary 180 (*) 70 - 99 mg/dL  RENAL FUNCTION PANEL  Status: Abnormal   Collection Time    04/28/13  5:50 AM      Result Value Range   Sodium 136  135 - 145 mEq/L   Potassium 4.4  3.5 - 5.1 mEq/L   Chloride 100  96 - 112 mEq/L   CO2 22  19 - 32 mEq/L   Glucose, Bld 150 (*) 70 - 99 mg/dL   BUN 49 (*) 6 - 23 mg/dL   Creatinine, Ser 1.61 (*) 0.50 - 1.35 mg/dL   Calcium 9.0  8.4 - 09.6 mg/dL   Phosphorus 5.4 (*) 2.3 - 4.6 mg/dL   Albumin 2.6 (*) 3.5 - 5.2 g/dL   GFR calc non Af Amer 31 (*) >90 mL/min   GFR calc Af Amer 35 (*) >90 mL/min   Comment:            The eGFR has been calculated     using the CKD EPI equation.     This calculation has not been     validated in all clinical     situations.     eGFR's persistently     <90 mL/min signify     possible Chronic Kidney Disease.  GLUCOSE, CAPILLARY     Status: Abnormal   Collection Time    04/28/13  7:06 AM      Result Value Range   Glucose-Capillary 139 (*) 70 - 99 mg/dL   Comment 1 Notify RN    GLUCOSE, CAPILLARY     Status: Abnormal   Collection Time    04/28/13 11:37 AM      Result Value Range   Glucose-Capillary 139 (*) 70 - 99 mg/dL   Comment 1 Notify RN    GLUCOSE, CAPILLARY     Status: Abnormal   Collection Time    04/28/13  4:36 PM      Result Value Range   Glucose-Capillary 179 (*) 70 - 99 mg/dL   Comment 1 Notify RN    GLUCOSE, CAPILLARY     Status: Abnormal   Collection Time    04/28/13  9:17 PM      Result Value Range   Glucose-Capillary  159 (*) 70 - 99 mg/dL  RENAL FUNCTION PANEL     Status: Abnormal   Collection Time    04/29/13  4:25 AM      Result Value Range   Sodium 134 (*) 135 - 145 mEq/L   Potassium 3.8  3.5 - 5.1 mEq/L   Chloride 99  96 - 112 mEq/L   CO2 24  19 - 32 mEq/L   Glucose, Bld 180 (*) 70 - 99 mg/dL   BUN 49 (*) 6 - 23 mg/dL   Creatinine, Ser 0.45 (*) 0.50 - 1.35 mg/dL   Calcium 8.7  8.4 - 40.9 mg/dL   Phosphorus 4.6  2.3 - 4.6 mg/dL   Albumin 2.7 (*) 3.5 - 5.2 g/dL   GFR calc non Af Amer 30 (*) >90 mL/min   GFR calc Af Amer 34 (*) >90 mL/min   Comment:            The eGFR has been calculated     using the CKD EPI equation.     This calculation has not been     validated in all clinical     situations.     eGFR's persistently     <90 mL/min signify     possible Chronic Kidney Disease.  GLUCOSE, CAPILLARY     Status: Abnormal  Collection Time    04/29/13  7:14 AM      Result Value Range   Glucose-Capillary 171 (*) 70 - 99 mg/dL   Comment 1 Notify RN      Nursing note and vitals reviewed.  Constitutional: He is oriented to person, place, and time. He appears well-developed and well-nourished.  HENT:  Head: Normocephalic and atraumatic.  Eyes: Pupils are equal, round, and reactive to light.  Cardiovascular: Normal rate and regular rhythm.  Pulmonary/Chest: Effort normal.  Abdominal: Soft. Bowel sounds are normal.  Musculoskeletal: He exhibits edema (1+ edema right foot).  Left knee with some tenderness along quad tendon Neurological: He is alert and oriented to person, place, and time.  Skin: Skin is warm. Rash (Multiple skin lesions with eccymotic areas exposed areas as well as scars from healed abrasions. ) noted. Multiple  Seborrheic  keratoses Back incision with OpSite in place.  Upper body strength 4/5 in bilateral deltoid, biceps, triceps, grip  Right lower extremity 4 minus hip flexor knee extensor tr to 1/5 in right ankle dorsiflexor , 2-3/5 APF Left lower extremity 3 minus hip  flexor knee extensor and ankle dorsiflexor plantar flexor  Sensory mildly reduced in both feet     Assessment/Plan: 1. 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 Physiatrist is providing close team supervision and 24 hour management of active medical problems listed below. Physiatrist and rehab team continue to assess barriers to discharge/monitor patient progress toward functional and medical goals.     FIM: FIM - Bathing Bathing Steps Patient Completed: Chest;Right Arm;Left Arm;Abdomen Bathing: 4: Steadying assist  FIM - Upper Body Dressing/Undressing Upper body dressing/undressing steps patient completed: Thread/unthread right sleeve of pullover shirt/dresss;Thread/unthread left sleeve of pullover shirt/dress;Put head through opening of pull over shirt/dress;Pull shirt over trunk Upper body dressing/undressing: 5: Set-up assist to: Obtain clothing/put away FIM - Lower Body Dressing/Undressing Lower body dressing/undressing steps patient completed: Thread/unthread right underwear leg;Thread/unthread left underwear leg;Pull underwear up/down;Thread/unthread right pants leg;Thread/unthread left pants leg;Pull pants up/down;Fasten/unfasten pants;Don/Doff right sock;Don/Doff left sock Lower body dressing/undressing: 4: Min-Patient completed 75 plus % of tasks  FIM - Toileting Toileting steps completed by patient: Adjust clothing prior to toileting;Performs perineal hygiene;Adjust clothing after toileting Toileting Assistive Devices: Grab bar or rail for support Toileting: 5: Supervision: Safety issues/verbal cues  FIM - Diplomatic Services operational officer Devices: Best boy Transfers: 5-To toilet/BSC: Supervision (verbal cues/safety issues);5-From toilet/BSC: Supervision (verbal cues/safety issues)  FIM - Network engineer Assistive Devices: Arm rests;Walker Bed/Chair Transfer: 5: Supine > Sit: Supervision (verbal cues/safety issues);4: Bed > Chair or W/C: Min A (steadying Pt. > 75%)  FIM - Locomotion: Wheelchair Distance: 150 Locomotion: Wheelchair: 5: Travels 150 ft or more: maneuvers on rugs and over door sills with supervision, cueing or coaxing FIM - Locomotion: Ambulation Locomotion: Ambulation Assistive Devices: Designer, industrial/product Ambulation/Gait Assistance: 4: Min guard Locomotion: Ambulation: 2: Travels 50 - 149 ft with minimal assistance (Pt.>75%)  Comprehension Comprehension Mode: Auditory Comprehension: 5-Follows basic conversation/direction: With extra time/assistive device  Expression Expression Mode: Verbal Expression: 6-Expresses complex ideas: With extra time/assistive device  Social Interaction Social Interaction: 5-Interacts appropriately 90% of the time - Needs monitoring or encouragement for participation or interaction.  Problem Solving Problem Solving: 5-Solves basic 90% of the time/requires cueing < 10% of the time  Memory Memory: 6-More than reasonable amt of time  Medical Problem  List and Plan:  1. DVT Prophylaxis/Anticoagulation: Pharmaceutical: Lovenox  2. Pain Management: prn oxycodone effective.   -ice for mild quad tendon strain---improved 3. Mood: Motivated. Is feeling better past second surgery. Will have LCSW follow for evaluation.  4. Neuropsych: This patient is capable of making decisions on his/her own behalf.  5. 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 Renal status improving with decrease in lasix and resolution of diarrhea.   -labs near baseline. Stop daily checks 6. Dm type 2: Continue levemir --reasonable control at present 7. Large pericardial effusion without tamponade: For 2D echo in several weeks past discharge.  8. LE cellulitis: resolved. Diarrhea improving off antibiotics. 9. 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 Fluid overload improved. Continue coreg, lasix 80 mg/day,   -weight steady at 71 to 72 kg 10. HTN: will monitor with bid checks. Continue lasix and coreg.  11. Anemia of chronic disease: treated with IV iron and on aranesp  12. Constipation with nausea:   -sorbitol with good results---try to stay ahead with maintenance program    LOS (Days) 6 A FACE TO FACE EVALUATION WAS PERFORMED  Tremon Sainvil T 04/29/2013, 8:51 AM

## 2013-04-30 ENCOUNTER — Inpatient Hospital Stay (HOSPITAL_COMMUNITY): Payer: PRIVATE HEALTH INSURANCE | Admitting: *Deleted

## 2013-04-30 ENCOUNTER — Inpatient Hospital Stay (HOSPITAL_COMMUNITY): Payer: PRIVATE HEALTH INSURANCE | Admitting: Physical Therapy

## 2013-04-30 ENCOUNTER — Inpatient Hospital Stay (HOSPITAL_COMMUNITY): Payer: PRIVATE HEALTH INSURANCE | Admitting: Occupational Therapy

## 2013-04-30 ENCOUNTER — Inpatient Hospital Stay (HOSPITAL_COMMUNITY): Payer: PRIVATE HEALTH INSURANCE

## 2013-04-30 DIAGNOSIS — R5381 Other malaise: Secondary | ICD-10-CM

## 2013-04-30 LAB — CREATININE, SERUM
Creatinine, Ser: 2.07 mg/dL — ABNORMAL HIGH (ref 0.50–1.35)
GFR calc Af Amer: 37 mL/min — ABNORMAL LOW (ref >90)
GFR calc non Af Amer: 32 mL/min — ABNORMAL LOW (ref >90)

## 2013-04-30 LAB — GLUCOSE, CAPILLARY
Glucose-Capillary: 198 mg/dL — ABNORMAL HIGH (ref 70–99)
Glucose-Capillary: 157 mg/dL — ABNORMAL HIGH (ref 70–99)

## 2013-04-30 MED ORDER — GLUCERNA SHAKE PO LIQD
237.0000 mL | Freq: Three times a day (TID) | ORAL | Status: DC
  Administered 2013-04-30 – 2013-05-01 (×4): 237 mL via ORAL
  Filled 2013-04-30: qty 237

## 2013-04-30 NOTE — Progress Notes (Signed)
Physical Therapy Session Note  Patient Details  Name: MICHAIL BOYTE MRN: [id number] Date of Birth: 1946/02/02  Today's Date: 04/30/2013 Time: 4782-9562 Time Calculation (min):  30 min  Short Term Goals: Week 1:  PT Short Term Goal 1 (Week 1): =long term goals  Skilled Therapeutic Interventions/Progress Updates:    Ambulation x 110' with RW and min-guard assist facilitation to promote Lt. Knee proprioception in space to decrease Lt. Knee hyperextension. Wheelchair propulsion x 80' 160' for strengthening and endurance. Step up/down with RW support for bil. LEs strengthenig. Cues for slow concentric/eccentric control of quad and glute contractions, cues for decreased UE reliance.   Therapy Documentation Precautions:  Precautions Precautions: Back;Fall Precaution Comments: left leg weaker than right leg.   Required Braces or Orthoses: Other Brace/Splint Other Brace/Splint: no back brace; Rt AFO Restrictions Weight Bearing Restrictions: No Pain: 3/10, Pt called for pain medication  See FIM for current functional status  Therapy/Group: Individual Therapy  Wilhemina Bonito 04/30/2013, 1:34 PM

## 2013-04-30 NOTE — Progress Notes (Signed)
Patient ID: [name], male   DOB: 09-01-1946, 67 y.o.   MRN: [id number] Subjective/Complaints:   Left knee feeling well. Slept well last night. Denies pain. Asks questions about brace again.  Review of Systems   All other systems reviewed and are negative.    Objective: Vital Signs: Blood pressure 136/86, pulse 74, temperature 97.3 F (36.3 C), temperature source Oral, resp. rate 17, height 6\' 1"  (1.854 m), weight 72.2 kg (159 lb 2.8 oz), SpO2 97.00%. No results found. Results for orders placed during the hospital encounter of 04/23/13 (from the past 72 hour(s))  GLUCOSE, CAPILLARY     Status: Abnormal   Collection Time    04/27/13 11:33 AM      Result Value Range   Glucose-Capillary 121 (*) 70 - 99 mg/dL   Comment 1 Notify RN    GLUCOSE, CAPILLARY     Status: Abnormal   Collection Time    04/27/13  4:49 PM      Result Value Range   Glucose-Capillary 158 (*) 70 - 99 mg/dL  GLUCOSE, CAPILLARY     Status: Abnormal   Collection Time    04/27/13  9:33 PM      Result Value Range   Glucose-Capillary 180 (*) 70 - 99 mg/dL  RENAL FUNCTION PANEL     Status: Abnormal   Collection Time    04/28/13  5:50 AM      Result Value Range   Sodium 136  135 - 145 mEq/L   Potassium 4.4  3.5 - 5.1 mEq/L   Chloride 100  96 - 112 mEq/L   CO2 22  19 - 32 mEq/L   Glucose, Bld 150 (*) 70 - 99 mg/dL   BUN 49 (*) 6 - 23 mg/dL   Creatinine, Ser 5.28 (*) 0.50 - 1.35 mg/dL   Calcium 9.0  8.4 - 41.3 mg/dL   Phosphorus 5.4 (*) 2.3 - 4.6 mg/dL   Albumin 2.6 (*) 3.5 - 5.2 g/dL   GFR calc non Af Amer 31 (*) >90 mL/min   GFR calc Af Amer 35 (*) >90 mL/min   Comment:            The eGFR has been calculated     using the CKD EPI equation.     This calculation has not been     validated in all clinical     situations.     eGFR's persistently     <90 mL/min signify     possible Chronic Kidney Disease.  GLUCOSE, CAPILLARY     Status: Abnormal   Collection Time    04/28/13  7:06 AM      Result  Value Range   Glucose-Capillary 139 (*) 70 - 99 mg/dL   Comment 1 Notify RN    GLUCOSE, CAPILLARY     Status: Abnormal   Collection Time    04/28/13 11:37 AM      Result Value Range   Glucose-Capillary 139 (*) 70 - 99 mg/dL   Comment 1 Notify RN    GLUCOSE, CAPILLARY     Status: Abnormal   Collection Time    04/28/13  4:36 PM      Result Value Range   Glucose-Capillary 179 (*) 70 - 99 mg/dL   Comment 1 Notify RN    GLUCOSE, CAPILLARY     Status: Abnormal   Collection Time    04/28/13  9:17 PM      Result Value Range   Glucose-Capillary 159 (*)  70 - 99 mg/dL  RENAL FUNCTION PANEL     Status: Abnormal   Collection Time    04/29/13  4:25 AM      Result Value Range   Sodium 134 (*) 135 - 145 mEq/L   Potassium 3.8  3.5 - 5.1 mEq/L   Chloride 99  96 - 112 mEq/L   CO2 24  19 - 32 mEq/L   Glucose, Bld 180 (*) 70 - 99 mg/dL   BUN 49 (*) 6 - 23 mg/dL   Creatinine, Ser 8.29 (*) 0.50 - 1.35 mg/dL   Calcium 8.7  8.4 - 56.2 mg/dL   Phosphorus 4.6  2.3 - 4.6 mg/dL   Albumin 2.7 (*) 3.5 - 5.2 g/dL   GFR calc non Af Amer 30 (*) >90 mL/min   GFR calc Af Amer 34 (*) >90 mL/min   Comment:            The eGFR has been calculated     using the CKD EPI equation.     This calculation has not been     validated in all clinical     situations.     eGFR's persistently     <90 mL/min signify     possible Chronic Kidney Disease.  GLUCOSE, CAPILLARY     Status: Abnormal   Collection Time    04/29/13  7:14 AM      Result Value Range   Glucose-Capillary 171 (*) 70 - 99 mg/dL   Comment 1 Notify RN    GLUCOSE, CAPILLARY     Status: Abnormal   Collection Time    04/29/13 11:24 AM      Result Value Range   Glucose-Capillary 181 (*) 70 - 99 mg/dL   Comment 1 Notify RN    GLUCOSE, CAPILLARY     Status: Abnormal   Collection Time    04/29/13  4:27 PM      Result Value Range   Glucose-Capillary 250 (*) 70 - 99 mg/dL  GLUCOSE, CAPILLARY     Status: Abnormal   Collection Time    04/29/13  8:39  PM      Result Value Range   Glucose-Capillary 187 (*) 70 - 99 mg/dL  CREATININE, SERUM     Status: Abnormal   Collection Time    04/30/13  5:21 AM      Result Value Range   Creatinine, Ser 2.07 (*) 0.50 - 1.35 mg/dL   GFR calc non Af Amer 32 (*) >90 mL/min   GFR calc Af Amer 37 (*) >90 mL/min   Comment:            The eGFR has been calculated     using the CKD EPI equation.     This calculation has not been     validated in all clinical     situations.     eGFR's persistently     <90 mL/min signify     possible Chronic Kidney Disease.  GLUCOSE, CAPILLARY     Status: Abnormal   Collection Time    04/30/13  7:33 AM      Result Value Range   Glucose-Capillary 198 (*) 70 - 99 mg/dL   Comment 1 Documented in Chart     Comment 2 Notify RN      Nursing note and vitals reviewed.  Constitutional: He is oriented to person, place, and time. He appears well-developed and well-nourished.  HENT:  Head: Normocephalic and atraumatic.  Eyes: Pupils are equal,  round, and reactive to light.  Cardiovascular: Normal rate and regular rhythm.  Pulmonary/Chest: Effort normal.  Abdominal: Soft. Bowel sounds are normal.  Musculoskeletal: He exhibits edema (1+ edema right foot).  Left knee with some tenderness along quad tendon Neurological: He is alert and oriented to person, place, and time.  Skin: Skin is warm. Rash (Multiple skin lesions with eccymotic areas exposed areas as well as scars from healed abrasions. ) noted. Multiple  Seborrheic  keratoses Back incision clean.  Upper body strength 4/5 in bilateral deltoid, biceps, triceps, grip  Right lower extremity 4 minus hip flexor knee extensor tr to 1/5 in right ankle dorsiflexor , 2-3/5 APF Left lower extremity 3 minus hip flexor knee extensor and ankle dorsiflexor plantar flexor  Sensory mildly reduced in both feet     Assessment/Plan: 1. Functional deficits secondary to Recurrent lumbar HNP and multi medical deconditioning which require  3+ hours per day of interdisciplinary therapy in a comprehensive inpatient rehab setting. Physiatrist is providing close team supervision and 24 hour management of active medical problems listed below. Physiatrist and rehab team continue to assess barriers to discharge/monitor patient progress toward functional and medical goals.   Spoke at length regarding his AFO. It seems to be functional for him. He wants something which you can't see as much or a brace which won't go up the leg. Will speak with PT, but I believe he should stay with what he's got---it is a relatively low profile brace also  FIM: FIM - Bathing Bathing Steps Patient Completed: Chest;Right Arm;Left Arm;Abdomen Bathing: 4: Steadying assist  FIM - Upper Body Dressing/Undressing Upper body dressing/undressing steps patient completed: Thread/unthread right sleeve of pullover shirt/dresss;Thread/unthread left sleeve of pullover shirt/dress;Put head through opening of pull over shirt/dress;Pull shirt over trunk Upper body dressing/undressing: 5: Set-up assist to: Obtain clothing/put away FIM - Lower Body Dressing/Undressing Lower body dressing/undressing steps patient completed: Don/Doff right sock;Don/Doff left sock Lower body dressing/undressing: 4: Min-Patient completed 75 plus % of tasks  FIM - Toileting Toileting steps completed by patient: Adjust clothing after toileting;Performs perineal hygiene;Adjust clothing prior to toileting Toileting Assistive Devices: Grab bar or rail for support Toileting: 5: Supervision: Safety issues/verbal cues  FIM - Diplomatic Services operational officer Devices: Best boy Transfers: 5-To toilet/BSC: Supervision (verbal cues/safety issues);5-From toilet/BSC: Supervision (verbal cues/safety issues)  FIM - Banker Devices: Walker;Arm rests Bed/Chair Transfer: 5: Bed > Chair or W/C: Supervision (verbal cues/safety issues);5: Chair or  W/C > Bed: Supervision (verbal cues/safety issues)  FIM - Locomotion: Wheelchair Distance: 150 Locomotion: Wheelchair: 5: Travels 150 ft or more: maneuvers on rugs and over door sills with supervision, cueing or coaxing FIM - Locomotion: Ambulation Locomotion: Ambulation Assistive Devices: Designer, industrial/product Ambulation/Gait Assistance: 4: Min guard Locomotion: Ambulation: 2: Travels 50 - 149 ft with minimal assistance (Pt.>75%)  Comprehension Comprehension Mode: Auditory Comprehension: 6-Follows complex conversation/direction: With extra time/assistive device  Expression Expression Mode: Verbal Expression: 6-Expresses complex ideas: With extra time/assistive device  Social Interaction Social Interaction: 6-Interacts appropriately with others with medication or extra time (anti-anxiety, antidepressant).  Problem Solving Problem Solving: 6-Solves complex problems: With extra time  Memory Memory: 6-More than reasonable amt of time  Medical Problem List and Plan:  1. DVT Prophylaxis/Anticoagulation: Pharmaceutical: Lovenox  2. Pain Management: prn oxycodone effective.   -ice for mild quad tendon strain---improved 3. Mood: Motivated. Is feeling better past second surgery. Will have LCSW follow for evaluation.  4. Neuropsych: This patient is capable of making  decisions on his/her own behalf.  5. 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 Renal status improving with decrease in lasix and resolution of diarrhea.   -labs near baseline.  6. Dm type 2: Continue levemir --reasonable control at present 7. Large pericardial effusion without tamponade: For 2D echo in several weeks past discharge.  8. LE cellulitis: resolved. Diarrhea improving off antibiotics. 9. 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 Fluid overload improved. Continue coreg, lasix 80 mg/day  -weight steady at 71 to 72 kg 10. HTN: will monitor with bid checks. Continue lasix and coreg.  11. Anemia of  chronic disease: treated with IV iron and on aranesp  12. Constipation with nausea:   -sorbitol with good results---try to stay ahead with maintenance program    LOS (Days) 7 A FACE TO FACE EVALUATION WAS PERFORMED  Paulyne Mooty T 04/30/2013, 8:18 AM

## 2013-04-30 NOTE — Plan of Care (Signed)
Problem: RH SKIN INTEGRITY Goal: RH STG ABLE TO PERFORM INCISION/WOUND CARE W/ASSISTANCE STG Able To Perform Incision/Wound Care With min Assistance.  Outcome: Progressing Incision healing. Should require minium to no assist after discharge.

## 2013-04-30 NOTE — Progress Notes (Signed)
Physical Therapy Session Note  Patient Details  Name: [name] MRN: [id number] Date of Birth: 1946/10/28  Today's Date: 04/30/2013 Time: 1035-1100 Time Calculation (min): 25 min  Short Term Goals: Week 1:  PT Short Term Goal 1 (Week 1): =long term goals  Skilled Therapeutic Interventions/Progress Updates:    Donned pt's shoes and brace. Ambulation x 100' with RW and supervision cues for Lt. Knee control, pt prefers min to no cues as he feels he is "over thinking it." Some Lt. Knee hyperextension still noted, pain most evident in terminal stance when most hyperextension is observed. Feel with practice and strengthening pt will be able to prevent hyperextension and no bracing needed yet. Car transfer practiced with supervision, cues for back precautions pt able to maintain functionally.   Therapy Documentation Precautions:  Precautions Precautions: Back;Fall Precaution Comments: left leg weaker than right leg.   Required Braces or Orthoses: Other Brace/Splint Other Brace/Splint: no back brace; Rt AFO Restrictions Weight Bearing Restrictions: No Pain: Pain Assessment Pain Assessment: 0-10 Pain Score:   3 Pain Type: Acute pain Pain Location: Knee Pain Orientation: Left Pain Descriptors / Indicators: Sore Pain Onset: On-going RN made aware, called for medication  See FIM for current functional status  Therapy/Group: Individual Therapy  Wilhemina Bonito 04/30/2013, 11:51 AM

## 2013-04-30 NOTE — Plan of Care (Signed)
Problem: RH SKIN INTEGRITY Goal: RH STG MAINTAIN SKIN INTEGRITY WITH ASSISTANCE STG Maintain Skin Integrity With min Assistance.  Outcome: Progressing Lumbar incision healing appropriately. Skin abrasions with tegaderm to promote healing

## 2013-04-30 NOTE — Progress Notes (Addendum)
Occupational Therapy Note  Patient Details  Name: [name] MRN: [id number] Date of Birth: 1946/07/09 Today's Date: 04/30/2013  Time: 1100-1200 Pt denies pain although c/o discomfort in left knee with ambulation Individual Therapy  Cotx with Recreational Therapist.  Initial focus on kitchen management tasks; unwrapping new kitchen items (pans, glasses, etc.) and placing in dishwasher while adhering to back precautions.  Pt utilized tall bar stool to sit on when fatigued and when placing items in dishwasher.  Pt stated he has stools at home he can use when helping in kitchen.  Pt transitioned to functional ambulation with RW outside on variety of surfaces.  Pt commented that he could tell when the surface changed or with inclines or height variations.  Pt required verbal cues for RW safety when sitting down.  Discussed home safety regarding area rugs, etc..    Rich Brave 04/30/2013, 12:11 PM

## 2013-04-30 NOTE — Progress Notes (Signed)
Physical Therapy Session Note  Patient Details  Name: WILBON OBENCHAIN MRN: [id number] Date of Birth: 1946-05-21  Today's Date: 04/30/2013 Time: 1500-1530 Time Calculation (min): 30 min  Short Term Goals: Week 1:  PT Short Term Goal 1 (Week 1): =long term goals  Skilled Therapeutic Interventions/Progress Updates:    Patient received sitting in recliner. Session focused on gait training, balance, and LE strengthening. See details below for gait training. Patient performed 2 sets x10 reps of heel raises and mini squats with use of RW for B UE support and requires supervision. Patient performed alternating LE toe taps x20 on 4 inch step with B UE support on RW and requires min guard. Emphasis on fingertips on RW to decrease reliance through UEs and increase weight bearing through B LEs. Patient returned to room and left seated in recliner with all needs within reach and wife present.  Therapy Documentation Precautions:  Precautions Precautions: Back;Fall Precaution Comments: left leg weaker than right leg.   Required Braces or Orthoses: Other Brace/Splint Other Brace/Splint: no back brace; Rt AFO Restrictions Weight Bearing Restrictions: No Pain: Pain Assessment Pain Assessment: No/denies pain Pain Score: 0-No pain Locomotion : Ambulation Ambulation: Yes Ambulation/Gait Assistance: 5: Supervision Ambulation Distance (Feet): 120 Feet Assistive device: Rolling walker Ambulation/Gait Assistance Details: Verbal cues for gait pattern;Verbal cues for precautions/safety Ambulation/Gait Assistance Details: Patient with R AFO donned for whole session. Instructed in gait training 120' 1 with RW and supervision. Gait Gait: Yes Gait Pattern: Step-through pattern;Decreased hip/knee flexion - left;Decreased hip/knee flexion - right;Decreased stride length;Trunk flexed Wheelchair Mobility Wheelchair Mobility: Yes Wheelchair Assistance: 5: Supervision Wheelchair Assistance Details: Verbal cues for  safe use of DME/AE;Verbal cues for precautions/safety Wheelchair Propulsion: Both lower extermities;Both upper extremities (x50' with B LE for LE strengthening) Distance: 150  Balance: Dynamic Standing Balance Dynamic Standing - Balance Support: Left upper extremity supported Dynamic Standing - Level of Assistance: 4: Min assist Dynamic Standing - Balance Activities: Forward lean/weight shifting;Reaching for objects Dynamic Standing - Comments: Patient able to reach down to floor and pick up shoe while maintaining L UE on RW. Patient maintains back precautions during activity.  See FIM for current functional status  Therapy/Group: Individual Therapy  Chipper Herb. Johne Buckle, PT, DPT  04/30/2013, 4:32 PM

## 2013-04-30 NOTE — Progress Notes (Signed)
Recreational Therapy Session Note  Patient Details  Name: [name] MRN: [id number] Date of Birth: September 23, 1946 Today's Date: 04/30/2013 Time:  11-12 Pain: no c/o Skilled Therapeutic Interventions/Progress Updates: Session focused on activity tolerance, safe functional mobility during kitchen activity & community pursuits, problem solving compensatory strategies to complete tasks while adhering to back precautions.  Pt also ambulated on slightly uneven community outdoor surfaces using RW with close supervision and verbal cues for safety.  Discussion with pt about home safety including removing slip/throw rugs, pet safety, & use of leisure time. Therapy/Group: Co-Treatment  Kewanda Poland 04/30/2013, 12:15 PM

## 2013-04-30 NOTE — Progress Notes (Signed)
Occupational Therapy Session Note  Patient Details  Name: [name] MRN: [id number] Date of Birth: May 13, 1946  Today's Date: 04/30/2013 Time: 0800-0900 Time Calculation (min): 60 min  Short Term Goals: Week 1:  OT Short Term Goal 1 (Week 1): STG=LTG  Skilled Therapeutic Interventions/Progress Updates:    Pt worked on bathing and dressing shower level this session.  Pt used the RW to gather all clothes.  Pt performed grooming in sitting at the sink with setup as well.  Pt needs min assist currently to use the sockaide to donn sock over the left foot.  Utilized reacher for removing socks as well as drying off his LEs.  Recommend continued practice with AE.  Therapy Documentation Precautions:  Precautions Precautions: Back;Fall Precaution Comments: left leg weaker than right leg.   Required Braces or Orthoses: Other Brace/Splint Other Brace/Splint: no back brace; Rt AFO Restrictions Weight Bearing Restrictions: No  Pain: Pain Assessment Pain Assessment: No/denies pain Pain Score:   5 Pain Type: Acute pain Pain Location: Leg Pain Orientation: Left Pain Descriptors / Indicators: Aching Pain Onset: Gradual Pain Intervention(s): Medication (See eMAR) ADL: See FIM for current functional status  Therapy/Group: Individual Therapy  Aundraya Dripps OTR/L 04/30/2013, 9:35 AM

## 2013-04-30 NOTE — Plan of Care (Signed)
Problem: RH PAIN MANAGEMENT Goal: RH STG PAIN MANAGED AT OR BELOW PT'S PAIN GOAL Equal to or less than 3  Outcome: Progressing Decrease request for prn pain medication q 4hr

## 2013-05-01 ENCOUNTER — Inpatient Hospital Stay (HOSPITAL_COMMUNITY): Payer: PRIVATE HEALTH INSURANCE

## 2013-05-01 ENCOUNTER — Inpatient Hospital Stay (HOSPITAL_COMMUNITY): Payer: PRIVATE HEALTH INSURANCE | Admitting: Physical Therapy

## 2013-05-01 ENCOUNTER — Inpatient Hospital Stay (HOSPITAL_COMMUNITY): Payer: PRIVATE HEALTH INSURANCE | Admitting: *Deleted

## 2013-05-01 ENCOUNTER — Inpatient Hospital Stay (HOSPITAL_COMMUNITY): Payer: PRIVATE HEALTH INSURANCE | Admitting: Occupational Therapy

## 2013-05-01 DIAGNOSIS — R5381 Other malaise: Secondary | ICD-10-CM

## 2013-05-01 LAB — GLUCOSE, CAPILLARY
Glucose-Capillary: 133 mg/dL — ABNORMAL HIGH (ref 70–99)
Glucose-Capillary: 139 mg/dL — ABNORMAL HIGH (ref 70–99)

## 2013-05-01 NOTE — Progress Notes (Addendum)
Physical Therapy Session Note  Patient Details  Name: [name] MRN: [id number] Date of Birth: [birth year]  Today's Date: 05/01/2013 Time: 1100-1135 Time Calculation (min): 35 min  Short Term Goals: Week 1:  PT Short Term Goal 1 (Week 1): =long term goals  Skilled Therapeutic Interventions/Progress Updates:    Pt reporting Lt anterior thigh pain different from Lt knee pain which disappeared last night per pt. Pt feels pain may be muscular in nature but also reports there may be a possibility that it is nerve related.   Session focused on static standing balance and stabilization without UE support with normal and narrow bases of support on hard and compliant surfaces. Progressed by adding head turns to challenge vestibular system.   Discussed home D/C and need for continued use of RW as pt reports some pressure from wife to walk without walker. Discussed risk for falls and consequences, pt very aware of deficits and risks and states that he will not be ambulating without walker any time soon.   *Goals updated due to observed progress, modified independent ambulation goals downgraded to supervision.   Therapy Documentation Precautions:  Precautions Precautions: Back;Fall Precaution Comments: left leg weaker than right leg.   Required Braces or Orthoses: Other Brace/Splint Other Brace/Splint: no back brace; Rt AFO Restrictions Weight Bearing Restrictions: No Pain: Pain Assessment Pain Assessment: No/denies pain Pain Score:   4 Pain Type: Acute pain Pain Location: Leg Pain Orientation: Left Pain Descriptors / Indicators: Sore  See FIM for current functional status  Therapy/Group: Individual Therapy  Wilhemina Bonito 05/01/2013, 11:55 AM

## 2013-05-01 NOTE — Progress Notes (Signed)
Occupational Therapy Weekly Progress Note  Patient Details  Name: [name] MRN: [id number] Date of Birth: 1946-10-09  Today's Date: 05/01/2013 Time: 1000-1033 Time Calculation (min): 33 min  Weekly Progress:  Pt has been making steady progress with OT to this point.  Overall he is at a supervision level for selfcare tasks with the exception of LB dressing which he continues to need min assist level in order to donn his right shoe and AFO.  He is overall supervision level for all transfers and continues to demonstrate increased endurance for mobility and selfcare activities.  Plan for discharge on 6/20.  Feel he is on target to meet all of his goals at supervision to modified independent level.    Skilled Therapeutic Interventions/Progress Updates:    During session pt worked on functional mobility using the RW to ambulate to the gym.  Pt able to perform with close supervision, needing one rest break 1/2 way down to the gym.  Needs min instructional cueing to stand up tall and not push the walker too far in front of him.  In the gym worked on using the Development worker, community for UE strengthening and endurance.  Pt able to perform 5 mins forward on level 10 resistance, with RPMs at 17-20.  After 2 minute rest break pt peddled in posterior with the same level of resistance.  Ambulated back to the room with the walker as well.    Therapy Documentation Precautions:  Precautions Precautions: Back;Fall Precaution Comments: left leg weaker than right leg.   Required Braces or Orthoses: Other Brace/Splint Other Brace/Splint: no back brace; Rt AFO Restrictions Weight Bearing Restrictions: No  Pain: Pain Assessment Pain Assessment: No/denies pain Pain Score:   4 Pain Type: Acute pain Pain Location: Leg Pain Orientation: Left Pain Descriptors / Indicators: Sore ADL: See FIM for current functional status  Therapy/Group: Individual Therapy  Destiny Trickey OTR/L 05/01/2013, 12:27 PM

## 2013-05-01 NOTE — Progress Notes (Signed)
Occupational Therapy Session Note  Patient Details  Name: [name] MRN: [id number] Date of Birth: [birth year]  Today's Date: 05/01/2013  Session 1 Time: 0900-0957 Time Calculation (min): 57 min  Short Term Goals: Week 1:  OT Short Term Goal 1 (Week 1): STG=LTG  Skilled Therapeutic Interventions/Progress Updates:    Pt resting in recliner upon arrival.  Pt engaged in bathing at shower level and dressing with sit<>stand from recliner.  Pt amb with RW to chest of drawers to select clothing prior to walking to bathroom for shower.  Pt used long handle sponge appropriately to bathe feet.  Pt used reacher appropriately to doff socks and thread pants.  Although patient was able to use sock aid appropriately more practice will be needed to gain proficiency in donning socks.  Pt required assistance donning right shoe.  Focus on activity tolerance, RW safety, AE use, energy conservation, and dynamic standing balance.  Therapy Documentation Precautions:  Precautions Precautions: Back;Fall Precaution Comments: left leg weaker than right leg.   Required Braces or Orthoses: Other Brace/Splint Other Brace/Splint: no back brace; Rt AFO Restrictions Weight Bearing Restrictions: No   Pain: Pain Assessment Pain Assessment: No/denies pain  See FIM for current functional status  Therapy/Group: Individual Therapy  Session 2 Time: 1300-1330 Pt c/o 3/10 pain in left upper leg which increased with activity; RN aware and repositioned Individual Therapy  Pt engaged in functional amb with RW for simple home mgmt tasks in ADL apartment.  Pt practiced retrieving items from floor using reacher and RW safety with transporting items.  Walker and Corporate investment banker were issued.  Pt stated that the pain in his left upper leg increased with activity.  Pt required steady A and min verbal cues for RW safety when sitting down.  Lavone Neri [facility] 05/01/2013, 10:04 AM

## 2013-05-01 NOTE — Progress Notes (Signed)
Physical Therapy Session Note  Patient Details  Name: [name] MRN: [id number] Date of Birth: 1946/06/23  Today's Date: 05/01/2013 Time: 0800-0845 Time Calculation (min): 45 min  Short Term Goals: Week 2:     Skilled Therapeutic Interventions/Progress Updates:    Pt reports overall progress and states he had a very active day yesterday with therapy and stated he was pleased with progress. Pt performed bed mobs with supervision assist brief vc to improve sequencing and body mechanics to protect LB, sit to stand supervision assist pt tends to use back of legs to stabilize to get to standing. Seen for gait training with RW with w/c behind with SBA close enough to reach pt if LOB occurs for 145 x 2 feet, 75 feet.  Wheelchair mobility with supervision assist with use of B LEs to help increase strength B LEs. Standing and  B LE strengthening/balance including mini squats and heel raises. Dynamic balance with use of RW to improve reaching and balance recovery with reaching out of BOS and crossing midline with maintaining back precautions.    Therapy Documentation Precautions:  Precautions Precautions: Back;Fall Precaution Comments: left leg weaker than right leg.   Required Braces or Orthoses: Other Brace/Splint Other Brace/Splint: no back brace; Rt AFO Restrictions Weight Bearing Restrictions: No       Pain: Pain Assessment Pain Assessment: No/denies pain at present but states he had increased pain last night to 5/10 after twisting to reach for something from the bed.     Locomotion : Ambulation Ambulation/Gait Assistance: 5: Supervision                See FIM for current functional status  Therapy/Group: Individual Therapy  Jackelyn Knife 05/01/2013, 1:10 PM

## 2013-05-01 NOTE — Progress Notes (Signed)
Patient ID: [name], male   DOB: May 27, 1946, 67 y.o.   MRN: [id number] Subjective/Complaints: Had some anterior leg/hip pain yesterday after twisting in bed to reach button. Did a lot more physically with therapy yesterday.   Review of Systems   All other systems reviewed and are negative.    Objective: Vital Signs: Blood pressure 151/86, pulse 68, temperature 98.2 F (36.8 C), temperature source Oral, resp. rate 17, height 6\' 1"  (1.854 m), weight 72.2 kg (159 lb 2.8 oz), SpO2 98.00%. No results found. Results for orders placed during the hospital encounter of 04/23/13 (from the past 72 hour(s))  GLUCOSE, CAPILLARY     Status: Abnormal   Collection Time    04/28/13 11:37 AM      Result Value Range   Glucose-Capillary 139 (*) 70 - 99 mg/dL   Comment 1 Notify RN    GLUCOSE, CAPILLARY     Status: Abnormal   Collection Time    04/28/13  4:36 PM      Result Value Range   Glucose-Capillary 179 (*) 70 - 99 mg/dL   Comment 1 Notify RN    GLUCOSE, CAPILLARY     Status: Abnormal   Collection Time    04/28/13  9:17 PM      Result Value Range   Glucose-Capillary 159 (*) 70 - 99 mg/dL  RENAL FUNCTION PANEL     Status: Abnormal   Collection Time    04/29/13  4:25 AM      Result Value Range   Sodium 134 (*) 135 - 145 mEq/L   Potassium 3.8  3.5 - 5.1 mEq/L   Chloride 99  96 - 112 mEq/L   CO2 24  19 - 32 mEq/L   Glucose, Bld 180 (*) 70 - 99 mg/dL   BUN 49 (*) 6 - 23 mg/dL   Creatinine, Ser 7.82 (*) 0.50 - 1.35 mg/dL   Calcium 8.7  8.4 - 95.6 mg/dL   Phosphorus 4.6  2.3 - 4.6 mg/dL   Albumin 2.7 (*) 3.5 - 5.2 g/dL   GFR calc non Af Amer 30 (*) >90 mL/min   GFR calc Af Amer 34 (*) >90 mL/min   Comment:            The eGFR has been calculated     using the CKD EPI equation.     This calculation has not been     validated in all clinical     situations.     eGFR's persistently     <90 mL/min signify     possible Chronic Kidney Disease.  GLUCOSE, CAPILLARY     Status: Abnormal    Collection Time    04/29/13  7:14 AM      Result Value Range   Glucose-Capillary 171 (*) 70 - 99 mg/dL   Comment 1 Notify RN    GLUCOSE, CAPILLARY     Status: Abnormal   Collection Time    04/29/13 11:24 AM      Result Value Range   Glucose-Capillary 181 (*) 70 - 99 mg/dL   Comment 1 Notify RN    GLUCOSE, CAPILLARY     Status: Abnormal   Collection Time    04/29/13  4:27 PM      Result Value Range   Glucose-Capillary 250 (*) 70 - 99 mg/dL  GLUCOSE, CAPILLARY     Status: Abnormal   Collection Time    04/29/13  8:39 PM      Result Value Range  Glucose-Capillary 187 (*) 70 - 99 mg/dL  CREATININE, SERUM     Status: Abnormal   Collection Time    04/30/13  5:21 AM      Result Value Range   Creatinine, Ser 2.07 (*) 0.50 - 1.35 mg/dL   GFR calc non Af Amer 32 (*) >90 mL/min   GFR calc Af Amer 37 (*) >90 mL/min   Comment:            The eGFR has been calculated     using the CKD EPI equation.     This calculation has not been     validated in all clinical     situations.     eGFR's persistently     <90 mL/min signify     possible Chronic Kidney Disease.  GLUCOSE, CAPILLARY     Status: Abnormal   Collection Time    04/30/13  7:33 AM      Result Value Range   Glucose-Capillary 198 (*) 70 - 99 mg/dL   Comment 1 Documented in Chart     Comment 2 Notify RN    GLUCOSE, CAPILLARY     Status: Abnormal   Collection Time    04/30/13 12:12 PM      Result Value Range   Glucose-Capillary 130 (*) 70 - 99 mg/dL  GLUCOSE, CAPILLARY     Status: Abnormal   Collection Time    04/30/13  4:16 PM      Result Value Range   Glucose-Capillary 153 (*) 70 - 99 mg/dL   Comment 1 Notify RN    GLUCOSE, CAPILLARY     Status: Abnormal   Collection Time    04/30/13  8:30 PM      Result Value Range   Glucose-Capillary 157 (*) 70 - 99 mg/dL   Comment 1 Notify RN    GLUCOSE, CAPILLARY     Status: Abnormal   Collection Time    05/01/13  7:27 AM      Result Value Range   Glucose-Capillary 133  (*) 70 - 99 mg/dL   Comment 1 Documented in Chart      Nursing note and vitals reviewed.  Constitutional: He is oriented to person, place, and time. He appears well-developed and well-nourished.  HENT:  Head: Normocephalic and atraumatic.  Eyes: Pupils are equal, round, and reactive to light.  Cardiovascular: Normal rate and regular rhythm.  Pulmonary/Chest: Effort normal.  Abdominal: Soft. Bowel sounds are normal.  Musculoskeletal: He exhibits edema (tr to 1+ edema right foot).  Left hip,quads non-tender Neurological: He is alert and oriented to person, place, and time.  Skin: Skin is warm. Rash (Multiple skin lesions with eccymotic areas exposed areas as well as scars from healed abrasions. ) noted. Multiple  Seborrheic  keratoses Back incision clean.  Upper body strength 4/5 in bilateral deltoid, biceps, triceps, grip  Right lower extremity 4 minus hip flexor knee extensor tr to 1/5 in right ankle dorsiflexor , 2-3/5 APF Left lower extremity 3 minus hip flexor knee extensor and ankle dorsiflexor plantar flexor  Sensory mildly reduced in both feet--no change.      Assessment/Plan: 1. 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 Physiatrist is providing close team supervision and 24 hour management of active medical problems listed below. Physiatrist and rehab team continue to assess barriers to discharge/monitor patient progress toward functional and medical goals.  FIM: FIM - Bathing Bathing Steps Patient Completed: Chest;Right Arm;Left Arm;Abdomen;Front perineal area Bathing: 5: Supervision: Safety issues/verbal cues  FIM - Upper Body Dressing/Undressing Upper body dressing/undressing steps patient completed: Thread/unthread right sleeve of pullover shirt/dresss;Thread/unthread left sleeve of pullover shirt/dress;Put head through  opening of pull over shirt/dress;Pull shirt over trunk Upper body dressing/undressing: 5: Set-up assist to: Obtain clothing/put away FIM - Lower Body Dressing/Undressing Lower body dressing/undressing steps patient completed: Thread/unthread right underwear leg;Thread/unthread left underwear leg;Pull underwear up/down;Thread/unthread right pants leg;Thread/unthread left pants leg;Pull pants up/down;Don/Doff right sock Lower body dressing/undressing: 4: Min-Patient completed 75 plus % of tasks  FIM - Toileting Toileting steps completed by patient: Adjust clothing after toileting;Performs perineal hygiene;Adjust clothing prior to toileting Toileting Assistive Devices: Grab bar or rail for support Toileting: 5: Supervision: Safety issues/verbal cues  FIM - Diplomatic Services operational officer Devices: Best boy Transfers: 5-To toilet/BSC: Supervision (verbal cues/safety issues);5-From toilet/BSC: Supervision (verbal cues/safety issues)  FIM - Banker Devices: Therapist, occupational: 5: Bed > Chair or W/C: Supervision (verbal cues/safety issues);5: Chair or W/C > Bed: Supervision (verbal cues/safety issues)  FIM - Locomotion: Wheelchair Distance: 150 Locomotion: Wheelchair: 5: Travels 150 ft or more: maneuvers on rugs and over door sills with supervision, cueing or coaxing FIM - Locomotion: Ambulation Locomotion: Ambulation Assistive Devices: Designer, industrial/product Ambulation/Gait Assistance: 5: Supervision Locomotion: Ambulation: 2: Travels 50 - 149 ft with supervision/safety issues  Comprehension Comprehension Mode: Auditory Comprehension: 6-Follows complex conversation/direction: With extra time/assistive device  Expression Expression Mode: Verbal Expression: 6-Expresses complex ideas: With extra time/assistive device  Social Interaction Social Interaction: 6-Interacts appropriately with others with medication or extra time  (anti-anxiety, antidepressant).  Problem Solving Problem Solving: 6-Solves complex problems: With extra time  Memory Memory: 6-More than reasonable amt of time  Medical Problem List and Plan:  1. DVT Prophylaxis/Anticoagulation: Pharmaceutical: Lovenox  2. Pain Management: prn oxycodone effective.   -ice for mild quad tendon strain---improved  -probably strained quad/hip flexor while in bed--observe today. Neuro exam stable 3. Mood: Motivated. Is feeling better past second surgery. Will have LCSW follow for evaluation.  4. Neuropsych: This patient is capable of making decisions on his/her own behalf.  5. 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 Renal status improving with decrease in lasix and resolution of diarrhea.   -labs near baseline.  6. Dm type 2: Continue levemir --reasonable control at present 7. Large pericardial effusion without tamponade: For 2D echo in several weeks past discharge.  8. LE cellulitis: resolved. Diarrhea improving off antibiotics. 9. 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 Fluid overload improved. Continue coreg, lasix 80 mg/day  -weight steady at 71 to 72 kg 10. HTN: will monitor with bid checks. Continue lasix and coreg.  11. Anemia of chronic disease: treated with IV iron and on aranesp  12. Constipation with nausea:   -sorbitol with good results---try to stay ahead with maintenance program    LOS (Days) 8 A FACE TO FACE EVALUATION WAS PERFORMED  Bryler Dibble T 05/01/2013, 8:36 AM

## 2013-05-01 NOTE — Progress Notes (Signed)
Recreational Therapy Session Note  Patient Details  Name: [name] MRN: [id number] Date of Birth: [birth year] Today's Date: 05/01/2013 Time:  1330-1410 Pain: c/o L thigh pain, premedicated per pt report Skilled Therapeutic Interventions/Progress Updates: Pt c/o fatigue and left thigh pain.  Pt performed sit->stand with RW and ambulated to recliners with supervision.  Pt about to doff both shoes after sitting down in recliner while adhering to precautions.  Session focused on discussion about community pursuits post discharge and potential for pt to participate in community reintegration/outing prior to d/c.  Discussion included potential barriers & ways to negotiate, energy conservation techniques and potential therapy goals.  Pt interested in outing, but indecisive at this time.  Will follow up tomorrow about pt's preference.  Therapy/Group: Individual Therapy  Franco Duley 05/01/2013, 2:49 PM

## 2013-05-02 ENCOUNTER — Inpatient Hospital Stay (HOSPITAL_COMMUNITY): Payer: PRIVATE HEALTH INSURANCE

## 2013-05-02 ENCOUNTER — Inpatient Hospital Stay (HOSPITAL_COMMUNITY): Payer: PRIVATE HEALTH INSURANCE | Admitting: Physical Therapy

## 2013-05-02 DIAGNOSIS — R5381 Other malaise: Secondary | ICD-10-CM

## 2013-05-02 LAB — GLUCOSE, CAPILLARY
Glucose-Capillary: 130 mg/dL — ABNORMAL HIGH (ref 70–99)
Glucose-Capillary: 191 mg/dL — ABNORMAL HIGH (ref 70–99)
Glucose-Capillary: 223 mg/dL — ABNORMAL HIGH (ref 70–99)

## 2013-05-02 NOTE — Progress Notes (Signed)
Occupational Therapy Session Note  Patient Details  Name: [name] MRN: [id number] Date of Birth: [birth year]  Today's Date: 05/02/2013 Time: 0900-0957 Time Calculation (min): 57 min  Short Term Goals: Week 2:   STG=LTG  Skilled Therapeutic Interventions/Progress Updates:  Pt engaged in bathing at shower level and dressing with sit<>stand from recliner.  Pt used all AE approoriately during bathing and dressing tasks.  Pt requires extra time when using sock aid but his proficiency improves each day.  Pt maintains his back precautions and remember to use AE when necessary.  Pt continues to require rest breaks throughout session.  Focus on activity tolerance, safety awareness, AE use, dynamic standing balance, and functional ambulation with RW in room.    Therapy Documentation Precautions:  Precautions Precautions: Back;Fall Precaution Comments: left leg weaker than right leg.   Required Braces or Orthoses: Other Brace/Splint Other Brace/Splint: no back brace; Rt AFO Restrictions Weight Bearing Restrictions: No   Pain: Pain Assessment Pain Assessment: 0-10 Pain Score:   4 Pain Type: Acute pain Pain Location: Leg Pain Orientation: Left Pain Descriptors / Indicators: Sore Pain Onset: Gradual Patients Stated Pain Goal: 2 Pain Intervention(s): RN made aware;Repositioned  See FIM for current functional status  Therapy/Group: Individual Therapy  Session 2 Time: 1300-1345 Pt c/o 4/10 pain in left leg but stated the pain eased off with activity. Individual Therapy  Pt rolled to therapy gym for BUE therex on UE ergometer (5 min X level 5, rest 2 min, 5 min X level 5 in reverse). Pt transitioned to ADL apartment to use toilet.  Pt amb with RW to bathroom and back into room.  Pt required verbal cue X 1 for RW safety when sitting down.  Pt engaged in home mgmt tasks at ambulatory level in ADL apartment.  Pt maintains back precautions independently and uses AE  appropriately.   Lavone Neri [facility] 05/02/2013, 10:01 AM

## 2013-05-02 NOTE — Progress Notes (Signed)
Patient ID: [name], male   DOB: 10/08/1946, 67 y.o.   MRN: [id number] Subjective/Complaints: No new complaints. Still with some back and occasional leftleg pain.  Review of Systems   All other systems reviewed and are negative.    Objective: Vital Signs: Blood pressure 151/76, pulse 71, temperature 98 F (36.7 C), temperature source Oral, resp. rate 18, height 6\' 1"  (1.854 m), weight 69.7 kg (153 lb 10.6 oz), SpO2 97.00%. No results found. Results for orders placed during the hospital encounter of 04/23/13 (from the past 72 hour(s))  GLUCOSE, CAPILLARY     Status: Abnormal   Collection Time    04/29/13 11:24 AM      Result Value Range   Glucose-Capillary 181 (*) 70 - 99 mg/dL   Comment 1 Notify RN    GLUCOSE, CAPILLARY     Status: Abnormal   Collection Time    04/29/13  4:27 PM      Result Value Range   Glucose-Capillary 250 (*) 70 - 99 mg/dL  GLUCOSE, CAPILLARY     Status: Abnormal   Collection Time    04/29/13  8:39 PM      Result Value Range   Glucose-Capillary 187 (*) 70 - 99 mg/dL  CREATININE, SERUM     Status: Abnormal   Collection Time    04/30/13  5:21 AM      Result Value Range   Creatinine, Ser 2.07 (*) 0.50 - 1.35 mg/dL   GFR calc non Af Amer 32 (*) >90 mL/min   GFR calc Af Amer 37 (*) >90 mL/min   Comment:            The eGFR has been calculated     using the CKD EPI equation.     This calculation has not been     validated in all clinical     situations.     eGFR's persistently     <90 mL/min signify     possible Chronic Kidney Disease.  GLUCOSE, CAPILLARY     Status: Abnormal   Collection Time    04/30/13  7:33 AM      Result Value Range   Glucose-Capillary 198 (*) 70 - 99 mg/dL   Comment 1 Documented in Chart     Comment 2 Notify RN    GLUCOSE, CAPILLARY     Status: Abnormal   Collection Time    04/30/13 12:12 PM      Result Value Range   Glucose-Capillary 130 (*) 70 - 99 mg/dL  GLUCOSE, CAPILLARY     Status: Abnormal   Collection Time     04/30/13  4:16 PM      Result Value Range   Glucose-Capillary 153 (*) 70 - 99 mg/dL   Comment 1 Notify RN    GLUCOSE, CAPILLARY     Status: Abnormal   Collection Time    04/30/13  8:30 PM      Result Value Range   Glucose-Capillary 157 (*) 70 - 99 mg/dL   Comment 1 Notify RN    GLUCOSE, CAPILLARY     Status: Abnormal   Collection Time    05/01/13  7:27 AM      Result Value Range   Glucose-Capillary 133 (*) 70 - 99 mg/dL   Comment 1 Documented in Chart    GLUCOSE, CAPILLARY     Status: Abnormal   Collection Time    05/01/13 11:26 AM      Result Value Range   Glucose-Capillary  139 (*) 70 - 99 mg/dL   Comment 1 Documented in Chart    GLUCOSE, CAPILLARY     Status: Abnormal   Collection Time    05/01/13  4:45 PM      Result Value Range   Glucose-Capillary 169 (*) 70 - 99 mg/dL  GLUCOSE, CAPILLARY     Status: Abnormal   Collection Time    05/01/13  9:08 PM      Result Value Range   Glucose-Capillary 146 (*) 70 - 99 mg/dL  GLUCOSE, CAPILLARY     Status: None   Collection Time    05/02/13  7:32 AM      Result Value Range   Glucose-Capillary 93  70 - 99 mg/dL   Comment 1 Documented in Chart      Nursing note and vitals reviewed.  Constitutional: He is oriented to person, place, and time. He appears well-developed and well-nourished.  HENT:  Head: Normocephalic and atraumatic.  Eyes: Pupils are equal, round, and reactive to light.  Cardiovascular: Normal rate and regular rhythm.  Pulmonary/Chest: Effort normal.  Abdominal: Soft. Bowel sounds are normal.  Musculoskeletal: He exhibits edema (tr to 1+ edema right foot).  Left hip,quads non-tender Neurological: He is alert and oriented to person, place, and time.  Skin: Skin is warm. Rash (Multiple skin lesions with eccymotic areas exposed areas as well as scars from healed abrasions. ) noted. Multiple  Seborrheic  keratoses Back incision clean.  Upper body strength 4/5 in bilateral deltoid, biceps, triceps, grip  Right  lower extremity 4 minus hip flexor knee extensor tr to 1/5 in right ankle dorsiflexor , 2-3/5 APF Left lower extremity 3 minus hip flexor knee extensor and ankle dorsiflexor plantar flexor  Sensory mildly reduced in both feet--no change.      Assessment/Plan: 1. 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 Physiatrist is providing close team supervision and 24 hour management of active medical problems listed below. Physiatrist and rehab team continue to assess barriers to discharge/monitor patient progress toward functional and medical goals.    FIM: FIM - Bathing Bathing Steps Patient Completed: Chest;Right upper leg;Right Arm;Left upper leg;Right lower leg (including foot);Left Arm;Abdomen;Left lower leg (including foot);Front perineal area;Buttocks Bathing: 5: Supervision: Safety issues/verbal cues  FIM - Upper Body Dressing/Undressing Upper body dressing/undressing steps patient completed: Thread/unthread right sleeve of pullover shirt/dresss;Thread/unthread left sleeve of pullover shirt/dress;Put head through opening of pull over shirt/dress;Pull shirt over trunk Upper body dressing/undressing: 6: More than reasonable amount of time FIM - Lower Body Dressing/Undressing Lower body dressing/undressing steps patient completed: Thread/unthread right underwear leg;Thread/unthread left underwear leg;Pull underwear up/down;Thread/unthread right pants leg;Thread/unthread left pants leg;Pull pants up/down;Don/Doff right sock;Don/Doff left sock;Don/Doff left shoe Lower body dressing/undressing: 4: Min-Patient completed 75 plus % of tasks  FIM - Toileting Toileting steps completed by patient: Adjust clothing after toileting;Performs perineal hygiene;Adjust clothing prior to toileting Toileting Assistive Devices: Grab bar or rail for  support Toileting: 5: Supervision: Safety issues/verbal cues  FIM - Diplomatic Services operational officer Devices: Best boy Transfers: 5-To toilet/BSC: Supervision (verbal cues/safety issues);5-From toilet/BSC: Supervision (verbal cues/safety issues)  FIM - Bed/Chair Transfer Bed/Chair Transfer Assistive Devices: Therapist, occupational: 5: Bed > Chair or W/C: Supervision (verbal cues/safety issues);5: Chair or W/C > Bed: Supervision (verbal cues/safety issues)  FIM - Locomotion: Wheelchair Distance: 150 Locomotion: Wheelchair: 5: Travels 150 ft or more: maneuvers on rugs  and over door sills with supervision, cueing or coaxing FIM - Locomotion: Ambulation Locomotion: Ambulation Assistive Devices: Walker - Rolling Ambulation/Gait Assistance: 5: Supervision Locomotion: Ambulation: 2: Travels 50 - 149 ft with supervision/safety issues  Comprehension Comprehension Mode: Auditory Comprehension: 6-Follows complex conversation/direction: With extra time/assistive device  Expression Expression Mode: Verbal Expression: 6-Expresses complex ideas: With extra time/assistive device  Social Interaction Social Interaction: 6-Interacts appropriately with others with medication or extra time (anti-anxiety, antidepressant).  Problem Solving Problem Solving: 6-Solves complex problems: With extra time  Memory Memory: 6-More than reasonable amt of time  Medical Problem List and Plan:  1. DVT Prophylaxis/Anticoagulation: Pharmaceutical: Lovenox  2. Pain Management: prn oxycodone effective.   -ice for mild quad tendon strain---improved  -probably strained quad/hip flexor while in bed--observe today. Neuro exam stable 3. Mood: Motivated. Is feeling better past second surgery. Will have LCSW follow for evaluation.  4. Neuropsych: This patient is capable of making decisions on his/her own behalf.  5. 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 Renal  status improving with decrease in lasix and resolution of diarrhea.   -labs near baseline.  6. Dm type 2: Continue levemir --reasonable control at present 7. Large pericardial effusion without tamponade: For 2D echo in several weeks past discharge.  8. LE cellulitis: resolved. Diarrhea improving off antibiotics. 9. 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 Fluid overload improved. Continue coreg, lasix 80 mg/day  -weight steady around 70kg 10. HTN: will monitor with bid checks. Continue lasix and coreg.  11. Anemia of chronic disease: treated with IV iron and on aranesp  12. Constipation with nausea:   -maintenance program    LOS (Days) 9 A FACE TO FACE EVALUATION WAS PERFORMED  Celise Bazar T 05/02/2013, 8:51 AM

## 2013-05-02 NOTE — Progress Notes (Signed)
Recreational Therapy Session Note  Patient Details  Name: [name] MRN: [id number] Date of Birth: 05-Mar-1946 Today's Date: 05/02/2013  Pt & wife participated in animal assisted activity/therapy seated in recliner with supervision  Satori Krabill 05/02/2013, 5:48 PM

## 2013-05-02 NOTE — Progress Notes (Signed)
Physical Therapy Note  Patient Details  Name: [name] MRN: [id number] Date of Birth: 02/07/1946 Today's Date: 05/02/2013  4098-1191 (55 minutes) individual Pain: 4/10 left LE/premedicated Other : Back precautions; RT AFO secondary to foot drop Focus of treatment: Gait training/endurance; Bilateral LE strengthening to improve sit to stand Treatment: Pt in recliner upon arrival; gait 35 feet X 2 RW close SBA with seated rest break; Sit to stand for quad strengthening with minimal use of UEs (required 26 inch height mat) 2 X 5 before c/o fatigue; wc mobility - 120 feet SBA on level surfaces.   1500-1525 (25 minutes) individual Pain: 4/10 left LE; premedicated Focus of treatment: Therapeutic exercise focused on bilateral LE strengthening Treatment: Standing to sink bilateral LE strengthening X 15- hip flexion, hip abduction; up/down 6 inch step with RW support X 10 on RT LE, X 3 on LT LE (decreased eccentric control on left).    Jaimya Feliciano,JIM 05/02/2013, 11:35 AM

## 2013-05-02 NOTE — Patient Care Conference (Signed)
Inpatient RehabilitationTeam Conference and Plan of Care Update Date: 05/01/2013   Time: 2:15 PM    Patient Name: [name]      Medical Record Number: [id number]  Date of Birth: Jan 12, 1946 Sex: Male         Room/Bed: 4036/4036-01 Payor Info: Payor: MEDCOST / Plan: MEDCOST / Product Type: *No Product type* /    Admitting Diagnosis: redo lam/disectomy  Admit Date/Time:  04/23/2013  4:45 PM Admission Comments: No comment available   Primary Diagnosis:  HNP (herniated nucleus pulposus), lumbar Principal Problem: HNP (herniated nucleus pulposus), lumbar  Patient Active Problem List   Diagnosis Date Noted  . HNP (herniated nucleus pulposus), lumbar--redo microdisectomy 04/19/13 04/24/2013  . S/P laminectomy 04/22/2013  . Positive D dimer 04/10/2013  . Dehydration 04/10/2013  . Acute respiratory failure with hypoxia 04/10/2013  . Hx of decompressive lumbar laminectomy (L-3) [id number], 2014 04/10/2013  . Pericardial effusion 04/10/2013  . SIRS (systemic inflammatory response syndrome) 04/09/2013  . Demand ischemia 04/09/2013  . Diabetes mellitus type 2, controlled 04/09/2013  . Cellulitis of right lower extremity 04/09/2013  . CAD (coronary artery disease) 04/09/2013  . Renal failure (ARF), acute on chronic kidney disease stage 3 04/09/2013  . HTN (hypertension) 10/04/2012  . Septic arthritis of knee, left 08/23/2012  . History of renal transplant 08/23/2012    Expected Discharge Date: Expected Discharge Date: 05/04/13  Team Members Present: Physician leading conference: Dr. Faith Rogue Social Worker Present: Amada Jupiter, LCSW Nurse Present: Daryll Brod, RN PT Present: Other (comment) Sherrine Maples, PT) OT Present: Edwin Cap, Loistine Chance, OT Other (Discipline and Name): Tora Duck, PPS Coordinator     Current Status/Progress Goal Weekly Team Focus  Medical   see prior, discussed afo, moving bowels  prepare for de  afo training, bowel mgt, pain mgt    Bowel/Bladder   Continent of bowel and bladder. LBM 6/14 after sorbitol and suppository  Mod I  Monitor and given bowel aid on day 3,if no elimination   Swallow/Nutrition/ Hydration             ADL's   Pt is currently min guard assist for bathing, toileting, and shower transfers, needs min assist for LB dressing secondary to needing further practice with AE.    Overall modified independent  endurance, selfcare retraining, DME/AE education, balance   Mobility   supervision  mod I/supervision  Strengthening, balance, endurance, family education, transfers, back precautions during functional activity   Communication             Safety/Cognition/ Behavioral Observations            Pain   Oxy IR 10 mq q 4hrs prn. Decreased request q 4hrs  <4  Monitor for frequency of request   Skin   Lumbar incison healing, OTA,unremarkable. Skin dry, flaky, with multiple nodule. Skin abrasion x 3 with mepilex to area  No additional skin breakdown  Continue to assess skin for appropriate healing      *See Care Plan and progress notes for long and short-term goals.  Barriers to Discharge: safety    Possible Resolutions to Barriers:  supervision    Discharge Planning/Teaching Needs:  home with wife to provide any assistance needed      Team Discussion:  Making good progress, however, pain complaints continue and vary locations.  Plan family ed on Thursday  Revisions to Treatment Plan:  None   Continued Need for Acute Rehabilitation Level of Care: The patient requires daily medical management  by a physician with specialized training in physical medicine and rehabilitation for the following conditions: Daily direction of a multidisciplinary physical rehabilitation program to ensure safe treatment while eliciting the highest outcome that is of practical value to the patient.: Yes Daily medical management of patient stability for increased activity during participation in an intensive rehabilitation  regime.: Yes Daily analysis of laboratory values and/or radiology reports with any subsequent need for medication adjustment of medical intervention for : Post surgical problems;Neurological problems  Lanessa Shill 05/02/2013, 12:25 PM

## 2013-05-03 ENCOUNTER — Inpatient Hospital Stay (HOSPITAL_COMMUNITY): Payer: PRIVATE HEALTH INSURANCE | Admitting: *Deleted

## 2013-05-03 ENCOUNTER — Inpatient Hospital Stay (HOSPITAL_COMMUNITY): Payer: PRIVATE HEALTH INSURANCE

## 2013-05-03 ENCOUNTER — Inpatient Hospital Stay (HOSPITAL_COMMUNITY): Payer: PRIVATE HEALTH INSURANCE | Admitting: Physical Therapy

## 2013-05-03 LAB — BASIC METABOLIC PANEL
CO2: 22 mEq/L (ref 19–32)
Calcium: 8.8 mg/dL (ref 8.4–10.5)
Chloride: 98 mEq/L (ref 96–112)
Sodium: 132 mEq/L — ABNORMAL LOW (ref 135–145)

## 2013-05-03 LAB — CBC
MCH: 29.8 pg (ref 26.0–34.0)
Platelets: 151 10*3/uL (ref 150–400)
RBC: 3.36 MIL/uL — ABNORMAL LOW (ref 4.22–5.81)
WBC: 9.2 10*3/uL (ref 4.0–10.5)

## 2013-05-03 LAB — GLUCOSE, CAPILLARY: Glucose-Capillary: 148 mg/dL — ABNORMAL HIGH (ref 70–99)

## 2013-05-03 NOTE — Discharge Summary (Signed)
Physician Discharge Summary  Patient ID: [name] MRN: [id number] DOB/AGE: [birth year] 65 y.o.  Admit date: 04/23/2013 Discharge date: 05/04/2013  Discharge Diagnoses:  Principal Problem:   HNP (herniated nucleus pulposus), lumbar--redo microdisectomy 04/19/13 Active Problems:   History of renal transplant   HTN (hypertension)   Diabetes mellitus type 2, controlled   Renal failure (ARF), acute on chronic kidney disease stage 3   Discharged Condition: Good  Significant Diagnostic Studies: Dg Hip Complete Left  04/16/2013   *RADIOLOGY REPORT*  Clinical Data: Fall.  Left hip and knee pain.  LEFT HIP - COMPLETE 2+ VIEW  Comparison: None.  Findings: Pelvic rings appear intact.  Right hip hemiarthroplasty. Atherosclerosis.  No displaced pelvic fracture.  Left hip chondrocalcinosis.  No left hip fracture.  No significant joint space narrowing.  IMPRESSION: No acute osseous abnormality.  Chondrocalcinosis of the left hip, which may be associated with CPPD arthropathy or senile.   Original Report Authenticated By: Andreas Newport, M.D.    Labs:  Basic Metabolic Panel:  Recent Labs Lab 04/28/13 0550 04/29/13 0425 04/30/13 0521 05/03/13 0645  NA 136 134*  --  132*  K 4.4 3.8  --  3.5  CL 100 99  --  98  CO2 22 24  --  22  GLUCOSE 150* 180*  --  132*  BUN 49* 49*  --  54*  CREATININE 2.16* 2.22* 2.07* 2.24*  CALCIUM 9.0 8.7  --  8.8  PHOS 5.4* 4.6  --   --     CBC:  Recent Labs Lab 05/03/13 0645  WBC 9.2  HGB 10.0*  HCT 31.2*  MCV 92.9  PLT 151    CBG:  Recent Labs Lab 05/03/13 1145 05/03/13 1647 05/03/13 2050 05/04/13 0722 05/04/13 1130  GLUCAP 135* 148* 233* 141* 162*    Brief HPI:   [name] is a 67 y.o. right-handed male with history of renal transplant, ICD/pacemaker, systolic congestive heart failure, diabetes mellitus with peripheral neuropathy and chronic right foot drop. Patient with recent low back surgery 2 weeks ago for L3 decompression  discectomy brought to the emergency room 04/09/2013 with altered mental status, mild shortness of breath. Patient noted to have low-grade fever with evidence of RLE cellulitis. He was started on IV antibiotics for SIRS. 2D echo revealed large pleural effusion and nephrology recommended increasing diuretics to help with edema as well as effusion. Repeat echo 06/03 with slight decrease in size of effusion. He continued with back and hip with myelogram with recurrent HNP L3/4 and patient underwent redo microdiskectomy on 04/19/13 by Dr. Lovell Sheehan. Back and hip pain improved. Therapies initiated and team recommended CIR.    Hospital Course: [name] was admitted to rehab 04/23/2013 for inpatient therapies to consist of PT, ST and OT at least three hours five days a week. Past admission physiatrist, therapy team and rehab RN have worked together to provide customized collaborative inpatient rehab. Pain control was reasonable with prn use of oxycodone. Back incision has healed well without signs or symptoms of infection. Fluid overload has resolved on lasix 80 mg daily with weight down to 156.86 kg. He is advised go back to home regiment of lasix prn weight over 173 lbs. Diabetes was monitored with ac/hs checks and blood sugars have been in 150 range overall.  Po intake has been poor due to dislike of hospital food. He is to continue on levemir 10 units in am and titrate by 5 units to home dose of 40  units every 1-2 days if BS consistently over 150. Conservative SSI was advised due to reports of hypoglycemia PTA.  He has made good progress in rehab and is at modified independent to supervision level. Family education was done with wife who will assist as needed past discharge.    Rehab course: During patient's stay in rehab weekly team conferences were held to monitor patient's progress, set goals and discuss barriers to discharge.  Occupational therapy has focused ADL tasks, utilization of AE to adhere to back  precautions as well as endurance. Patient is modified independent for bathing and dressing tasks. He has had improved activity tolerance, improved balance, postural control but continues to have issues with left knee instability. He is independent for transfers and requires supervision for ambulating 120 feet with RW  He will continue with outpatient PT at [facility] starting 05/17/13.   Disposition: 01-Home or Self Care   Diet:  Diabetic diet.   Special Instructions: 1. No bending, twisting or arching. No Driving till cleared by MD. 2. Check blood sugars before meals and at bedtime.  Slowly increase Levemire by 5 units every 24-48 hours if blood sugars over 150 consistently.        Future Appointments Provider Department Dept Phone   05/17/2013 9:00 AM Sherlene Shams, MD [facility] [place] [phone]       Medication List    TAKE these medications       ALPRAZolam 0.25 MG tablet  Commonly known as:  XANAX  Take 0.25 mg by mouth at bedtime as needed for sleep or anxiety.     aspirin 325 MG EC tablet  Take 1 tablet (325 mg total) by mouth daily.     carvedilol 12.5 MG tablet  Commonly known as:  COREG  Take 12.5 mg by mouth 2 (two) times daily with a meal.     fish oil-omega-3 fatty acids 1000 MG capsule  Take 1 g by mouth daily.     furosemide 40 MG tablet  Commonly known as:  LASIX  Take 2 tablets (80 mg total) by mouth daily as needed (if weight is above 173 lbs).     insulin aspart 100 UNIT/ML injection  Commonly known as:  novoLOG  Inject 6 Units into the skin 3 (three) times daily before meals.     insulin detemir 100 UNIT/ML injection  Commonly known as:  LEVEMIR  Inject 0.1 mLs (10 Units total) into the skin daily.     methocarbamol 500 MG tablet  Commonly known as:  ROBAXIN  Take 1 tablet (500 mg total) by mouth every 6 (six) hours as needed. For spasms.     multivitamin with minerals Tabs  Take 1 tablet by mouth daily.      mycophenolate 500 MG tablet  Commonly known as:  CELLCEPT  Take 500 mg by mouth 2 (two) times daily.     nitroGLYCERIN 0.4 MG SL tablet  Commonly known as:  NITROSTAT  Place 0.4 mg under the tongue every 5 (five) minutes as needed for chest pain. x3 doses as needed for chest pain     oxyCODONE-acetaminophen 5-325 MG per tablet  Commonly known as:  PERCOCET/ROXICET  Take 1 tablet by mouth every 6 (six) hours as needed for pain (for severe pain). For pain     predniSONE 5 MG tablet  Commonly known as:  DELTASONE  Take 5 mg by mouth 2 (two) times daily.     pyridOXINE 100 MG tablet  Commonly known as:  VITAMIN B-6  Take 200 mg by mouth daily.     ranitidine 300 MG tablet  Commonly known as:  ZANTAC  Take 300 mg by mouth at bedtime.     senna-docusate 8.6-50 MG per tablet  Commonly known as:  Senokot-S  Take 2 tablets by mouth at bedtime. Over the counter medication for constipation.     simvastatin 80 MG tablet  Commonly known as:  ZOCOR  Take 80 mg by mouth at bedtime.     tobramycin 0.3 % ophthalmic solution  Commonly known as:  TOBREX  Place 1 drop into both eyes as needed (for allergies).     vitamin B-12 250 MCG tablet  Commonly known as:  CYANOCOBALAMIN  Take 250 mcg by mouth daily.     vitamin C 500 MG tablet  Commonly known as:  ASCORBIC ACID  Take 500 mg by mouth daily.       Follow-up Information   Call Ranelle Oyster, MD. (As needed for pain management.)    Contact information:   [address], [address] [place] [address] [phone]       Follow up with Cristi Loron, MD. Call today. (for post op appointment in 2 weeks. )    Contact information:   [address] [place] [address] [phone]       Follow up with Cassell Clement, MD. Call today. (for follow up appointmente in 7-10 days. )    Contact information:   [address] [place] [address] [phone]        Follow up with [facility], MD On 05/25/2013. (@ 9:45 am)    Contact information:   [address] [place] [address] [phone]       Signed: Jacquelynn Cree 05/04/2013, 4:26 PM

## 2013-05-03 NOTE — Progress Notes (Signed)
Patient ID: [name], male   DOB: 07-08-1946, 67 y.o.   MRN: [id number] Subjective/Complaints: Getting stronger. Pain under control. Feels ready for dc tomorrow  Review of Systems   All other systems reviewed and are negative.    Objective: Vital Signs: Blood pressure 157/82, pulse 75, temperature 98.5 F (36.9 C), temperature source Oral, resp. rate 20, height 6\' 1"  (1.854 m), weight 69.7 kg (153 lb 10.6 oz), SpO2 100.00%. No results found. Results for orders placed during the hospital encounter of 04/23/13 (from the past 72 hour(s))  GLUCOSE, CAPILLARY     Status: Abnormal   Collection Time    04/30/13 12:12 PM      Result Value Range   Glucose-Capillary 130 (*) 70 - 99 mg/dL  GLUCOSE, CAPILLARY     Status: Abnormal   Collection Time    04/30/13  4:16 PM      Result Value Range   Glucose-Capillary 153 (*) 70 - 99 mg/dL   Comment 1 Notify RN    GLUCOSE, CAPILLARY     Status: Abnormal   Collection Time    04/30/13  8:30 PM      Result Value Range   Glucose-Capillary 157 (*) 70 - 99 mg/dL   Comment 1 Notify RN    GLUCOSE, CAPILLARY     Status: Abnormal   Collection Time    05/01/13  7:27 AM      Result Value Range   Glucose-Capillary 133 (*) 70 - 99 mg/dL   Comment 1 Documented in Chart    GLUCOSE, CAPILLARY     Status: Abnormal   Collection Time    05/01/13 11:26 AM      Result Value Range   Glucose-Capillary 139 (*) 70 - 99 mg/dL   Comment 1 Documented in Chart    GLUCOSE, CAPILLARY     Status: Abnormal   Collection Time    05/01/13  4:45 PM      Result Value Range   Glucose-Capillary 169 (*) 70 - 99 mg/dL  GLUCOSE, CAPILLARY     Status: Abnormal   Collection Time    05/01/13  9:08 PM      Result Value Range   Glucose-Capillary 146 (*) 70 - 99 mg/dL  GLUCOSE, CAPILLARY     Status: None   Collection Time    05/02/13  7:32 AM      Result Value Range   Glucose-Capillary 93  70 - 99 mg/dL   Comment 1 Documented in Chart    GLUCOSE, CAPILLARY     Status:  Abnormal   Collection Time    05/02/13 11:33 AM      Result Value Range   Glucose-Capillary 130 (*) 70 - 99 mg/dL   Comment 1 Documented in Chart    GLUCOSE, CAPILLARY     Status: Abnormal   Collection Time    05/02/13  4:19 PM      Result Value Range   Glucose-Capillary 191 (*) 70 - 99 mg/dL  GLUCOSE, CAPILLARY     Status: Abnormal   Collection Time    05/02/13  8:48 PM      Result Value Range   Glucose-Capillary 223 (*) 70 - 99 mg/dL   Comment 1 Notify RN    CBC     Status: Abnormal   Collection Time    05/03/13  6:45 AM      Result Value Range   WBC 9.2  4.0 - 10.5 K/uL   RBC 3.36 (*)  4.22 - 5.81 MIL/uL   Hemoglobin 10.0 (*) 13.0 - 17.0 g/dL   HCT 16.1 (*) 09.6 - 04.5 %   MCV 92.9  78.0 - 100.0 fL   MCH 29.8  26.0 - 34.0 pg   MCHC 32.1  30.0 - 36.0 g/dL   RDW 40.9 (*) 81.1 - 91.4 %   Platelets 151  150 - 400 K/uL  BASIC METABOLIC PANEL     Status: Abnormal   Collection Time    05/03/13  6:45 AM      Result Value Range   Sodium 132 (*) 135 - 145 mEq/L   Potassium 3.5  3.5 - 5.1 mEq/L   Chloride 98  96 - 112 mEq/L   CO2 22  19 - 32 mEq/L   Glucose, Bld 132 (*) 70 - 99 mg/dL   BUN 54 (*) 6 - 23 mg/dL   Creatinine, Ser 7.82 (*) 0.50 - 1.35 mg/dL   Calcium 8.8  8.4 - 95.6 mg/dL   GFR calc non Af Amer 29 (*) >90 mL/min   GFR calc Af Amer 34 (*) >90 mL/min   Comment:            The eGFR has been calculated     using the CKD EPI equation.     This calculation has not been     validated in all clinical     situations.     eGFR's persistently     <90 mL/min signify     possible Chronic Kidney Disease.  GLUCOSE, CAPILLARY     Status: Abnormal   Collection Time    05/03/13  7:31 AM      Result Value Range   Glucose-Capillary 119 (*) 70 - 99 mg/dL    Nursing note and vitals reviewed.  Constitutional: He is oriented to person, place, and time. He appears well-developed and well-nourished.  HENT:  Head: Normocephalic and atraumatic.  Eyes: Pupils are equal, round,  and reactive to light.  Cardiovascular: Normal rate and regular rhythm.  Pulmonary/Chest: Effort normal.  Abdominal: Soft. Bowel sounds are normal.  Musculoskeletal: He exhibits edema (tr to 1+ edema right foot).  Left hip,quads non-tender Neurological: He is alert and oriented to person, place, and time.  Skin: Skin is warm. . Multiple  Seborrheic  keratoses Back incision clean.  Upper body strength 4/5 in bilateral deltoid, biceps, triceps, grip  Right lower extremity 4 minus hip flexor knee extensor tr to 1/5 in right ankle dorsiflexor , 2-3/5 APF Left lower extremity 3 minus hip flexor knee extensor and ankle dorsiflexor plantar flexor  Sensory mildly reduced in both feet--no change.      Assessment/Plan: 1. 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 Physiatrist is providing close team supervision and 24 hour management of active medical problems listed below. Physiatrist and rehab team continue to assess barriers to discharge/monitor patient progress toward functional and medical goals.  Finalize dc planning for tomorrow  FIM: FIM - Bathing Bathing Steps Patient Completed: Chest;Right upper leg;Right Arm;Left upper leg;Right lower leg (including foot);Left Arm;Abdomen;Left lower leg (including foot);Front perineal area;Buttocks Bathing: 5: Supervision: Safety issues/verbal cues  FIM - Upper Body Dressing/Undressing Upper body dressing/undressing steps patient completed: Thread/unthread right sleeve of pullover shirt/dresss;Thread/unthread left sleeve of pullover shirt/dress;Put head through opening of pull over shirt/dress;Pull shirt over trunk Upper body dressing/undressing: 6: More than reasonable amount of time FIM -  Lower Body Dressing/Undressing Lower body dressing/undressing steps patient completed: Thread/unthread right  underwear leg;Thread/unthread left underwear leg;Pull underwear up/down;Thread/unthread right pants leg;Thread/unthread left pants leg;Pull pants up/down;Don/Doff right sock;Don/Doff left sock;Don/Doff left shoe;Don/Doff right shoe Lower body dressing/undressing: 5: Supervision: Safety issues/verbal cues  FIM - Toileting Toileting steps completed by patient: Adjust clothing prior to toileting;Performs perineal hygiene;Adjust clothing after toileting Toileting Assistive Devices: Grab bar or rail for support Toileting: 5: Supervision: Safety issues/verbal cues  FIM - Diplomatic Services operational officer Devices: Elevated toilet seat;Bedside commode;Grab bars Toilet Transfers: 5-To toilet/BSC: Supervision (verbal cues/safety issues);5-From toilet/BSC: Supervision (verbal cues/safety issues)  FIM - Banker Devices: Walker;Arm rests Bed/Chair Transfer: 5: Supine > Sit: Supervision (verbal cues/safety issues);4: Bed > Chair or W/C: Min A (steadying Pt. > 75%);4: Chair or W/C > Bed: Min A (steadying Pt. > 75%)  FIM - Locomotion: Wheelchair Distance: 150 Locomotion: Wheelchair: 5: Travels 150 ft or more: maneuvers on rugs and over door sills with supervision, cueing or coaxing FIM - Locomotion: Ambulation Locomotion: Ambulation Assistive Devices: Designer, industrial/product Ambulation/Gait Assistance: 5: Supervision Locomotion: Ambulation: 2: Travels 50 - 149 ft with supervision/safety issues  Comprehension Comprehension Mode: Auditory Comprehension: 6-Follows complex conversation/direction: With extra time/assistive device  Expression Expression Mode: Verbal Expression: 6-Expresses complex ideas: With extra time/assistive device  Social Interaction Social Interaction: 6-Interacts appropriately with others with medication or extra time (anti-anxiety, antidepressant).  Problem Solving Problem Solving: 6-Solves complex problems: With extra  time  Memory Memory: 6-More than reasonable amt of time  Medical Problem List and Plan:  1. DVT Prophylaxis/Anticoagulation: Pharmaceutical: Lovenox  2. Pain Management: prn oxycodone effective.   -ice for mild quad tendon strain---improved 3. Mood: Motivated. Is feeling better past second surgery.  4. Neuropsych: This patient is capable of making decisions on his/her own behalf.  5. 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 Renal status improving with decrease in lasix and resolution of diarrhea.   -labs near baseline.  6. Dm type 2: Continue levemir --reasonable control at present 7. Large pericardial effusion without tamponade: For 2D echo in several weeks past discharge.  8. LE cellulitis: resolved. Diarrhea improving off antibiotics. 9. 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 Fluid overload improved. Continue coreg, lasix 80 mg/day  -weight steady around 70kg 10. HTN: will monitor with bid checks. Continue lasix and coreg.  11. Anemia of chronic disease: treated with IV iron and on aranesp  12. Constipation with nausea:   -maintenance program    LOS (Days) 10 A FACE TO FACE EVALUATION WAS PERFORMED  Meridian Scherger T 05/03/2013, 8:47 AM

## 2013-05-03 NOTE — Progress Notes (Signed)
Recreational Therapy Discharge Summary Patient Details  Name: [name] MRN: [id number] Date of Birth: 1946-03-25 Today's Date: 05/03/2013  Long term goals set: 1  Long term goals met: 1  Comments on progress toward goals: Pt has made excellent progress toward goal meeting Mod I level given extra time and use of AE PRN.  Pt is extremely motivated and determined to return to leisure interest and community pursuits post discharge.  Education provided on energy conservation & negotiation of obstacles.  Reasons for discharge: discharge from hospital Patient/family agrees with progress made and goals achieved: Yes  [name] 05/03/2013, 4:27 PM

## 2013-05-03 NOTE — Progress Notes (Signed)
Recreational Therapy Session Note  Patient Details  Name: [name] MRN: [id number] Date of Birth: 12/16/1945 Today's Date: 05/03/2013 Time:  1400-440 Pain: no c/o Skilled Therapeutic Interventions/Progress Updates: Session focused on community pursuits/functional mobility ambulatory level using RW with supervision.  Pt is mod I for simple TR tasks seated/standing given extra time and use of AE for task completion.  Pt continues to be limited by decreased activity tolerance.  Pt's wife present and participatory in hands on training.  Therapy/Group: Co-Treatment   Jaylean Buenaventura 05/03/2013, 4:25 PM

## 2013-05-03 NOTE — Progress Notes (Signed)
Physical Therapy Session Note  Patient Details  Name: [name] MRN: [id number] Date of Birth: 1946-10-24  Today's Date: 05/03/2013 Time: 1105-1140 Time Calculation (min): 35 min  Short Term Goals: Week 1:  PT Short Term Goal 1 (Week 1): =long term goals  Skilled Therapeutic Interventions/Progress Updates:    Wife present for family education. Reviewed environment and situations during which pt will need supervision during gait. Educated wife on positioning and safety, cues to assist pt with posture and safety. Wife provided pt with supervision for gait with RW x 60', 100'. Wife also provided supervision for car transfer, cues for optimal positioning. Pt demonstrated mobilizing in the room with RW and wife providing supervision, pt nearing modified independent. If pt does not have difficulty will make pt modified independent in room this afternoon. Spoke with other treating PT today who is to set pt up with HEP.   Therapy Documentation Precautions:  Precautions Precautions: Back;Fall Precaution Comments: left leg weaker than right leg.   Required Braces or Orthoses: Other Brace/Splint Other Brace/Splint: no back brace; Rt AFO Restrictions Weight Bearing Restrictions: No Pain: Pain Assessment Pain Assessment: 0-10 Pain Score:   3 Pain Type: Acute pain Pain Location: Leg Pain Orientation: Left Pain Descriptors / Indicators: Sore Pain Frequency: Intermittent Pain Onset: Gradual Patients Stated Pain Goal: 3 Pain Intervention(s): Repositioned Multiple Pain Sites: No  See FIM for current functional status  Therapy/Group: Individual Therapy  Wilhemina Bonito 05/03/2013, 11:52 AM

## 2013-05-03 NOTE — Progress Notes (Signed)
Social Work Patient ID: [name], male   DOB: 13-Sep-1946, 67 y.o.   MRN: [id number]  Have reviewed team conference with patient who is agreeable with continued plan to d/c tomorrow.  Wife in for family education today.  Arranging f/u tx and DME.  No concerns at this time.  Kalena Mander, LCSW

## 2013-05-03 NOTE — Progress Notes (Signed)
Occupational Therapy Discharge Summary  Patient Details  Name: [name] MRN: [id number] Date of Birth: [birth year]  Today's Date: 05/03/2013  Patient has met 10 of 10 long term goals due to improved activity tolerance, improved balance, postural control, ability to compensate for deficits, improved attention, improved awareness and improved coordination.  Pt has made good progress with bathing, dressing, toilet transfers, toileting, and shower transfers during this admission.  Pt uses AE (reacher, long handle sponge, and long handle shoe horn) to assist with bathing tasks while adhering to back precautions.  Pt continues to fatigue quickly but appropriately takes rest breaks.  Pt has been educated on energy conservation strategies and has independently incorporated them into his bathing and dressing routine. Pt's wife has observed therapies.  Patient to discharge at overall Modified Independent level.  No caregiver education performed for BADLs secondary to patient at modified independent level, but as stated above she has observed therapies.   Recommendation: No additional occupational therapy recommended at this time secondary to patient at an overall mod I level.  Equipment: No equipment provided Pt lives in handicap accessible home and has elevated toilet seat and bench in walk in shower.  Reasons for discharge: treatment goals met and discharge from hospital  Patient/family agrees with progress made and goals achieved: Yes  Pain Pain Assessment Pain Score:   2 Pain Type: Acute pain Pain Location: Leg Pain Orientation: Left Pain Descriptors / Indicators: Sore Pain Onset: Gradual Pain Intervention(s): RN made aware;Repositioned ADL ADL Equipment Provided: Reacher;Sock aid;Long-handled shoe horn;Long-handled sponge Eating: Independent Grooming: Independent Upper Body Bathing: Modified independent Where Assessed-Upper Body Bathing: Shower Lower Body Bathing: Modified  independent Where Assessed-Lower Body Bathing: Shower Upper Body Dressing: Independent Lower Body Dressing: Modified independent Where Assessed-Lower Body Dressing: Chair Toileting: Modified independent Where Assessed-Toileting: Teacher, adult education: Engineer, agricultural Method: Event organiser: Distant supervision Film/video editor Method: Designer, industrial/product: Information systems manager with back Vision/Perception  Vision - History Baseline Vision: No visual deficits Visual History: Cataracts Patient Visual Report: No change from baseline Vision - Assessment Eye Alignment: Within Functional Limits Vision Assessment: Vision not tested Perception Perception: Within Functional Limits Praxis Praxis: Intact  Cognition Overall Cognitive Status: Within Functional Limits for tasks assessed Arousal/Alertness: Awake/alert Orientation Level: Oriented X4   Motor  Motor Motor: Within Functional Limits    Trunk/Postural Assessment  Cervical Assessment Cervical Assessment: Within Functional Limits Thoracic Assessment Thoracic Assessment: Within Functional Limits Lumbar Assessment Lumbar Assessment: Within Functional Limits Postural Control Postural Control: Within Functional Limits  Balance Static Sitting Balance Static Sitting - Balance Support: Feet supported Static Sitting - Level of Assistance: 7: Independent Extremity/Trunk Assessment RUE Assessment RUE Assessment: Within Functional Limits LUE Assessment LUE Assessment: Within Functional Limits  See FIM for current functional status  Rich Brave 05/03/2013, 9:02 AM

## 2013-05-03 NOTE — Progress Notes (Signed)
Physical Therapy Note  Patient Details  Name: [name] MRN: [id number] Date of Birth: [birth year] Today's Date: 05/03/2013  Time: 1300-1345 45 minutes  1:1 Pt c/o pain in L LE with stair training, eased with rest.  RN made aware of pt's request for pain meds.  W/c mobility >150' with 1 seated rest break.  Stair negotiation with B handrails and supervision x 5 stairs.  Step ups to 2'' step to simulate going onto patio at home.  Pt performed multiple repetitions with each LE leading with supervision.  Standing therex for B LE strengthening 10 x hip abd, marching, heel raises, mini squats with prolonged seated rests between due to fatigue.  Gait with RW 150' with supervision.  Pt with poor activity tolerance but good motivation to participate, ready to d/c home.   Danielly Ackerley 05/03/2013, 2:33 PM

## 2013-05-03 NOTE — Progress Notes (Signed)
Occupational Therapy Session Note  Patient Details  Name: ABRAR BILTON MRN: [id number] Date of Birth: 1946/04/16  Today's Date: 05/03/2013 Time: 0800-0856 Time Calculation (min): 56 min  Short Term Goals: Week 2:   STG=LTG  Skilled Therapeutic Interventions/Progress Updates:    Pt engaged in bathing at shower level and dressing with sit<>stand from recliner in room.  Pt amb in room with RW to gather clothing and walk to bathroom to use toilet before entering shower.  Pt completed all bathing and dressing tasks without assistance using AE as appropriate.  Pt is mod I for toilet transfers and toileting, bathing and UB/LB dressing. Focus on activity tolerance, functional ambulaiton with RW for home mgmt tasks, dynamic standing balance, and safety awareness.  Therapy Documentation Precautions:  Precautions Precautions: Back;Fall Precaution Comments: left leg weaker than right leg.   Required Braces or Orthoses: Other Brace/Splint Other Brace/Splint: no back brace; Rt AFO Restrictions Weight Bearing Restrictions: No Pain: Pain Assessment Pain Score:   2 Pain Type: Acute pain Pain Location: Leg Pain Orientation: Left Pain Descriptors / Indicators: Sore Pain Onset: Gradual Pain Intervention(s): RN made aware;Repositioned See FIM for current functional status  Therapy/Group: Individual Therapy  Rich Brave 05/03/2013, 9:00 AM

## 2013-05-03 NOTE — Progress Notes (Signed)
NUTRITION FOLLOW UP  DOCUMENTATION CODES  Per approved criteria   -Non-severe (moderate) malnutrition in the context of chronic illness    Intervention:   Continue current interventions.  Nutrition Dx:   Inadequate oral intake related to food preferences as evidenced by pt report. Improving.  Goal:   Intake to meet >90% of estimated nutrition needs. Met.  Monitor:   weight trends, lab trends, I/O's, PO intake  Assessment:   PMHx significant for renal transplant (pt is s/p tx x 40 years - oldest living renal transplant recipient in state), ICD/pacemaker, CHF, DM. S/p recent back surgery 2 weeks ago for L3 decompression discectomy. Admitted 5/26 with AMS, low-grade fever, RLE cellulitis and SOB. Pt was diuresed per renal. CT of knee showed joint effusion with edema, possibly 2/2 cellulitis. CT of spine showed air-fluid collection s/p recent surgery. Underwent redo microdisckectomy.  D/c date planned for 6/20. Continues on Carbohydrate Modified Medium diet with 2 gram sodium restriction. Pt consuming 10-50% of meals per chart, however pt reports that he is eating very well and appetite is improving daily. Pt is ready to go home and have his wife prepare healthy foods for him.  Pt meets criteria for moderate MALNUTRITION in the context of chronic illness as evidenced by 15% wt loss x 7 months and intake of <75% of estimated energy needs x at least 1 month.  Height: Ht Readings from Last 1 Encounters:  04/23/13 6\' 1"  (1.854 m)    Weight Status:   Wt Readings from Last 1 Encounters:  05/02/13 153 lb 10.6 oz (69.7 kg)  Admit wt 157 lb - stable  Re-estimated needs:  Kcal: 1700 - 1900 Protein: 71 - 85 g Fluid: 1.7 - 1.9 liters  Skin:  Edema to R foot  Back incisions  Skin tears  Diet Order: Carb Control Medium (1600 - 2000)   Intake/Output Summary (Last 24 hours) at 05/03/13 1039 Last data filed at 05/02/13 2102  Gross per 24 hour  Intake    600 ml  Output    800 ml  Net    -200 ml    Last BM: 6/16   Labs:   Recent Labs Lab 04/27/13 0505 04/28/13 0550 04/29/13 0425 04/30/13 0521 05/03/13 0645  NA 135 136 134*  --  132*  K 3.7 4.4 3.8  --  3.5  CL 99 100 99  --  98  CO2 24 22 24   --  22  BUN 45* 49* 49*  --  54*  CREATININE 2.11* 2.16* 2.22* 2.07* 2.24*  CALCIUM 9.0 9.0 8.7  --  8.8  PHOS 4.6 5.4* 4.6  --   --   GLUCOSE 146* 150* 180*  --  132*    CBG (last 3)   Recent Labs  05/02/13 1619 05/02/13 2048 05/03/13 0731  GLUCAP 191* 223* 119*    Scheduled Meds: . antiseptic oral rinse  15 mL Mouth Rinse BID  . aspirin EC  325 mg Oral Daily  . atorvastatin  40 mg Oral q1800  . carvedilol  12.5 mg Oral BID WC  . darbepoetin (ARANESP) injection - NON-DIALYSIS  100 mcg Subcutaneous Q Thu-1800  . enoxaparin (LOVENOX) injection  30 mg Subcutaneous Q24H  . famotidine  20 mg Oral Daily  . feeding supplement  237 mL Oral TID WC  . furosemide  80 mg Oral Daily  . hydrocerin   Topical BID  . insulin aspart  0-5 Units Subcutaneous QHS  . insulin aspart  0-9 Units  Subcutaneous TID WC  . insulin detemir  10 Units Subcutaneous Daily  . mycophenolate  500 mg Oral BID  . omega-3 acid ethyl esters  1 g Oral Daily  . predniSONE  5 mg Oral BID  . pyridOXINE  200 mg Oral Daily  . senna-docusate  2 tablet Oral QHS  . vitamin B-12  250 mcg Oral Daily  . vitamin C  500 mg Oral Daily    Continuous Infusions:  none  Jarold Motto MS, RD, LDN Pager: [phone] After-hours pager: [phone]

## 2013-05-03 NOTE — Progress Notes (Signed)
Physical Therapy Discharge Summary  Patient Details  Name: [name] MRN: [id number] Date of Birth: [birth year]  Today's Date: 05/03/2013 Time: 1400-1500 Time Calculation (min): 60 min  Session working on completing family education and prepare for community re-entry. Outdoor mobility with RW, wife providing supervision for pt while ambulating over uneven brick and paved terrain, negotiating curb step, and safely mobilizing over variety of surface changes back into hospital building. Sit <> stand from community low compliant seat with supervision. Discussed D/C home and safety in the home again.  Pt and wife have no further questions  Patient has met 10 of 10 long term goals due to improved activity tolerance, improved balance, improved postural control, increased strength and ability to compensate for deficits.  Patient to discharge at an ambulatory level Supervision/modified independent. Pt has made great gains with functional strength however still demonstrates excessive UE reliance with mobility due to bil. LE weakness (Lt>Rt functionally) and decreased balance. Patient's care partner is independent to provide the necessary physical assistance at discharge.  Reasons goals not met: NA  Recommendation:  Patient will benefit from ongoing skilled PT services in outpatient setting to continue to advance safe functional mobility, address ongoing impairments in balance, weakness, reliance on RW, and minimize fall risk.  Equipment: No equipment provided  Reasons for discharge: treatment goals met and discharge from hospital  Patient/family agrees with progress made and goals achieved: Yes  PT Discharge Precautions/Restrictions Restrictions Weight Bearing Restrictions: No Pain Pain Assessment Pain Assessment: 0-10 Pain Score:   5 Pain Type: Acute pain Pain Location: Leg Pain Orientation: Left Pain Descriptors / Indicators: Sore Pain Frequency: Intermittent Pain Onset:  Gradual Patients Stated Pain Goal: 3 Pain Intervention(s): Rest Multiple Pain Sites: No  Cognition Overall Cognitive Status: Within Functional Limits for tasks assessed Arousal/Alertness: Awake/alert Orientation Level: Oriented X4 Sensation Sensation Light Touch: Impaired Detail Light Touch Impaired Details: Impaired RLE (but pt reports no difficulties) Proprioception: Appears Intact Coordination Gross Motor Movements are Fluid and Coordinated: Yes  Mobility Bed Mobility Rolling Right: 6: Modified independent (Device/Increase time) Right Sidelying to Sit: 6: Modified independent (Device/Increase time) Sit to Supine: 6: Modified independent (Device/Increase time) Transfers Sit to Stand: 6: Modified independent (Device/Increase time) Stand to Sit: 6: Modified independent (Device/Increase time) Stand Pivot Transfers: 6: Modified independent (Device/Increase time) Locomotion  Ambulation Ambulation: Yes Ambulation/Gait Assistance: 5: Supervision Ambulation Distance (Feet): 120 Feet Assistive device: Rolling walker Ambulation/Gait Assistance Details: Ambulation with Rt. AFO donned, cues for upright posture and decreased UE relience. Pt continues with Lt. knee hyperextension during mid to late stance on Lt. however does not like cues about Lt. knee control because he reports it causes him to "over think it." Gait Gait: Yes Gait Pattern: Impaired Gait Pattern: Step-through pattern;Decreased stride length;Decreased dorsiflexion - right;Decreased dorsiflexion - left;Trunk flexed;Decreased hip/knee flexion - left;Decreased hip/knee flexion - right Stairs / Additional Locomotion Stairs: Yes Stairs Assistance: 5: Supervision Curb: 5: Programme researcher, broadcasting/film/video: Yes Wheelchair Assistance: 6: Modified independent (Device/Increase time) Occupational hygienist: Both upper extremities Wheelchair Parts Management: Needs assistance (for leg rests if going long  distances)   Balance  Decreased dynamic balance particularly without UE support secondary to very weak bil. LEs  Extremity Assessment      RLE Assessment RLE Assessment: Exceptions to [facility] RLE AROM (degrees) RLE Overall AROM Comments: Decreased dorsiflexion RLE Strength Right Hip Flexion: 3-/5 Right Knee Flexion: 3/5 Right Knee Extension: 3+/5 Right Ankle Dorsiflexion: 1/5 LLE Assessment LLE Assessment: Exceptions to [facility] LLE Strength  Left Hip Flexion: 3-/5 Left Knee Flexion: 3/5 Left Knee Extension: 3/5 Left Ankle Dorsiflexion: 3/5  See FIM for current functional status  Wilhemina Bonito 05/03/2013, 3:15 PM

## 2013-05-04 DIAGNOSIS — R5381 Other malaise: Secondary | ICD-10-CM

## 2013-05-04 LAB — GLUCOSE, CAPILLARY: Glucose-Capillary: 162 mg/dL — ABNORMAL HIGH (ref 70–99)

## 2013-05-04 MED ORDER — FUROSEMIDE 40 MG PO TABS: 80.0000 mg | ORAL_TABLET | Freq: Every day | ORAL | Status: AC | PRN

## 2013-05-04 MED ORDER — OXYCODONE-ACETAMINOPHEN 5-325 MG PO TABS: 1.0000 | ORAL_TABLET | Freq: Four times a day (QID) | ORAL | Status: DC | PRN

## 2013-05-04 MED ORDER — INSULIN DETEMIR 100 UNIT/ML ~~LOC~~ SOLN: 10.0000 [IU] | Freq: Every day | SUBCUTANEOUS | Status: DC

## 2013-05-04 MED ORDER — METHOCARBAMOL 500 MG PO TABS: 500.0000 mg | ORAL_TABLET | Freq: Four times a day (QID) | ORAL | Status: DC | PRN

## 2013-05-04 MED ORDER — FAMOTIDINE 20 MG PO TABS: 20.0000 mg | ORAL_TABLET | Freq: Every day | ORAL | Status: DC

## 2013-05-04 MED ORDER — OXYCODONE HCL 5 MG PO TABS
10.0000 mg | ORAL_TABLET | Freq: Once | ORAL | Status: AC
  Administered 2013-05-04: 10 mg via ORAL
  Filled 2013-05-04: qty 2

## 2013-05-04 MED ORDER — SENNOSIDES-DOCUSATE SODIUM 8.6-50 MG PO TABS: 2.0000 | ORAL_TABLET | Freq: Every day | ORAL | Status: DC

## 2013-05-04 MED ORDER — ASPIRIN 325 MG PO TBEC: 325.0000 mg | DELAYED_RELEASE_TABLET | Freq: Every day | ORAL | Status: DC

## 2013-05-04 NOTE — Progress Notes (Signed)
Social Work  Discharge Note  The overall goal for the admission was met for:   Discharge location: Yes - home with wife to assist as needed  Length of Stay: Yes - 11 days  Discharge activity level: Yes - mod i to supervision  Home/community participation: Yes  Services provided included: MD, RD, PT, OT, RN, TR, Pharmacy and SW  Financial Services: Private Insurance: [employer]  Follow-up services arranged: Outpatient: PT via [facility] and Patient/Family request agency HH: OP via [facility], DME: NA  Comments (or additional information):  Patient/Family verbalized understanding of follow-up arrangements: Yes  Individual responsible for coordination of the follow-up plan: patient  Confirmed correct DME delivered: NA    [name]

## 2013-05-04 NOTE — Progress Notes (Signed)
Patient ID: ARDIT DANH, male   DOB: [birth year], 67 y.o.   MRN: [id number] Subjective/Complaints: Excited to leave. Feels ready  Review of Systems   All other systems reviewed and are negative.    Objective: Vital Signs: Blood pressure 139/74, pulse 67, temperature 98 F (36.7 C), temperature source Oral, resp. rate 19, height 6\' 1"  (1.854 m), weight 71.3 kg (157 lb 3 oz), SpO2 100.00%. No results found. Results for orders placed during the hospital encounter of 04/23/13 (from the past 72 hour(s))  GLUCOSE, CAPILLARY     Status: Abnormal   Collection Time    05/01/13 11:26 AM      Result Value Range   Glucose-Capillary 139 (*) 70 - 99 mg/dL   Comment 1 Documented in Chart    GLUCOSE, CAPILLARY     Status: Abnormal   Collection Time    05/01/13  4:45 PM      Result Value Range   Glucose-Capillary 169 (*) 70 - 99 mg/dL  GLUCOSE, CAPILLARY     Status: Abnormal   Collection Time    05/01/13  9:08 PM      Result Value Range   Glucose-Capillary 146 (*) 70 - 99 mg/dL  GLUCOSE, CAPILLARY     Status: None   Collection Time    05/02/13  7:32 AM      Result Value Range   Glucose-Capillary 93  70 - 99 mg/dL   Comment 1 Documented in Chart    GLUCOSE, CAPILLARY     Status: Abnormal   Collection Time    05/02/13 11:33 AM      Result Value Range   Glucose-Capillary 130 (*) 70 - 99 mg/dL   Comment 1 Documented in Chart    GLUCOSE, CAPILLARY     Status: Abnormal   Collection Time    05/02/13  4:19 PM      Result Value Range   Glucose-Capillary 191 (*) 70 - 99 mg/dL  GLUCOSE, CAPILLARY     Status: Abnormal   Collection Time    05/02/13  8:48 PM      Result Value Range   Glucose-Capillary 223 (*) 70 - 99 mg/dL   Comment 1 Notify RN    CBC     Status: Abnormal   Collection Time    05/03/13  6:45 AM      Result Value Range   WBC 9.2  4.0 - 10.5 K/uL   RBC 3.36 (*) 4.22 - 5.81 MIL/uL   Hemoglobin 10.0 (*) 13.0 - 17.0 g/dL   HCT 78.2 (*) 95.6 - 21.3 %   MCV 92.9  78.0 - 100.0  fL   MCH 29.8  26.0 - 34.0 pg   MCHC 32.1  30.0 - 36.0 g/dL   RDW 08.6 (*) 57.8 - 46.9 %   Platelets 151  150 - 400 K/uL  BASIC METABOLIC PANEL     Status: Abnormal   Collection Time    05/03/13  6:45 AM      Result Value Range   Sodium 132 (*) 135 - 145 mEq/L   Potassium 3.5  3.5 - 5.1 mEq/L   Chloride 98  96 - 112 mEq/L   CO2 22  19 - 32 mEq/L   Glucose, Bld 132 (*) 70 - 99 mg/dL   BUN 54 (*) 6 - 23 mg/dL   Creatinine, Ser 6.29 (*) 0.50 - 1.35 mg/dL   Calcium 8.8  8.4 - 52.8 mg/dL   GFR calc non Af Denyse Dago  29 (*) >90 mL/min   GFR calc Af Amer 34 (*) >90 mL/min   Comment:            The eGFR has been calculated     using the CKD EPI equation.     This calculation has not been     validated in all clinical     situations.     eGFR's persistently     <90 mL/min signify     possible Chronic Kidney Disease.  GLUCOSE, CAPILLARY     Status: Abnormal   Collection Time    05/03/13  7:31 AM      Result Value Range   Glucose-Capillary 119 (*) 70 - 99 mg/dL  GLUCOSE, CAPILLARY     Status: Abnormal   Collection Time    05/03/13 11:45 AM      Result Value Range   Glucose-Capillary 135 (*) 70 - 99 mg/dL  GLUCOSE, CAPILLARY     Status: Abnormal   Collection Time    05/03/13  4:47 PM      Result Value Range   Glucose-Capillary 148 (*) 70 - 99 mg/dL   Comment 1 Notify RN    GLUCOSE, CAPILLARY     Status: Abnormal   Collection Time    05/03/13  8:50 PM      Result Value Range   Glucose-Capillary 233 (*) 70 - 99 mg/dL  GLUCOSE, CAPILLARY     Status: Abnormal   Collection Time    05/04/13  7:22 AM      Result Value Range   Glucose-Capillary 141 (*) 70 - 99 mg/dL   Comment 1 Notify RN      Nursing note and vitals reviewed.  Constitutional: He is oriented to person, place, and time. He appears well-developed and well-nourished.  HENT:  Head: Normocephalic and atraumatic.  Eyes: Pupils are equal, round, and reactive to light.  Cardiovascular: Normal rate and regular rhythm.   Pulmonary/Chest: Effort normal.  Abdominal: Soft. Bowel sounds are normal.  Musculoskeletal: He exhibits edema (tr to 1+ edema right foot).  Left hip,quads non-tender Neurological: He is alert and oriented to person, place, and time.  Skin: Skin is warm. . Multiple  Seborrheic  keratoses Back incision clean.  Upper body strength 4/5 in bilateral deltoid, biceps, triceps, grip  Right lower extremity 4 minus hip flexor knee extensor tr to 1/5 in right ankle dorsiflexor , 2-3/5 APF Left lower extremity 3 minus hip flexor knee extensor and ankle dorsiflexor plantar flexor  Sensory mildly reduced in both feet--no change.      Assessment/Plan: 1. 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 Physiatrist is providing close team supervision and 24 hour management of active medical problems listed below. Physiatrist and rehab team continue to assess barriers to discharge/monitor patient progress toward functional and medical goals.  Dc today. Follow up with pcp and NS. i can see as needed for pain/rehab needs  FIM: FIM - Bathing Bathing Steps Patient Completed: Chest;Right upper leg;Right Arm;Left upper leg;Right lower leg (including foot);Left Arm;Abdomen;Left lower leg (including foot);Front perineal area;Buttocks Bathing: 6: Assistive device (Comment)  FIM - Upper Body Dressing/Undressing Upper body dressing/undressing steps patient completed: Thread/unthread right sleeve of pullover shirt/dresss;Thread/unthread left sleeve of pullover shirt/dress;Put head through opening of pull over shirt/dress;Pull shirt over trunk Upper body dressing/undressing: 7: Complete Independence: No helper FIM - Lower Body Dressing/Undressing Lower body dressing/undressing  steps patient completed: Thread/unthread right underwear leg;Thread/unthread left underwear  leg;Pull underwear up/down;Thread/unthread right pants leg;Thread/unthread left pants leg;Pull pants up/down;Don/Doff right sock;Don/Doff left sock;Don/Doff left shoe;Don/Doff right shoe;Fasten/unfasten right shoe Lower body dressing/undressing: 5: Supervision: Safety issues/verbal cues  FIM - Toileting Toileting steps completed by patient: Adjust clothing prior to toileting;Performs perineal hygiene;Adjust clothing after toileting Toileting Assistive Devices: Grab bar or rail for support Toileting: 6: More than reasonable amount of time  FIM - Diplomatic Services operational officer Devices: Grab bars;Elevated toilet seat Toilet Transfers: 5-To toilet/BSC: Supervision (verbal cues/safety issues);5-From toilet/BSC: Supervision (verbal cues/safety issues)  FIM - Banker Devices: Walker;Arm rests Bed/Chair Transfer: 6: Supine > Sit: No assist;7: Sit > Supine: No assist;6: Bed > Chair or W/C: No assist;6: Chair or W/C > Bed: No assist  FIM - Locomotion: Wheelchair Distance: 150 Locomotion: Wheelchair: 6: Travels 150 ft or more, turns around, maneuvers to table, bed or toilet, negotiates 3% grade: maneuvers on rugs and over door sills independently FIM - Locomotion: Ambulation Locomotion: Ambulation Assistive Devices: Designer, industrial/product Ambulation/Gait Assistance: 5: Supervision Locomotion: Ambulation: 5: Travels 150 ft or more with supervision/safety issues  Comprehension Comprehension Mode: Auditory Comprehension: 6-Follows complex conversation/direction: With extra time/assistive device  Expression Expression Mode: Verbal Expression: 6-Expresses complex ideas: With extra time/assistive device  Social Interaction Social Interaction: 6-Interacts appropriately with others with medication or extra time (anti-anxiety, antidepressant).  Problem Solving Problem Solving: 6-Solves complex problems: With extra time  Memory Memory: 6-More than  reasonable amt of time  Medical Problem List and Plan:  1. DVT Prophylaxis/Anticoagulation: Pharmaceutical: Lovenox  2. Pain Management: prn oxycodone effective.   -ice for mild quad tendon strain---improved 3. Mood: Motivated. Is feeling better past second surgery.  4. Neuropsych: This patient is capable of making decisions on his/her own behalf.  5. 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 Renal status improving with decrease in lasix and resolution of diarrhea.   -labs near baseline.  6. Dm type 2: Continue levemir --reasonable control at present 7. Large pericardial effusion without tamponade: For 2D echo in several weeks past discharge.  8. LE cellulitis: resolved. Diarrhea improving off antibiotics. 9. 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 Fluid overload improved. Continue coreg, lasix 80 mg/day  -weight steady around 70kg--follow up with pcp 10. HTN: will monitor with bid checks. Continue lasix and coreg.  11. Anemia of chronic disease: treated with IV iron and on aranesp  12. Constipation with nausea:   -maintenance program    LOS (Days) 11 A FACE TO FACE EVALUATION WAS PERFORMED  Carmino Ocain T 05/04/2013, 8:35 AM

## 2013-05-17 ENCOUNTER — Ambulatory Visit: Payer: PRIVATE HEALTH INSURANCE | Admitting: Internal Medicine

## 2013-05-17 ENCOUNTER — Encounter: Payer: Self-pay | Admitting: Physical Medicine & Rehabilitation

## 2013-06-15 ENCOUNTER — Encounter: Payer: Self-pay | Admitting: Physical Medicine & Rehabilitation

## 2013-06-22 ENCOUNTER — Other Ambulatory Visit: Payer: Self-pay | Admitting: Neurosurgery

## 2013-06-22 DIAGNOSIS — M4800 Spinal stenosis, site unspecified: Secondary | ICD-10-CM

## 2013-06-28 ENCOUNTER — Ambulatory Visit
Admission: RE | Admit: 2013-06-28 | Discharge: 2013-06-28 | Disposition: A | Discharge status: Home / Self Care | Payer: PRIVATE HEALTH INSURANCE | Source: Ambulatory Visit | Attending: Neurosurgery | Admitting: Neurosurgery

## 2013-06-28 VITALS — BP 132/70 | HR 70

## 2013-06-28 DIAGNOSIS — M4800 Spinal stenosis, site unspecified: Secondary | ICD-10-CM

## 2013-06-28 DIAGNOSIS — M5126 Other intervertebral disc displacement, lumbar region: Secondary | ICD-10-CM

## 2013-06-28 DIAGNOSIS — Z9889 Other specified postprocedural states: Secondary | ICD-10-CM

## 2013-06-28 MED ORDER — IOHEXOL 180 MG/ML  SOLN
15.0000 mL | Freq: Once | INTRAMUSCULAR | Status: AC | PRN
  Administered 2013-06-28: 15 mL via INTRATHECAL

## 2013-06-28 MED ORDER — MEPERIDINE HCL 100 MG/ML IJ SOLN
75.0000 mg | Freq: Once | INTRAMUSCULAR | Status: AC
  Administered 2013-06-28: 75 mg via INTRAMUSCULAR

## 2013-06-28 MED ORDER — OXYCODONE-ACETAMINOPHEN 5-325 MG PO TABS
2.0000 | ORAL_TABLET | Freq: Once | ORAL | Status: AC
  Administered 2013-06-28: 2 via ORAL

## 2013-06-28 MED ORDER — DIAZEPAM 5 MG PO TABS
10.0000 mg | ORAL_TABLET | Freq: Once | ORAL | Status: AC
  Administered 2013-06-28: 10 mg via ORAL

## 2013-06-28 MED ORDER — ONDANSETRON HCL 4 MG/2ML IJ SOLN
4.0000 mg | Freq: Once | INTRAMUSCULAR | Status: AC
  Administered 2013-06-28: 4 mg via INTRAMUSCULAR

## 2013-07-16 ENCOUNTER — Encounter: Payer: Self-pay | Admitting: Physical Medicine & Rehabilitation

## 2013-08-15 ENCOUNTER — Encounter: Payer: Self-pay | Admitting: Physical Medicine & Rehabilitation

## 2013-08-30 ENCOUNTER — Inpatient Hospital Stay: Payer: Self-pay | Admitting: Internal Medicine

## 2013-08-30 LAB — COMPREHENSIVE METABOLIC PANEL
Anion Gap: 7 (ref 7–16)
Calcium, Total: 8.6 mg/dL (ref 8.5–10.1)
Chloride: 109 mmol/L — ABNORMAL HIGH (ref 98–107)
Co2: 25 mmol/L (ref 21–32)
Creatinine: 3.03 mg/dL — ABNORMAL HIGH (ref 0.60–1.30)
EGFR (African American): 24 — ABNORMAL LOW
EGFR (Non-African Amer.): 21 — ABNORMAL LOW
Osmolality: 301 (ref 275–301)
SGOT(AST): 21 U/L (ref 15–37)
SGPT (ALT): 25 U/L (ref 12–78)
Sodium: 141 mmol/L (ref 136–145)
Total Protein: 5.3 g/dL — ABNORMAL LOW (ref 6.4–8.2)

## 2013-08-30 LAB — CBC WITH DIFFERENTIAL/PLATELET
Eosinophil #: 0.1 10*3/uL (ref 0.0–0.7)
Eosinophil %: 0.5 %
MCH: 29.8 pg (ref 26.0–34.0)
MCV: 91 fL (ref 80–100)
Monocyte #: 1.2 x10 3/mm — ABNORMAL HIGH (ref 0.2–1.0)
Monocyte %: 9.4 %
Neutrophil #: 11.5 10*3/uL — ABNORMAL HIGH (ref 1.4–6.5)
Neutrophil %: 87.9 %
Platelet: 121 10*3/uL — ABNORMAL LOW (ref 150–440)
RDW: 16.7 % — ABNORMAL HIGH (ref 11.5–14.5)
WBC: 13.1 10*3/uL — ABNORMAL HIGH (ref 3.8–10.6)

## 2013-08-30 LAB — URINALYSIS, COMPLETE
Bacteria: NONE SEEN
Bilirubin,UR: NEGATIVE
Ketone: NEGATIVE
Leukocyte Esterase: NEGATIVE
Nitrite: NEGATIVE
Protein: >=500
RBC,UR: 1 /HPF (ref 0–5)
Specific Gravity: 1.012 (ref 1.003–1.030)
Squamous Epithelial: NONE SEEN

## 2013-08-30 LAB — CK-MB: CK-MB: 5 ng/mL — ABNORMAL HIGH (ref 0.5–3.6)

## 2013-08-30 LAB — TROPONIN I: Troponin-I: 0.28 ng/mL — ABNORMAL HIGH

## 2013-08-31 ENCOUNTER — Encounter (HOSPITAL_COMMUNITY): Payer: Self-pay | Admitting: General Practice

## 2013-08-31 ENCOUNTER — Inpatient Hospital Stay (HOSPITAL_COMMUNITY)
Admission: AD | Admit: 2013-08-31 | Discharge: 2013-09-02 | DRG: 603 | Disposition: A | Discharge status: Home / Self Care | Payer: Medicare Other | Source: Other Acute Inpatient Hospital | Attending: Internal Medicine | Admitting: Internal Medicine

## 2013-08-31 DIAGNOSIS — R7989 Other specified abnormal findings of blood chemistry: Secondary | ICD-10-CM

## 2013-08-31 DIAGNOSIS — R6510 Systemic inflammatory response syndrome (SIRS) of non-infectious origin without acute organ dysfunction: Secondary | ICD-10-CM

## 2013-08-31 DIAGNOSIS — D631 Anemia in chronic kidney disease: Secondary | ICD-10-CM | POA: Diagnosis present

## 2013-08-31 DIAGNOSIS — L0390 Cellulitis, unspecified: Secondary | ICD-10-CM | POA: Diagnosis present

## 2013-08-31 DIAGNOSIS — I252 Old myocardial infarction: Secondary | ICD-10-CM

## 2013-08-31 DIAGNOSIS — I248 Other forms of acute ischemic heart disease: Secondary | ICD-10-CM

## 2013-08-31 DIAGNOSIS — IMO0002 Reserved for concepts with insufficient information to code with codable children: Principal | ICD-10-CM | POA: Diagnosis present

## 2013-08-31 DIAGNOSIS — Z9889 Other specified postprocedural states: Secondary | ICD-10-CM

## 2013-08-31 DIAGNOSIS — E119 Type 2 diabetes mellitus without complications: Secondary | ICD-10-CM | POA: Diagnosis present

## 2013-08-31 DIAGNOSIS — C4491 Basal cell carcinoma of skin, unspecified: Secondary | ICD-10-CM | POA: Diagnosis present

## 2013-08-31 DIAGNOSIS — Z940 Kidney transplant status: Secondary | ICD-10-CM

## 2013-08-31 DIAGNOSIS — I10 Essential (primary) hypertension: Secondary | ICD-10-CM | POA: Diagnosis present

## 2013-08-31 DIAGNOSIS — Z833 Family history of diabetes mellitus: Secondary | ICD-10-CM

## 2013-08-31 DIAGNOSIS — L03115 Cellulitis of right lower limb: Secondary | ICD-10-CM

## 2013-08-31 DIAGNOSIS — N189 Chronic kidney disease, unspecified: Secondary | ICD-10-CM

## 2013-08-31 DIAGNOSIS — Z9861 Coronary angioplasty status: Secondary | ICD-10-CM

## 2013-08-31 DIAGNOSIS — Z95810 Presence of automatic (implantable) cardiac defibrillator: Secondary | ICD-10-CM

## 2013-08-31 DIAGNOSIS — M129 Arthropathy, unspecified: Secondary | ICD-10-CM | POA: Diagnosis present

## 2013-08-31 DIAGNOSIS — M009 Pyogenic arthritis, unspecified: Secondary | ICD-10-CM

## 2013-08-31 DIAGNOSIS — M5126 Other intervertebral disc displacement, lumbar region: Secondary | ICD-10-CM

## 2013-08-31 DIAGNOSIS — Z85828 Personal history of other malignant neoplasm of skin: Secondary | ICD-10-CM

## 2013-08-31 DIAGNOSIS — Z8551 Personal history of malignant neoplasm of bladder: Secondary | ICD-10-CM

## 2013-08-31 DIAGNOSIS — Z808 Family history of malignant neoplasm of other organs or systems: Secondary | ICD-10-CM

## 2013-08-31 DIAGNOSIS — I2589 Other forms of chronic ischemic heart disease: Secondary | ICD-10-CM | POA: Diagnosis present

## 2013-08-31 DIAGNOSIS — D696 Thrombocytopenia, unspecified: Secondary | ICD-10-CM | POA: Diagnosis present

## 2013-08-31 DIAGNOSIS — I2510 Atherosclerotic heart disease of native coronary artery without angina pectoris: Secondary | ICD-10-CM

## 2013-08-31 DIAGNOSIS — Z87891 Personal history of nicotine dependence: Secondary | ICD-10-CM

## 2013-08-31 DIAGNOSIS — E860 Dehydration: Secondary | ICD-10-CM

## 2013-08-31 DIAGNOSIS — Z823 Family history of stroke: Secondary | ICD-10-CM

## 2013-08-31 DIAGNOSIS — L0291 Cutaneous abscess, unspecified: Secondary | ICD-10-CM

## 2013-08-31 DIAGNOSIS — E1169 Type 2 diabetes mellitus with other specified complication: Secondary | ICD-10-CM | POA: Diagnosis present

## 2013-08-31 DIAGNOSIS — N179 Acute kidney failure, unspecified: Secondary | ICD-10-CM

## 2013-08-31 DIAGNOSIS — Z794 Long term (current) use of insulin: Secondary | ICD-10-CM

## 2013-08-31 DIAGNOSIS — I509 Heart failure, unspecified: Secondary | ICD-10-CM | POA: Diagnosis present

## 2013-08-31 DIAGNOSIS — I313 Pericardial effusion (noninflammatory): Secondary | ICD-10-CM

## 2013-08-31 DIAGNOSIS — G8929 Other chronic pain: Secondary | ICD-10-CM | POA: Diagnosis present

## 2013-08-31 DIAGNOSIS — E11649 Type 2 diabetes mellitus with hypoglycemia without coma: Secondary | ICD-10-CM | POA: Diagnosis not present

## 2013-08-31 DIAGNOSIS — J9601 Acute respiratory failure with hypoxia: Secondary | ICD-10-CM

## 2013-08-31 HISTORY — DX: Cellulitis, unspecified: L03.90

## 2013-08-31 LAB — CBC WITH DIFFERENTIAL/PLATELET
Basophil #: 0.1 10*3/uL (ref 0.0–0.1)
Basophil %: 0.6 %
Eosinophil #: 0.1 10*3/uL (ref 0.0–0.7)
Eosinophil %: 0.7 %
HCT: 24.6 % — ABNORMAL LOW (ref 40.0–52.0)
Lymphocyte %: 3.3 %
MCH: 29.7 pg (ref 26.0–34.0)
MCHC: 32.2 g/dL (ref 32.0–36.0)
MCV: 92 fL (ref 80–100)
Monocyte #: 0.9 x10 3/mm (ref 0.2–1.0)
Neutrophil #: 8.8 10*3/uL — ABNORMAL HIGH (ref 1.4–6.5)
Neutrophil %: 86.3 %
Platelet: 111 10*3/uL — ABNORMAL LOW (ref 150–440)
WBC: 10.3 10*3/uL (ref 3.8–10.6)

## 2013-08-31 LAB — BASIC METABOLIC PANEL
Anion Gap: 6 — ABNORMAL LOW (ref 7–16)
BUN: 70 mg/dL — ABNORMAL HIGH (ref 7–18)
Chloride: 107 mmol/L (ref 98–107)
Co2: 27 mmol/L (ref 21–32)
EGFR (African American): 24 — ABNORMAL LOW
Glucose: 79 mg/dL (ref 65–99)
Sodium: 140 mmol/L (ref 136–145)

## 2013-08-31 LAB — MAGNESIUM: Magnesium: 1.3 mg/dL — ABNORMAL LOW

## 2013-08-31 LAB — CREATININE, SERUM
Creatinine, Ser: 2.84 mg/dL — ABNORMAL HIGH (ref 0.50–1.35)
GFR calc Af Amer: 25 mL/min — ABNORMAL LOW (ref >90)

## 2013-08-31 LAB — LIPID PANEL
Cholesterol: 97 mg/dL (ref 0–200)
Ldl Cholesterol, Calc: 45 mg/dL (ref 0–100)

## 2013-08-31 LAB — TROPONIN I: Troponin-I: 0.21 ng/mL — ABNORMAL HIGH

## 2013-08-31 LAB — GLUCOSE, CAPILLARY: Glucose-Capillary: 163 mg/dL — ABNORMAL HIGH (ref 70–99)

## 2013-08-31 LAB — CK-MB: CK-MB: 3.9 ng/mL — ABNORMAL HIGH (ref 0.5–3.6)

## 2013-08-31 LAB — HEMOGLOBIN A1C: Hemoglobin A1C: 5.9 % (ref 4.2–6.3)

## 2013-08-31 MED ORDER — NAPHAZOLINE HCL 0.1 % OP SOLN
1.0000 [drp] | Freq: Four times a day (QID) | OPHTHALMIC | Status: DC | PRN
  Filled 2013-08-31: qty 15

## 2013-08-31 MED ORDER — NAPHAZOLINE-PHENIRAMINE 0.025-0.3 % OP SOLN
1.0000 [drp] | Freq: Four times a day (QID) | OPHTHALMIC | Status: DC | PRN
  Administered 2013-08-31 – 2013-09-02 (×4): 1 [drp] via OPHTHALMIC
  Filled 2013-08-31: qty 15

## 2013-08-31 MED ORDER — ALPRAZOLAM 0.25 MG PO TABS
0.2500 mg | ORAL_TABLET | Freq: Every evening | ORAL | Status: DC | PRN
  Administered 2013-08-31 – 2013-09-01 (×2): 0.25 mg via ORAL
  Filled 2013-08-31 (×2): qty 1

## 2013-08-31 MED ORDER — SODIUM CHLORIDE 0.9 % IV SOLN: 250.0000 mL | INTRAVENOUS | Status: DC | PRN

## 2013-08-31 MED ORDER — INSULIN ASPART 100 UNIT/ML ~~LOC~~ SOLN: 0.0000 [IU] | Freq: Every day | SUBCUTANEOUS | Status: DC

## 2013-08-31 MED ORDER — MYCOPHENOLATE MOFETIL 250 MG PO CAPS
500.0000 mg | ORAL_CAPSULE | Freq: Two times a day (BID) | ORAL | Status: DC
  Administered 2013-08-31 – 2013-09-02 (×5): 500 mg via ORAL
  Filled 2013-08-31 (×6): qty 2

## 2013-08-31 MED ORDER — HEPARIN SODIUM (PORCINE) 5000 UNIT/ML IJ SOLN
5000.0000 [IU] | Freq: Three times a day (TID) | INTRAMUSCULAR | Status: DC
  Administered 2013-08-31 – 2013-09-02 (×7): 5000 [IU] via SUBCUTANEOUS
  Filled 2013-08-31 (×9): qty 1

## 2013-08-31 MED ORDER — PREDNISONE 5 MG PO TABS
5.0000 mg | ORAL_TABLET | Freq: Two times a day (BID) | ORAL | Status: DC
  Administered 2013-08-31 – 2013-09-02 (×5): 5 mg via ORAL
  Filled 2013-08-31 (×7): qty 1

## 2013-08-31 MED ORDER — MYCOPHENOLATE MOFETIL 500 MG PO TABS: 500.0000 mg | ORAL_TABLET | Freq: Two times a day (BID) | ORAL | Status: DC

## 2013-08-31 MED ORDER — INSULIN DETEMIR 100 UNIT/ML ~~LOC~~ SOLN
40.0000 [IU] | Freq: Every day | SUBCUTANEOUS | Status: DC
  Administered 2013-08-31 – 2013-09-01 (×2): 40 [IU] via SUBCUTANEOUS
  Filled 2013-08-31 (×3): qty 0.4

## 2013-08-31 MED ORDER — SODIUM CHLORIDE 0.9 % IJ SOLN
3.0000 mL | Freq: Two times a day (BID) | INTRAMUSCULAR | Status: DC
  Administered 2013-08-31 (×2): 3 mL via INTRAVENOUS

## 2013-08-31 MED ORDER — INSULIN ASPART 100 UNIT/ML ~~LOC~~ SOLN
6.0000 [IU] | Freq: Three times a day (TID) | SUBCUTANEOUS | Status: DC
  Administered 2013-08-31: 6 [IU] via SUBCUTANEOUS

## 2013-08-31 MED ORDER — CYANOCOBALAMIN 250 MCG PO TABS
250.0000 ug | ORAL_TABLET | Freq: Every day | ORAL | Status: DC
  Administered 2013-08-31 – 2013-09-02 (×3): 250 ug via ORAL
  Filled 2013-08-31 (×3): qty 1

## 2013-08-31 MED ORDER — FAMOTIDINE 20 MG PO TABS
20.0000 mg | ORAL_TABLET | Freq: Every day | ORAL | Status: DC
  Administered 2013-08-31 – 2013-09-02 (×3): 20 mg via ORAL
  Filled 2013-08-31 (×3): qty 1

## 2013-08-31 MED ORDER — VITAMIN B-6 100 MG PO TABS
200.0000 mg | ORAL_TABLET | Freq: Every day | ORAL | Status: DC
  Administered 2013-08-31 – 2013-09-02 (×3): 200 mg via ORAL
  Filled 2013-08-31 (×3): qty 2

## 2013-08-31 MED ORDER — PIPERACILLIN-TAZOBACTAM 3.375 G IVPB
3.3750 g | Freq: Three times a day (TID) | INTRAVENOUS | Status: DC
  Administered 2013-08-31 – 2013-09-02 (×7): 3.375 g via INTRAVENOUS
  Filled 2013-08-31 (×10): qty 50

## 2013-08-31 MED ORDER — VITAMIN C 500 MG PO TABS
500.0000 mg | ORAL_TABLET | Freq: Every day | ORAL | Status: DC
  Administered 2013-08-31 – 2013-09-02 (×3): 500 mg via ORAL
  Filled 2013-08-31 (×3): qty 1

## 2013-08-31 MED ORDER — VANCOMYCIN HCL 500 MG IV SOLR
500.0000 mg | INTRAVENOUS | Status: DC
  Administered 2013-08-31 – 2013-09-01 (×2): 500 mg via INTRAVENOUS
  Filled 2013-08-31 (×3): qty 500

## 2013-08-31 MED ORDER — CARVEDILOL 12.5 MG PO TABS
12.5000 mg | ORAL_TABLET | Freq: Two times a day (BID) | ORAL | Status: DC
  Administered 2013-08-31 – 2013-09-02 (×4): 12.5 mg via ORAL
  Filled 2013-08-31 (×6): qty 1

## 2013-08-31 MED ORDER — ONDANSETRON HCL 4 MG/2ML IJ SOLN: 4.0000 mg | Freq: Four times a day (QID) | INTRAMUSCULAR | Status: DC | PRN

## 2013-08-31 MED ORDER — SODIUM CHLORIDE 0.9 % IJ SOLN: 3.0000 mL | INTRAMUSCULAR | Status: DC | PRN

## 2013-08-31 MED ORDER — ONDANSETRON HCL 4 MG PO TABS: 4.0000 mg | ORAL_TABLET | Freq: Four times a day (QID) | ORAL | Status: DC | PRN

## 2013-08-31 MED ORDER — ATORVASTATIN CALCIUM 40 MG PO TABS
40.0000 mg | ORAL_TABLET | Freq: Every day | ORAL | Status: DC
  Administered 2013-08-31 – 2013-09-01 (×2): 40 mg via ORAL
  Filled 2013-08-31 (×3): qty 1

## 2013-08-31 MED ORDER — VANCOMYCIN HCL IN DEXTROSE 750-5 MG/150ML-% IV SOLN
750.0000 mg | INTRAVENOUS | Status: DC
  Filled 2013-08-31: qty 150

## 2013-08-31 MED ORDER — INSULIN ASPART 100 UNIT/ML ~~LOC~~ SOLN
0.0000 [IU] | Freq: Three times a day (TID) | SUBCUTANEOUS | Status: DC
  Administered 2013-08-31: 2 [IU] via SUBCUTANEOUS

## 2013-08-31 MED ORDER — OXYCODONE-ACETAMINOPHEN 5-325 MG PO TABS
1.0000 | ORAL_TABLET | Freq: Four times a day (QID) | ORAL | Status: DC | PRN
  Administered 2013-08-31 – 2013-09-02 (×7): 1 via ORAL
  Filled 2013-08-31 (×7): qty 1

## 2013-08-31 MED ORDER — ASPIRIN EC 325 MG PO TBEC
325.0000 mg | DELAYED_RELEASE_TABLET | Freq: Every day | ORAL | Status: DC
  Administered 2013-08-31 – 2013-09-02 (×3): 325 mg via ORAL
  Filled 2013-08-31 (×3): qty 1

## 2013-08-31 MED ORDER — METHOCARBAMOL 500 MG PO TABS
500.0000 mg | ORAL_TABLET | Freq: Four times a day (QID) | ORAL | Status: DC | PRN
  Administered 2013-09-01: 500 mg via ORAL
  Filled 2013-08-31 (×3): qty 1

## 2013-08-31 NOTE — Progress Notes (Addendum)
ANTIBIOTIC CONSULT NOTE - INITIAL  Pharmacy Consult for vancomycin and zosyn Indication: cellutlits  No Known Allergies  Patient Measurements:   Adjusted Body Weight:   Vital Signs:   Intake/Output from previous day:   Intake/Output from this shift: Total I/O In: -  Out: 200 [Urine:200]  Labs: No results found for this basename: WBC, HGB, PLT, LABCREA, CREATININE,  in the last 72 hours The CrCl is unknown because both a height and weight (above a minimum accepted value) are required for this calculation. No results found for this basename: VANCOTROUGH, VANCOPEAK, VANCORANDOM, GENTTROUGH, GENTPEAK, GENTRANDOM, TOBRATROUGH, TOBRAPEAK, TOBRARND, AMIKACINPEAK, AMIKACINTROU, AMIKACIN,  in the last 72 hours   Microbiology: No results found for this or any previous visit (from the past 720 hour(s)).  Medical History: Past Medical History  Diagnosis Date  . Myocardial infarction 04/12/1990  . CHF (congestive heart failure) 01/2012  . Hypertension   . Diabetes mellitus   . ICD (implantable cardiac defibrillator) in place   . Pacemaker 02/15/2012    [employer]  . Pneumonia 2011  . Arthritis   . History of blood transfusion   . Chronic kidney disease     Glomerulonephritis  . Dysrhythmia   . Cancer     Bladder Cancer- 2004 .  SkinCancer- Basal, Squamous, and a few melymona  . Right foot drop     Medications:  Scheduled:  . aspirin EC  325 mg Oral Daily  . atorvastatin  40 mg Oral q1800  . carvedilol  12.5 mg Oral BID WC  . famotidine  20 mg Oral Daily  . heparin  5,000 Units Subcutaneous Q8H  . insulin aspart  0-5 Units Subcutaneous QHS  . insulin aspart  0-9 Units Subcutaneous TID WC  . insulin aspart  6 Units Subcutaneous TID AC  . insulin detemir  10 Units Subcutaneous Daily  . mycophenolate  500 mg Oral BID  . predniSONE  5 mg Oral BID  . pyridOXINE  200 mg Oral Daily  . sodium chloride  3 mL Intravenous Q12H  . vitamin B-12  250 mcg Oral Daily  . vitamin  C  500 mg Oral Daily   Infusions:   Assessment: 67 yo male with cellulitis will be started on vancomycin and zosyn.  Historical SCr ~2.24 (CrCl ~33) and SCr now is 2.84 (CrCl ~26)  Goal of Therapy:  Vancomycin trough level 10-15 mcg/ml  Plan:  1) Start vancomycin 500 mg iv q24h and zosyn 3.375g iv q8h (4h infusion) for now.   2) F/u with SCr to make sure the dosing is okay 3) Follow up antibiotic plan before checking trough. Monitor renal function:   [name], [name] 08/31/2013,1:00 PM

## 2013-08-31 NOTE — Consult Note (Signed)
Requesting Physician:  Dr. Benjamine Mola Reason for Consult:  Follow for renal transplant with CKD and related issues HPI: The patient is a 67 y.o. year-old WM from [place], [place] who is transferred from [facility] to [facility] for management of a left arm cellulitis. 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  His creatinine in early 2013 was around 1.7, 1.9 about 3 months ago, and 2.7 when he saw Dr. Shayne Alken in September of this year.  He was informed that there was no specific therapy for this.  He was referred to Dr. Arrie Aran who was to have seen him today to establish care locally, as patient now lives in [place].  However, he was admitted to the hospital instead after development of progressive redness and swelling of his left arm over a 48 hour period of time, with duplex done at [facility] negative for clot.  He had undergone some dermatologic instrumentation on that arm/hand sometime earlier for recurrent skin cancers. Creatinine there was noted to be 3.08 and he was transferred from [facility] to be "closer to his nephrology group".  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  Creatinine trending is as follows:  Creatinine, Ser  Date/Time Value Range Status  08/31/2013  2:34 PM 2.84* 0.50 - 1.35 mg/dL Final  2/95/6213  0:86 AM 2.24* 0.50 - 1.35 mg/dL Final   5/78/4696  2:95 AM 2.07* 0.50 - 1.35 mg/dL Final  2/84/1324  4:01 AM 2.22* 0.50 - 1.35 mg/dL Final  0/27/2536  6:44 AM 2.16* 0.50 - 1.35 mg/dL Final  0/34/7425  9:56 AM 2.11* 0.50 - 1.35 mg/dL Final  3/87/5643  3:29 AM 2.09* 0.50 - 1.35 mg/dL Final  04/02/8415  6:06 AM 2.44* 0.50 - 1.35 mg/dL Final  01/13/6009  9:32 AM 2.45* 0.50 - 1.35 mg/dL Final  01/18/5731  2:02 AM 2.54* 0.50 - 1.35 mg/dL Final  03/18/2705  2:37 AM 2.68* 0.50 - 1.35 mg/dL Final  04/16/8314  1:76 AM 2.58* 0.50 - 1.35 mg/dL Final  11/21/735  1:06 AM 2.74* 0.50 - 1.35 mg/dL Final  12/22/9483  4:62 AM 3.05* 0.50 - 1.35 mg/dL Final  7/0/3500  9:38 AM 3.18* 0.50 - 1.35 mg/dL Final  11/23/2991  7:16 AM 3.07* 0.50 - 1.35 mg/dL Final  9/67/8938  1:01 AM 3.00* 0.50 - 1.35 mg/dL Final  7/51/0258  5:27 AM 2.93* 0.50 - 1.35 mg/dL Final  7/82/4235  3:61 AM 2.77* 0.50 - 1.35 mg/dL Final  4/43/1540  0:86 AM 2.38* 0.50 - 1.35 mg/dL Final  7/61/9509  3:26 AM 2.45* 0.50 - 1.35 mg/dL Final  05/27/4579  9:98 PM 2.44* 0.50 - 1.35 mg/dL Final  01/15/8249  5:39 PM 2.21* 0.50 - 1.35 mg/dL Final  7/67/3419  3:79 AM 1.68* 0.50 - 1.35 mg/dL Final  0/12/4095  3:53 PM 1.83* 0.50 - 1.35 mg/dL Final  12/24/9240  6:83 AM 1.37   Final  02/12/2008  9:30 AM 1.37   Final    Past Medical History:  Past Medical History  Diagnosis Date  . Myocardial infarction 04/12/1990  . CHF (congestive heart failure) 01/2012  . Hypertension   . Diabetes mellitus   . ICD (implantable cardiac defibrillator) in place   . Pacemaker 02/15/2012    [employer]  . Pneumonia 2011  . Arthritis   . History of blood transfusion   . Chronic kidney disease     Glomerulonephritis  . Dysrhythmia   . Cancer     Bladder Cancer- 2004 .  SkinCancer- Basal, Squamous, and a few melymona  . Right foot drop   . Cellulitis     Past Surgical History:  Past Surgical History  Procedure Laterality Date  . Kidney transplant  02/13/1973  . Coronary angioplasty  03/1990  . Skin cancer excision       Numerous  . Eye surgery      Cataract Right Eye  . Insert / replace / remove pacemaker    . Uretheral transplation      Left to Right (transplanted kidney)  . Skin transplant      Left thigh to Left hand  . Knee arthroscopy  07/25/2012    Procedure: ARTHROSCOPY KNEE;  Surgeon: Cammy Copa, MD;  Location: [facility] OR;  Service: Orthopedics;  Laterality: Left;  Left knee arthroscopy, debridement, cultures  . Lumbar laminectomy/decompression microdiscectomy Bilateral 03/26/2013    Procedure: LUMBAR LAMINECTOMY/DECOMPRESSION MICRODISCECTOMY 1 LEVEL;  Surgeon: Reinaldo Meeker, MD;  Location: [facility] ORS;  Service: Neurosurgery;  Laterality: Bilateral;  . Lumbar laminectomy/decompression microdiscectomy N/A 04/19/2013    Procedure: Redo Lumbar Three to Four Decompression One LEVEL;  Surgeon: Cristi Loron, MD;  Location: [facility] ORS;  Service: Neurosurgery;  Laterality: N/A;  Redo Lumbar Three to Four Decompression One LEVEL    Family History: History reviewed. No pertinent family history.Father died at age 18 bone cancer; mother died of stroke, has brother with heart issues.  No family history of renal disease.  Social History:  reports that he quit smoking about 30 years ago. He has never used smokeless tobacco. He reports that he does not drink alcohol or use illicit drugs.Lives with his second wife in [place].   Has 2 grown children. Retired from [employer] after 48 years with them  Allergies: No Known Allergies  Home medications: Prior to Admission medications   Medication Sig Start Date End Date Taking? Authorizing Provider  ALPRAZolam Prudy Feeler) 0.25 MG tablet Take 0.25 mg by mouth at bedtime as needed for sleep or anxiety.    Yes Historical Provider, MD  aspirin EC 81 MG tablet Take 81 mg by mouth daily.   Yes Historical Provider, MD  Atorvastatin Calcium (LIPITOR PO) Take by mouth.   Yes Historical Provider, MD  carvedilol (COREG) 12.5 MG tablet Take 12.5 mg by mouth 2  (two) times daily with a meal.   Yes Historical Provider, MD  fish oil-omega-3 fatty acids 1000 MG capsule Take 1 g by mouth daily.   Yes Historical Provider, MD  furosemide (LASIX) 40 MG tablet Take 2 tablets (80 mg total) by mouth daily as needed (if weight is above 173 lbs). 05/04/13  Yes Evlyn Kanner Love, PA-C  gabapentin (NEURONTIN) 300 MG capsule Take 300 mg by mouth 3 (three) times daily. Take 300 mg in the morning, and 600 mg in the evening.   Yes Historical Provider, MD  insulin aspart (NOVOLOG) 100 UNIT/ML injection Inject 6  Units into the skin 3 (three) times daily before meals.   Yes Historical Provider, MD  Insulin Detemir (LEVEMIR FLEXPEN) 100 UNIT/ML SOPN Inject 40 Units into the skin daily.   Yes Historical Provider, MD  Multiple Vitamin (MULTIVITAMIN WITH MINERALS) TABS Take 1 tablet by mouth daily.   Yes Historical Provider, MD  mycophenolate (CELLCEPT) 500 MG tablet Take 500 mg by mouth 2 (two) times daily.    Yes Historical Provider, MD  nitroGLYCERIN (NITROSTAT) 0.4 MG SL tablet Place 0.4 mg under the tongue every 5 (five) minutes as needed for chest pain. x3 doses as needed for chest pain   Yes Historical Provider, MD  oxyCODONE-acetaminophen (PERCOCET) 10-325 MG per tablet Take 1 tablet by mouth every 4 (four) hours as needed for pain.   Yes Historical Provider, MD  predniSONE (DELTASONE) 5 MG tablet Take 5 mg by mouth 2 (two) times daily.   Yes Historical Provider, MD  ranitidine (ZANTAC) 300 MG tablet Take 300 mg by mouth at bedtime.    Yes Historical Provider, MD  tobramycin (TOBREX) 0.3 % ophthalmic solution Place 1 drop into both eyes as needed (for allergies).   Yes Historical Provider, MD  vitamin C (ASCORBIC ACID) 500 MG tablet Take 500 mg by mouth daily.   Yes Historical Provider, MD    Inpatient medications: . aspirin EC  325 mg Oral Daily  . atorvastatin  40 mg Oral q1800  . carvedilol  12.5 mg Oral BID WC  . famotidine  20 mg Oral Daily  . heparin  5,000 Units  Subcutaneous Q8H  . insulin aspart  0-5 Units Subcutaneous QHS  . insulin aspart  0-9 Units Subcutaneous TID WC  . insulin aspart  6 Units Subcutaneous TID AC  . insulin detemir  40 Units Subcutaneous Daily  . mycophenolate  500 mg Oral BID  . piperacillin-tazobactam (ZOSYN)  IV  3.375 g Intravenous Q8H  . predniSONE  5 mg Oral BID  . pyridOXINE  200 mg Oral Daily  . sodium chloride  3 mL Intravenous Q12H  . vancomycin  500 mg Intravenous Q24H  . vitamin B-12  250 mcg Oral Daily  . vitamin C  500 mg Oral Daily    Review of Systems Gen:  Denies headache, fever, chills, sweats.  No weight loss. HEENT:  No visual change, sore throat, difficulty swallowing. Resp:  No difficulty breathing, DOE.  No cough or hemoptysis. Cardiac:  No chest pain, orthopnea, PND.  Denies edema. GI:   Denies abdominal pain.   No nausea, vomiting, diarrhea.  No constipation. GU:  Denies difficulty or change in voiding.  No change in urine color.     MS:  Denies joint pain or swelling.   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  Neuro:   Weakness and pain in back and left leg requiring ambulation with a walker (since disc surgery in 03/2013); sleepiness and confusion recently - felt d/t gabapentin which pt stopped on his own Psych:  Denies symptoms of depression of anxiety.  No hallucination.    Physical Exam:  Blood pressure 135/75, pulse 72, temperature 98.6 F (37 C), temperature source Oral, resp. rate 18, SpO2 96.00%.  Gen: Chronically ill appearing, pale WM  NAD Skin: Covered in scaly lesions, hyperkeratotic lesions, scars from prior skin cancers - most prevalen on arms and trunk Neck: no JVD Chest: grossly clear to auscultation Heart: regular, no rub or gallop Left sided ICD in place Abdomen:  soft, Renal allograft in right lower quadrant firm and without tenderness Ext: No signifiant LE edema. Left arm edematous and erythematous elbow to hand, with several  open lesions; warm to touch Neuro: alert, Ox3, no focal deficit   Recent Labs Lab 08/31/13 1434  CREATININE 2.84*   Recent Labs Lab 08/31/13 1127  GLUCAP 111*    Impression/Plan 1. Left upper extremity cellulitus - on vanco and zosyn. Will need to watch vanco dosing carefully with reduced GFR 2. 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  3. Ischemic cardiomyopathy; h/o VF arrest - no cardiopulmonary symptoms at present 4. Anemia - no data.  Check CBC 5. DM2 - per primary 6. 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 Check calcium, phos and PTH  Camille Bal,  MD [facility] [phone] pager 08/31/2013, 4:44 PM

## 2013-08-31 NOTE — Progress Notes (Signed)
Patient transferred to [name] 6E room 30 from [facility] hospital.  Oriented to unit and room.  Obtained vital signs and CBG and assessed patient.  Educated patient on use of call bell, phone, bed controls.  Patient demonstrated understanding of orientation.  Educated patient on fall precautions, yellow arm band placed, bed alarm on, red socks placed on patient.  Patient verbalized understanding of fall precautions.  Patient comfortable.

## 2013-08-31 NOTE — H&P (Signed)
[facility] History and Physical  [name]:[id number] DOB: 29-Apr-1946 DOA: 08/31/2013  Referring physician: er PCP: [name], MD  Specialists:   Chief Complaint: cellulitis- requested [facility] (says he has seen Dr. Salena Saner in the past)  HPI: [name] is a 67 y.o. male  Who was transferred to [facility] from [place] requesting to be seen by his kidney doctor for a transplant (he has not yet established with then- has an appointment with Dr. Salena Saner).   He presented at [facility] with a left arm cellulitis after 2 biopsies done by his dermatologist- path returned for [facility]- he is scheduled for excision in November.  2 days ago he began to have swelling,  Pain in his left arm at the biopsy site.  He also had low grade fevers.  In the Er, he was found to have an elevated WBC count and given clindamycin- an ultrasound was also done that was negative for DVT.  He was then placed on zosyn and zyvox and WBC count has decreased to 10 overnight.  Patient's concern and reason for transfer was his Cr was found to be 3.08 which was elevated from his baseline of 2.4 (per patient).     Review of Systems: all systems reviewed, negative unless stated above   Past Medical History  Diagnosis Date  . Myocardial infarction 04/12/1990  . CHF (congestive heart failure) 01/2012  . Hypertension   . Diabetes mellitus   . ICD (implantable cardiac defibrillator) in place   . Pacemaker 02/15/2012    [employer]  . Pneumonia 2011  . Arthritis   . History of blood transfusion   . Chronic kidney disease     Glomerulonephritis  . Dysrhythmia   . Cancer     Bladder Cancer- 2004 .  SkinCancer- Basal, Squamous, and a few melymona  . Right foot drop    Past Surgical History  Procedure Laterality Date  . Kidney transplant  02/13/1973  . Coronary angioplasty  03/1990  . Skin cancer excision      Numerous  . Eye surgery      Cataract Right Eye  . Insert / replace / remove  pacemaker    . Uretheral transplation      Left to Right (transplanted kidney)  . Skin transplant      Left thigh to Left hand  . Knee arthroscopy  07/25/2012    Procedure: ARTHROSCOPY KNEE;  Surgeon: Cammy Copa, MD;  Location: [facility] OR;  Service: Orthopedics;  Laterality: Left;  Left knee arthroscopy, debridement, cultures  . Lumbar laminectomy/decompression microdiscectomy Bilateral 03/26/2013    Procedure: LUMBAR LAMINECTOMY/DECOMPRESSION MICRODISCECTOMY 1 LEVEL;  Surgeon: Reinaldo Meeker, MD;  Location: [facility];  Service: Neurosurgery;  Laterality: Bilateral;  . Lumbar laminectomy/decompression microdiscectomy N/A 04/19/2013    Procedure: Redo Lumbar Three to Four Decompression One LEVEL;  Surgeon: Cristi Loron, MD;  Location: [facility];  Service: Neurosurgery;  Laterality: N/A;  Redo Lumbar Three to Four Decompression One LEVEL   Social History:  reports that he quit smoking about 30 years ago. He does not have any smokeless tobacco history on file. He reports that he does not drink alcohol or use illicit drugs.   No Known Allergies  Family hx: DM   Prior to Admission medications   Medication Sig Start Date End Date Taking? Authorizing Provider  ALPRAZolam Prudy Feeler) 0.25 MG tablet Take 0.25 mg by mouth at bedtime as needed for  sleep or anxiety.     Historical Provider, MD  aspirin EC 325 MG EC tablet Take 1 tablet (325 mg total) by mouth daily. 05/04/13   Evlyn Kanner Love, PA-C  carvedilol (COREG) 12.5 MG tablet Take 12.5 mg by mouth 2 (two) times daily with a meal.    Historical Provider, MD  fish oil-omega-3 fatty acids 1000 MG capsule Take 1 g by mouth daily.    Historical Provider, MD  furosemide (LASIX) 40 MG tablet Take 2 tablets (80 mg total) by mouth daily as needed (if weight is above 173 lbs). 05/04/13   Evlyn Kanner Love, PA-C  insulin aspart (NOVOLOG) 100 UNIT/ML injection Inject 6 Units into the skin 3 (three) times daily before meals.    Historical Provider, MD   insulin detemir (LEVEMIR) 100 UNIT/ML injection Inject 0.1 mLs (10 Units total) into the skin daily. 05/04/13   Jacquelynn Cree, PA-C  methocarbamol (ROBAXIN) 500 MG tablet Take 1 tablet (500 mg total) by mouth every 6 (six) hours as needed. For spasms. 05/04/13   Jacquelynn Cree, PA-C  Multiple Vitamin (MULTIVITAMIN WITH MINERALS) TABS Take 1 tablet by mouth daily.    Historical Provider, MD  mycophenolate (CELLCEPT) 500 MG tablet Take 500 mg by mouth 2 (two) times daily.     Historical Provider, MD  nitroGLYCERIN (NITROSTAT) 0.4 MG SL tablet Place 0.4 mg under the tongue every 5 (five) minutes as needed for chest pain. x3 doses as needed for chest pain    Historical Provider, MD  oxyCODONE-acetaminophen (PERCOCET/ROXICET) 5-325 MG per tablet Take 1 tablet by mouth every 6 (six) hours as needed for pain (for severe pain). For pain 05/04/13   Evlyn Kanner Love, PA-C  predniSONE (DELTASONE) 5 MG tablet Take 5 mg by mouth 2 (two) times daily.    Historical Provider, MD  pyridOXINE (VITAMIN B-6) 100 MG tablet Take 200 mg by mouth daily.    Historical Provider, MD  ranitidine (ZANTAC) 300 MG tablet Take 300 mg by mouth at bedtime.     Historical Provider, MD  senna-docusate (SENOKOT-S) 8.6-50 MG per tablet Take 2 tablets by mouth at bedtime. Over the counter medication for constipation. 05/04/13   Jacquelynn Cree, PA-C  simvastatin (ZOCOR) 80 MG tablet Take 80 mg by mouth at bedtime.    Historical Provider, MD  tobramycin (TOBREX) 0.3 % ophthalmic solution Place 1 drop into both eyes as needed (for allergies).    Historical Provider, MD  vitamin B-12 (CYANOCOBALAMIN) 250 MCG tablet Take 250 mcg by mouth daily.    Historical Provider, MD  vitamin C (ASCORBIC ACID) 500 MG tablet Take 500 mg by mouth daily.    Historical Provider, MD   Physical Exam: There were no vitals filed for this visit.   General:  A+Ox3, NAD  Eyes: wnl  ENT: wnl  Neck: supple  Cardiovascular: rrr  Respiratory: clear, no  wheezing  Abdomen: +BS, soft, NT- obese  Skin: covered in lesions  Musculoskeletal: moves all 4 ext- weak in b/L LE  Psychiatric: normal mood/affect  Neurologic: CN 2-12 intact  Labs on Admission:  Basic Metabolic Panel: No results found for this basename: NA, K, CL, CO2, GLUCOSE, BUN, CREATININE, CALCIUM, MG, PHOS,  in the last 168 hours Liver Function Tests: No results found for this basename: AST, ALT, ALKPHOS, BILITOT, PROT, ALBUMIN,  in the last 168 hours No results found for this basename: LIPASE, AMYLASE,  in the last 168 hours No results found for this basename: AMMONIA,  in the last 168 hours CBC: No results found for this basename: WBC, NEUTROABS, HGB, HCT, MCV, PLT,  in the last 168 hours Cardiac Enzymes: No results found for this basename: CKTOTAL, CKMB, CKMBINDEX, TROPONINI,  in the last 168 hours  BNP (last 3 results)  Recent Labs  04/09/13 1856  PROBNP 21940.0*   CBG:  Recent Labs Lab 08/31/13 1127  GLUCAP 111*    Radiological Exams on Admission: No results found.    Assessment/Plan Principal Problem:   Cellulitis Active Problems:   History of renal transplant   Diabetes mellitus type 2, controlled   Hx of skin cancer, basal cell   1. Cellulitis: IV abx, monitor renal function closely- hope to have response and to be able to taper quickly; elevate extremity seems to be responding to abx so will not do further imaging, no DVT 2. Renal transplant with Cr slightly above baseline- continue anti-rejection meds, renal consult 3. DM- SSI, continue home lantus and novolog, monitor 4. Basal cell skin cancer- has appointment as outpatient for excision   nephrology  Code Status: full Family Communication: patient Disposition Plan: admit  Time spent: 75 min  Cherrill Scrima [facility] Pager [phone]  If 7PM-7AM, please contact night-coverage [email or web address] Password [email or web address] 08/31/2013, 12:50 PM

## 2013-09-01 DIAGNOSIS — E11649 Type 2 diabetes mellitus with hypoglycemia without coma: Secondary | ICD-10-CM | POA: Diagnosis not present

## 2013-09-01 DIAGNOSIS — N189 Chronic kidney disease, unspecified: Secondary | ICD-10-CM

## 2013-09-01 DIAGNOSIS — D631 Anemia in chronic kidney disease: Secondary | ICD-10-CM

## 2013-09-01 DIAGNOSIS — E1169 Type 2 diabetes mellitus with other specified complication: Secondary | ICD-10-CM

## 2013-09-01 DIAGNOSIS — D696 Thrombocytopenia, unspecified: Secondary | ICD-10-CM | POA: Diagnosis present

## 2013-09-01 LAB — IRON AND TIBC
Iron: 22 ug/dL — ABNORMAL LOW (ref 42–135)
Saturation Ratios: 11 % — ABNORMAL LOW (ref 20–55)
TIBC: 196 ug/dL — ABNORMAL LOW (ref 215–435)
UIBC: 174 ug/dL (ref 125–400)

## 2013-09-01 LAB — CBC
HCT: 23.5 % — ABNORMAL LOW (ref 39.0–52.0)
Hemoglobin: 7.4 g/dL — ABNORMAL LOW (ref 13.0–17.0)
MCHC: 31.5 g/dL (ref 30.0–36.0)
MCV: 93.6 fL (ref 78.0–100.0)
Platelets: 117 10*3/uL — ABNORMAL LOW (ref 150–400)
RDW: 15.9 % — ABNORMAL HIGH (ref 11.5–15.5)

## 2013-09-01 LAB — RENAL FUNCTION PANEL
Albumin: 2.3 g/dL — ABNORMAL LOW (ref 3.5–5.2)
BUN: 69 mg/dL — ABNORMAL HIGH (ref 6–23)
Calcium: 8.1 mg/dL — ABNORMAL LOW (ref 8.4–10.5)
Creatinine, Ser: 2.98 mg/dL — ABNORMAL HIGH (ref 0.50–1.35)
GFR calc Af Amer: 24 mL/min — ABNORMAL LOW (ref >90)
GFR calc non Af Amer: 21 mL/min — ABNORMAL LOW (ref >90)
Glucose, Bld: 44 mg/dL — CL (ref 70–99)
Phosphorus: 4.5 mg/dL (ref 2.3–4.6)
Potassium: 4.3 mEq/L (ref 3.5–5.1)

## 2013-09-01 LAB — FERRITIN: Ferritin: 869 ng/mL — ABNORMAL HIGH (ref 22–322)

## 2013-09-01 LAB — GLUCOSE, CAPILLARY
Glucose-Capillary: 44 mg/dL — CL (ref 70–99)
Glucose-Capillary: 70 mg/dL (ref 70–99)
Glucose-Capillary: 104 mg/dL — ABNORMAL HIGH (ref 70–99)
Glucose-Capillary: 53 mg/dL — ABNORMAL LOW (ref 70–99)

## 2013-09-01 MED ORDER — INSULIN DETEMIR 100 UNIT/ML ~~LOC~~ SOLN
35.0000 [IU] | Freq: Every day | SUBCUTANEOUS | Status: DC
  Filled 2013-09-01: qty 0.35

## 2013-09-01 MED ORDER — INSULIN ASPART 100 UNIT/ML ~~LOC~~ SOLN: 4.0000 [IU] | Freq: Three times a day (TID) | SUBCUTANEOUS | Status: DC

## 2013-09-01 MED ORDER — DARBEPOETIN ALFA-POLYSORBATE 100 MCG/0.5ML IJ SOLN
100.0000 ug | INTRAMUSCULAR | Status: DC
  Administered 2013-09-01: 100 ug via SUBCUTANEOUS
  Filled 2013-09-01: qty 0.5

## 2013-09-01 NOTE — Progress Notes (Signed)
Subjective:  No new complaints Questioned him about his anemia - he's unaware of prev Hb and has never been on procrit or aranesp Arm feeling MUCH improved  Objective Vital signs in last 24 hours: Filed Vitals:   08/31/13 1200 08/31/13 2200 09/01/13 0411 09/01/13 1000  BP: 135/75 136/72 146/80 138/73  Pulse: 72 75 71 71  Temp: 98.6 F (37 C) 98.3 F (36.8 C) 98.7 F (37.1 C) 98.2 F (36.8 C)  TempSrc: Oral Oral Oral Oral  Resp: 18 18 19 18   Height:  6\' 2"  (1.88 m)    Weight:  81.149 kg (178 lb 14.4 oz)    SpO2: 96% 94% 95% 96%   Weight change:   Intake/Output Summary (Last 24 hours) at 09/01/13 1200 Last data filed at 09/01/13 0900  Gross per 24 hour  Intake    240 ml  Output    850 ml  Net   -610 ml   Physical Exam:  Blood pressure 138/73, pulse 71, temperature 98.2 F (36.8 C), temperature source Oral, resp. rate 18, height 6\' 2"  (1.88 m), weight 81.149 kg (178 lb 14.4 oz), SpO2 96.00%. Gen: Chronically ill appearing, pale WM NAD  Skin: Covered in scaly lesions, hyperkeratotic lesions, scars from prior skin cancers - most prevalent on arms and trunk  Neck: no JVD  Chest: grossly clear to auscultation  Heart: regular, no rub or gallop Left sided ICD in place  Abdomen: soft, Renal allograft in right lower quadrant firm and without tenderness  Ext: No signifiant LE edema.  Left arm edema much less ("wrinkled" from improvement; erythema also markedly decreased.  Neuro: alert, Ox3  Labs: Basic Metabolic Panel:  Recent Labs Lab 08/31/13 1434 09/01/13 0653  NA  --  138  K  --  4.3  CL  --  102  CO2  --  23  GLUCOSE  --  44*  BUN  --  69*  CREATININE 2.84* 2.98*  CALCIUM  --  8.1*  PHOS  --  4.5    Recent Labs Lab 09/01/13 0653  ALBUMIN 2.3*   Recent Labs Lab 09/01/13 0653  WBC 7.3  HGB 7.4*  HCT 23.5*  MCV 93.6  PLT 117*    Recent Labs Lab 08/31/13 1127 08/31/13 1720 08/31/13 2138 09/01/13 0742  GLUCAP 111* 161* 163* 44*    Studies/Results: No results found. Medications:   . aspirin EC  325 mg Oral Daily  . atorvastatin  40 mg Oral q1800  . carvedilol  12.5 mg Oral BID WC  . famotidine  20 mg Oral Daily  . heparin  5,000 Units Subcutaneous Q8H  . insulin aspart  0-5 Units Subcutaneous QHS  . insulin aspart  0-9 Units Subcutaneous TID WC  . insulin aspart  4 Units Subcutaneous TID AC  . [START ON 09/02/2013] insulin detemir  35 Units Subcutaneous Daily  . mycophenolate  500 mg Oral BID  . piperacillin-tazobactam (ZOSYN)  IV  3.375 g Intravenous Q8H  . predniSONE  5 mg Oral BID  . pyridOXINE  200 mg Oral Daily  . sodium chloride  3 mL Intravenous Q12H  . vancomycin  500 mg Intravenous Q24H  . vitamin B-12  250 mcg Oral Daily  . vitamin C  500 mg Oral Daily    I  have reviewed scheduled and prn medications.  Impression/Plan  1. Left upper extremity cellulitus - on vanco and zosyn. MUCH improvement in just 24 hours 2. 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 Reiterated to him that there is no treatment for the allograft nephropathy; he says he's not sure he would ever go back on dialysis... 3. Ischemic cardiomyopathy; h/o VF arrest - no cardiopulmonary symptoms at present 4. Anemia - check iron studies, B12, folate. Start Aranesp 100/week. 5. DM2 - per primary 6. Chronic back pain, left leg pain - sp/ 2 surgeries; was getting PT; using a walker; this may set him back some 7. CKD-MBD - Ca, Phos OK.  PTH pending    Camille Bal, MD [facility] [phone] pager 09/01/2013, 12:00 PM

## 2013-09-01 NOTE — Progress Notes (Signed)
[facility] PROGRESS NOTE  [name] ZOX:[id number] DOB: 08-25-1946 DOA: 08/31/2013 PCP: [name], MD Primary Cardiologist: Dr. Lady Gary Primary neurosurgeon: Dr. Tressie Stalker.  HPI/Brief narrative 67 y.o. year-old WM from [place], [place] who is transferred from [facility] to [facility] for management of a left arm cellulitis. 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 His creatinine in early 2012/04/04 was around 1.7, 1.9 about 3 months ago, and 2.7 when he saw Dr. Shayne Alken in September of this year. He was informed that there was no specific therapy for this. He was admitted to the hospital after development of progressive redness and swelling of his left arm over a 48 hour period of time, with duplex done at [facility] negative for clot. He had undergone some dermatologic instrumentation on that arm/hand sometime earlier for recurrent skin cancers. Creatinine there was noted to be 3.08 and he was transferred from [facility] to be "closer to his nephrology group". Patient was empirically started on IV vancomycin and Zosyn and his left upper extremity cellulitis is improving. Creatinine stable.  Assessment/Plan:  Left upper extremity cellulitis - Developed post dermatology instrumentation recently-confirmed basal cell cancer. - Improving. 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 - Continue immunosuppressants and management per nephrology  Anemia and  thrombocytopenia - Baseline hemoglobin probably in the 9-10 mg per DL (June 8119). - ? Secondary to immunosuppressants & CKD - Follow CBC in a.m.  - Management per nephrology-Aranesp etc.    Type II DM with hypoglycemia - Reduced dose of Levemir (already received today's dose) and mealtime NovoLog. - Monitor closely  Ischemic cardiomyopathy/history of VF arrest/AICD - Stable.  Hypertension - Reasonable control.  Exfoliative chronic skin disorder/LUE basal cell carcinoma - Outpatient followup with dermatology.  History of chronic back pain, left leg pain - Ambulates at home with a walker and gets outpatient PT - Request PT evaluation.  DVT prophylaxis: Heparin Lines/catheters: PIV Nutrition: Diabetic diet  Activity:  Up with assistance Code Status: Full Family Communication: None Disposition Plan: Home when medically stable: ? 1-2 days.   Consultants:  Nephrology  Procedures:  None  Antibiotics:  IV Zosyn 10/17 >  IV Vancomycin 10/17 >   Subjective: LUE pain, swelling has improved. Able to lift LUE which he wasn't able to when he came in. Also able to flex fingers, which he wasn't able to do.  Objective: Filed Vitals:   08/31/13 1200 08/31/13 2200 09/01/13 0411  BP: 135/75 136/72 146/80  Pulse: 72 75 71  Temp: 98.6 F (37 C) 98.3 F (36.8 C) 98.7 F (37.1 C)  TempSrc: Oral Oral Oral  Resp: 18 18 19   Height:  6\' 2"  (1.88 m)   Weight:  81.149 kg (178 lb 14.4 oz)   SpO2: 96% 94% 95%    Intake/Output Summary (Last 24 hours) at 09/01/13 1005 Last data filed at 09/01/13 0411  Gross per 24 hour  Intake      0 ml  Output    650 ml  Net   -650 ml  Filed Weights   08/31/13 2200  Weight: 81.149 kg (178 lb 14.4 oz)     Exam:  General exam: Pleasant moderately built and nourished male patient sitting up in bed without distress.  Respiratory system: Clear. No increased work of breathing. Cardiovascular system: S1 & S2 heard, RRR. No JVD, murmurs,  gallops, clicks or pedal edema. Gastrointestinal system: Abdomen is nondistended, soft and nontender. Normal bowel sounds heard. Central nervous system: Alert and oriented. No focal neurological deficits. Extremities: Symmetric 5 x 5 power. Left upper extremity with diffuse swelling, mild warmth but no tenderness. Scabbed area of skin biopsy/basal cell carcinoma-not actively bleeding. Able to partially flex fingers.  Skin: Extensive chronic diffuse dryness and scaling of skin    Data Reviewed: Basic Metabolic Panel:  Recent Labs Lab 08/31/13 1434 09/01/13 0653  NA  --  138  K  --  4.3  CL  --  102  CO2  --  23  GLUCOSE  --  44*  BUN  --  69*  CREATININE 2.84* 2.98*  CALCIUM  --  8.1*  PHOS  --  4.5   Liver Function Tests:  Recent Labs Lab 09/01/13 0653  ALBUMIN 2.3*   No results found for this basename: LIPASE, AMYLASE,  in the last 168 hours No results found for this basename: AMMONIA,  in the last 168 hours CBC:  Recent Labs Lab 09/01/13 0653  WBC 7.3  HGB 7.4*  HCT 23.5*  MCV 93.6  PLT 117*   Cardiac Enzymes: No results found for this basename: CKTOTAL, CKMB, CKMBINDEX, TROPONINI,  in the last 168 hours BNP (last 3 results)  Recent Labs  04/09/13 1856  PROBNP 21940.0*   CBG:  Recent Labs Lab 08/31/13 1127 08/31/13 1720 08/31/13 2138 09/01/13 0742  GLUCAP 111* 161* 163* 44*    No results found for this or any previous visit (from the past 240 hour(s)).    Additional labs: 1. None     Studies: No results found.      Scheduled Meds: . aspirin EC  325 mg Oral Daily  . atorvastatin  40 mg Oral q1800  . carvedilol  12.5 mg Oral BID WC  . famotidine  20 mg Oral Daily  . heparin  5,000 Units Subcutaneous Q8H  . insulin aspart  0-5 Units Subcutaneous QHS  . insulin aspart  0-9 Units Subcutaneous TID WC  . insulin aspart  6 Units Subcutaneous TID AC  . insulin detemir  40 Units Subcutaneous Daily  . mycophenolate  500 mg Oral BID  .  piperacillin-tazobactam (ZOSYN)  IV  3.375 g Intravenous Q8H  . predniSONE  5 mg Oral BID  . pyridOXINE  200 mg Oral Daily  . sodium chloride  3 mL Intravenous Q12H  . vancomycin  500 mg Intravenous Q24H  . vitamin B-12  250 mcg Oral Daily  . vitamin C  500 mg Oral Daily   Continuous Infusions:   Principal Problem:   Cellulitis Active Problems:   History of renal transplant   Diabetes mellitus type 2, controlled   Hx of skin cancer, basal cell    Time spent: 45 minutes.    Marcellus Scott, MD, FACP, FHM. [facility] Pager [phone]  If 7PM-7AM, please contact night-coverage [email or web address] Password [email or web address] 09/01/2013, 10:05 AM    LOS: 1 day

## 2013-09-01 NOTE — Progress Notes (Signed)
Patient requesting home eyedrops Tobrex.  Benedetto Coons, NP notified.  Naphcon eyedrops ordered.

## 2013-09-02 LAB — CBC
Hemoglobin: 7.4 g/dL — ABNORMAL LOW (ref 13.0–17.0)
MCHC: 31.5 g/dL (ref 30.0–36.0)
MCV: 93.6 fL (ref 78.0–100.0)
Platelets: 122 10*3/uL — ABNORMAL LOW (ref 150–400)
RBC: 2.51 MIL/uL — ABNORMAL LOW (ref 4.22–5.81)
RDW: 15.8 % — ABNORMAL HIGH (ref 11.5–15.5)

## 2013-09-02 LAB — RENAL FUNCTION PANEL
Albumin: 2.1 g/dL — ABNORMAL LOW (ref 3.5–5.2)
CO2: 22 mEq/L (ref 19–32)
Calcium: 7.9 mg/dL — ABNORMAL LOW (ref 8.4–10.5)
Creatinine, Ser: 3.12 mg/dL — ABNORMAL HIGH (ref 0.50–1.35)
GFR calc Af Amer: 23 mL/min — ABNORMAL LOW (ref >90)
GFR calc non Af Amer: 19 mL/min — ABNORMAL LOW (ref >90)
Phosphorus: 4.8 mg/dL — ABNORMAL HIGH (ref 2.3–4.6)
Potassium: 3.9 mEq/L (ref 3.5–5.1)
Sodium: 136 mEq/L (ref 135–145)

## 2013-09-02 LAB — GLUCOSE, CAPILLARY
Glucose-Capillary: 72 mg/dL (ref 70–99)
Glucose-Capillary: 121 mg/dL — ABNORMAL HIGH (ref 70–99)

## 2013-09-02 MED ORDER — DOXYCYCLINE HYCLATE 100 MG PO CAPS: 100.0000 mg | ORAL_CAPSULE | Freq: Two times a day (BID) | ORAL | Status: DC

## 2013-09-02 MED ORDER — INSULIN DETEMIR 100 UNIT/ML ~~LOC~~ SOLN
25.0000 [IU] | Freq: Every day | SUBCUTANEOUS | Status: DC
  Administered 2013-09-02: 25 [IU] via SUBCUTANEOUS
  Filled 2013-09-02: qty 0.25

## 2013-09-02 MED ORDER — INSULIN ASPART 100 UNIT/ML ~~LOC~~ SOLN: 3.0000 [IU] | Freq: Three times a day (TID) | SUBCUTANEOUS | Status: DC

## 2013-09-02 MED ORDER — FERROUS SULFATE 325 (65 FE) MG PO TBEC: 325.0000 mg | DELAYED_RELEASE_TABLET | Freq: Two times a day (BID) | ORAL | Status: AC

## 2013-09-02 NOTE — Progress Notes (Signed)
Hypoglycemic Event  CBG: 66  Treatment: BREAKFAST, JUICE  Symptoms: NONE  Follow-up CBG: Time: CBG Result:84  Possible Reasons for Event: POOR INTAKE  Comments/MD notified:dR. Regino Bellow, Sharlyne Cai B  Remember to initiate Hypoglycemia Order Set & complete

## 2013-09-02 NOTE — Discharge Summary (Signed)
Physician Discharge Summary  [name] WGN:[id number] DOB: May 26, 1946 DOA: 08/31/2013  PCP: [name], MD  Admit date: 08/31/2013 Discharge date: 09/02/2013  Time spent: Greater than 30 minutes  Recommendations for Outpatient Follow-up:  1. Dr. [name], PCP in 5 days with repeat labs (CBC & Renal Panel) 2. Dr. [name], Nephrology: Call office on Mon 09/03/13 for an appointment. 3. Dermatologist: advised to follow up regarding Mx of recently diagnosed BCC of left forearm skin. 4. Continue outpatient PT at [facility]  Discharge Diagnoses:  Principal Problem:   Cellulitis Active Problems:   History of renal transplant   HTN (hypertension)   Diabetes mellitus type 2, controlled   Hx of skin cancer, basal cell   Anemia in chronic kidney disease   Thrombocytopenia, unspecified   Type 2 diabetes mellitus with hypoglycemia   Discharge Condition: Improved & Stable  Diet recommendation: Renal & Diabetic diet.  Filed Weights   08/31/13 2200 09/01/13 04-09-2233  Weight: 81.149 kg (178 lb 14.4 oz) 82.5 kg (181 lb 14.1 oz)    History of present illness:  67 y.o. year-old WM from [name], [place] who is transferred from [facility] to [facility] for management of a left arm cellulitis. 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 His creatinine in early 04-09-12 was around 1.7, 1.9 about 3 months ago, and 2.7 when he saw Dr. Shayne Alken in September of this year. He was informed that there was no specific therapy for this. He was admitted to the hospital after development of progressive redness and swelling of his left arm over a 48 hour period of time,  with duplex done at [facility] negative for clot. He had undergone some dermatologic instrumentation on that arm/hand sometime earlier for recurrent skin cancers. Creatinine there was noted to be 3.08 and he was transferred from [facility] to be "closer to his nephrology group". Patient was empirically started on IV vancomycin and Zosyn and his left upper extremity cellulitis is improving. Creatinine stable.  Hospital Course:   Left upper extremity cellulitis  - Developed post dermatology instrumentation recently-confirmed basal cell cancer.  - Continues to improve. 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  - Continue immunosuppressants (prednisone & CellCept) and management per nephrology  - Creatinine slightly higher today at 3.17. Discussed with Dr. [name] a significant change and cleared for discharge home with close outpatient followup with nephrology.  Anemia and thrombocytopenia/possible iron deficiency  - Baseline hemoglobin probably in the 9-10 mg per DL (June 7846).  - ? Secondary to immunosuppressants & CKD  - Stable - Management per nephrology-Aranesp etc.  - As discussed with nephrology, start oral iron supplements at discharge.  Type II DM with hypoglycemia  - Patient had couple of episodes of hypoglycemia in the hospital. - He states that he is compliant with Levemir 40 units daily. Meal time NovoLog: He sometimes intentionally Does not take his breakfast dose of  NovoLog when his CBGs are in the 100-110 mg per DL range. He says that he may miss about 3 doses per week. He does not have hypoglycemic episodes but occasionally. - He states that the food  in the hospital is "terrible" and hence he is barely eating anything in the hospital which is causing his current hypoglycemic episodes. He was offered the option of monitoring CBGs closely for additional day while reducing insulins in the hospital. He however declines and states that he should be fine when he gets home and able to eat home food and is well aware of hypoglycemia symptoms and management. - Advised that his insulin doses may have to be adjusted related to his kidney disease, if he has continued hypoglycemic episodes. He verbalizes understanding. - OP follow up with PCP.  Ischemic cardiomyopathy/history of VF arrest/AICD  - Stable.   Hypertension  - Reasonable control.   Exfoliative chronic skin disorder/LUE basal cell carcinoma  - Outpatient followup with dermatology.   History of chronic back pain, left leg pain  - Ambulates at home with a walker and gets outpatient PT  - Request PT evaluation-recommend continued OP evaluation at [facility].    Consultants:  Nephrology  Procedures:  None    Discharge Exam:  Complaints:  Left upper extremity continues to improve and states that it's >50% better with continued improvement of pain, swelling and improved mobility of the hand and fingers.  Filed Vitals:   09/01/13 1807 09/01/13 2234 09/02/13 0600 09/02/13 1000  BP: 142/76 167/82 147/85 157/67  Pulse: 76 72 76 97  Temp: 98 F (36.7 C) 98.8 F (37.1 C) 99 F (37.2 C) 98.2 F (36.8 C)  TempSrc: Oral Oral Oral Oral  Resp:  18 16 18   Height:  6\' 2"  (1.88 m)    Weight:  82.5 kg (181 lb 14.1 oz)    SpO2: 98% 98% 97% 98%    General exam: Pleasant moderately built and nourished male patient sitting up in chair without distress.  Respiratory system: Clear. No increased work of breathing.  Cardiovascular system: S1 & S2 heard, RRR. No JVD, murmurs, gallops, clicks or pedal edema.  Gastrointestinal system: Abdomen is nondistended, soft and nontender. Normal bowel sounds  heard.  Central nervous system: Alert and oriented. No focal neurological deficits.  Extremities: Symmetric 5 x 5 power. Left upper extremity with diffuse swelling, mild warmth but no tenderness-all much better than yesterday. Small exfoliating lesion on left upper forearm-not actively bleeding in no acute findings. Able to flex fingers even better. Skin: Extensive chronic diffuse dryness and scaling of skin. Dressing on left lower shin skin tear site, dry and intact    Discharge Instructions      Discharge Orders   Future Orders Complete By Expires   Call MD for:  difficulty breathing, headache or visual disturbances  As directed    Call MD for:  extreme fatigue  As directed    Call MD for:  persistant dizziness or light-headedness  As directed    Call MD for:  redness, tenderness, or signs of infection (pain, swelling, redness, odor or green/yellow discharge around incision site)  As directed    Call MD for:  severe uncontrolled pain  As directed    Call MD for:  temperature >100.4  As directed    Call MD for:  As directed    Comments:     Low blood sugars.   Discharge instructions  As directed    Comments:  DIET: Renal & Diabetic diet.   Increase activity slowly  As directed        Medication List         ALPRAZolam 0.25 MG tablet  Commonly known as:  XANAX  Take 0.25 mg by mouth at bedtime as needed for sleep or anxiety.     aspirin EC 81 MG tablet  Take 81 mg by mouth daily.     carvedilol 12.5 MG tablet  Commonly known as:  COREG  Take 12.5 mg by mouth 2 (two) times daily with a meal.     doxycycline 100 MG capsule  Commonly known as:  VIBRAMYCIN  Take 1 capsule (100 mg total) by mouth 2 (two) times daily.     ferrous sulfate 325 (65 FE) MG EC tablet  Take 1 tablet (325 mg total) by mouth 2 (two) times daily with a meal.     fish oil-omega-3 fatty acids 1000 MG capsule  Take 1 g by mouth daily.     furosemide 40 MG tablet  Commonly known as:  LASIX  Take  2 tablets (80 mg total) by mouth daily as needed (if weight is above 173 lbs).     gabapentin 300 MG capsule  Commonly known as:  NEURONTIN  Take 300 mg by mouth 3 (three) times daily. Take 300 mg in the morning, and 600 mg in the evening.     insulin aspart 100 UNIT/ML injection  Commonly known as:  novoLOG  Inject 6 Units into the skin 3 (three) times daily before meals.     LEVEMIR FLEXPEN 100 UNIT/ML Sopn  Generic drug:  Insulin Detemir  Inject 40 Units into the skin daily.     LIPITOR PO  Take by mouth.     multivitamin with minerals Tabs tablet  Take 1 tablet by mouth daily.     mycophenolate 500 MG tablet  Commonly known as:  CELLCEPT  Take 500 mg by mouth 2 (two) times daily.     nitroGLYCERIN 0.4 MG SL tablet  Commonly known as:  NITROSTAT  Place 0.4 mg under the tongue every 5 (five) minutes as needed for chest pain. x3 doses as needed for chest pain     oxyCODONE-acetaminophen 10-325 MG per tablet  Commonly known as:  PERCOCET  Take 1 tablet by mouth every 4 (four) hours as needed for pain.     predniSONE 5 MG tablet  Commonly known as:  DELTASONE  Take 5 mg by mouth 2 (two) times daily.     ranitidine 300 MG tablet  Commonly known as:  ZANTAC  Take 300 mg by mouth at bedtime.     tobramycin 0.3 % ophthalmic solution  Commonly known as:  TOBREX  Place 1 drop into both eyes as needed (for allergies).     vitamin C 500 MG tablet  Commonly known as:  ASCORBIC ACID  Take 500 mg by mouth daily.       Follow-up Information   Follow up with [facility], MD. Schedule an appointment as soon as possible for a visit in 5 days. (To be seen with repeat labs (CBC & Renal Panel))    Specialty:  Internal Medicine   Contact information:   [address] [place] [address] [phone]       Follow up with Irena Cords, MD. (Call office on Mon 09/03/2013 for an appointment.)    Specialty:  Nephrology   Contact information:   [address] [facility] [place] [address] [phone]        The results of significant diagnostics from this hospitalization (including imaging, microbiology, ancillary and laboratory) are listed below for reference.    Significant Diagnostic Studies: No results found.  Microbiology: No results found for this or any previous visit (from the past 240 hour(s)).   Labs: Basic Metabolic Panel:  Recent Labs Lab 08/31/13 1434 09/01/13 0653 09/02/13 0450  NA  --  138 136  K  --  4.3 3.9  CL  --  102 99  CO2  --  23 22  GLUCOSE  --  44* 67*  BUN  --  69* 67*  CREATININE 2.84* 2.98* 3.12*  CALCIUM  --  8.1* 7.9*  PHOS  --  4.5 4.8*   Liver Function Tests:  Recent Labs Lab 09/01/13 0653 09/02/13 0450  ALBUMIN 2.3* 2.1*   No results found for this basename: LIPASE, AMYLASE,  in the last 168 hours No results found for this basename: AMMONIA,  in the last 168 hours CBC:  Recent Labs Lab 09/01/13 0653 09/02/13 0450  WBC 7.3 6.2  HGB 7.4* 7.4*  HCT 23.5* 23.5*  MCV 93.6 93.6  PLT 117* 122*   Cardiac Enzymes: No results found for this basename: CKTOTAL, CKMB, CKMBINDEX, TROPONINI,  in the last 168 hours BNP: BNP (last 3 results)  Recent Labs  04/09/13 1856  PROBNP 21940.0*   CBG:  Recent Labs Lab 09/01/13 1723 09/01/13 2211 09/02/13 0607 09/02/13 0757 09/02/13 1054  GLUCAP 77 73 72 66* 84     Signed:  Dierdre Mccalip, MD, FACP, FHM. [facility] Pager [phone]  If 7PM-7AM, please contact night-coverage [email or web address] Password [email or web address] 09/02/2013, 11:12 AM

## 2013-09-02 NOTE — Evaluation (Signed)
Physical Therapy Evaluation Patient Details Name: [name] MRN: [id number] DOB: October 19, 1946 Today's Date: 09/02/2013 Time: 4098-1191 PT Time Calculation (min): 39 min  PT Assessment / Plan / Recommendation History of Present Illness  Patient is a 67 yo male admitted with cellulitis LUE.  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  Clinical Impression  Patient presents with problems listed below.  Will benefit from acute PT to maximize independence prior to discharge home with wife.  Recommend patient continue OP PT at [facility] at discharge.    PT Assessment  Patient needs continued PT services    Follow Up Recommendations  Outpatient PT;Supervision/Assistance - 24 hour (Patient to continue OP PT at discharge (at [facility]))    Does the patient have the potential to tolerate intense rehabilitation      Barriers to Discharge        Equipment Recommendations  None recommended by PT    Recommendations for Other Services     Frequency Min 3X/week    Precautions / Restrictions Precautions Precautions: Fall Precaution Comments: Patient has drop foot on rt.  Uses AFO Required Braces or Orthoses: Other Brace/Splint Other Brace/Splint: AFO Rt LE Restrictions Weight Bearing Restrictions: No   Pertinent Vitals/Pain       Mobility  Bed Mobility Bed Mobility: Not assessed (Patient in chair as PT entered room) Transfers Transfers: Sit to Stand;Stand to Sit Sit to Stand: 3: Mod assist;With upper extremity assist;With armrests;From chair/3-in-1 Stand to Sit: 4: Min assist;With upper extremity assist;With armrests;To chair/3-in-1 Details for Transfer Assistance: Verbal cues for hand placement.  Assist to rise to standing from low chair. Ambulation/Gait Ambulation/Gait Assistance: 4: Min guard Ambulation Distance (Feet): 30 Feet Assistive device: Rolling walker Ambulation/Gait Assistance Details:   Foot drop on Rt LE (AFO  not available).  Fatigues quickly. Gait Pattern: Step-through pattern;Decreased stride length;Right steppage;Right foot flat;Decreased dorsiflexion - right Gait velocity: Slow gait speed    Exercises     PT Diagnosis: Difficulty walking;Abnormality of gait;Generalized weakness;Acute pain  PT Problem List: Decreased strength;Decreased activity tolerance;Decreased balance;Decreased mobility;Cardiopulmonary status limiting activity;Pain PT Treatment Interventions: DME instruction;Gait training;Functional mobility training;Patient/family education     PT Goals(Current goals can be found in the care plan section) Acute Rehab PT Goals Patient Stated Goal: To go home soon PT Goal Formulation: With patient Time For Goal Achievement: 09/09/13 Potential to Achieve Goals: Good  Visit Information  Last PT Received On: 09/02/13 Assistance Needed: +1 History of Present Illness: Patient is a 67 yo male admitted with cellulitis LUE.  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       Prior Functioning  Home Living Family/patient expects to be discharged to:: Private residence Living Arrangements: Spouse/significant other Available Help at Discharge: Family;Available 24 hours/day Type of Home: House Home Access: Level entry Home Layout: One level Home Equipment: Walker - 2 wheels;Cane - single point;Shower seat - built in;Hand held shower head;Grab bars - toilet;Grab bars - tub/shower (Hand held shower) Additional Comments: Pt and wife built a fully handicap accessible house.  Prior Function Level of Independence: Needs assistance Gait / Transfers Assistance Needed: Wife provides supervision for ambulation.  Patient uses RW ADL's / Homemaking Assistance Needed: Assist for meal prep, housekeeping, bathing Communication Communication: No difficulties Dominant Hand: Left    Cognition  Cognition Arousal/Alertness:  Awake/alert Behavior During Therapy: WFL for tasks assessed/performed Overall Cognitive Status: Within Functional Limits  for tasks assessed    Extremity/Trunk Assessment Upper Extremity Assessment Upper Extremity Assessment: RUE deficits/detail;LUE deficits/detail RUE Deficits / Details: Decreased strength and ROM in shoulder - grossly 3-/5. LUE Deficits / Details: Decreased strength and ROM in hand due to edema Lower Extremity Assessment Lower Extremity Assessment: Generalized weakness;RLE deficits/detail RLE Deficits / Details: DF/PF trace movement noted Cervical / Trunk Assessment Cervical / Trunk Assessment: Normal   Balance Balance Balance Assessed: Yes Static Standing Balance Static Standing - Balance Support: Bilateral upper extremity supported Static Standing - Level of Assistance: 5: Stand by assistance Static Standing - Comment/# of Minutes: 2 minutes.  Once upright, patient with good balance  End of Session PT - End of Session Equipment Utilized During Treatment: Gait belt Activity Tolerance: Patient limited by fatigue Patient left: in chair;with call bell/phone within reach Nurse Communication: Mobility status  GP     Vena Austria 09/02/2013, 11:12 AM Durenda Hurt. Renaldo Fiddler, [facility] Acute Rehab Services Pager [phone]

## 2013-09-02 NOTE — Progress Notes (Signed)
Hypoglycemic Event  CBG: 53  Treatment: JUICE AND DINNER  Symptoms: NONE  Follow-up CBG: Time:1710 CBG Result:77  Possible Reasons for Event: POOR INTAKE  Comments/MD notified:DR. HONGA;GI    Indigo Barbian, [place] B  Remember to initiate Hypoglycemia Order Set & complete

## 2013-09-02 NOTE — Progress Notes (Signed)
Patient discharge Home per Md order.  Discharge instructions reviewed with patient and family.  Copies of all forms given and explained. Patient/family voiced understanding of all instructions.  Discharge in no acute distress. Colbie Sliker B. Kaina Orengo, RN BC, BSN, MSN 

## 2013-09-02 NOTE — Progress Notes (Signed)
Hypoglycemic Event  CBG: 44  Treatment: juice and breakfast  Symptoms: none  Follow-up CBG: Time: 70  Possible Reasons for Event: POOR INTAKE  Comments/MD notified:Dr. Lance Muss, [place] Hardigree B  Remember to initiate Hypoglycemia Order Set & complete

## 2013-09-02 NOTE — Progress Notes (Signed)
Patient's morning labs showed blood glucose 67 @ 4:50 am lab draw. Patient asymptomatic, CBG 72 @ 6:07.

## 2013-09-03 LAB — PARATHYROID HORMONE, INTACT (NO CA): PTH: 469.8 pg/mL — ABNORMAL HIGH (ref 14.0–72.0)

## 2013-09-03 NOTE — Progress Notes (Signed)
UR Completed. Aylen Stradford RN CCM Case Mgmt 

## 2013-09-04 LAB — CULTURE, BLOOD (SINGLE)

## 2013-09-18 ENCOUNTER — Encounter: Payer: Self-pay | Admitting: Physical Medicine & Rehabilitation

## 2013-09-20 ENCOUNTER — Other Ambulatory Visit: Payer: Self-pay

## 2013-10-06 ENCOUNTER — Emergency Department: Payer: Self-pay | Admitting: Emergency Medicine

## 2013-10-06 LAB — CBC
HCT: 23.9 % — ABNORMAL LOW (ref 40.0–52.0)
HGB: 7.4 g/dL — ABNORMAL LOW (ref 13.0–18.0)
MCH: 28.7 pg (ref 26.0–34.0)
Platelet: 106 10*3/uL — ABNORMAL LOW (ref 150–440)
RDW: 16.5 % — ABNORMAL HIGH (ref 11.5–14.5)
WBC: 12.1 10*3/uL — ABNORMAL HIGH (ref 3.8–10.6)

## 2013-10-06 LAB — URINALYSIS, COMPLETE
Bilirubin,UR: NEGATIVE
Glucose,UR: NEGATIVE mg/dL (ref 0–75)
Ketone: NEGATIVE
Protein: >=500
RBC,UR: 1 /HPF (ref 0–5)
Specific Gravity: 1.014 (ref 1.003–1.030)
WBC UR: 5 /HPF (ref 0–5)

## 2013-10-06 LAB — CK TOTAL AND CKMB (NOT AT ARMC): CK, Total: 102 U/L (ref 35–232)

## 2013-10-06 LAB — COMPREHENSIVE METABOLIC PANEL
Albumin: 2.6 g/dL — ABNORMAL LOW (ref 3.4–5.0)
Alkaline Phosphatase: 57 U/L
Anion Gap: 7 (ref 7–16)
BUN: 120 mg/dL — ABNORMAL HIGH (ref 7–18)
Bilirubin,Total: 0.3 mg/dL (ref 0.2–1.0)
Co2: 20 mmol/L — ABNORMAL LOW (ref 21–32)
Creatinine: 3.69 mg/dL — ABNORMAL HIGH (ref 0.60–1.30)
EGFR (African American): 19 — ABNORMAL LOW
Potassium: 5.1 mmol/L (ref 3.5–5.1)
SGOT(AST): 24 U/L (ref 15–37)
Sodium: 138 mmol/L (ref 136–145)
Total Protein: 4.8 g/dL — ABNORMAL LOW (ref 6.4–8.2)

## 2013-10-07 ENCOUNTER — Encounter (HOSPITAL_COMMUNITY): Payer: Self-pay | Admitting: Emergency Medicine

## 2013-10-07 ENCOUNTER — Emergency Department (HOSPITAL_COMMUNITY): Payer: Medicare Other

## 2013-10-07 ENCOUNTER — Inpatient Hospital Stay (HOSPITAL_COMMUNITY)
Admission: EM | Admit: 2013-10-07 | Discharge: 2013-10-17 | DRG: 237 | Disposition: A | Discharge status: Rehabilitation Facility | Payer: Medicare Other | Attending: Internal Medicine | Admitting: Internal Medicine

## 2013-10-07 DIAGNOSIS — Z940 Kidney transplant status: Secondary | ICD-10-CM

## 2013-10-07 DIAGNOSIS — I313 Pericardial effusion (noninflammatory): Secondary | ICD-10-CM

## 2013-10-07 DIAGNOSIS — G9341 Metabolic encephalopathy: Secondary | ICD-10-CM | POA: Diagnosis present

## 2013-10-07 DIAGNOSIS — E872 Acidosis, unspecified: Secondary | ICD-10-CM | POA: Diagnosis present

## 2013-10-07 DIAGNOSIS — D696 Thrombocytopenia, unspecified: Secondary | ICD-10-CM

## 2013-10-07 DIAGNOSIS — Z9889 Other specified postprocedural states: Secondary | ICD-10-CM

## 2013-10-07 DIAGNOSIS — E1169 Type 2 diabetes mellitus with other specified complication: Secondary | ICD-10-CM | POA: Diagnosis not present

## 2013-10-07 DIAGNOSIS — I3139 Other pericardial effusion (noninflammatory): Secondary | ICD-10-CM

## 2013-10-07 DIAGNOSIS — M009 Pyogenic arthritis, unspecified: Secondary | ICD-10-CM

## 2013-10-07 DIAGNOSIS — I10 Essential (primary) hypertension: Secondary | ICD-10-CM

## 2013-10-07 DIAGNOSIS — Z801 Family history of malignant neoplasm of trachea, bronchus and lung: Secondary | ICD-10-CM

## 2013-10-07 DIAGNOSIS — N2581 Secondary hyperparathyroidism of renal origin: Secondary | ICD-10-CM | POA: Diagnosis present

## 2013-10-07 DIAGNOSIS — I252 Old myocardial infarction: Secondary | ICD-10-CM

## 2013-10-07 DIAGNOSIS — Z8551 Personal history of malignant neoplasm of bladder: Secondary | ICD-10-CM

## 2013-10-07 DIAGNOSIS — C4490 Unspecified malignant neoplasm of skin, unspecified: Secondary | ICD-10-CM | POA: Diagnosis not present

## 2013-10-07 DIAGNOSIS — Y830 Surgical operation with transplant of whole organ as the cause of abnormal reaction of the patient, or of later complication, without mention of misadventure at the time of the procedure: Secondary | ICD-10-CM | POA: Diagnosis present

## 2013-10-07 DIAGNOSIS — Z7901 Long term (current) use of anticoagulants: Secondary | ICD-10-CM

## 2013-10-07 DIAGNOSIS — K219 Gastro-esophageal reflux disease without esophagitis: Secondary | ICD-10-CM | POA: Diagnosis present

## 2013-10-07 DIAGNOSIS — I2589 Other forms of chronic ischemic heart disease: Secondary | ICD-10-CM | POA: Diagnosis present

## 2013-10-07 DIAGNOSIS — E1142 Type 2 diabetes mellitus with diabetic polyneuropathy: Secondary | ICD-10-CM | POA: Diagnosis present

## 2013-10-07 DIAGNOSIS — I319 Disease of pericardium, unspecified: Principal | ICD-10-CM | POA: Diagnosis present

## 2013-10-07 DIAGNOSIS — E11649 Type 2 diabetes mellitus with hypoglycemia without coma: Secondary | ICD-10-CM

## 2013-10-07 DIAGNOSIS — IMO0002 Reserved for concepts with insufficient information to code with codable children: Secondary | ICD-10-CM

## 2013-10-07 DIAGNOSIS — T8610 Unspecified complication of kidney transplant: Secondary | ICD-10-CM | POA: Diagnosis present

## 2013-10-07 DIAGNOSIS — L03115 Cellulitis of right lower limb: Secondary | ICD-10-CM

## 2013-10-07 DIAGNOSIS — L259 Unspecified contact dermatitis, unspecified cause: Secondary | ICD-10-CM | POA: Diagnosis not present

## 2013-10-07 DIAGNOSIS — I4891 Unspecified atrial fibrillation: Secondary | ICD-10-CM

## 2013-10-07 DIAGNOSIS — Z87891 Personal history of nicotine dependence: Secondary | ICD-10-CM

## 2013-10-07 DIAGNOSIS — I5023 Acute on chronic systolic (congestive) heart failure: Secondary | ICD-10-CM

## 2013-10-07 DIAGNOSIS — G8918 Other acute postprocedural pain: Secondary | ICD-10-CM | POA: Diagnosis not present

## 2013-10-07 DIAGNOSIS — E860 Dehydration: Secondary | ICD-10-CM

## 2013-10-07 DIAGNOSIS — M5126 Other intervertebral disc displacement, lumbar region: Secondary | ICD-10-CM

## 2013-10-07 DIAGNOSIS — N186 End stage renal disease: Secondary | ICD-10-CM | POA: Diagnosis present

## 2013-10-07 DIAGNOSIS — R7989 Other specified abnormal findings of blood chemistry: Secondary | ICD-10-CM

## 2013-10-07 DIAGNOSIS — I2489 Other forms of acute ischemic heart disease: Secondary | ICD-10-CM

## 2013-10-07 DIAGNOSIS — L02419 Cutaneous abscess of limb, unspecified: Secondary | ICD-10-CM | POA: Diagnosis present

## 2013-10-07 DIAGNOSIS — Z992 Dependence on renal dialysis: Secondary | ICD-10-CM

## 2013-10-07 DIAGNOSIS — I5043 Acute on chronic combined systolic (congestive) and diastolic (congestive) heart failure: Secondary | ICD-10-CM | POA: Diagnosis present

## 2013-10-07 DIAGNOSIS — I509 Heart failure, unspecified: Secondary | ICD-10-CM

## 2013-10-07 DIAGNOSIS — N179 Acute kidney failure, unspecified: Secondary | ICD-10-CM

## 2013-10-07 DIAGNOSIS — E119 Type 2 diabetes mellitus without complications: Secondary | ICD-10-CM

## 2013-10-07 DIAGNOSIS — E162 Hypoglycemia, unspecified: Secondary | ICD-10-CM

## 2013-10-07 DIAGNOSIS — L0390 Cellulitis, unspecified: Secondary | ICD-10-CM

## 2013-10-07 DIAGNOSIS — I248 Other forms of acute ischemic heart disease: Secondary | ICD-10-CM

## 2013-10-07 DIAGNOSIS — I255 Ischemic cardiomyopathy: Secondary | ICD-10-CM | POA: Diagnosis present

## 2013-10-07 DIAGNOSIS — I32 Pericarditis in diseases classified elsewhere: Secondary | ICD-10-CM | POA: Diagnosis not present

## 2013-10-07 DIAGNOSIS — L97809 Non-pressure chronic ulcer of other part of unspecified lower leg with unspecified severity: Secondary | ICD-10-CM | POA: Diagnosis present

## 2013-10-07 DIAGNOSIS — Z794 Long term (current) use of insulin: Secondary | ICD-10-CM

## 2013-10-07 DIAGNOSIS — R34 Anuria and oliguria: Secondary | ICD-10-CM | POA: Diagnosis not present

## 2013-10-07 DIAGNOSIS — I959 Hypotension, unspecified: Secondary | ICD-10-CM | POA: Diagnosis present

## 2013-10-07 DIAGNOSIS — Z79899 Other long term (current) drug therapy: Secondary | ICD-10-CM

## 2013-10-07 DIAGNOSIS — Z85828 Personal history of other malignant neoplasm of skin: Secondary | ICD-10-CM

## 2013-10-07 DIAGNOSIS — Z833 Family history of diabetes mellitus: Secondary | ICD-10-CM

## 2013-10-07 DIAGNOSIS — F411 Generalized anxiety disorder: Secondary | ICD-10-CM | POA: Diagnosis present

## 2013-10-07 DIAGNOSIS — Z8674 Personal history of sudden cardiac arrest: Secondary | ICD-10-CM

## 2013-10-07 DIAGNOSIS — G8929 Other chronic pain: Secondary | ICD-10-CM | POA: Diagnosis present

## 2013-10-07 DIAGNOSIS — J9600 Acute respiratory failure, unspecified whether with hypoxia or hypercapnia: Secondary | ICD-10-CM

## 2013-10-07 DIAGNOSIS — L97909 Non-pressure chronic ulcer of unspecified part of unspecified lower leg with unspecified severity: Secondary | ICD-10-CM | POA: Diagnosis present

## 2013-10-07 DIAGNOSIS — Z95810 Presence of automatic (implantable) cardiac defibrillator: Secondary | ICD-10-CM

## 2013-10-07 DIAGNOSIS — J9601 Acute respiratory failure with hypoxia: Secondary | ICD-10-CM

## 2013-10-07 DIAGNOSIS — K5900 Constipation, unspecified: Secondary | ICD-10-CM | POA: Diagnosis present

## 2013-10-07 DIAGNOSIS — E1149 Type 2 diabetes mellitus with other diabetic neurological complication: Secondary | ICD-10-CM | POA: Diagnosis present

## 2013-10-07 DIAGNOSIS — I2510 Atherosclerotic heart disease of native coronary artery without angina pectoris: Secondary | ICD-10-CM

## 2013-10-07 DIAGNOSIS — R6510 Systemic inflammatory response syndrome (SIRS) of non-infectious origin without acute organ dysfunction: Secondary | ICD-10-CM

## 2013-10-07 DIAGNOSIS — Z9861 Coronary angioplasty status: Secondary | ICD-10-CM

## 2013-10-07 DIAGNOSIS — Z7982 Long term (current) use of aspirin: Secondary | ICD-10-CM

## 2013-10-07 DIAGNOSIS — D631 Anemia in chronic kidney disease: Secondary | ICD-10-CM

## 2013-10-07 HISTORY — DX: Unspecified systolic (congestive) heart failure: I50.20

## 2013-10-07 HISTORY — DX: Atherosclerotic heart disease of native coronary artery without angina pectoris: I25.10

## 2013-10-07 HISTORY — DX: Chronic kidney disease, stage 4 (severe): N18.4

## 2013-10-07 HISTORY — DX: Personal history of pneumonia (recurrent): Z87.01

## 2013-10-07 HISTORY — DX: Other pericardial effusion (noninflammatory): I31.39

## 2013-10-07 HISTORY — DX: Pericardial effusion (noninflammatory): I31.3

## 2013-10-07 LAB — CBC WITH DIFFERENTIAL/PLATELET
Basophils Absolute: 0 10*3/uL (ref 0.0–0.1)
Basophils Relative: 0 % (ref 0–1)
Eosinophils Absolute: 0 10*3/uL (ref 0.0–0.7)
Eosinophils Relative: 0 % (ref 0–5)
HCT: 24.1 % — ABNORMAL LOW (ref 39.0–52.0)
Lymphocytes Relative: 4 % — ABNORMAL LOW (ref 12–46)
Lymphs Abs: 0.4 10*3/uL — ABNORMAL LOW (ref 0.7–4.0)
MCH: 30.9 pg (ref 26.0–34.0)
MCHC: 33.2 g/dL (ref 30.0–36.0)
MCV: 93.1 fL (ref 78.0–100.0)
Monocytes Absolute: 0.5 10*3/uL (ref 0.1–1.0)
Neutrophils Relative %: 92 % — ABNORMAL HIGH (ref 43–77)
Platelets: 104 10*3/uL — ABNORMAL LOW (ref 150–400)
RBC: 2.59 MIL/uL — ABNORMAL LOW (ref 4.22–5.81)
RDW: 16 % — ABNORMAL HIGH (ref 11.5–15.5)

## 2013-10-07 LAB — COMPREHENSIVE METABOLIC PANEL
ALT: 22 U/L (ref 0–53)
AST: 25 U/L (ref 0–37)
Albumin: 2.7 g/dL — ABNORMAL LOW (ref 3.5–5.2)
Alkaline Phosphatase: 58 U/L (ref 39–117)
Chloride: 105 mEq/L (ref 96–112)
GFR calc non Af Amer: 18 mL/min — ABNORMAL LOW (ref >90)
Glucose, Bld: 68 mg/dL — ABNORMAL LOW (ref 70–99)
Potassium: 4.8 mEq/L (ref 3.5–5.1)
Sodium: 138 mEq/L (ref 135–145)
Total Bilirubin: 0.3 mg/dL (ref 0.3–1.2)
Total Protein: 4.7 g/dL — ABNORMAL LOW (ref 6.0–8.3)

## 2013-10-07 LAB — TROPONIN I: Troponin I: <0.3 ng/mL (ref <0.30)

## 2013-10-07 LAB — GLUCOSE, CAPILLARY: Glucose-Capillary: 70 mg/dL (ref 70–99)

## 2013-10-07 MED ORDER — DEXTROSE 50 % IV SOLN: 1.0000 | Freq: Once | INTRAVENOUS | Status: DC

## 2013-10-07 MED ORDER — DEXTROSE 50 % IV SOLN
25.0000 mL | Freq: Once | INTRAVENOUS | Status: AC
  Administered 2013-10-07: 25 mL via INTRAVENOUS
  Filled 2013-10-07: qty 50

## 2013-10-07 MED ORDER — KCL IN DEXTROSE-NACL 20-5-0.45 MEQ/L-%-% IV SOLN
Freq: Once | INTRAVENOUS | Status: DC
  Filled 2013-10-07: qty 1000

## 2013-10-07 MED ORDER — ALBUTEROL SULFATE (5 MG/ML) 0.5% IN NEBU
2.5000 mg | INHALATION_SOLUTION | RESPIRATORY_TRACT | Status: DC | PRN
  Administered 2013-10-08: 2.5 mg via RESPIRATORY_TRACT
  Filled 2013-10-07: qty 0.5

## 2013-10-07 MED ORDER — IPRATROPIUM BROMIDE 0.02 % IN SOLN
0.5000 mg | Freq: Four times a day (QID) | RESPIRATORY_TRACT | Status: DC
  Administered 2013-10-08 (×2): 0.5 mg via RESPIRATORY_TRACT
  Filled 2013-10-07 (×2): qty 2.5

## 2013-10-07 MED ORDER — HYDROMORPHONE HCL PF 1 MG/ML IJ SOLN
0.5000 mg | INTRAMUSCULAR | Status: AC | PRN
  Administered 2013-10-07 – 2013-10-08 (×2): 0.5 mg via INTRAVENOUS
  Filled 2013-10-07 (×2): qty 1

## 2013-10-07 MED ORDER — ONDANSETRON HCL 4 MG/2ML IJ SOLN
4.0000 mg | Freq: Once | INTRAMUSCULAR | Status: AC
  Administered 2013-10-07: 4 mg via INTRAVENOUS
  Filled 2013-10-07: qty 2

## 2013-10-07 NOTE — ED Notes (Signed)
Spoke with radiology. Pt is next in line for transport.

## 2013-10-07 NOTE — ED Notes (Signed)
James, MD at bedside. 

## 2013-10-07 NOTE — ED Notes (Signed)
CBG checked 144. RN ANNA notified.

## 2013-10-07 NOTE — ED Notes (Signed)
This Rn spoke with the pt's son. He is concerned about the pt because he is not acting himself. Son reports that the pt usually more coherent, and speaks full thoughts in full sentences. Son also reports that the pt thinks that his kidney is going to fail, and that he has had to have fluid drained from his body as recently as last week.

## 2013-10-07 NOTE — H&P (Signed)
PCP:  Dr. Erle Crocker from [facility] Renal: [name] Dr. Bryna Colander  Chief Complaint:  Hypoglycemia   HPI: [name] is a 67 y.o. male   has a past medical history of Myocardial infarction (04/12/1990); CHF (congestive heart failure) (01/2012); Hypertension; Diabetes mellitus; ICD (implantable cardiac defibrillator) in place; Pacemaker (02/15/2012); Pneumonia (2011); Arthritis; History of blood transfusion; Chronic kidney disease; Dysrhythmia; Cancer; Right foot drop; and Cellulitis.   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 Patient have had an MI in 2013. He have had recent laminectomy and hip surgeries. He have had trouble recovering since. Patient have head steady decline of his renal function since June 2014. He reports having low blood sugars today after not eating lunch patient is on Levemir and regular insulin for his diabetes. Patient have been on fluid restriction given worsening renal failure. He is also on Lasix 80 mg PO qd if weight >173. He has not taken lasix today. Denies any chest pain but is visibly severly short of breath. States this has been going on for the past few hours chest x-ray did not show evidence of significant pulmonary edema but he does have evidence of peripheral swelling.  NO Hx of COPD Of note patient had had persistent back pain neurosurgery has been consulted to [facility] and states will come and see him in a.m. Patient has history of extensive skin cancers status post resections  Review of Systems:   Pertinent positives include: shortness of breath at rest.  Constitutional:  No weight loss, night sweats, Fevers, chills, fatigue, weight loss  HEENT:  No headaches, Difficulty swallowing,Tooth/dental problems,Sore throat,  No sneezing, itching, ear ache, nasal congestion, post nasal drip,  Cardio-vascular:  No chest pain, Orthopnea, PND, anasarca, dizziness, palpitations.no  Bilateral lower extremity swelling  GI:  No heartburn, indigestion, abdominal pain, nausea, vomiting, diarrhea, change in bowel habits, loss of appetite, melena, blood in stool, hematemesis Resp:  no  No dyspnea on exertion, No excess mucus, no productive cough, No non-productive cough, No coughing up of blood.No change in color of mucus.No wheezing. Skin:  no rash or lesions. No jaundice GU:  no dysuria, change in color of urine, no urgency or frequency. No straining to urinate.  No flank pain.  Musculoskeletal:  No joint pain or no joint swelling. No decreased range of motion. No back pain.  Psych:  No change in mood or affect. No depression or anxiety. No memory loss.  Neuro: no localizing neurological complaints, no tingling, no weakness, no double vision, no gait abnormality, no slurred speech, no confusion  Otherwise ROS are negative except for above, 10 systems were reviewed  Past Medical History: Past Medical History  Diagnosis Date  . Myocardial infarction 04/12/1990  . CHF (congestive heart failure) 01/2012  . Hypertension   . Diabetes mellitus   . ICD (implantable cardiac defibrillator) in place   . Pacemaker 02/15/2012    [employer]  . Pneumonia 2011  . Arthritis   . History of blood transfusion   . Chronic kidney disease     Glomerulonephritis  . Dysrhythmia   . Cancer     Bladder Cancer- 2004 .  SkinCancer- Basal, Squamous, and a few melymona  . Right foot drop   . Cellulitis    Past Surgical History  Procedure Laterality Date  . Kidney transplant  02/13/1973  . Coronary angioplasty  03/1990  . Skin cancer excision  Numerous  . Eye surgery      Cataract Right Eye  . Insert / replace / remove pacemaker    . Uretheral transplation      Left to Right (transplanted kidney)  . Skin transplant      Left thigh to Left hand  . Knee arthroscopy  07/25/2012    Procedure: ARTHROSCOPY KNEE;  Surgeon: Cammy Copa, MD;  Location: [facility] OR;  Service:  Orthopedics;  Laterality: Left;  Left knee arthroscopy, debridement, cultures  . Lumbar laminectomy/decompression microdiscectomy Bilateral 03/26/2013    Procedure: LUMBAR LAMINECTOMY/DECOMPRESSION MICRODISCECTOMY 1 LEVEL;  Surgeon: Reinaldo Meeker, MD;  Location: [facility];  Service: Neurosurgery;  Laterality: Bilateral;  . Lumbar laminectomy/decompression microdiscectomy N/A 04/19/2013    Procedure: Redo Lumbar Three to Four Decompression One LEVEL;  Surgeon: Cristi Loron, MD;  Location: [facility];  Service: Neurosurgery;  Laterality: N/A;  Redo Lumbar Three to Four Decompression One LEVEL     Medications: Prior to Admission medications   Medication Sig Start Date End Date Taking? Authorizing Provider  ALPRAZolam (XANAX) 0.25 MG tablet Take 0.25 mg by mouth 2 (two) times daily.    Yes Historical Provider, MD  amLODipine (NORVASC) 5 MG tablet Take 5 mg by mouth 2 (two) times daily. 09/25/13  Yes Historical Provider, MD  aspirin EC 81 MG tablet Take 81 mg by mouth every evening.    Yes Historical Provider, MD  atorvastatin (LIPITOR) 80 MG tablet Take 80 mg by mouth 2 (two) times daily.   Yes Historical Provider, MD  carvedilol (COREG) 12.5 MG tablet Take 12.5 mg by mouth 2 (two) times daily with a meal.   Yes Historical Provider, MD  ferrous sulfate 325 (65 FE) MG EC tablet Take 1 tablet (325 mg total) by mouth 2 (two) times daily with a meal. 09/02/13  Yes Elease Etienne, MD  fish oil-omega-3 fatty acids 1000 MG capsule Take 1 g by mouth daily.   Yes Historical Provider, MD  furosemide (LASIX) 40 MG tablet Take 2 tablets (80 mg total) by mouth daily as needed (if weight is above 173 lbs). 05/04/13  Yes Evlyn Kanner Love, PA-C  gabapentin (NEURONTIN) 300 MG capsule Take 300 mg by mouth 2 (two) times daily.   Yes Historical Provider, MD  HYDROcodone-acetaminophen (NORCO) 10-325 MG per tablet Take 1 tablet by mouth every 4 (four) hours as needed for moderate pain.  09/24/13  Yes Historical  Provider, MD  insulin aspart (NOVOLOG) 100 UNIT/ML injection Inject 6 Units into the skin 3 (three) times daily before meals.   Yes Historical Provider, MD  Insulin Detemir (LEVEMIR FLEXPEN) 100 UNIT/ML SOPN Inject 40 Units into the skin every morning.    Yes Historical Provider, MD  Multiple Vitamin (MULTIVITAMIN WITH MINERALS) TABS Take 1 tablet by mouth daily.   Yes Historical Provider, MD  mycophenolate (CELLCEPT) 500 MG tablet Take 500 mg by mouth 2 (two) times daily.    Yes Historical Provider, MD  nitroGLYCERIN (NITROSTAT) 0.4 MG SL tablet Place 0.4 mg under the tongue every 5 (five) minutes as needed for chest pain. x3 doses as needed for chest pain   Yes Historical Provider, MD  predniSONE (DELTASONE) 5 MG tablet Take 5 mg by mouth 2 (two) times daily.   Yes Historical Provider, MD  primidone (MYSOLINE) 50 MG tablet Take 50 mg by mouth at bedtime.  09/28/13  Yes Historical Provider, MD  ranitidine (ZANTAC) 300 MG tablet Take 300 mg by mouth at  bedtime.    Yes Historical Provider, MD  tobramycin (TOBREX) 0.3 % ophthalmic solution Place 1 drop into both eyes as needed (for allergies).   Yes Historical Provider, MD  vitamin C (ASCORBIC ACID) 500 MG tablet Take 500 mg by mouth daily.   Yes Historical Provider, MD    Allergies:  No Known Allergies  Social History:  Ambulatory  walker   Lives at  home   reports that he quit smoking about 30 years ago. He has never used smokeless tobacco. He reports that he does not drink alcohol or use illicit drugs.   Family History: family history includes Diabetes Mellitus II in his mother; Lung cancer in his father.    Physical Exam: Patient Vitals for the past 24 hrs:  BP Temp Temp src Pulse Resp SpO2  10/07/13 2245 129/55 mmHg - - 59 19 98 %  10/07/13 2145 127/72 mmHg - - 68 21 97 %  10/07/13 2100 109/84 mmHg - - 68 23 97 %  10/07/13 1949 128/59 mmHg - - - 26 97 %  10/07/13 1832 124/65 mmHg 97.6 F (36.4 C) Oral 67 22 94 %    1.  General:  in No Acute distress 2. Psychological: Alert and  Oriented 3. Head/ENT:   Moist   Mucous Membranes                          Head Non traumatic, neck supple                          Normal   Dentition 4. SKIN: normal   Skin turgor,  Skin clean Dry numerous skin cancers present areas of ulceration noted over lower extremities 5. Heart: Regular rate and rhythm no Murmur, Rub or gallop 6. Lungs:   no wheezes or crackles but poor air movement   7. Abdomen: Soft, non-tender, Non distended 8. Lower extremities: no clubbing, cyanosis, 2+ edema 9. Neurologically Grossly intact, moving all 4 extremities equally 10. MSK: Normal range of motion  body mass index is unknown because there is no weight on file.   Labs on Admission:   Recent Labs  10/07/13 1902  NA 138  K 4.8  CL 105  CO2 16*  GLUCOSE 68*  BUN 112*  CREATININE 3.28*  CALCIUM 8.2*    Recent Labs  10/07/13 1902  AST 25  ALT 22  ALKPHOS 58  BILITOT 0.3  PROT 4.7*  ALBUMIN 2.7*   No results found for this basename: LIPASE, AMYLASE,  in the last 72 hours  Recent Labs  10/07/13 1902  WBC 12.3*  NEUTROABS 11.3*  HGB 8.0*  HCT 24.1*  MCV 93.1  PLT 104*    Recent Labs  10/07/13 2008  TROPONINI <0.30   No results found for this basename: TSH, T4TOTAL, FREET3, T3FREE, THYROIDAB,  in the last 72 hours No results found for this basename: VITAMINB12, FOLATE, FERRITIN, TIBC, IRON, RETICCTPCT,  in the last 72 hours Lab Results  Component Value Date   HGBA1C 5.9* 04/11/2013    The CrCl is unknown because both a height and weight (above a minimum accepted value) are required for this calculation. ABG    Component Value Date/Time   PHART 7.464* 04/09/2013 1900   HCO3 25.3* 04/09/2013 1900   TCO2 26 04/09/2013 1900   O2SAT 99.0 04/09/2013 1900     Lab Results  Component Value Date   DDIMER >20.00*  04/09/2013     Other results:  I have pearsonaly reviewed this: ECG REPORT  Rate: 67  Rhythm:  paced ST&T Change:    Cultures:    Component Value Date/Time   SDES STOOL 04/16/2013 1438   SPECREQUEST NONE 04/16/2013 1438   CULT  Value: NO SALMONELLA, SHIGELLA, CAMPYLOBACTER, YERSINIA, OR E.COLI 0157:[id number] ISOLATED Note: REDUCED NORMAL FLORA PRESENT 04/16/2013 1438   REPTSTATUS 04/20/2013 FINAL 04/16/2013 1438       Radiological Exams on Admission: Dg Chest 2 View  10/07/2013   CLINICAL DATA:  Altered mental status  EXAM: CHEST  2 VIEW  COMPARISON:  04/11/2013  FINDINGS: Severe cardiac enlargement. This is stable. Pacer is unchanged. Lungs are clear.  IMPRESSION: No active cardiopulmonary disease. Stable severe cardiac enlargement.   Electronically Signed   By: Esperanza Heir M.D.   On: 10/07/2013 21:24    Chart has been reviewed  Assessment/Plan  67 year old gentleman presenting with hypoglycemia in the setting of worsening renal function with a history of a renal transplant. Also presents in the mild fluid overload and significant shortness of breath.   Present on Admission:  . Hypoglycemia - will admit the hypoglycemia protocol this is most likely secondary to worsening insulin clearance use to worsening renal function  . Diabetes mellitus type 2, controlled - hold insulin since his sliding scale to be resumed once the blood sugar stabilizes  . Renal failure (ARF), acute on chronic kidney disease stage 3 - renal is aware will see patient in the morning. For now we'll continue with IV Lasix  . Anemia in chronic kidney disease - stable continue to monitor transfuse as needed  . HTN (hypertension) - continue home medications  . Acute respiratory failure with hypoxia - patient appears to be worsening shortness of breath chest x-rays are consistent with severe fluid overload but has evidence of severe cardiomegaly which is stable from prior. We'll cycle cardiac enzymes obtain repeat echo gram given history of pericardial effusion the past. Would have low threshold for evaluating for PE with  VQ scan if symptoms do not improve with Lasix.    Prophylaxis:  Lovenox, Protonix  CODE STATUS: FULL CODE  Other plan as per orders.  I have spent a total of 65 min on this admission extra time taken to discuss care with renal  [name] 10/07/2013, 11:58 PM

## 2013-10-07 NOTE — ED Notes (Signed)
Per EMS: pt fell yesterday and seen at [facility] and d/c. Pt became lethargic today and very lethargic upon EMS arrival. Pt would no answer questions or look at EMS. Pt sugar CBG 32 EMS gave 1mg  glucagon IM in right buttock and 25g D50 repeat CBG 90. Pt states he normally runs 90. Pt short of breath and placed on 2 L. Pt alert upon arrival to ED. 20 in R shoulder. Pt has pacemaker. 120/88 given 500cc fluids en route. Pt c/o pain in legs. Pt has skin cancer lesions. Pt is brittle diabetic.

## 2013-10-07 NOTE — ED Provider Notes (Addendum)
CSN: [id number]     Arrival date & time 10/07/13  1817 History   First MD Initiated Contact with Patient 10/07/13 1817     Chief Complaint  Patient presents with  . Altered Mental Status  . Leg Pain    HPI  Patient presents here from home with an altered mental status. His history of diabetes. He took his regular doses of NovoLog and Levemir this morning.  He poorly. He was found to be with altered no status and unresponsive. Paramedics were called. They arrived to find him with a blood sugar of 30. He was given IM glucagon. His small peripheral IV placed. He was given an amp of D50. His mental status improved en route. His was seen at [facility] ER last night with a fall. He has some pain in his legs. He has had some pain in his legs for more than a year previous to spinal decompressive surgeries done. He is scheduled for some additional testing with Dr. Lovell Sheehan. He's had difficulty getting up and around at home. He has not had frank incontinence. Admits he eats poorly as it is quite an effort for him to be up and around. He has developed some wounds on both legs. He has not seen anybody about this currently.  Past Medical History  Diagnosis Date  . Myocardial infarction 04/12/1990  . CHF (congestive heart failure) 01/2012  . Hypertension   . Diabetes mellitus   . ICD (implantable cardiac defibrillator) in place   . Pacemaker 02/15/2012    [employer]  . Pneumonia 2011  . Arthritis   . History of blood transfusion   . Chronic kidney disease     Glomerulonephritis  . Dysrhythmia   . Cancer     Bladder Cancer- 2004 .  SkinCancer- Basal, Squamous, and a few melymona  . Right foot drop   . Cellulitis    Past Surgical History  Procedure Laterality Date  . Kidney transplant  02/13/1973  . Coronary angioplasty  03/1990  . Skin cancer excision      Numerous  . Eye surgery      Cataract Right Eye  . Insert / replace / remove pacemaker    . Uretheral transplation      Left to Right  (transplanted kidney)  . Skin transplant      Left thigh to Left hand  . Knee arthroscopy  07/25/2012    Procedure: ARTHROSCOPY KNEE;  Surgeon: Cammy Copa, MD;  Location: [facility] OR;  Service: Orthopedics;  Laterality: Left;  Left knee arthroscopy, debridement, cultures  . Lumbar laminectomy/decompression microdiscectomy Bilateral 03/26/2013    Procedure: LUMBAR LAMINECTOMY/DECOMPRESSION MICRODISCECTOMY 1 LEVEL;  Surgeon: Reinaldo Meeker, MD;  Location: [facility];  Service: Neurosurgery;  Laterality: Bilateral;  . Lumbar laminectomy/decompression microdiscectomy N/A 04/19/2013    Procedure: Redo Lumbar Three to Four Decompression One LEVEL;  Surgeon: Cristi Loron, MD;  Location: [facility];  Service: Neurosurgery;  Laterality: N/A;  Redo Lumbar Three to Four Decompression One LEVEL   Family History  Problem Relation Age of Onset  . Diabetes Mellitus II Mother   . Lung cancer Father    History  Substance Use Topics  . Smoking status: Former Smoker -- 30 years    Quit date: 02/14/1983  . Smokeless tobacco: Never Used  . Alcohol Use: No    Review of Systems  Constitutional: Negative for fever, chills, diaphoresis, appetite change and fatigue.  HENT: Negative for mouth sores, sore throat and  trouble swallowing.   Eyes: Negative for visual disturbance.  Respiratory: Negative for cough, chest tightness, shortness of breath and wheezing.   Cardiovascular: Negative for chest pain.  Gastrointestinal: Negative for nausea, vomiting, abdominal pain, diarrhea and abdominal distention.  Endocrine: Negative for polydipsia, polyphagia and polyuria.  Genitourinary: Negative for dysuria, frequency and hematuria.  Musculoskeletal: Positive for arthralgias, back pain, gait problem and myalgias. Negative for neck pain.  Skin: Positive for wound. Negative for color change, pallor and rash.  Neurological: Negative for dizziness, syncope, light-headedness and headaches.       . Unresponsiveness  and low blood sugar  Hematological: Does not bruise/bleed easily.  Psychiatric/Behavioral: Negative for behavioral problems and confusion.    Allergies  Review of patient's allergies indicates no known allergies.  Home Medications   Current Outpatient Rx  Name  Route  Sig  Dispense  Refill  . ALPRAZolam (XANAX) 0.25 MG tablet   Oral   Take 0.25 mg by mouth 2 (two) times daily.          Marland Kitchen amLODipine (NORVASC) 5 MG tablet   Oral   Take 5 mg by mouth 2 (two) times daily.         Marland Kitchen aspirin EC 81 MG tablet   Oral   Take 81 mg by mouth every evening.          Marland Kitchen atorvastatin (LIPITOR) 80 MG tablet   Oral   Take 80 mg by mouth 2 (two) times daily.         . carvedilol (COREG) 12.5 MG tablet   Oral   Take 12.5 mg by mouth 2 (two) times daily with a meal.         . ferrous sulfate 325 (65 FE) MG EC tablet   Oral   Take 1 tablet (325 mg total) by mouth 2 (two) times daily with a meal.   60 tablet   0   . fish oil-omega-3 fatty acids 1000 MG capsule   Oral   Take 1 g by mouth daily.         . furosemide (LASIX) 40 MG tablet   Oral   Take 2 tablets (80 mg total) by mouth daily as needed (if weight is above 173 lbs).   60 tablet   1   . gabapentin (NEURONTIN) 300 MG capsule   Oral   Take 300 mg by mouth 2 (two) times daily.         Marland Kitchen HYDROcodone-acetaminophen (NORCO) 10-325 MG per tablet   Oral   Take 1 tablet by mouth every 4 (four) hours as needed for moderate pain.          Marland Kitchen insulin aspart (NOVOLOG) 100 UNIT/ML injection   Subcutaneous   Inject 6 Units into the skin 3 (three) times daily before meals.         . Insulin Detemir (LEVEMIR FLEXPEN) 100 UNIT/ML SOPN   Subcutaneous   Inject 40 Units into the skin every morning.          . Multiple Vitamin (MULTIVITAMIN WITH MINERALS) TABS   Oral   Take 1 tablet by mouth daily.         . mycophenolate (CELLCEPT) 500 MG tablet   Oral   Take 500 mg by mouth 2 (two) times daily.          .  nitroGLYCERIN (NITROSTAT) 0.4 MG SL tablet   Sublingual   Place 0.4 mg under the tongue every 5 (five) minutes as  needed for chest pain. x3 doses as needed for chest pain         . predniSONE (DELTASONE) 5 MG tablet   Oral   Take 5 mg by mouth 2 (two) times daily.         . primidone (MYSOLINE) 50 MG tablet   Oral   Take 50 mg by mouth at bedtime.          . ranitidine (ZANTAC) 300 MG tablet   Oral   Take 300 mg by mouth at bedtime.          Marland Kitchen tobramycin (TOBREX) 0.3 % ophthalmic solution   Both Eyes   Place 1 drop into both eyes as needed (for allergies).         . vitamin C (ASCORBIC ACID) 500 MG tablet   Oral   Take 500 mg by mouth daily.          BP 129/55  Pulse 59  Temp(Src) 97.6 F (36.4 C) (Oral)  Resp 19  SpO2 98% Physical Exam  Constitutional:  Pleasant and poorly kept appearing adult male.  HENT:  Dry mucous membranes  Eyes:  Conjunctiva are not pale or injected  Cardiovascular: S1 normal and S2 normal.   Pulmonary/Chest:  Lungs are clear  Neurological:  He has no marked weakness of either leg. He has intact reflexes. He has decreased sensation and pain to lower extremities. No asymmetric findings to suggest acute herniated nucleus. Decreased sensation consistent with neuropathy.  Skin:  Ulcerations to the left lower leg. 2 x 2 centimeters left lateral and anterior leg. Granulating. No sign of cellulitis    ED Course  Procedures (including critical care time) Labs Review Labs Reviewed  COMPREHENSIVE METABOLIC PANEL - Abnormal; Notable for the following:    CO2 16 (*)    Glucose, Bld 68 (*)    BUN 112 (*)    Creatinine, Ser 3.28 (*)    Calcium 8.2 (*)    Total Protein 4.7 (*)    Albumin 2.7 (*)    GFR calc non Af Amer 18 (*)    GFR calc Af Amer 21 (*)    All other components within normal limits  CBC WITH DIFFERENTIAL - Abnormal; Notable for the following:    WBC 12.3 (*)    RBC 2.59 (*)    Hemoglobin 8.0 (*)    HCT 24.1 (*)     RDW 16.0 (*)    Platelets 104 (*)    Neutrophils Relative % 92 (*)    Neutro Abs 11.3 (*)    Lymphocytes Relative 4 (*)    Lymphs Abs 0.4 (*)    All other components within normal limits  GLUCOSE, CAPILLARY - Abnormal; Notable for the following:    Glucose-Capillary 144 (*)    All other components within normal limits  GLUCOSE, CAPILLARY - Abnormal; Notable for the following:    Glucose-Capillary 136 (*)    All other components within normal limits  GLUCOSE, CAPILLARY  GLUCOSE, CAPILLARY  TROPONIN I  URINALYSIS, ROUTINE W REFLEX MICROSCOPIC   Imaging Review Dg Chest 2 View  10/07/2013   CLINICAL DATA:  Altered mental status  EXAM: CHEST  2 VIEW  COMPARISON:  04/11/2013  FINDINGS: Severe cardiac enlargement. This is stable. Pacer is unchanged. Lungs are clear.  IMPRESSION: No active cardiopulmonary disease. Stable severe cardiac enlargement.   Electronically Signed   By: Esperanza Heir M.D.   On: 10/07/2013 21:24    EKG Interpretation  Date/Time:  Sunday October 07 2013 22:47:20 EST Ventricular Rate:  67 PR Interval:    QRS Duration: 160 QT Interval:  439 QTC Calculation: 463 R Axis:   -83 Text Interpretation:  ATRIAL PACED RHYTHM Confirmed by Fayrene Fearing  MD, Renita Brocks ([id number]) on 10/08/2013 12:15:24 AM            MDM   1. Hypoglycemia     Discussion patient really P. Littles a lot of his pain symptoms. His BUN is elevated and his creatinine is above his baseline. He is a 67 year old renal transplant. His white blood cell count is 12,000. His x-ray shows cardiomegaly without failure. His EKG shows a paced rhythm his troponin is normal.  He was placed on D5 half-normal saline for an hour while here. His blood sugar only increased to 90 prehospital with the above D50. It decreased to 70. He was given a meal. His blood sugar still remained only 60. He was given an additional amp of D50. After the meal and one hour off of the D5 his blood sugar was over 100. He was given 0.5 mg  Dilaudid. He has some pain control.  He has a great deal of difficulty standing or walking. His family states he had difficulty caring for him at home. The patient is optimistic and very pleasant. I received a call from Dr. Venetia Maxon covering for Dr. Lovell Sheehan. We discussed his difficulties with pain and with his back. Patient lives with her distance from the hospital closer to [place]. With his labile blood sugars and pain I think admission the hospital be warranted for glycemic control. He clinically and per his labs would appear dehydrated. He cannot tolerate large volumes of fluid as in history of congestive heart failure and renal insufficiency. Please call hospitalist. Plan will be admission. Neurosurgical consultation.  Dr. Venetia Maxon assures me that the patient will be seen by neurosurgery.    Roney Marion, MD 10/07/13 6045  Roney Marion, MD 10/08/13 [phone]

## 2013-10-08 ENCOUNTER — Inpatient Hospital Stay (HOSPITAL_COMMUNITY): Payer: Medicare Other

## 2013-10-08 ENCOUNTER — Inpatient Hospital Stay (HOSPITAL_COMMUNITY): Payer: Medicare Other | Admitting: Certified Registered Nurse Anesthetist

## 2013-10-08 ENCOUNTER — Encounter (HOSPITAL_COMMUNITY): Payer: Self-pay | Admitting: Physician Assistant

## 2013-10-08 ENCOUNTER — Encounter (HOSPITAL_COMMUNITY): Payer: Medicare Other | Admitting: Certified Registered Nurse Anesthetist

## 2013-10-08 ENCOUNTER — Encounter (HOSPITAL_COMMUNITY)
Admission: EM | Disposition: A | Discharge status: Rehabilitation Facility | Payer: Self-pay | Source: Home / Self Care | Attending: Pulmonary Disease

## 2013-10-08 DIAGNOSIS — I309 Acute pericarditis, unspecified: Secondary | ICD-10-CM

## 2013-10-08 DIAGNOSIS — I319 Disease of pericardium, unspecified: Secondary | ICD-10-CM

## 2013-10-08 DIAGNOSIS — N179 Acute kidney failure, unspecified: Secondary | ICD-10-CM

## 2013-10-08 DIAGNOSIS — I2510 Atherosclerotic heart disease of native coronary artery without angina pectoris: Secondary | ICD-10-CM

## 2013-10-08 DIAGNOSIS — I5023 Acute on chronic systolic (congestive) heart failure: Secondary | ICD-10-CM

## 2013-10-08 HISTORY — PX: SUBXYPHOID PERICARDIAL WINDOW: SHX5075

## 2013-10-08 LAB — COMPREHENSIVE METABOLIC PANEL
ALT: 23 U/L (ref 0–53)
ALT: 19 U/L (ref 0–53)
AST: 21 U/L (ref 0–37)
AST: 16 U/L (ref 0–37)
Albumin: 2.6 g/dL — ABNORMAL LOW (ref 3.5–5.2)
Albumin: 2.4 g/dL — ABNORMAL LOW (ref 3.5–5.2)
Alkaline Phosphatase: 58 U/L (ref 39–117)
Alkaline Phosphatase: 55 U/L (ref 39–117)
BUN: 114 mg/dL — ABNORMAL HIGH (ref 6–23)
CO2: 17 mEq/L — ABNORMAL LOW (ref 19–32)
CO2: 17 mEq/L — ABNORMAL LOW (ref 19–32)
Calcium: 8.3 mg/dL — ABNORMAL LOW (ref 8.4–10.5)
Calcium: 8 mg/dL — ABNORMAL LOW (ref 8.4–10.5)
Chloride: 104 mEq/L (ref 96–112)
Chloride: 102 mEq/L (ref 96–112)
Creatinine, Ser: 3.45 mg/dL — ABNORMAL HIGH (ref 0.50–1.35)
Creatinine, Ser: 3.43 mg/dL — ABNORMAL HIGH (ref 0.50–1.35)
GFR calc Af Amer: 20 mL/min — ABNORMAL LOW (ref >90)
GFR calc non Af Amer: 17 mL/min — ABNORMAL LOW (ref >90)
GFR calc non Af Amer: 17 mL/min — ABNORMAL LOW (ref >90)
Glucose, Bld: 149 mg/dL — ABNORMAL HIGH (ref 70–99)
Potassium: 4.9 mEq/L (ref 3.5–5.1)
Potassium: 5.1 mEq/L (ref 3.5–5.1)
Sodium: 136 mEq/L (ref 135–145)
Sodium: 135 mEq/L (ref 135–145)
Total Bilirubin: 0.2 mg/dL — ABNORMAL LOW (ref 0.3–1.2)
Total Bilirubin: 0.2 mg/dL — ABNORMAL LOW (ref 0.3–1.2)
Total Protein: 4.7 g/dL — ABNORMAL LOW (ref 6.0–8.3)

## 2013-10-08 LAB — BLOOD GAS, ARTERIAL
Acid-base deficit: 7.5 mmol/L — ABNORMAL HIGH (ref 0.0–2.0)
Acid-base deficit: 8.6 mmol/L — ABNORMAL HIGH (ref 0.0–2.0)
Bicarbonate: 17.5 mEq/L — ABNORMAL LOW (ref 20.0–24.0)
O2 Content: 3 L/min
O2 Content: 10 L/min
O2 Saturation: 97.8 %
O2 Saturation: 98.1 %
Patient temperature: 98.6
Patient temperature: 98.6
TCO2: 18.5 mmol/L (ref 0–100)
TCO2: 18 mmol/L (ref 0–100)
pCO2 arterial: 35 mmHg (ref 35.0–45.0)
pCO2 arterial: 36.8 mmHg (ref 35.0–45.0)
pH, Arterial: 7.319 — ABNORMAL LOW (ref 7.350–7.450)
pO2, Arterial: 96.5 mmHg (ref 80.0–100.0)

## 2013-10-08 LAB — CBC WITH DIFFERENTIAL/PLATELET
Basophils Absolute: 0 10*3/uL (ref 0.0–0.1)
Basophils Relative: 0 % (ref 0–1)
Eosinophils Relative: 1 % (ref 0–5)
Lymphocytes Relative: 4 % — ABNORMAL LOW (ref 12–46)
Lymphs Abs: 0.5 10*3/uL — ABNORMAL LOW (ref 0.7–4.0)
MCHC: 31.3 g/dL (ref 30.0–36.0)
MCV: 95.4 fL (ref 78.0–100.0)
Monocytes Absolute: 0.4 10*3/uL (ref 0.1–1.0)
Neutro Abs: 9.8 10*3/uL — ABNORMAL HIGH (ref 1.7–7.7)
Neutrophils Relative %: 91 % — ABNORMAL HIGH (ref 43–77)
Platelets: 124 10*3/uL — ABNORMAL LOW (ref 150–400)
RBC: 2.61 MIL/uL — ABNORMAL LOW (ref 4.22–5.81)
RDW: 16.2 % — ABNORMAL HIGH (ref 11.5–15.5)
WBC: 10.8 10*3/uL — ABNORMAL HIGH (ref 4.0–10.5)

## 2013-10-08 LAB — GLUCOSE, CAPILLARY
Glucose-Capillary: 108 mg/dL — ABNORMAL HIGH (ref 70–99)
Glucose-Capillary: 87 mg/dL (ref 70–99)
Glucose-Capillary: 148 mg/dL — ABNORMAL HIGH (ref 70–99)
Glucose-Capillary: 158 mg/dL — ABNORMAL HIGH (ref 70–99)

## 2013-10-08 LAB — GRAM STAIN

## 2013-10-08 LAB — D-DIMER, QUANTITATIVE (NOT AT ARMC): D-Dimer, Quant: 2.59 ug/mL-FEU — ABNORMAL HIGH (ref 0.00–0.48)

## 2013-10-08 LAB — BODY FLUID CELL COUNT WITH DIFFERENTIAL
Lymphs, Fluid: 2 %
Monocyte-Macrophage-Serous Fluid: 70 % (ref 50–90)
Neutrophil Count, Fluid: 28 % — ABNORMAL HIGH (ref 0–25)

## 2013-10-08 LAB — CBC
HCT: 23.4 % — ABNORMAL LOW (ref 39.0–52.0)
Hemoglobin: 7.2 g/dL — ABNORMAL LOW (ref 13.0–17.0)
MCH: 29 pg (ref 26.0–34.0)
MCHC: 30.8 g/dL (ref 30.0–36.0)
MCV: 94.4 fL (ref 78.0–100.0)
Platelets: 103 10*3/uL — ABNORMAL LOW (ref 150–400)
RBC: 2.48 MIL/uL — ABNORMAL LOW (ref 4.22–5.81)
RDW: 16.2 % — ABNORMAL HIGH (ref 11.5–15.5)
WBC: 8.6 10*3/uL (ref 4.0–10.5)

## 2013-10-08 LAB — PROTIME-INR
INR: 1.21 (ref 0.00–1.49)
Prothrombin Time: 15 seconds (ref 11.6–15.2)

## 2013-10-08 LAB — MRSA PCR SCREENING: MRSA by PCR: NEGATIVE

## 2013-10-08 LAB — URINALYSIS, ROUTINE W REFLEX MICROSCOPIC
Bilirubin Urine: NEGATIVE
Hgb urine dipstick: NEGATIVE
Nitrite: NEGATIVE
Specific Gravity, Urine: 1.015 (ref 1.005–1.030)
Urobilinogen, UA: 0.2 mg/dL (ref 0.0–1.0)
pH: 5 (ref 5.0–8.0)

## 2013-10-08 LAB — HEMOGLOBIN A1C
Hgb A1c MFr Bld: 6.2 % — ABNORMAL HIGH (ref <5.7)
Mean Plasma Glucose: 131 mg/dL — ABNORMAL HIGH (ref <117)

## 2013-10-08 LAB — PROTEIN, BODY FLUID

## 2013-10-08 LAB — FERRITIN: Ferritin: 524 ng/mL — ABNORMAL HIGH (ref 22–322)

## 2013-10-08 LAB — GLUCOSE, SEROUS FLUID

## 2013-10-08 LAB — APTT: aPTT: 29 seconds (ref 24–37)

## 2013-10-08 LAB — URINE MICROSCOPIC-ADD ON

## 2013-10-08 LAB — LACTATE DEHYDROGENASE, PLEURAL OR PERITONEAL FLUID: LD, Fluid: 155 U/L — ABNORMAL HIGH (ref 3–23)

## 2013-10-08 LAB — IRON AND TIBC: UIBC: 159 ug/dL (ref 125–400)

## 2013-10-08 LAB — PREPARE RBC (CROSSMATCH)

## 2013-10-08 LAB — TROPONIN I
Troponin I: <0.3 ng/mL (ref <0.30)
Troponin I: <0.3 ng/mL (ref <0.30)
Troponin I: <0.3 ng/mL (ref <0.30)

## 2013-10-08 LAB — ABO/RH: ABO/RH(D): A POS

## 2013-10-08 SURGERY — CREATION, PERICARDIAL WINDOW, SUBXIPHOID APPROACH
Anesthesia: General | Site: Chest | Wound class: Clean

## 2013-10-08 MED ORDER — SODIUM CHLORIDE 0.9 % IV SOLN: 100.0000 mL | INTRAVENOUS | Status: DC | PRN

## 2013-10-08 MED ORDER — LIDOCAINE HCL (PF) 1 % IJ SOLN: 5.0000 mL | INTRAMUSCULAR | Status: DC | PRN

## 2013-10-08 MED ORDER — WHITE PETROLATUM GEL
Status: AC
  Administered 2013-10-08: 11:00:00
  Filled 2013-10-08: qty 5

## 2013-10-08 MED ORDER — GABAPENTIN 300 MG PO CAPS
300.0000 mg | ORAL_CAPSULE | Freq: Two times a day (BID) | ORAL | Status: DC
  Administered 2013-10-08 – 2013-10-09 (×3): 300 mg via ORAL
  Filled 2013-10-08 (×7): qty 1

## 2013-10-08 MED ORDER — FERROUS SULFATE 325 (65 FE) MG PO TABS
325.0000 mg | ORAL_TABLET | Freq: Two times a day (BID) | ORAL | Status: DC
  Administered 2013-10-08 – 2013-10-09 (×3): 325 mg via ORAL
  Filled 2013-10-08 (×7): qty 1

## 2013-10-08 MED ORDER — NEPRO/CARBSTEADY PO LIQD: 237.0000 mL | ORAL | Status: DC | PRN

## 2013-10-08 MED ORDER — LIDOCAINE HCL (CARDIAC) 20 MG/ML IV SOLN
INTRAVENOUS | Status: DC | PRN
  Administered 2013-10-08: 60 mg via INTRAVENOUS

## 2013-10-08 MED ORDER — PHENYLEPHRINE HCL 10 MG/ML IJ SOLN
10.0000 mg | INTRAVENOUS | Status: DC | PRN
  Administered 2013-10-08: 25 ug/min via INTRAVENOUS

## 2013-10-08 MED ORDER — ONDANSETRON HCL 4 MG/2ML IJ SOLN
4.0000 mg | Freq: Four times a day (QID) | INTRAMUSCULAR | Status: DC | PRN
  Filled 2013-10-08: qty 2

## 2013-10-08 MED ORDER — FENTANYL CITRATE 0.05 MG/ML IJ SOLN: 25.0000 ug | INTRAMUSCULAR | Status: DC | PRN

## 2013-10-08 MED ORDER — ATORVASTATIN CALCIUM 80 MG PO TABS
80.0000 mg | ORAL_TABLET | Freq: Every day | ORAL | Status: DC
  Filled 2013-10-08: qty 1

## 2013-10-08 MED ORDER — ALPRAZOLAM 0.25 MG PO TABS
0.2500 mg | ORAL_TABLET | Freq: Two times a day (BID) | ORAL | Status: DC
  Administered 2013-10-08 – 2013-10-17 (×17): 0.25 mg via ORAL
  Filled 2013-10-08 (×18): qty 1

## 2013-10-08 MED ORDER — FUROSEMIDE 10 MG/ML IJ SOLN
60.0000 mg | Freq: Once | INTRAMUSCULAR | Status: AC
  Administered 2013-10-08: 60 mg via INTRAVENOUS
  Filled 2013-10-08: qty 6

## 2013-10-08 MED ORDER — FENTANYL CITRATE 0.05 MG/ML IJ SOLN
INTRAMUSCULAR | Status: DC | PRN
  Administered 2013-10-08: 125 ug via INTRAVENOUS
  Administered 2013-10-08: 25 ug via INTRAVENOUS

## 2013-10-08 MED ORDER — ONDANSETRON HCL 4 MG/2ML IJ SOLN: 4.0000 mg | Freq: Once | INTRAMUSCULAR | Status: DC | PRN

## 2013-10-08 MED ORDER — SODIUM CHLORIDE 0.9 % IV SOLN
INTRAVENOUS | Status: DC | PRN
  Administered 2013-10-08: 14:00:00 via INTRAVENOUS

## 2013-10-08 MED ORDER — CARVEDILOL 12.5 MG PO TABS
12.5000 mg | ORAL_TABLET | Freq: Two times a day (BID) | ORAL | Status: DC
  Administered 2013-10-09 – 2013-10-13 (×7): 12.5 mg via ORAL
  Filled 2013-10-08 (×11): qty 1

## 2013-10-08 MED ORDER — HYDROCODONE-ACETAMINOPHEN 10-325 MG PO TABS
1.0000 | ORAL_TABLET | ORAL | Status: DC | PRN
  Administered 2013-10-08 – 2013-10-09 (×3): 1 via ORAL
  Filled 2013-10-08 (×3): qty 1

## 2013-10-08 MED ORDER — LIDOCAINE-PRILOCAINE 2.5-2.5 % EX CREA: 1.0000 "application " | TOPICAL_CREAM | CUTANEOUS | Status: DC | PRN

## 2013-10-08 MED ORDER — DEXTROSE 5 % IV SOLN
1.5000 g | Freq: Two times a day (BID) | INTRAVENOUS | Status: AC
  Administered 2013-10-08 – 2013-10-09 (×2): 1.5 g via INTRAVENOUS
  Filled 2013-10-08 (×2): qty 1.5

## 2013-10-08 MED ORDER — ASPIRIN EC 81 MG PO TBEC
81.0000 mg | DELAYED_RELEASE_TABLET | Freq: Every evening | ORAL | Status: DC
  Filled 2013-10-08: qty 1

## 2013-10-08 MED ORDER — HEPARIN SODIUM (PORCINE) 1000 UNIT/ML DIALYSIS: 1000.0000 [IU] | INTRAMUSCULAR | Status: DC | PRN

## 2013-10-08 MED ORDER — CARVEDILOL 12.5 MG PO TABS
12.5000 mg | ORAL_TABLET | Freq: Two times a day (BID) | ORAL | Status: DC
  Administered 2013-10-08: 12.5 mg via ORAL
  Filled 2013-10-08 (×3): qty 1

## 2013-10-08 MED ORDER — POTASSIUM CHLORIDE 10 MEQ/50ML IV SOLN
10.0000 meq | Freq: Every day | INTRAVENOUS | Status: DC | PRN
  Filled 2013-10-08: qty 50

## 2013-10-08 MED ORDER — SENNOSIDES-DOCUSATE SODIUM 8.6-50 MG PO TABS
1.0000 | ORAL_TABLET | Freq: Every evening | ORAL | Status: DC | PRN
  Administered 2013-10-11: 1 via ORAL
  Filled 2013-10-08: qty 1

## 2013-10-08 MED ORDER — INSULIN ASPART 100 UNIT/ML ~~LOC~~ SOLN
0.0000 [IU] | Freq: Four times a day (QID) | SUBCUTANEOUS | Status: DC
  Administered 2013-10-08: 2 [IU] via SUBCUTANEOUS

## 2013-10-08 MED ORDER — PENTAFLUOROPROP-TETRAFLUOROETH EX AERO: 1.0000 "application " | INHALATION_SPRAY | CUTANEOUS | Status: DC | PRN

## 2013-10-08 MED ORDER — FAMOTIDINE 20 MG PO TABS
20.0000 mg | ORAL_TABLET | Freq: Every day | ORAL | Status: DC
  Administered 2013-10-09 – 2013-10-10 (×2): 20 mg via ORAL
  Filled 2013-10-08 (×4): qty 1

## 2013-10-08 MED ORDER — GLUCOSE-VITAMIN C 4-6 GM-MG PO CHEW: 4.0000 | CHEWABLE_TABLET | ORAL | Status: DC | PRN

## 2013-10-08 MED ORDER — FUROSEMIDE 10 MG/ML IJ SOLN
80.0000 mg | Freq: Every day | INTRAMUSCULAR | Status: DC
  Administered 2013-10-08: 80 mg via INTRAVENOUS
  Filled 2013-10-08: qty 8

## 2013-10-08 MED ORDER — PANTOPRAZOLE SODIUM 40 MG PO TBEC
40.0000 mg | DELAYED_RELEASE_TABLET | Freq: Every day | ORAL | Status: DC
  Administered 2013-10-09 – 2013-10-17 (×8): 40 mg via ORAL
  Filled 2013-10-08 (×8): qty 1

## 2013-10-08 MED ORDER — INSULIN ASPART 100 UNIT/ML ~~LOC~~ SOLN
0.0000 [IU] | SUBCUTANEOUS | Status: DC
  Administered 2013-10-08: 1 [IU] via SUBCUTANEOUS

## 2013-10-08 MED ORDER — SODIUM CHLORIDE 0.9 % IJ SOLN
3.0000 mL | Freq: Two times a day (BID) | INTRAMUSCULAR | Status: DC
  Administered 2013-10-08 – 2013-10-15 (×14): 3 mL via INTRAVENOUS

## 2013-10-08 MED ORDER — PREDNISONE 5 MG PO TABS
5.0000 mg | ORAL_TABLET | Freq: Two times a day (BID) | ORAL | Status: DC
  Administered 2013-10-08: 5 mg via ORAL
  Filled 2013-10-08 (×3): qty 1

## 2013-10-08 MED ORDER — HYDROCORTISONE SOD SUCCINATE 100 MG IJ SOLR
50.0000 mg | Freq: Four times a day (QID) | INTRAMUSCULAR | Status: DC
  Administered 2013-10-08 – 2013-10-10 (×7): 50 mg via INTRAVENOUS
  Filled 2013-10-08 (×10): qty 1

## 2013-10-08 MED ORDER — BISACODYL 5 MG PO TBEC
10.0000 mg | DELAYED_RELEASE_TABLET | Freq: Every day | ORAL | Status: DC
  Administered 2013-10-09 – 2013-10-17 (×5): 10 mg via ORAL
  Filled 2013-10-08 (×9): qty 2

## 2013-10-08 MED ORDER — DEXTROSE 50 % IV SOLN: 50.0000 mL | Freq: Once | INTRAVENOUS | Status: AC | PRN

## 2013-10-08 MED ORDER — ENOXAPARIN SODIUM 40 MG/0.4ML ~~LOC~~ SOLN
40.0000 mg | SUBCUTANEOUS | Status: DC
  Filled 2013-10-08: qty 0.4

## 2013-10-08 MED ORDER — SUCCINYLCHOLINE CHLORIDE 20 MG/ML IJ SOLN
INTRAMUSCULAR | Status: DC | PRN
  Administered 2013-10-08: 100 mg via INTRAVENOUS

## 2013-10-08 MED ORDER — ALTEPLASE 2 MG IJ SOLR: 2.0000 mg | Freq: Once | INTRAMUSCULAR | Status: AC | PRN

## 2013-10-08 MED ORDER — 0.9 % SODIUM CHLORIDE (POUR BTL) OPTIME
TOPICAL | Status: DC | PRN
  Administered 2013-10-08: 1000 mL

## 2013-10-08 MED ORDER — TOBRAMYCIN 0.3 % OP SOLN: 1.0000 [drp] | OPHTHALMIC | Status: DC | PRN

## 2013-10-08 MED ORDER — AMLODIPINE BESYLATE 5 MG PO TABS
5.0000 mg | ORAL_TABLET | Freq: Two times a day (BID) | ORAL | Status: DC
  Filled 2013-10-08 (×2): qty 1

## 2013-10-08 MED ORDER — MYCOPHENOLATE MOFETIL 250 MG PO CAPS
500.0000 mg | ORAL_CAPSULE | Freq: Two times a day (BID) | ORAL | Status: DC
  Administered 2013-10-08 – 2013-10-15 (×17): 500 mg via ORAL
  Filled 2013-10-08 (×22): qty 2

## 2013-10-08 MED ORDER — ONDANSETRON HCL 4 MG/2ML IJ SOLN
INTRAMUSCULAR | Status: DC | PRN
  Administered 2013-10-08: 4 mg via INTRAVENOUS

## 2013-10-08 MED ORDER — ACETAMINOPHEN 160 MG/5ML PO SOLN: 1000.0000 mg | Freq: Four times a day (QID) | ORAL | Status: DC

## 2013-10-08 MED ORDER — ONDANSETRON HCL 4 MG/2ML IJ SOLN
4.0000 mg | Freq: Four times a day (QID) | INTRAMUSCULAR | Status: DC | PRN
  Administered 2013-10-14 – 2013-10-17 (×6): 4 mg via INTRAVENOUS
  Filled 2013-10-08 (×5): qty 2

## 2013-10-08 MED ORDER — AMLODIPINE BESYLATE 5 MG PO TABS
5.0000 mg | ORAL_TABLET | Freq: Two times a day (BID) | ORAL | Status: DC
  Administered 2013-10-08 – 2013-10-13 (×6): 5 mg via ORAL
  Filled 2013-10-08 (×11): qty 1

## 2013-10-08 MED ORDER — ACETAMINOPHEN 325 MG PO TABS: 650.0000 mg | ORAL_TABLET | ORAL | Status: DC | PRN

## 2013-10-08 MED ORDER — DEXTROSE 50 % IV SOLN: 25.0000 mL | Freq: Once | INTRAVENOUS | Status: AC | PRN

## 2013-10-08 MED ORDER — SODIUM CHLORIDE 0.9 % IV SOLN
250.0000 mL | INTRAVENOUS | Status: DC | PRN
  Administered 2013-10-08: 250 mL via INTRAVENOUS

## 2013-10-08 MED ORDER — SODIUM CHLORIDE 0.9 % IJ SOLN: 3.0000 mL | INTRAMUSCULAR | Status: DC | PRN

## 2013-10-08 MED ORDER — PRIMIDONE 50 MG PO TABS
50.0000 mg | ORAL_TABLET | Freq: Every day | ORAL | Status: DC
  Filled 2013-10-08: qty 1

## 2013-10-08 MED ORDER — DEXTROSE 5 % IV SOLN
1.5000 g | INTRAVENOUS | Status: AC
  Administered 2013-10-08: 1.5 g via INTRAVENOUS
  Filled 2013-10-08: qty 1.5

## 2013-10-08 MED ORDER — PROPOFOL 10 MG/ML IV BOLUS
INTRAVENOUS | Status: DC | PRN
  Administered 2013-10-08: 60 mg via INTRAVENOUS

## 2013-10-08 MED ORDER — GLUCOSE 40 % PO GEL: 1.0000 | ORAL | Status: DC | PRN

## 2013-10-08 MED ORDER — GLYCOPYRROLATE 0.2 MG/ML IJ SOLN
INTRAMUSCULAR | Status: DC | PRN
  Administered 2013-10-08: .6 mg via INTRAVENOUS

## 2013-10-08 MED ORDER — ROCURONIUM BROMIDE 100 MG/10ML IV SOLN
INTRAVENOUS | Status: DC | PRN
  Administered 2013-10-08: 15 mg via INTRAVENOUS

## 2013-10-08 MED ORDER — NEOSTIGMINE METHYLSULFATE 1 MG/ML IJ SOLN
INTRAMUSCULAR | Status: DC | PRN
  Administered 2013-10-08: 4 mg via INTRAVENOUS

## 2013-10-08 MED ORDER — ACETAMINOPHEN 500 MG PO TABS
1000.0000 mg | ORAL_TABLET | Freq: Four times a day (QID) | ORAL | Status: DC
  Administered 2013-10-08: 1000 mg via ORAL
  Filled 2013-10-08 (×2): qty 2

## 2013-10-08 SURGICAL SUPPLY — 47 items
BENZOIN TINCTURE PRP APPL 2/3 (GAUZE/BANDAGES/DRESSINGS) IMPLANT
CANISTER SUCTION 2500CC (MISCELLANEOUS) ×2 IMPLANT
CATH THORACIC 28FR (CATHETERS) IMPLANT
CATH THORACIC 28FR RT ANG (CATHETERS) IMPLANT
CATH THORACIC 36FR (CATHETERS) IMPLANT
CATH THORACIC 36FR RT ANG (CATHETERS) IMPLANT
CONN ST 1/4X3/8  BEN (MISCELLANEOUS) ×2
CONN ST 1/4X3/8 BEN (MISCELLANEOUS) ×2 IMPLANT
CONT SPEC 4OZ CLIKSEAL STRL BL (MISCELLANEOUS) IMPLANT
COVER SURGICAL LIGHT HANDLE (MISCELLANEOUS) ×2 IMPLANT
DERMABOND ADVANCED (GAUZE/BANDAGES/DRESSINGS) ×1
DERMABOND ADVANCED .7 DNX12 (GAUZE/BANDAGES/DRESSINGS) ×1 IMPLANT
DRAIN CHANNEL 28F RND 3/8 FF (WOUND CARE) ×2 IMPLANT
DRAPE LAPAROSCOPIC ABDOMINAL (DRAPES) ×2 IMPLANT
ELECT REM PT RETURN 9FT ADLT (ELECTROSURGICAL) ×2
ELECTRODE REM PT RTRN 9FT ADLT (ELECTROSURGICAL) ×1 IMPLANT
GLOVE BIO SURGEON STRL SZ 6 (GLOVE) ×4 IMPLANT
GLOVE BIO SURGEON STRL SZ 6.5 (GLOVE) ×6 IMPLANT
GOWN STRL NON-REIN LRG LVL3 (GOWN DISPOSABLE) ×6 IMPLANT
HEMOSTAT POWDER SURGIFOAM 1G (HEMOSTASIS) IMPLANT
KIT BASIN OR (CUSTOM PROCEDURE TRAY) ×2 IMPLANT
KIT ROOM TURNOVER OR (KITS) ×2 IMPLANT
KIT SUCTION CATH 14FR (SUCTIONS) ×2 IMPLANT
NS IRRIG 1000ML POUR BTL (IV SOLUTION) ×4 IMPLANT
PACK CHEST (CUSTOM PROCEDURE TRAY) ×2 IMPLANT
PAD ARMBOARD 7.5X6 YLW CONV (MISCELLANEOUS) ×4 IMPLANT
PAD ELECT DEFIB RADIOL ZOLL (MISCELLANEOUS) ×2 IMPLANT
SPONGE GAUZE 4X4 12PLY (GAUZE/BANDAGES/DRESSINGS) ×2 IMPLANT
STRIP CLOSURE SKIN 1/2X4 (GAUZE/BANDAGES/DRESSINGS) ×2 IMPLANT
SUT SILK  1 MH (SUTURE) ×2
SUT SILK 1 MH (SUTURE) ×2 IMPLANT
SUT VIC AB 0 CTX 18 (SUTURE) ×2 IMPLANT
SUT VIC AB 1 CTX 18 (SUTURE) ×2 IMPLANT
SUT VIC AB 2-0 CT1 27 (SUTURE) ×1
SUT VIC AB 2-0 CT1 TAPERPNT 27 (SUTURE) ×1 IMPLANT
SUT VIC AB 2-0 CTX 27 (SUTURE) ×2 IMPLANT
SUT VIC AB 3-0 X1 27 (SUTURE) ×4 IMPLANT
SWAB COLLECTION DEVICE MRSA (MISCELLANEOUS) IMPLANT
SYR 50ML SLIP (SYRINGE) IMPLANT
SYRINGE 10CC LL (SYRINGE) IMPLANT
SYSTEM SAHARA CHEST DRAIN ATS (WOUND CARE) ×2 IMPLANT
TOWEL OR 17X24 6PK STRL BLUE (TOWEL DISPOSABLE) ×2 IMPLANT
TOWEL OR 17X26 10 PK STRL BLUE (TOWEL DISPOSABLE) ×2 IMPLANT
TRAP SPECIMEN MUCOUS 40CC (MISCELLANEOUS) ×4 IMPLANT
TRAY FOLEY CATH 14FRSI W/METER (CATHETERS) ×2 IMPLANT
TUBE ANAEROBIC SPECIMEN COL (MISCELLANEOUS) IMPLANT
WATER STERILE IRR 1000ML POUR (IV SOLUTION) ×2 IMPLANT

## 2013-10-08 NOTE — Consult Note (Signed)
[address]       [place] [address]             [phone]        [name] [facility] Medical Record #[id number] Date of Birth: [birth year]  Referring: Dr Tenny Craw Primary Care: [name], MD  Chief Complaint:    Chief Complaint  Patient presents with  . Altered Mental Status  . Leg Pain    History of Present Illness:     pateint now 40 years after ranl transplant with failing kidney, orthopedic /back problems with known pericardial effusion since at least 03/2014. Now admitted with hypoglycemia and increasing sob. Effusion remains large on echo   Current Activity/ Functional Status: Patient is not independent with mobility/ambulation, transfers, ADL's, IADL's.   Zubrod Score: At the time of surgery this patient's most appropriate activity status/level should be described as: []  Normal activity, no symptoms []  Symptoms, fully ambulatory []  Symptoms, in bed less than or equal to 50% of the time [x]  Symptoms, in bed greater than 50% of the time but less than 100% []  Bedridden []  Moribund  Past Medical History  Diagnosis Date  . Myocardial infarction 04/12/1990  . Systolic congestive heart failure 01/2012    a. EF: 40-45%  w/ inf/infsept and post hypokinesis (ECHO 10/08/13)  . Hypertension   . Diabetes mellitus   . ICD (implantable cardiac defibrillator) in place   . Pacemaker 02/15/2012    [employer]  . Pneumonia 2011  . Arthritis   . History of blood transfusion   . Chronic kidney disease     Glomerulonephritis  . Dysrhythmia   . Cancer     Bladder Cancer- 2004 .  SkinCancer- Basal, Squamous, and a few melymona  . Right foot drop   . Cellulitis   . Pericardial effusion     a. 39 mm in maximal dimension     Past Surgical History  Procedure Laterality Date  . Kidney transplant  02/13/1973  . Coronary angioplasty  03/1990  . Skin cancer excision      Numerous  . Eye surgery      Cataract Right Eye  . Insert / replace / remove  pacemaker    . Uretheral transplation      Left to Right (transplanted kidney)  . Skin transplant      Left thigh to Left hand  . Knee arthroscopy  07/25/2012    Procedure: ARTHROSCOPY KNEE;  Surgeon: Cammy Copa, MD;  Location: [facility] OR;  Service: Orthopedics;  Laterality: Left;  Left knee arthroscopy, debridement, cultures  . Lumbar laminectomy/decompression microdiscectomy Bilateral 03/26/2013    Procedure: LUMBAR LAMINECTOMY/DECOMPRESSION MICRODISCECTOMY 1 LEVEL;  Surgeon: Reinaldo Meeker, MD;  Location: [facility];  Service: Neurosurgery;  Laterality: Bilateral;  . Lumbar laminectomy/decompression microdiscectomy N/A 04/19/2013    Procedure: Redo Lumbar Three to Four Decompression One LEVEL;  Surgeon: Cristi Loron, MD;  Location: [facility];  Service: Neurosurgery;  Laterality: N/A;  Redo Lumbar Three to Four Decompression One LEVEL    History  Smoking status  . Former Smoker -- 30 years  . Quit date: 02/14/1983  Smokeless tobacco  . Never Used    History  Alcohol Use No    History   Social History  . Marital Status: Married    Spouse Name: N/A    Number of Children: N/A  . Years of Education: N/A   Occupational History  . Not on file.  Social History Main Topics  . Smoking status: Former Smoker -- 30 years    Quit date: 02/14/1983  . Smokeless tobacco: Never Used  . Alcohol Use: No  . Drug Use: No  . Sexual Activity: Not on file   Other Topics Concern  . Not on file   Social History Narrative  . No narrative on file    No Known Allergies  Current Facility-Administered Medications  Medication Dose Route Frequency Provider Last Rate Last Dose  . 0.9 %  sodium chloride infusion  250 mL Intravenous PRN Therisa Doyne, MD      . acetaminophen (TYLENOL) tablet 650 mg  650 mg Oral Q4H PRN Therisa Doyne, MD      . albuterol (PROVENTIL) (5 MG/ML) 0.5% nebulizer solution 2.5 mg  2.5 mg Nebulization Q2H PRN Therisa Doyne, MD   2.5 mg at  10/08/13 0046  . ALPRAZolam (XANAX) tablet 0.25 mg  0.25 mg Oral BID Therisa Doyne, MD      . amLODipine (NORVASC) tablet 5 mg  5 mg Oral BID Therisa Doyne, MD      . aspirin EC tablet 81 mg  81 mg Oral QPM Therisa Doyne, MD      . atorvastatin (LIPITOR) tablet 80 mg  80 mg Oral Daily Therisa Doyne, MD      . carvedilol (COREG) tablet 12.5 mg  12.5 mg Oral BID WC Therisa Doyne, MD   12.5 mg at 10/08/13 0900  . dextrose (GLUTOSE) 40 % oral gel 37.5 g  1 Tube Oral PRN Therisa Doyne, MD      . dextrose 50 % solution 25 mL  25 mL Intravenous Once PRN Therisa Doyne, MD      . dextrose 50 % solution 50 mL  50 mL Intravenous Once PRN Therisa Doyne, MD      . famotidine (PEPCID) tablet 20 mg  20 mg Oral Daily Therisa Doyne, MD      . ferrous sulfate tablet 325 mg  325 mg Oral BID WC Therisa Doyne, MD   325 mg at 10/08/13 0900  . furosemide (LASIX) injection 80 mg  80 mg Intravenous Daily Costin Otelia Sergeant, MD      . gabapentin (NEURONTIN) capsule 300 mg  300 mg Oral BID Therisa Doyne, MD      . glucose-Vitamin C 4-0.006 GM per chewable tablet 4 tablet  4 tablet Oral PRN Therisa Doyne, MD      . HYDROcodone-acetaminophen (NORCO) 10-325 MG per tablet 1 tablet  1 tablet Oral Q4H PRN Therisa Doyne, MD   1 tablet at 10/08/13 0323  . insulin aspart (novoLOG) injection 0-9 Units  0-9 Units Subcutaneous Q4H Anastassia Doutova, MD      . ipratropium (ATROVENT) nebulizer solution 0.5 mg  0.5 mg Nebulization Q6H Therisa Doyne, MD   0.5 mg at 10/08/13 0900  . mycophenolate (CELLCEPT) capsule 500 mg  500 mg Oral BID Therisa Doyne, MD   500 mg at 10/08/13 0900  . ondansetron (ZOFRAN) injection 4 mg  4 mg Intravenous Q6H PRN Therisa Doyne, MD      . predniSONE (DELTASONE) tablet 5 mg  5 mg Oral BID WC Therisa Doyne, MD   5 mg at 10/08/13 0900  . primidone (MYSOLINE) tablet 50 mg  50 mg Oral QHS Anastassia Doutova, MD      . sodium  chloride 0.9 % injection 3 mL  3 mL Intravenous Q12H Therisa Doyne, MD   3 mL at 10/08/13 0300  . sodium chloride  0.9 % injection 3 mL  3 mL Intravenous PRN Therisa Doyne, MD      . tobramycin (TOBREX) 0.3 % ophthalmic solution 1 drop  1 drop Both Eyes PRN Therisa Doyne, MD      . white petrolatum (VASELINE) gel             Prescriptions prior to admission  Medication Sig Dispense Refill  . ALPRAZolam (XANAX) 0.25 MG tablet Take 0.25 mg by mouth 2 (two) times daily.       Marland Kitchen amLODipine (NORVASC) 5 MG tablet Take 5 mg by mouth 2 (two) times daily.      Marland Kitchen aspirin EC 81 MG tablet Take 81 mg by mouth every evening.       Marland Kitchen atorvastatin (LIPITOR) 80 MG tablet Take 80 mg by mouth daily.      . carvedilol (COREG) 12.5 MG tablet Take 12.5 mg by mouth 2 (two) times daily with a meal.      . ferrous sulfate 325 (65 FE) MG EC tablet Take 1 tablet (325 mg total) by mouth 2 (two) times daily with a meal.  60 tablet  0  . fish oil-omega-3 fatty acids 1000 MG capsule Take 1 g by mouth daily.      . furosemide (LASIX) 40 MG tablet Take 2 tablets (80 mg total) by mouth daily as needed (if weight is above 173 lbs).  60 tablet  1  . gabapentin (NEURONTIN) 300 MG capsule Take 300 mg by mouth 2 (two) times daily.      Marland Kitchen HYDROcodone-acetaminophen (NORCO) 10-325 MG per tablet Take 1 tablet by mouth every 4 (four) hours as needed for moderate pain.       Marland Kitchen insulin aspart (NOVOLOG) 100 UNIT/ML injection Inject 6 Units into the skin 3 (three) times daily before meals.      . Insulin Detemir (LEVEMIR FLEXPEN) 100 UNIT/ML SOPN Inject 40 Units into the skin every morning.       . Multiple Vitamin (MULTIVITAMIN WITH MINERALS) TABS Take 1 tablet by mouth daily.      . mycophenolate (CELLCEPT) 500 MG tablet Take 500 mg by mouth 2 (two) times daily.       . nitroGLYCERIN (NITROSTAT) 0.4 MG SL tablet Place 0.4 mg under the tongue every 5 (five) minutes as needed for chest pain. x3 doses as needed for chest pain       . predniSONE (DELTASONE) 5 MG tablet Take 5 mg by mouth 2 (two) times daily.      . primidone (MYSOLINE) 50 MG tablet Take 50 mg by mouth at bedtime.       . ranitidine (ZANTAC) 300 MG tablet Take 300 mg by mouth at bedtime.       Marland Kitchen tobramycin (TOBREX) 0.3 % ophthalmic solution Place 1 drop into both eyes as needed (for allergies).      . vitamin C (ASCORBIC ACID) 500 MG tablet Take 500 mg by mouth daily.        Family History  Problem Relation Age of Onset  . Diabetes Mellitus II Mother   . Lung cancer Father      Review of Systems:     Cardiac Review of Systems: Y or N  Chest Pain [ n   ]  Resting SOB [ y  ] Exertional SOB  Cove.Etienne  ]  Orthopnea Cove.Etienne  ]   Pedal Edema [ y  ]    Palpitations [ y ] Syncope  [ n ]  Presyncope [ nn  ]  General Review of Systems: [Y] = yes [  ]=no Constitional: recent weight change [  ]; anorexia [  ]; fatigue [  ]; nausea [  ]; night sweats [  ]; fever [  ]; or chills [  ]                                                               Dental: poor dentition[  ]; Last Dentist visit:   Eye : blurred vision [  ]; diplopia [   ]; vision changes [  ];  Amaurosis fugax[  ]; Resp: cough [  ];  wheezing[  ];  hemoptysis[n  ]; shortness of breath[ y ]; paroxysmal nocturnal dyspnea[y  ]; dyspnea on exertion[  ]; or orthopnea[  ];  GI:  gallstones[  ], vomiting[  ];  dysphagia[  ]; melena[  ];  hematochezia [  ]; heartburn[  ];   Hx of  Colonoscopy[  ]; GU: kidney stones [  ]; hematuria[  ];   dysuria [  ];  nocturia[  ];  history of     obstruction [  ]; urinary frequency [  ]             Skin: rash, swelling[  ];, hair loss[  ];  peripheral edema[  ];  or itching[  ]; Musculosketetal: myalgias[  ];  joint swelling[  ];  joint erythema[  ];  joint pain[  ];  back pain[  ];  Heme/Lymph: bruising[  ];  bleeding[  ];  anemia[  ];  Neuro: TIA[  ];  headaches[  ];  stroke[  ];  vertigo[  ];  seizures[  ];   paresthesias[  ];  difficulty walking[  ];  Psych:depression[  ];  anxiety[  ];  Endocrine: diabetes[  ];  thyroid dysfunction[  ];  Immunizations: Flu [ y ]; Pneumococcal[y  ];  Other:  Physical Exam: BP 136/70  Pulse 74  Temp(Src) 98.9 F (37.2 C) (Oral)  Resp 25  Ht 6\' 2"  (1.88 m)  Wt 198 lb 6.6 oz (90 kg)  BMI 25.46 kg/m2  SpO2 100%  General appearance: alert, cooperative, appears older than stated age and mild distress Neurologic: intact Heart: regular rate and rhythm, no click and no rub Lungs: diminished breath sounds bibasilar Abdomen: soft, non-tender; bowel sounds normal; no masses,  no organomegaly Extremities: extremities normal, atraumatic, no cyanosis , edema bilateral ankle and Homans sign is negative, no sign of DVT Marked jvd sitting up   Diagnostic Studies & Laboratory data:     Recent Radiology Findings:   Dg Chest 2 View  10/07/2013   CLINICAL DATA:  Altered mental status  EXAM: CHEST  2 VIEW  COMPARISON:  04/11/2013  FINDINGS: Severe cardiac enlargement. This is stable. Pacer is unchanged. Lungs are clear.  IMPRESSION: No active cardiopulmonary disease. Stable severe cardiac enlargement.   Electronically Signed   By: Esperanza Heir M.D.   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  The goals risks and alternatives of the planned surgical procedure drainage of pericardium and pericarial  windowhave been discussed with the patient in detail. The risks of the procedure including death, infection, stroke, myocardial infarction, bleeding, blood transfusion have all been discussed specifically.  I have quoted Burman Blacksmith a 10 % of perioperative mortality and a complication rate as high as 25%. The patient's questions have been answered.TERI DILTZ is willing  to proceed with the planned procedure.    history of Myocardial infarction (04/12/1990); Systolic congestive heart failure (01/2012); Hypertension; Diabetes mellitus; ICD (implantable cardiac defibrillator) in place; Pacemaker (02/15/2012); Pneumonia (2011); Arthritis; History of blood transfusion; Chronic kidney disease; Dysrhythmia; Cancer; Right foot drop; Cellulitis; and Pericardial effusion.     Delight Ovens MD      [address] [place].[address] [place] [address] Office [phone]   Beeper [phone]  10/08/2013 11:27 AM

## 2013-10-08 NOTE — Transfer of Care (Signed)
Immediate Anesthesia Transfer of Care Note  Patient: [name]  Procedure(s) Performed: Procedure(s): SUBXYPHOID PERICARDIAL WINDOW (N/A)  Patient Location: PACU  Anesthesia Type:General  Level of Consciousness: awake and alert   Airway & Oxygen Therapy: Patient Spontanous Breathing and Patient connected to nasal cannula oxygen  Post-op Assessment: Report given to PACU RN and Post -op Vital signs reviewed and stable  Post vital signs: Reviewed and stable  Complications: No apparent anesthesia complications

## 2013-10-08 NOTE — Anesthesia Preprocedure Evaluation (Addendum)
Anesthesia Evaluation  Patient identified by MRN, date of birth, ID band Patient awake    Reviewed: Allergy & Precautions, H&P , NPO status , Patient's Chart, lab work & pertinent test results, reviewed documented beta blocker date and time   Airway Mallampati: III TM Distance: >3 FB Neck ROM: Full    Dental  (+) Dental Advisory Given and Teeth Intact   Pulmonary former smoker,  breath sounds clear to auscultation        Cardiovascular hypertension, Pt. on medications and Pt. on home beta blockers + CAD, + Past MI, + Cardiac Stents and +CHF + dysrhythmias ([employer] AICD in place.) + pacemaker + Cardiac Defibrillator Rhythm:Regular Rate:Normal  2D Echo 10/08/13:   Study Conclusions  - Left ventricle: LVEF is approximately 40 to 45% with   inferior/inferoseptal and posterior hypokinesis. The   cavity size was normal. Wall thickness was increased in a pattern of severe LVH. - Right ventricle: Systolic function was moderately reduced. - Pulmonary arteries: PA peak pressure: 52mm Hg (S). - Pericardium, extracardiac: Large pericardial effusion   surrounds heart. Measures 39 mm in maximal dimension. Larger than on echo in July 2014, There is no mitral inflow variation There is no RV collapse The IVC is dilated with no respiratory variation. Does not meet full echo criteria for tamponade. Discussed with Dr. Lafe Garin for clinical correlation.    Neuro/Psych    GI/Hepatic GERD-  Medicated,  Endo/Other  diabetes, Insulin Dependent  Renal/GU CRFRenal disease     Musculoskeletal  (+) Arthritis -,   Abdominal   Peds  Hematology  (+) anemia ,   Anesthesia Other Findings   Reproductive/Obstetrics                      Anesthesia Physical Anesthesia Plan  ASA: IV  Anesthesia Plan: General   Post-op Pain Management:    Induction: Intravenous  Airway Management Planned: Oral ETT and Video  Laryngoscope Planned  Additional Equipment: Arterial line and CVP  Intra-op Plan:   Post-operative Plan: Extubation in OR and Possible Post-op intubation/ventilation  Informed Consent: I have reviewed the patients History and Physical, chart, labs and discussed the procedure including the risks, benefits and alternatives for the proposed anesthesia with the patient or authorized representative who has indicated his/her understanding and acceptance.   Dental advisory given  Plan Discussed with: CRNA, Anesthesiologist and Surgeon  Anesthesia Plan Comments:        Anesthesia Quick Evaluation

## 2013-10-08 NOTE — Progress Notes (Signed)
CT surgery p.m. Rounds  Patient resting comfortably after subxiphoid pericardial window  Rhythm and hemodynamics are stable Minimal drainage from chest tube the last few hours

## 2013-10-08 NOTE — Progress Notes (Signed)
PHARMACIST - PHYSICIAN COMMUNICATION DR:     DESCRIPTION: 67yo male has home med of Lipitor 80mg  BID reordered as inpatient; this is higher than the max dose and on most recent admission (Oct 2014) pt was only taking 40mg  daily.  Lipid panel not available in our Epic system.  I have changed this to Lipitor 80mg  daily for now; please address if further cholesterol management is required (eg, fibrate, sequestrant, ezetimibe, etc).  Pharmacists will be glad to assist if needed.   Vernard Gambles, PharmD, BCPS 10/08/2013 3:08 AM

## 2013-10-08 NOTE — Progress Notes (Signed)
  Echocardiogram 2D Echocardiogram has been performed.  [name] 10/08/2013, 8:42 AM

## 2013-10-08 NOTE — Progress Notes (Signed)
*  PRELIMINARY RESULTS* Echocardiogram Echocardiogram Transesophageal has been performed.  [name] 10/08/2013, 3:27 PM

## 2013-10-08 NOTE — Progress Notes (Signed)
[employer] rep has been called to interrogate AICD, per Dr. Ivin Booty

## 2013-10-08 NOTE — Progress Notes (Signed)
Care of pt assumed by MA Jahki Witham RN from J. Hart RN 

## 2013-10-08 NOTE — Consult Note (Signed)
WOC wound consult note Reason for Consult: evaluation of multiple wounds. Pt followed by Dr. Adolphus Birchwood dermatologist in [place].  He has a very significant history of basal cell skin cancers removal and currently has one on the left forearm that is pending removal.  He has had MOHS procedures in the past and has chronic skin changes over his entire body.  He reports that he had skin cancer removed several months ago on the bilateral LE, he continues to have a non healing ulcer at this same site on the LLE.  He has had a skin cancer removed on the right tricep area that he reports had stitches removed over a week ago and he has the same dressing still in place from this.  He has a new area of trauma to the right lateral LE and with his transplant history and other comorbid conditions the patient has limited ability to heal wounds. Wound type: All of his wounds are related to skin cancers except the right lateral leg (trauma) Measurement: R lateral leg: 7cm x 2cm x 0 Left pretibial: 2cm x 3cm x 0.5cm  Right triceps region: 5cm x 4cm x 0.2cm  Left forearm: 3cm x3cm raised open skin cancer Wound bed: R lateral leg maroon darkened tissue from trauma to this skin, not currently open a tiny bit fluctuant. Left pretibial: 75% pink, with 25% yellow fibrin Right triceps: 100% yellow, partial thickness skin loss from surgical removal of skin cancer Left forearm: raised, friable, skin cancer   Drainage (amount, consistency, odor) quite a bit of oozing of blood from the right posterior arm related to dressing removal and that this dressing had been in place for a while and smelled of dried blood.  The right lateral leg has no drainage, the left pretibial wound has minimal serosanguinous drainage with a slight odor but no s/s of acute infection.  Periwound: multiple areas of skin hyperkeratosis and other dark lesions over the entire body.   Dressing procedure/placement/frequency: Will use xeroform gauze to cover  the skin cancer on the left forearm, the surgical sites on the right posterior arm, and the area of trauma on the right lower leg to protect and utilize the bismuth as antibacterial and non adherent.  Will add silver gel for the LLE wound for moist wound healing and to treat the presence of bioburdan in this chronic non healing ulcer.  Dressing changes for the left LE wound daily per the bedside nursing staff, silver gel labeled and placed at the bedside for use. Dressing changes weekly and PRN for the UE wounds and the right LE.   Follow up with dermatologist for long  term wound care and skin cancer tx.  Discussed POC with patient and bedside nurse.  Re consult if needed, will not follow at this time. Thanks  Fredick Schlosser [employer], CWOCN [phone])

## 2013-10-08 NOTE — Consult Note (Signed)
Reason for Consult: Acute renal failure on chronic kidney disease stage IV-T Referring Physician: Cecile Hearing MD ([facility])  HPI: 67 year old Caucasian man with past medical history significant for end-stage renal disease status post kidney transplant 40 years ago and had been doing relatively well (followup at [facility] [place]) up until about 5 months ago after which he has had progressive decline of his renal allograft function. He has multiple medical problems including coronary artery disease status post PTCA, CHF (EF 40-45%), hypertension, insulin-dependent diabetes and a history of basal cell cancer forcing conversion of immunosuppression from azathioprine to CellCept. He presented to the hospital yesterday with progressively worsening shortness of breath. He also reports diminished urine output and was concerned that his allograft was failing. He had been decreasing his sodium intake as well as his fluid intake as recommended to him to avoid volume overload. He denied any vomiting or diarrhea but reported occasional nausea. Denied any wheezing or sputum production. Denied any fevers or chills. In the emergency room, found to have an elevated creatinine/BUN (3.3 and 112 respectively). On his conversation at this time, in spite of my earlier introduction to him-he confuses me for his neurosurgeon.  Past Medical History  Diagnosis Date  . CAD (coronary artery disease)     a. 03/1990 - s/p MI and PTCA  . Systolic congestive heart failure 01/2012    a. 09/2013 Echo: EF 40-45%, inf/infsept & post HK, sev LVH, mod reduced RV fxn, PASP , large pericardial eff - 39 mm in maximal dimension, no RV collapse, dilated IVC w/o resp variation.  . Hypertension   . Diabetes mellitus   . ICD (implantable cardiac defibrillator) in place     a. BSX 02/2012, in setting of cardiac arrest.  . History of pneumonia 2011  . Arthritis   . History of blood transfusion   . CKD (chronic kidney  disease), stage IV     a. 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 Cancer     a. Bladder Cancer- 2004;  b. Skin Cancer- Basal, Squamous, and a few melanoma  . Right foot drop   . Cellulitis   . Pericardial effusion     a. 09/2013 Echo: large pericardial eff - 39 mm in maximal dimension, no RV collapse, dilated IVC w/o resp variation.    Past Surgical History  Procedure Laterality Date  . Kidney transplant  02/13/1973  . Coronary angioplasty  03/1990  . Skin cancer excision      Numerous  . Eye surgery      Cataract Right Eye  . Insert / replace / remove pacemaker    . Uretheral transplation      Left to Right (transplanted kidney)  . Skin transplant      Left thigh to Left hand  . Knee arthroscopy  07/25/2012    Procedure: ARTHROSCOPY KNEE;  Surgeon: Cammy Copa, MD;  Location: [facility];  Service: Orthopedics;  Laterality: Left;  Left knee arthroscopy, debridement, cultures  . Lumbar laminectomy/decompression microdiscectomy Bilateral 03/26/2013    Procedure: LUMBAR LAMINECTOMY/DECOMPRESSION MICRODISCECTOMY 1 LEVEL;  Surgeon: Reinaldo Meeker, MD;  Location: [facility] NEURO ORS;  Service: Neurosurgery;  Laterality: Bilateral;  . Lumbar laminectomy/decompression microdiscectomy N/A 04/19/2013    Procedure: Redo Lumbar Three to Four Decompression One LEVEL;  Surgeon: Cristi Loron, MD;  Location: [facility] NEUR[facility] ORS;  Service: Neurosurgery;  Laterality: N/A;  Redo Lumbar Three to Four Decompression One LEVEL    Family History  Problem  Relation Age of Onset  . Diabetes Mellitus II Mother   . Lung cancer Father     Social History:  reports that he quit smoking about 30 years ago. He has never used smokeless tobacco. He reports that he does not drink alcohol or use illicit drugs.  Allergies: No Known Allergies  Medications:  Scheduled: . ALPRAZolam  0.25 mg Oral BID  . amLODipine  5 mg Oral BID  . aspirin EC  81 mg Oral QPM  . atorvastatin  80 mg Oral Daily  . carvedilol   12.5 mg Oral BID WC  . cefUROXime (ZINACEF)  IV  1.5 g Intravenous 60 min Pre-Op  . famotidine  20 mg Oral Daily  . ferrous sulfate  325 mg Oral BID WC  . furosemide  80 mg Intravenous Daily  . gabapentin  300 mg Oral BID  . insulin aspart  0-9 Units Subcutaneous Q4H  . ipratropium  0.5 mg Nebulization Q6H  . mycophenolate  500 mg Oral BID  . predniSONE  5 mg Oral BID WC  . primidone  50 mg Oral QHS  . sodium chloride  3 mL Intravenous Q12H    Results for orders placed during the hospital encounter of 10/07/13 (from the past 48 hour(s))  GLUCOSE, CAPILLARY     Status: None   Collection Time    10/07/13  6:28 PM      Result Value Range   Glucose-Capillary 70  70 - 99 mg/dL  COMPREHENSIVE METABOLIC PANEL     Status: Abnormal   Collection Time    10/07/13  7:02 PM      Result Value Range   Sodium 138  135 - 145 mEq/L   Potassium 4.8  3.5 - 5.1 mEq/L   Chloride 105  96 - 112 mEq/L   CO2 16 (*) 19 - 32 mEq/L   Glucose, Bld 68 (*) 70 - 99 mg/dL   BUN 829 (*) 6 - 23 mg/dL   Creatinine, Ser 5.62 (*) 0.50 - 1.35 mg/dL   Calcium 8.2 (*) 8.4 - 10.5 mg/dL   Total Protein 4.7 (*) 6.0 - 8.3 g/dL   Albumin 2.7 (*) 3.5 - 5.2 g/dL   AST 25  0 - 37 U/L   ALT 22  0 - 53 U/L   Alkaline Phosphatase 58  39 - 117 U/L   Total Bilirubin 0.3  0.3 - 1.2 mg/dL   GFR calc non Af Amer 18 (*) >90 mL/min   GFR calc Af Amer 21 (*) >90 mL/min   Comment: (NOTE)     The eGFR has been calculated using the CKD EPI equation.     This calculation has not been validated in all clinical situations.     eGFR's persistently <90 mL/min signify possible Chronic Kidney     Disease.  CBC WITH DIFFERENTIAL     Status: Abnormal   Collection Time    10/07/13  7:02 PM      Result Value Range   WBC 12.3 (*) 4.0 - 10.5 K/uL   RBC 2.59 (*) 4.22 - 5.81 MIL/uL   Hemoglobin 8.0 (*) 13.0 - 17.0 g/dL   HCT 13.0 (*) 86.5 - 78.4 %   MCV 93.1  78.0 - 100.0 fL   MCH 30.9  26.0 - 34.0 pg   MCHC 33.2  30.0 - 36.0 g/dL   RDW  69.6 (*) 29.5 - 15.5 %   Platelets 104 (*) 150 - 400 K/uL   Comment: PLATELET COUNT  CONFIRMED BY SMEAR   Neutrophils Relative % 92 (*) 43 - 77 %   Neutro Abs 11.3 (*) 1.7 - 7.7 K/uL   Lymphocytes Relative 4 (*) 12 - 46 %   Lymphs Abs 0.4 (*) 0.7 - 4.0 K/uL   Monocytes Relative 4  3 - 12 %   Monocytes Absolute 0.5  0.1 - 1.0 K/uL   Eosinophils Relative 0  0 - 5 %   Eosinophils Absolute 0.0  0.0 - 0.7 K/uL   Basophils Relative 0  0 - 1 %   Basophils Absolute 0.0  0.0 - 0.1 K/uL  GLUCOSE, CAPILLARY     Status: None   Collection Time    10/07/13  7:28 PM      Result Value Range   Glucose-Capillary 77  70 - 99 mg/dL  TROPONIN I     Status: None   Collection Time    10/07/13  8:08 PM      Result Value Range   Troponin I <0.30  <0.30 ng/mL   Comment:            Due to the release kinetics of cTnI,     a negative result within the first hours     of the onset of symptoms does not rule out     myocardial infarction with certainty.     If myocardial infarction is still suspected,     repeat the test at appropriate intervals.  GLUCOSE, CAPILLARY     Status: Abnormal   Collection Time    10/07/13  8:47 PM      Result Value Range   Glucose-Capillary 144 (*) 70 - 99 mg/dL   Comment 1 Notify RN    GLUCOSE, CAPILLARY     Status: Abnormal   Collection Time    10/07/13 10:35 PM      Result Value Range   Glucose-Capillary 136 (*) 70 - 99 mg/dL  D-DIMER, QUANTITATIVE     Status: Abnormal   Collection Time    10/08/13  1:00 AM      Result Value Range   D-Dimer, Quant 2.59 (*) 0.00 - 0.48 ug/mL-FEU   Comment:            AT THE INHOUSE ESTABLISHED CUTOFF     VALUE OF 0.48 ug/mL FEU,     THIS ASSAY HAS BEEN DOCUMENTED     IN THE LITERATURE TO HAVE     A SENSITIVITY AND NEGATIVE     PREDICTIVE VALUE OF AT LEAST     98 TO 99%.  THE TEST RESULT     SHOULD BE CORRELATED WITH     AN ASSESSMENT OF THE CLINICAL     PROBABILITY OF DVT / VTE.  GLUCOSE, CAPILLARY     Status: Abnormal    Collection Time    10/08/13  1:59 AM      Result Value Range   Glucose-Capillary 108 (*) 70 - 99 mg/dL  MRSA PCR SCREENING     Status: None   Collection Time    10/08/13  2:45 AM      Result Value Range   MRSA by PCR NEGATIVE  NEGATIVE   Comment:            The GeneXpert MRSA Assay (FDA     approved for NASAL specimens     only), is one component of a     comprehensive MRSA colonization     surveillance program. It is not  intended to diagnose MRSA     infection nor to guide or     monitor treatment for     MRSA infections.  GLUCOSE, CAPILLARY     Status: None   Collection Time    10/08/13  3:05 AM      Result Value Range   Glucose-Capillary 90  70 - 99 mg/dL   Comment 1 Notify RN    URINALYSIS, ROUTINE W REFLEX MICROSCOPIC     Status: Abnormal   Collection Time    10/08/13  3:06 AM      Result Value Range   Color, Urine YELLOW  YELLOW   APPearance CLOUDY (*) CLEAR   Specific Gravity, Urine 1.015  1.005 - 1.030   pH 5.0  5.0 - 8.0   Glucose, UA NEGATIVE  NEGATIVE mg/dL   Hgb urine dipstick NEGATIVE  NEGATIVE   Bilirubin Urine NEGATIVE  NEGATIVE   Ketones, ur NEGATIVE  NEGATIVE mg/dL   Protein, ur 161 (*) NEGATIVE mg/dL   Urobilinogen, UA 0.2  0.0 - 1.0 mg/dL   Nitrite NEGATIVE  NEGATIVE   Leukocytes, UA NEGATIVE  NEGATIVE  URINE MICROSCOPIC-ADD ON     Status: Abnormal   Collection Time    10/08/13  3:06 AM      Result Value Range   Squamous Epithelial / LPF RARE  RARE   Bacteria, UA MANY (*) RARE   Urine-Other AMORPHOUS URATES/PHOSPHATES    TROPONIN I     Status: None   Collection Time    10/08/13  4:20 AM      Result Value Range   Troponin I <0.30  <0.30 ng/mL   Comment:            Due to the release kinetics of cTnI,     a negative result within the first hours     of the onset of symptoms does not rule out     myocardial infarction with certainty.     If myocardial infarction is still suspected,     repeat the test at appropriate intervals.  CBC WITH  DIFFERENTIAL     Status: Abnormal   Collection Time    10/08/13  4:20 AM      Result Value Range   WBC 10.8 (*) 4.0 - 10.5 K/uL   RBC 2.61 (*) 4.22 - 5.81 MIL/uL   Hemoglobin 7.8 (*) 13.0 - 17.0 g/dL   HCT 09.6 (*) 04.5 - 40.9 %   MCV 95.4  78.0 - 100.0 fL   MCH 29.9  26.0 - 34.0 pg   MCHC 31.3  30.0 - 36.0 g/dL   RDW 81.1 (*) 91.4 - 78.2 %   Platelets 124 (*) 150 - 400 K/uL   Neutrophils Relative % 91 (*) 43 - 77 %   Neutro Abs 9.8 (*) 1.7 - 7.7 K/uL   Lymphocytes Relative 4 (*) 12 - 46 %   Lymphs Abs 0.5 (*) 0.7 - 4.0 K/uL   Monocytes Relative 4  3 - 12 %   Monocytes Absolute 0.4  0.1 - 1.0 K/uL   Eosinophils Relative 1  0 - 5 %   Eosinophils Absolute 0.1  0.0 - 0.7 K/uL   Basophils Relative 0  0 - 1 %   Basophils Absolute 0.0  0.0 - 0.1 K/uL  COMPREHENSIVE METABOLIC PANEL     Status: Abnormal   Collection Time    10/08/13  4:20 AM      Result Value Range   Sodium  136  135 - 145 mEq/L   Potassium 4.9  3.5 - 5.1 mEq/L   Chloride 104  96 - 112 mEq/L   CO2 17 (*) 19 - 32 mEq/L   Glucose, Bld 83  70 - 99 mg/dL   BUN 161 (*) 6 - 23 mg/dL   Creatinine, Ser 0.96 (*) 0.50 - 1.35 mg/dL   Calcium 8.3 (*) 8.4 - 10.5 mg/dL   Total Protein 5.0 (*) 6.0 - 8.3 g/dL   Albumin 2.6 (*) 3.5 - 5.2 g/dL   AST 21  0 - 37 U/L   ALT 23  0 - 53 U/L   Alkaline Phosphatase 58  39 - 117 U/L   Total Bilirubin 0.2 (*) 0.3 - 1.2 mg/dL   GFR calc non Af Amer 17 (*) >90 mL/min   GFR calc Af Amer 20 (*) >90 mL/min   Comment: (NOTE)     The eGFR has been calculated using the CKD EPI equation.     This calculation has not been validated in all clinical situations.     eGFR's persistently <90 mL/min signify possible Chronic Kidney     Disease.  HEMOGLOBIN A1C     Status: Abnormal   Collection Time    10/08/13  4:20 AM      Result Value Range   Hemoglobin A1C 6.2 (*) <5.7 %   Comment: (NOTE)                                                                               According to the ADA Clinical  Practice Recommendations for 2011, when     HbA1c is used as a screening test:      >=6.5%   Diagnostic of Diabetes Mellitus               (if abnormal result is confirmed)     5.7-6.4%   Increased risk of developing Diabetes Mellitus     References:Diagnosis and Classification of Diabetes Mellitus,Diabetes     Care,2011,34(Suppl 1):S62-S69 and Standards of Medical Care in             Diabetes - 2011,Diabetes Care,2011,34 (Suppl 1):S11-S61.   Mean Plasma Glucose 131 (*) <117 mg/dL   Comment: Performed at [facility]  GLUCOSE, CAPILLARY     Status: None   Collection Time    10/08/13  8:00 AM      Result Value Range   Glucose-Capillary 87  70 - 99 mg/dL   Comment 1 Notify RN     Comment 2 Documented in Chart    TROPONIN I     Status: None   Collection Time    10/08/13  8:25 AM      Result Value Range   Troponin I <0.30  <0.30 ng/mL   Comment:            Due to the release kinetics of cTnI,     a negative result within the first hours     of the onset of symptoms does not rule out     myocardial infarction with certainty.     If myocardial infarction is still suspected,     repeat the test at  appropriate intervals.  PRO B NATRIURETIC PEPTIDE     Status: Abnormal   Collection Time    10/08/13  8:25 AM      Result Value Range   Pro B Natriuretic peptide (BNP) 10879.0 (*) 0 - 125 pg/mL  GLUCOSE, CAPILLARY     Status: Abnormal   Collection Time    10/08/13 11:35 AM      Result Value Range   Glucose-Capillary 148 (*) 70 - 99 mg/dL   Comment 1 Notify RN     Comment 2 Documented in Chart      Dg Chest 2 View  10/07/2013   CLINICAL DATA:  Altered mental status  EXAM: CHEST  2 VIEW  COMPARISON:  04/11/2013  FINDINGS: Severe cardiac enlargement. This is stable. Pacer is unchanged. Lungs are clear.  IMPRESSION: No active cardiopulmonary disease. Stable severe cardiac enlargement.   Electronically Signed   By: Esperanza Heir M.D.   On: 10/07/2013 21:24    Review of Systems   Constitutional: Positive for malaise/fatigue. Negative for fever, chills, weight loss and diaphoresis.  HENT: Negative.   Eyes: Negative.   Respiratory: Positive for cough and shortness of breath. Negative for hemoptysis, sputum production and wheezing.   Cardiovascular: Negative.   Gastrointestinal: Positive for nausea. Negative for heartburn, vomiting, abdominal pain, diarrhea, constipation, blood in stool and melena.  Genitourinary: Negative.   Musculoskeletal: Positive for back pain and myalgias. Negative for falls, joint pain and neck pain.  Skin: Negative.   Neurological: Negative.   Endo/Heme/Allergies: Negative.   Psychiatric/Behavioral: Negative for suicidal ideas, hallucinations, memory loss and substance abuse. The patient is nervous/anxious. The patient does not have insomnia.    Blood pressure 125/58, pulse 74, temperature 98.8 F (37.1 C), temperature source Oral, resp. rate 25, height 6\' 2"  (1.88 m), weight 90 kg (198 lb 6.6 oz), SpO2 100.00%. Physical Exam  Nursing note and vitals reviewed. Constitutional: He is oriented to person, place, and time. He appears well-developed and well-nourished. No distress.  HENT:  Head: Normocephalic and atraumatic.  Nose: Nose normal.  Mouth/Throat: No oropharyngeal exudate.  Eyes: Conjunctivae and EOM are normal. Pupils are equal, round, and reactive to light. No scleral icterus.  Neck: Normal range of motion. Neck supple. JVD present. No tracheal deviation present. No thyromegaly present.  9cm JVD  Cardiovascular: Normal rate.  Exam reveals friction rub.   No murmur heard. Distant S1 and S2  Respiratory: Effort normal. No respiratory distress. He has no wheezes. He has rales. He exhibits no tenderness.  GI: Soft. Bowel sounds are normal. He exhibits no distension. There is no tenderness. There is no rebound and no guarding.  Musculoskeletal: He exhibits edema. He exhibits no tenderness.  1+ bilateral LE edema  Lymphadenopathy:     He has no cervical adenopathy.  Neurological: He is alert and oriented to person, place, and time.  Intermittently confused  Skin: Skin is warm. He is not diaphoretic.  Psychiatric: He has a normal mood and affect.    Assessment/Plan: 1. Acute renal failure on chronic kidney disease stage IV. He is scheduled for a pericardial window today given the possibility that his respiratory symptoms are associated with this. His confusion and elevated BUN point towards uremia (as does his pericardial effusion which may be uremic as well). No acute electrolyte or volume concerns for emergent dialysis. We'll consult interventional radiology to place a tunneled dialysis catheter as I currently do not expect there to be a significant renal recovery. Continue immunosuppression with  CellCept and prednisone. 2. Shortness of breath/pericardial effusion: Marginal urine output and unlikely to be responsive to diuretics. Pericardial window placement today. 3. Anemia: Likely anemia secondary to chronic kidney disease. Iron stores checked on 09/01/2013 showed iron deficiency with iron saturation of 11% and an elevated ferritin of 869. We'll recheck these today and consider ESA accordingly  intravenous iron. 4. Metabolic bone disease: Check PTH level to determine need for VDRA. 5. Nutrition: Low albumin noted and likely to be reflective of worsening chronic kidney disease. Start nutritional supplements. [name] K. 10/08/2013, 12:53 PM

## 2013-10-08 NOTE — Progress Notes (Signed)
eLink Physician-Brief Progress Note Patient Name: [name] DOB: Sep 20, 1946 MRN: [id number]  Date of Service  10/08/2013   HPI/Events of Note   Slight metabolic acidosis on ABG Arouses easily  eICU Interventions  Repeat ABG      Wylma Tatem 10/08/2013, 7:38 PM

## 2013-10-08 NOTE — Progress Notes (Signed)
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 Patient have had an MI in 2013.  He have had recent laminectomy and hip surgeries. He have had trouble recovering since. Patient have head steady decline of his renal function since June 2014. He reports having low blood sugars today after not eating lunch patient is on Levemir and regular insulin for his diabetes. Patient have been on fluid restriction given worsening renal failure. He is also on Lasix 80 mg PO qd if weight >173. He has not taken lasix today. Denies any chest pain but is visibly severly short of breath. States this has been going on for the past few hours chest x-ray did not show evidence of significant pulmonary edema but he does have evidence of peripheral swelling. NO Hx of COPD  Assessment/Plan: Acute respiratory failure with hypoxia - patient with significant JVD and history of pericardial effusion. 2D echo ordered stat this morning showed increased pericardial effusion without tamponade physiology per echo. Cardiology and thoracic surgery consulted and patient will go to have a pericardial window likely today.  Acute on chronic renal failure - nephrology consulted. Renal function worse this morning.  S/p renal transplant - continue home medications Right leg ulcer - s/p biopsy by derm in the past, now finished Doxy as an outpatient few days ago. Monitor. Appreciate wound consult.  Skin BCC Hypoglycemia - due to progressive renal failure. SSI here.  DM Anemia  Diet: NPO Fluids: none DVT Prophylaxis: SCD  Code Status: Full Family Communication: none  Disposition Plan: SDU  Consultants:  Cardiology  Nephrology  Thoracic surgery  Procedures:  2D echo Study Conclusions  - Left ventricle: LVEF is approximately 40 to 45% with inferior/inferoseptal and  posterior hypokinesis. The cavity size was normal. Wall thickness was increased in a pattern of severe LVH. - Right ventricle: Systolic function was moderately reduced. - Pulmonary arteries: PA peak pressure: 52mm Hg (S). - Pericardium, extracardiac: Large pericardial effusion surrounds heart. Measures 39 mm in maximal dimension. Larger than on echo in July 2014, There is no mitral inflow variation There is no RV collapse The IVC is dilated with no respiratory variation. Does not meet full echo criteria for tamponade.   Antibiotics  Anti-infectives   None     Antibiotics Given (last 72 hours)   None     HPI/Subjective: - continues to feel dyspneic.  Objective: Filed Vitals:   10/08/13 0258 10/08/13 0350 10/08/13 0400 10/08/13 0831  BP: 136/70     Pulse:      Temp: 97.9 F (36.6 C)   98.9 F (37.2 C)  TempSrc: Oral  Oral Oral  Resp: 25     Height: 6\' 2"  (1.88 m)     Weight: 90 kg (198 lb 6.6 oz)     SpO2: 98% 100%      Intake/Output Summary (Last 24 hours) at 10/08/13 1038 Last data filed at 10/08/13 0831  Gross per 24 hour  Intake    133 ml  Output    725 ml  Net   -592 ml   Filed Weights   10/08/13 0258  Weight: 90 kg (198 lb 6.6 oz)   Exam:  General:  NAD  Cardiovascular: regular rate and rhythm, without MRG  Respiratory: good air movement, clear to auscultation throughout, no wheezing, ronchi or rales  Abdomen: soft, not tender  to palpation, positive bowel sounds  MSK: 1+ peripheral edema. Left leg wound ~2-3 cm, good granulation tissue without surrounding cellulitis.   Neuro: non focal  Data Reviewed: Basic Metabolic Panel:  Recent Labs Lab 10/07/13 1902 10/08/13 0420  NA 138 136  K 4.8 4.9  CL 105 104  CO2 16* 17*  GLUCOSE 68* 83  BUN 112* 112*  CREATININE 3.28* 3.45*  CALCIUM 8.2* 8.3*   Liver Function Tests:  Recent Labs Lab 10/07/13 1902 10/08/13 0420  AST 25 21  ALT 22 23  ALKPHOS 58 58  BILITOT 0.3 0.2*  PROT 4.7* 5.0*   ALBUMIN 2.7* 2.6*   CBC:  Recent Labs Lab 10/07/13 1902 10/08/13 0420  WBC 12.3* 10.8*  NEUTROABS 11.3* 9.8*  HGB 8.0* 7.8*  HCT 24.1* 24.9*  MCV 93.1 95.4  PLT 104* 124*   Cardiac Enzymes:  Recent Labs Lab 10/07/13 2008 10/08/13 0420 10/08/13 0825  TROPONINI <0.30 <0.30 <0.30   BNP (last 3 results)  Recent Labs  04/09/13 1856 10/08/13 0825  PROBNP 21940.0* 10879.0*   CBG:  Recent Labs Lab 10/07/13 2047 10/07/13 2235 10/08/13 0159 10/08/13 0305 10/08/13 0800  GLUCAP 144* 136* 108* 90 87    Recent Results (from the past 240 hour(s))  MRSA PCR SCREENING     Status: None   Collection Time    10/08/13  2:45 AM      Result Value Range Status   MRSA by PCR NEGATIVE  NEGATIVE Final   Comment:            The GeneXpert MRSA Assay (FDA     approved for NASAL specimens     only), is one component of a     comprehensive MRSA colonization     surveillance program. It is not     intended to diagnose MRSA     infection nor to guide or     monitor treatment for     MRSA infections.     Studies: Dg Chest 2 View  10/07/2013   CLINICAL DATA:  Altered mental status  EXAM: CHEST  2 VIEW  COMPARISON:  04/11/2013  FINDINGS: Severe cardiac enlargement. This is stable. Pacer is unchanged. Lungs are clear.  IMPRESSION: No active cardiopulmonary disease. Stable severe cardiac enlargement.   Electronically Signed   By: Esperanza Heir M.D.   On: 10/07/2013 21:24    Scheduled Meds: . ALPRAZolam  0.25 mg Oral BID  . amLODipine  5 mg Oral BID  . aspirin EC  81 mg Oral QPM  . atorvastatin  80 mg Oral Daily  . carvedilol  12.5 mg Oral BID WC  . enoxaparin (LOVENOX) injection  40 mg Subcutaneous Q24H  . famotidine  20 mg Oral Daily  . ferrous sulfate  325 mg Oral BID WC  . furosemide  80 mg Intravenous Daily  . gabapentin  300 mg Oral BID  . insulin aspart  0-9 Units Subcutaneous Q4H  . ipratropium  0.5 mg Nebulization Q6H  . mycophenolate  500 mg Oral BID  .  predniSONE  5 mg Oral BID WC  . primidone  50 mg Oral QHS  . sodium chloride  3 mL Intravenous Q12H  . white petrolatum       Continuous Infusions:   Active Problems:   History of renal transplant   HTN (hypertension)   Diabetes mellitus type 2, controlled   Renal failure (ARF), acute on chronic kidney disease stage 3   Dehydration   Acute  respiratory failure with hypoxia   Anemia in chronic kidney disease   Hypoglycemia  Time spent: 37  Pamella Pert, MD [facility] Pager [phone]. If 7 PM - 7 AM, please contact night-coverage at [email or web address], password [facility] 10/08/2013, 10:38 AM  LOS: 1 day

## 2013-10-08 NOTE — Progress Notes (Signed)
Report called to short stay charge. Consents signed and in front of chart. First attempt to draw labs unsuccessful-notified short stay charge of this.  Will send pre-op med and SCDs down with patient.  VSS.

## 2013-10-08 NOTE — Anesthesia Procedure Notes (Addendum)
Procedure Name: Intubation Date/Time: 10/08/2013 2:43 PM Performed by: Leatha Gilding Pre-anesthesia Checklist: Patient identified, Emergency Drugs available, Suction available and Patient being monitored Patient Re-evaluated:Patient Re-evaluated prior to inductionOxygen Delivery Method: Circle system utilized Preoxygenation: Pre-oxygenation with 100% oxygen Intubation Type: IV induction Ventilation: Mask ventilation without difficulty Grade View: Grade I Tube type: Oral Tube size: 7.5 mm Number of attempts: 1 Airway Equipment and Method: Stylet,  Oral airway and Video-laryngoscopy Placement Confirmation: ETT inserted through vocal cords under direct vision,  positive ETCO2 and breath sounds checked- equal and bilateral Secured at: 23 cm Tube secured with: Tape Dental Injury: Teeth and Oropharynx as per pre-operative assessment        ABOVE:  Upper [place]  is of Brachial A line  BELOW: [place] is of CVC.

## 2013-10-08 NOTE — Progress Notes (Signed)
Utilization review completed.  

## 2013-10-08 NOTE — Anesthesia Postprocedure Evaluation (Signed)
  Anesthesia Post-op Note  Patient: [name]  Procedure(s) Performed: Procedure(s): SUBXYPHOID PERICARDIAL WINDOW (N/A)  Patient Location: PACU  Anesthesia Type:General  Level of Consciousness: awake, alert  and oriented  Airway and Oxygen Therapy: Patient Spontanous Breathing  Post-op Pain: mild  Post-op Assessment: Post-op Vital signs reviewed  Post-op Vital Signs: Reviewed  Complications: No apparent anesthesia complications

## 2013-10-08 NOTE — H&P (Signed)
PULMONARY  / CRITICAL CARE MEDICINE  Name: [name] MRN: [id number] DOB: 1946-09-10    ADMISSION DATE:  10/07/2013 CONSULTATION DATE:  10/08/13  REFERRING MD :  Dr. Elvera Lennox PRIMARY SERVICE: Internal medicine teaching service   CHIEF COMPLAINT:  Progressive SOB  BRIEF PATIENT DESCRIPTION: 67 y.o. immunosuppressed male with PMH of ESRD s/p kidney transplant 40 years ago, CAD, CHF (EF 40-45%), HTN, insulin dependent DM presented to [facility] 11/23 via EMS for altered mental status.  Patient was admitted and subsequently found to have pericardial effusion on echo.  Patient taken to OR for pericardial window.  PCCM consulted as patient may come back from OR on vent.  SIGNIFICANT EVENTS / STUDIES:  11/23 admitted 11/24 develops shortness of breath and peripheral swelling 11/24 TTE - large pericardial effusion with no tamponade, EF 40-45% 11/24 Pericardial window in OR, post op hypoxia  LINES / TUBES: 11/24 ETT >>11/24 11/24 Pericardial drain >> PIV  CULTURES: None at this time  ANTIBIOTICS: 11/24 Zinacef x1  HISTORY OF PRESENT ILLNESS:  67 y.o. immunosuppressed male with PMH of ESRD s/p kidney transplant 40 years ago, CAD, CHF (EF 40-45%), HTN, insulin dependent DM presented to [facility] 11/23 via EMS for altered mental status.  Unpon admission patient was found to have peripheral edema and developed acute shortness of breath and hypoxia after missing a dose of lasix.  Patient reports that he is not compliant with his medications and diet.  Stat echo was performed showing large pericardial effusion without tamponade and unchanged EF from previous TTE.  Patient has a history of pericardial effusions dating as far back as May 2014.  Patient taken to OR for pericardial window.  PCCM consulted as patient may come back from OR on vent.  PAST MEDICAL HISTORY :  Past Medical History  Diagnosis Date  . CAD (coronary artery disease)     a. 03/1990 - s/p MI and PTCA  . Systolic congestive heart  failure 01/2012    a. 09/2013 Echo: EF 40-45%, inf/infsept & post HK, sev LVH, mod reduced RV fxn, PASP , large pericardial eff - 39 mm in maximal dimension, no RV collapse, dilated IVC w/o resp variation.  . Hypertension   . Diabetes mellitus   . ICD (implantable cardiac defibrillator) in place     a. BSX 02/2012, in setting of cardiac arrest.  . History of pneumonia 2011  . Arthritis   . History of blood transfusion   . CKD (chronic kidney disease), stage IV     a. 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 Cancer     a. Bladder Cancer- 2004;  b. Skin Cancer- Basal, Squamous, and a few melanoma  . Right foot drop   . Cellulitis   . Pericardial effusion     a. 09/2013 Echo: large pericardial eff - 39 mm in maximal dimension, no RV collapse, dilated IVC w/o resp variation.   Past Surgical History  Procedure Laterality Date  . Kidney transplant  02/13/1973  . Coronary angioplasty  03/1990  . Skin cancer excision      Numerous  . Eye surgery      Cataract Right Eye  . Insert / replace / remove pacemaker    . Uretheral transplation      Left to Right (transplanted kidney)  . Skin transplant      Left thigh to Left hand  . Knee arthroscopy  07/25/2012    Procedure: ARTHROSCOPY KNEE;  Surgeon: Cammy Copa, MD;  Location: [facility] OR;  Service: Orthopedics;  Laterality: Left;  Left knee arthroscopy, debridement, cultures  . Lumbar laminectomy/decompression microdiscectomy Bilateral 03/26/2013    Procedure: LUMBAR LAMINECTOMY/DECOMPRESSION MICRODISCECTOMY 1 LEVEL;  Surgeon: Reinaldo Meeker, MD;  Location: [facility] NEURO ORS;  Service: Neurosurgery;  Laterality: Bilateral;  . Lumbar laminectomy/decompression microdiscectomy N/A 04/19/2013    Procedure: Redo Lumbar Three to Four Decompression One LEVEL;  Surgeon: Cristi Loron, MD;  Location: [facility] NEURO ORS;  Service: Neurosurgery;  Laterality: N/A;  Redo Lumbar Three to Four Decompression One LEVEL   Prior to Admission  medications   Medication Sig Start Date End Date Taking? Authorizing Provider  ALPRAZolam (XANAX) 0.25 MG tablet Take 0.25 mg by mouth 2 (two) times daily.    Yes Historical Provider, MD  amLODipine (NORVASC) 5 MG tablet Take 5 mg by mouth 2 (two) times daily. 09/25/13  Yes Historical Provider, MD  aspirin EC 81 MG tablet Take 81 mg by mouth every evening.    Yes Historical Provider, MD  atorvastatin (LIPITOR) 80 MG tablet Take 80 mg by mouth daily.   Yes Historical Provider, MD  carvedilol (COREG) 12.5 MG tablet Take 12.5 mg by mouth 2 (two) times daily with a meal.   Yes Historical Provider, MD  ferrous sulfate 325 (65 FE) MG EC tablet Take 1 tablet (325 mg total) by mouth 2 (two) times daily with a meal. 09/02/13  Yes Elease Etienne, MD  fish oil-omega-3 fatty acids 1000 MG capsule Take 1 g by mouth daily.   Yes Historical Provider, MD  furosemide (LASIX) 40 MG tablet Take 2 tablets (80 mg total) by mouth daily as needed (if weight is above 173 lbs). 05/04/13  Yes Evlyn Kanner Love, PA-C  gabapentin (NEURONTIN) 300 MG capsule Take 300 mg by mouth 2 (two) times daily.   Yes Historical Provider, MD  HYDROcodone-acetaminophen (NORCO) 10-325 MG per tablet Take 1 tablet by mouth every 4 (four) hours as needed for moderate pain.  09/24/13  Yes Historical Provider, MD  insulin aspart (NOVOLOG) 100 UNIT/ML injection Inject 6 Units into the skin 3 (three) times daily before meals.   Yes Historical Provider, MD  Insulin Detemir (LEVEMIR FLEXPEN) 100 UNIT/ML SOPN Inject 40 Units into the skin every morning.    Yes Historical Provider, MD  Multiple Vitamin (MULTIVITAMIN WITH MINERALS) TABS Take 1 tablet by mouth daily.   Yes Historical Provider, MD  mycophenolate (CELLCEPT) 500 MG tablet Take 500 mg by mouth 2 (two) times daily.    Yes Historical Provider, MD  nitroGLYCERIN (NITROSTAT) 0.4 MG SL tablet Place 0.4 mg under the tongue every 5 (five) minutes as needed for chest pain. x3 doses as needed for chest  pain   Yes Historical Provider, MD  predniSONE (DELTASONE) 5 MG tablet Take 5 mg by mouth 2 (two) times daily.   Yes Historical Provider, MD  primidone (MYSOLINE) 50 MG tablet Take 50 mg by mouth at bedtime.  09/28/13  Yes Historical Provider, MD  ranitidine (ZANTAC) 300 MG tablet Take 300 mg by mouth at bedtime.    Yes Historical Provider, MD  tobramycin (TOBREX) 0.3 % ophthalmic solution Place 1 drop into both eyes as needed (for allergies).   Yes Historical Provider, MD  vitamin C (ASCORBIC ACID) 500 MG tablet Take 500 mg by mouth daily.   Yes Historical Provider, MD   No Known Allergies  FAMILY HISTORY:  Family History  Problem Relation Age of Onset  . Diabetes Mellitus II Mother   .  Lung cancer Father    SOCIAL HISTORY:  reports that he quit smoking about 30 years ago. He has never used smokeless tobacco. He reports that he does not drink alcohol or use illicit drugs.  REVIEW OF SYSTEMS:  Unable to perform as patient is on a vent  SUBJECTIVE:   VITAL SIGNS: Temp:  [97.6 F (36.4 C)-98.9 F (37.2 C)] 98.8 F (37.1 C) (11/24 1146) Pulse Rate:  [59-86] 81 (11/24 1416) Resp:  [14-26] 25 (11/24 0258) BP: (109-136)/(55-84) 125/58 mmHg (11/24 1146) SpO2:  [94 %-100 %] 100 % (11/24 0350) Weight:  [198 lb 6.6 oz (90 kg)] 198 lb 6.6 oz (90 kg) (11/24 0258) HEMODYNAMICS:   VENTILATOR SETTINGS:   INTAKE / OUTPUT: Intake/Output     11/23 0701 - 11/24 0700 11/24 0701 - 11/25 0700   P.O. 60    I.V. (mL/kg) 73 (0.8)    Total Intake(mL/kg) 133 (1.5)    Urine (mL/kg/hr) 475 500 (0.7)   Total Output 475 500   Net -342 -500          PHYSICAL EXAMINATION: General:  Chronically ill appearing male, somnolent but arousable Neuro:  A&Ox3, follows commands HEENT:  NCAT, MM moist and pink Cardiovascular:  RRR, II/VI murmur heard best over left sternal border, pericardial drain in place Lungs:  CTA bilaterally Abdomen:  +BS, soft, non-tender Musculoskeletal:  No gross  deformities Skin:  Anterior pericardial drain in place, 1cm inflamed nodule on left chest, old incision from pacemaker insertion  LABS:  Recent Labs Lab 10/07/13 1902  10/08/13 0420 10/08/13 0825 10/08/13 1200 10/08/13 1405 10/08/13 1406  HGB 8.0*  --  7.8*  --  7.2*  --   --   WBC 12.3*  --  10.8*  --  8.6  --   --   PLT 104*  --  124*  --  103*  --   --   NA 138  --  136  --  135  --   --   K 4.8  --  4.9  --  5.1  --   --   CL 105  --  104  --  102  --   --   CO2 16*  --  17*  --  17*  --   --   GLUCOSE 68*  --  83  --  149*  --   --   BUN 112*  --  112*  --  114*  --   --   CREATININE 3.28*  --  3.45*  --  3.43*  --   --   CALCIUM 8.2*  --  8.3*  --  8.0*  --   --   AST 25  --  21  --  16  --   --   ALT 22  --  23  --  19  --   --   ALKPHOS 58  --  58  --  55  --   --   BILITOT 0.3  --  0.2*  --  0.2*  --   --   PROT 4.7*  --  5.0*  --  4.7*  --   --   ALBUMIN 2.7*  --  2.6*  --  2.4*  --   --   APTT  --   --   --   --  29  --   --   INR  --   --   --   --  1.21  --   --   TROPONINI  --   < > <0.30 <0.30  --  <0.30  --   PROBNP  --   --   --  10879.0*  --   --   --   PHART  --   --   --   --   --   --  7.319*  PCO2ART  --   --   --   --   --   --  35.0  PO2ART  --   --   --   --   --   --  96.5  < > = values in this interval not displayed.  Recent Labs Lab 10/07/13 2235 10/08/13 0159 10/08/13 0305 10/08/13 0800 10/08/13 1135  GLUCAP 136* 108* 90 87 148*    CXR: 11/23 Cardiomegaly, no acute airspace disease, some pulmonary vascular prominence. No effusions noted  ASSESSMENT / PLAN:  PULMONARY A: Acute respiratory failure secondary to pericardial effusion P:   - Goal SpO2 > 92% - Supplemental O2 prn - CXR now - abg now, may require NIMV - D/c BDs as no history or home usage  CARDIOVASCULAR A:  Pericardial effusion s/p pericardial window Diastolic CHF - EF 40-45% CAD w/ history of MI H/o HTN P:  - Monitor on tele - Map goal > 65 - Monitor  pericardial drain output - care per sugery - Hold ASA until ok with cards/surgery - Hold lasix for now - Hold BP meds for now until BP stabilized  RENAL A:   Acute on chronic renal failure P:   - monitor SCr and UOP - Nephro following - Hemodialysis placed - Trend Bmet - Hold lasix for now due to BP - 800 mL bolus given in OR - KVO fluids -ensure IV steroids  GASTROINTESTINAL A:   No acute issues P:   - NPO for now - advance diet when ok with surgery - Pepcid for SUP  HEMATOLOGIC A:   Anemia of chronic disease P:  - cbc now - trend cbc - SCDs for prophylaxis  INFECTIOUS A:   Immunosupressed - on Cellcept therapy and pred RLE cellulitis - recently treated with doxycycline P:   - no cx or abx indicated at this time - Cont cellcept - monitor fever curve and WBCs  ENDOCRINE A:   Insulin dependent DM P:   - monitor CBGs - SSI  NEUROLOGIC A: Chronic pain Peripheral diabetic neuropathy  Anxiety P:   - monitor neuro status - Gabapentin 300 mg TID - Xanax 0.25 mg prn - post op pain management per surgery  Terri Piedra [facility] Physician Assistant Student 10/08/13, 4:48 PM  TODAY'S SUMMARY: Pt s/p pericardial window. Extubated in OR.  Will get post op CXR and abg now.  Have held BDs currently as patient does not have any active wheezing, nor a history of BD use.  Have held lasix and BP meds for now and will give patient time to stabilize BP post op.  Hold parameters have been placed on meds.  Cards, renal, and surgery all following patient.  Hemodialysis orders have been placed by renal.  Will continue to monitor respiratory status closely.     I have personally obtained a history, examined the patient, evaluated laboratory and imaging results, formulated the assessment and plan and placed orders. CRITICAL CARE: The patient is critically ill with multiple organ systems failure and requires high complexity decision making for assessment and  support, frequent evaluation and  titration of therapies, application of advanced monitoring technologies and extensive interpretation of multiple databases. Critical Care Time devoted to patient care services described in this note is 35 minutes.   Mcarthur Rossetti. Tyson Alias, MD, FACP Pgr: [phone] [facility]  Pulmonary and Critical Care Medicine [facility] Pager: [phone]  10/08/2013, 3:11 PM

## 2013-10-08 NOTE — ED Notes (Signed)
CBG checked 108 RN Tobi Bastos informed

## 2013-10-08 NOTE — Progress Notes (Signed)
[employer] rep still not here, OK to Tx pt to SICU and rep can see pt there, per dr Noreene Larsson

## 2013-10-08 NOTE — Brief Op Note (Addendum)
      [address]       [place] [address]             [phone]       10/08/2013  3:49 PM  PATIENT:  [name]  67 y.o. male  PRE-OPERATIVE DIAGNOSIS:  PERICARDIAL EFFUSION  POST-OPERATIVE DIAGNOSIS:  PERICARDIAL EFFUSION/ path pending  PROCEDURE:  SUBXYPHOID PERICARDIAL WINDOW   FINDINGS: Approximately 1.5 liters of light yellowish fluid removed  SURGEON:  Surgeon(s) and Role:    * Delight Ovens, MD - Primary  PHYSICIAN ASSISTANT: Doree Fudge PA-C   ANESTHESIA:   general  EBL:  Total I/O In: 800 [I.V.:800] Out: 970 [Urine:950; Blood:20]  BLOOD ADMINISTERED:none  DRAINS: 28 Bard Drain placed into pericardial space  SPECIMEN:  Source of Specimen:  Pericardial biopsy and pleural fluid  DISPOSITION OF SPECIMEN:  PATHOLOGY  COUNTS CORRECT:  YES  DICTATION: .Dragon Dictation  PLAN OF CARE: Admit to inpatient   PATIENT DISPOSITION:  ICU - extubated and stable.   Delay start of Pharmacological VTE agent (>24hrs) due to surgical blood loss or risk of bleeding: yes

## 2013-10-08 NOTE — Consult Note (Signed)
CARDIOLOGY CONSULT NOTE   Patient ID: [name] MRN: [id number], DOB/AGE: 1946-09-07   Admit date: 10/07/2013 Date of Consult: 10/08/2013   Primary Physician: [name], MD Primary Cardiologist: K. Lady Gary, MD - [facility] - [name]  Pt. Profile  67 y/o male with h/o systolic CHF and renal failure s/p transplant, whom we've been asked to eval 2/2 acute on chronic large pericardial effusion.  Problem List  Past Medical History  Diagnosis Date  . CAD (coronary artery disease)     a. 03/1990 - s/p MI and PTCA  . Systolic congestive heart failure 01/2012    a. 09/2013 Echo: EF 40-45%, inf/infsept & post HK, sev LVH, mod reduced RV fxn, PASP , large pericardial eff - 39 mm in maximal dimension, no RV collapse, dilated IVC w/o resp variation.  . Hypertension   . Diabetes mellitus   . ICD (implantable cardiac defibrillator) in place     a. BSX 02/2012, in setting of cardiac arrest.  . History of pneumonia 2011  . Arthritis   . History of blood transfusion   . CKD (chronic kidney disease), stage IV     a. 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 Cancer     a. Bladder Cancer- 2004;  b. Skin Cancer- Basal, Squamous, and a few melanoma  . Right foot drop   . Cellulitis   . Pericardial effusion     a. 09/2013 Echo: large pericardial eff - 39 mm in maximal dimension, no RV collapse, dilated IVC w/o resp variation.    Past Surgical History  Procedure Laterality Date  . Kidney transplant  02/13/1973  . Coronary angioplasty  03/1990  . Skin cancer excision      Numerous  . Eye surgery      Cataract Right Eye  . Insert / replace / remove pacemaker    . Uretheral transplation      Left to Right (transplanted kidney)  . Skin transplant      Left thigh to Left hand  . Knee arthroscopy  07/25/2012    Procedure: ARTHROSCOPY KNEE;  Surgeon: Cammy Copa, MD;  Location: [facility] OR;  Service: Orthopedics;  Laterality: Left;  Left knee arthroscopy,  debridement, cultures  . Lumbar laminectomy/decompression microdiscectomy Bilateral 03/26/2013    Procedure: LUMBAR LAMINECTOMY/DECOMPRESSION MICRODISCECTOMY 1 LEVEL;  Surgeon: Reinaldo Meeker, MD;  Location: [facility] OR[facility];  Service: Neurosurgery;  Laterality: Bilateral;  . Lumbar laminectomy/decompression microdiscectomy N/A 04/19/2013    Procedure: Redo Lumbar Three to Four Decompression One LEVEL;  Surgeon: Cristi Loron, MD;  Location: [facility] OR[facility];  Service: Neurosurgery;  Laterality: N/A;  Redo Lumbar Three to Four Decompression One LEVEL     Allergies  No Known Allergies  HPI   67 y/o male with the above complex problem list.  He was last seen by our practice during his admission to [facility] in May of this year, when he was admitted following a recent back surgery with confusion and dyspnea and was found to have mild CHF and elevated troponins.  Echo showed a large pericardial effusion w/o tamponade and he was initially placed on colchicine however this was discontinued 2/2 diarrhea.  F/u echo showed improvement in size.  Pt was discharged to inpatient rehab, where he stayed for an additional 10 days.  He was readmitted in October with LUE cellulitis, at which time his renal dzs and CHF were stable.  He was discharged home on 10/19.  He says that he  felt somewhat short of breath during that hospitalization but didn't report it. Over the past month, his dyspnea has progressed to the point that over the past 1-2 wks, he has been experiencing dyspnea with minimal activity and even at rest.  He continued to take his usual dose of lasix but noted less and less response along with some progression of his usual bilat UE/LE edema.  Due to worsening dyspnea, he presented to the ED last night where CXR showed no acute findings and creatinine was elevated above previous baseline @ 3.45.  He was admitted by internal medicine and an echocardiogram was performed this AM revealing a large pericardial effusion,  which was larger than on last echo in May, without tamponade physiology.  According to pt, since dx of pericardial effusion in May, this has been followed by Dr. Lady Gary in [place].  He remains mildly dyspneic w/o chest pain.  CT surgery has been consulted and arrangements are underway for a pericardial window this afternoon.  Inpatient Medications  . ALPRAZolam  0.25 mg Oral BID  . amLODipine  5 mg Oral BID  . aspirin EC  81 mg Oral QPM  . atorvastatin  80 mg Oral Daily  . carvedilol  12.5 mg Oral BID WC  . cefUROXime (ZINACEF)  IV  1.5 g Intravenous 60 min Pre-Op  . famotidine  20 mg Oral Daily  . ferrous sulfate  325 mg Oral BID WC  . furosemide  80 mg Intravenous Daily  . gabapentin  300 mg Oral BID  . insulin aspart  0-9 Units Subcutaneous Q4H  . ipratropium  0.5 mg Nebulization Q6H  . mycophenolate  500 mg Oral BID  . predniSONE  5 mg Oral BID WC  . primidone  50 mg Oral QHS  . sodium chloride  3 mL Intravenous Q12H    Family History Family History  Problem Relation Age of Onset  . Diabetes Mellitus II Mother   . Lung cancer Father      Social History History   Social History  . Marital Status: Married    Spouse Name: N/A    Number of Children: N/A  . Years of Education: N/A   Occupational History  . Not on file.   Social History Main Topics  . Smoking status: Former Smoker -- 30 years    Quit date: 02/14/1983  . Smokeless tobacco: Never Used  . Alcohol Use: No  . Drug Use: No  . Sexual Activity: Not on file   Other Topics Concern  . Not on file   Social History Narrative   Lives in [place] with his wife.  He does not routinely exercise.     Review of Systems  General:  No chills, fever, night sweats or weight changes.  Cardiovascular:  No chest pain, +++dyspnea on exertion and at rest, +++ bilat UE/LE edema, +++orthopnea, no palpitations, paroxysmal nocturnal dyspnea. Dermatological: No rash, lesions/masses Respiratory: No cough, +++  dyspnea Urologic: No hematuria, dysuria Abdominal:   No nausea, vomiting, diarrhea, bright red blood per rectum, melena, or hematemesis Neurologic:  No visual changes, wkns, changes in mental status. MSK:  Chronic LBP. All other systems reviewed and are otherwise negative except as noted above.  Physical Exam  Blood pressure 125/58, pulse 74, temperature 98.9 F (37.2 C), temperature source Oral, resp. rate 25, height 6\' 2"  (1.88 m), weight 198 lb 6.6 oz (90 kg), SpO2 100.00%.  General: Pleasant, NAD Psych: Normal affect. Neuro: Alert and oriented X 3. Moves all  extremities spontaneously. HEENT: Normal  Neck: Supple without bruits, JVD. Lungs:  Resp regular and unlabored, few basilar crackles on left. Heart: RRR no s3, s4, no murmurs, + rub. Abdomen: Soft, non-tender, non-distended, BS + x 4.  Extremities: No clubbing, cyanosis.  1+ bilat UE/LE edema. DP/PT/Radials 2+ and equal bilaterally. Skin: notable for diffuse plaques from head to toe with erythema of bilat arms and lower legs. Labs   Recent Labs  10/07/13 2008 10/08/13 0420 10/08/13 0825  TROPONINI <0.30 <0.30 <0.30   Lab Results  Component Value Date   WBC 10.8* 10/08/2013   HGB 7.8* 10/08/2013   HCT 24.9* 10/08/2013   MCV 95.4 10/08/2013   PLT 124* 10/08/2013     Recent Labs Lab 10/08/13 0420  NA 136  K 4.9  CL 104  CO2 17*  BUN 112*  CREATININE 3.45*  CALCIUM 8.3*  PROT 5.0*  BILITOT 0.2*  ALKPHOS 58  ALT 23  AST 21  GLUCOSE 83   Lab Results  Component Value Date   DDIMER 2.59* 10/08/2013    Radiology/Studies  Dg Chest 2 View  10/07/2013   CLINICAL DATA:  Altered mental status  EXAM: CHEST  2 VIEW  COMPARISON:  04/11/2013  FINDINGS: Severe cardiac enlargement. This is stable. Pacer is unchanged. Lungs are clear.  IMPRESSION: No active cardiopulmonary disease. Stable severe cardiac enlargement.   Electronically Signed   By: Esperanza Heir M.D.   On: 10/07/2013 21:24   ECG  Vpaced, 76.   No acute changes.  Tele - v paced, occas av pacing  2D Echocardiogram 11.24.2014  Study Conclusions  - Left ventricle: LVEF is approximately 40 to 45% with   inferior/inferoseptal and posterior hypokinesis. The   cavity size was normal. Wall thickness was increased in a   pattern of severe LVH. - Right ventricle: Systolic function was moderately reduced. - Pulmonary arteries: PA peak pressure: 52mm Hg (S). - Pericardium, extracardiac: Large pericardial effusion   surrounds heart. Measures 39 mm in maximal dimension.   Larger than on echo in July 2014, There is no mitral   inflow variation There is no RV collapse The IVC is   dilated with no respiratory variation. Does not meet full   echo criteria for tamponade. Discussed with Dr. Lafe Garin for   clinical correlation. _____________   ASSESSMENT AND PLAN  1.  Acute on chronic large pericardial effusion:  With progressive dyspnea over the past month.  Seen by CT surgery with plan of pericardial window.  2.  Acute on chronic systolic chf:  Weight is up about 18 lbs since his discharge in October.  He is on lasix @ home.  Diuretic dosing per nehrology given acute on chronic renal failure s/p transplant.  Cont bb.  No acei/arb 2/2 acute on CKD.  3.  CKD IV s/p remote renal transplant:  Per nephrology.  4.  CAD:  No chest pain. Cont asa/bb/statin.  5.  DM:  On SSI - per IM.  Signed, Nicolasa Ducking, NP 10/08/2013, 12:18 PM Patient seen and examined. I agree with the assessment and plan as detailed above. See also my additional thoughts below.   The patient's cardiac status is followed by Dr. Lady Gary of the [facility] in [place]. His ICD is followed there also. Echoes over the past several months have shown large pericardial effusion. It is even larger now. There is no definite evidence of tamponade. However, I believe that this effusion does play a role in his overall cardiac status.  He will be appropriate to proceed with a  pericardial window today. This is already being scheduled. Hopefully, with removal of pericardial fluid and careful diuresis with the nephrology team the patient volume status can be stabilized.  Willa Rough, MD, [facility] 10/08/2013 1:40 PM

## 2013-10-08 NOTE — Preoperative (Signed)
Beta Blockers   Reason not to administer Beta Blockers:Not Applicable. Carvedilol received on 10/08/13 at 0900.

## 2013-10-09 DIAGNOSIS — D631 Anemia in chronic kidney disease: Secondary | ICD-10-CM

## 2013-10-09 DIAGNOSIS — Z940 Kidney transplant status: Secondary | ICD-10-CM

## 2013-10-09 DIAGNOSIS — I10 Essential (primary) hypertension: Secondary | ICD-10-CM

## 2013-10-09 DIAGNOSIS — I5023 Acute on chronic systolic (congestive) heart failure: Secondary | ICD-10-CM | POA: Diagnosis present

## 2013-10-09 DIAGNOSIS — N189 Chronic kidney disease, unspecified: Secondary | ICD-10-CM

## 2013-10-09 DIAGNOSIS — I255 Ischemic cardiomyopathy: Secondary | ICD-10-CM | POA: Diagnosis present

## 2013-10-09 LAB — BASIC METABOLIC PANEL
BUN: 107 mg/dL — ABNORMAL HIGH (ref 6–23)
BUN: 110 mg/dL — ABNORMAL HIGH (ref 6–23)
CO2: 17 mEq/L — ABNORMAL LOW (ref 19–32)
CO2: 14 mEq/L — ABNORMAL LOW (ref 19–32)
Calcium: 8.2 mg/dL — ABNORMAL LOW (ref 8.4–10.5)
Calcium: 8.1 mg/dL — ABNORMAL LOW (ref 8.4–10.5)
Chloride: 101 mEq/L (ref 96–112)
Creatinine, Ser: 3.41 mg/dL — ABNORMAL HIGH (ref 0.50–1.35)
Creatinine, Ser: 3.44 mg/dL — ABNORMAL HIGH (ref 0.50–1.35)
GFR calc Af Amer: 20 mL/min — ABNORMAL LOW (ref >90)
GFR calc Af Amer: 20 mL/min — ABNORMAL LOW (ref >90)
GFR calc non Af Amer: 17 mL/min — ABNORMAL LOW (ref >90)
Glucose, Bld: 94 mg/dL (ref 70–99)
Glucose, Bld: 197 mg/dL — ABNORMAL HIGH (ref 70–99)
Potassium: 5.3 mEq/L — ABNORMAL HIGH (ref 3.5–5.1)
Sodium: 135 mEq/L (ref 135–145)
Sodium: 132 mEq/L — ABNORMAL LOW (ref 135–145)

## 2013-10-09 LAB — CBC
Hemoglobin: 7.2 g/dL — ABNORMAL LOW (ref 13.0–17.0)
MCH: 30.9 pg (ref 26.0–34.0)
MCHC: 32.7 g/dL (ref 30.0–36.0)
MCV: 94.4 fL (ref 78.0–100.0)
RBC: 2.33 MIL/uL — ABNORMAL LOW (ref 4.22–5.81)

## 2013-10-09 LAB — GLUCOSE, CAPILLARY
Glucose-Capillary: 124 mg/dL — ABNORMAL HIGH (ref 70–99)
Glucose-Capillary: 94 mg/dL (ref 70–99)
Glucose-Capillary: 95 mg/dL (ref 70–99)
Glucose-Capillary: 186 mg/dL — ABNORMAL HIGH (ref 70–99)
Glucose-Capillary: 191 mg/dL — ABNORMAL HIGH (ref 70–99)
Glucose-Capillary: 207 mg/dL — ABNORMAL HIGH (ref 70–99)
Glucose-Capillary: 178 mg/dL — ABNORMAL HIGH (ref 70–99)

## 2013-10-09 LAB — POCT I-STAT 3, ART BLOOD GAS (G3+)
Acid-base deficit: 9 mmol/L — ABNORMAL HIGH (ref 0.0–2.0)
Bicarbonate: 16.7 mEq/L — ABNORMAL LOW (ref 20.0–24.0)
pCO2 arterial: 33 mmHg — ABNORMAL LOW (ref 35.0–45.0)
pH, Arterial: 7.31 — ABNORMAL LOW (ref 7.350–7.450)
pO2, Arterial: 71 mmHg — ABNORMAL LOW (ref 80.0–100.0)

## 2013-10-09 MED ORDER — ACETAMINOPHEN 325 MG PO TABS
650.0000 mg | ORAL_TABLET | Freq: Four times a day (QID) | ORAL | Status: DC | PRN
  Administered 2013-10-13: 650 mg via ORAL
  Filled 2013-10-09: qty 2

## 2013-10-09 MED ORDER — OXYCODONE HCL 5 MG PO TABS
5.0000 mg | ORAL_TABLET | ORAL | Status: DC | PRN
  Administered 2013-10-09 – 2013-10-14 (×17): 5 mg via ORAL
  Filled 2013-10-09 (×16): qty 1

## 2013-10-09 MED ORDER — INSULIN ASPART 100 UNIT/ML ~~LOC~~ SOLN: 0.0000 [IU] | Freq: Every day | SUBCUTANEOUS | Status: DC

## 2013-10-09 MED ORDER — PREDNISONE 5 MG PO TABS
5.0000 mg | ORAL_TABLET | Freq: Two times a day (BID) | ORAL | Status: DC
Start: 2013-10-09 — End: 2013-10-17
  Administered 2013-10-09 – 2013-10-17 (×14): 5 mg via ORAL
  Filled 2013-10-09 (×19): qty 1

## 2013-10-09 MED ORDER — INSULIN ASPART 100 UNIT/ML ~~LOC~~ SOLN
0.0000 [IU] | Freq: Three times a day (TID) | SUBCUTANEOUS | Status: DC
  Administered 2013-10-09 (×2): 5 [IU] via SUBCUTANEOUS
  Administered 2013-10-10 (×2): 3 [IU] via SUBCUTANEOUS
  Administered 2013-10-10: 5 [IU] via SUBCUTANEOUS
  Administered 2013-10-11 (×3): 3 [IU] via SUBCUTANEOUS
  Administered 2013-10-12 – 2013-10-14 (×3): 2 [IU] via SUBCUTANEOUS
  Administered 2013-10-14: 5 [IU] via SUBCUTANEOUS
  Administered 2013-10-14 – 2013-10-15 (×2): 2 [IU] via SUBCUTANEOUS
  Administered 2013-10-15: 3 [IU] via SUBCUTANEOUS
  Administered 2013-10-17: 2 [IU] via SUBCUTANEOUS

## 2013-10-09 NOTE — Care Management Note (Signed)
    Page 1 of 1   10/09/2013     3:00:58 PM   CARE MANAGEMENT NOTE 10/09/2013  Patient:  [name], [name]   Account Number:  [id number]  Date Initiated:  10/09/2013  Documentation initiated by:  Cameren Odwyer  Subjective/Objective Assessment:   adm with dx of hypoglycemia, transferred to 2S s/p pericardial window; lives with spouse    PCP  Dr [name]     DC Planning Services  CM consult      Per UR Regulation:  Reviewed for med. necessity/level of care/duration of stay  Comments:  ContactAllena Napoleon  [phone]  10/09/13 1119 Leiliana Foody RN MSN BSN CCM Met @ bedside with pt and dtr (spouse has PNA).  Dtr states pt has had difficulty with mobility due to weakness for several weeks, will need rehab before returning home.  Pt had previous stay in inpt rehab in June and would prefer to xfer there when medically stable.  Insertion of temporary HD cath scheduled for a.m.

## 2013-10-09 NOTE — Progress Notes (Signed)
Patient ID: [name], male   DOB: [birth year], 67 y.o.   MRN: [id number]   [facility] Progress Note    Assessment/ Plan:   1. Acute renal failure on chronic kidney disease stage IV. He underwent pericardial window yesterday with drainage of 1.5 L of clear fluid. Significant improvement of urine output noted overnight however without any remarkable improvement of BUN/creatinine. No acute electrolyte or volume concerns for emergent dialysis. His worsening renal function and significant pericardial effusion raises the possibility of this having being a uremic pericardial effusion-if labs do not improve in the next 24 hours, plan on undertaking dialysis and consulting vascular surgery for placement of a permanent dialysis access  (patient desires in-Center hemodialysis). Continue immunosuppression with CellCept and prednisone.  2. Shortness of breath/pericardial effusion: Shortness of breath much better after pericardial fluid drained-clinically seems to be doing better than he was yesterday. 3. Anemia: Likely anemia secondary to chronic kidney disease. He he has significant iron deficiency-we'll give him intravenous iron and start on ESA from tomorrow.  4. Metabolic bone disease: We'll start on calcitriol given elevated PTH.  5. Nutrition: Low albumin noted and likely to be reflective of worsening chronic kidney disease. Started on nutritional supplements.   Subjective:   Reports to be feeling fair-reports significant improvement of his shortness of breath (has some chest pain/tenderness as expected status post subxiphoid pericardial window).    Objective:   BP 109/58  Pulse 78  Temp(Src) 98 F (36.7 C) (Oral)  Resp 17  Ht 6\' 2"  (1.88 m)  Wt 86.9 kg (191 lb 9.3 oz)  BMI 24.59 kg/m2  SpO2 96%  Intake/Output Summary (Last 24 hours) at 10/09/13 0749 Last data filed at 10/09/13 0600  Gross per 24 hour  Intake   1480 ml  Output   2555 ml  Net  -1075 ml   Weight change:  -2.909 kg (-6 lb 6.6 oz)  Physical Exam: Gen: Comfortable resting in bed-appears to be mentating better than yesterday CVS: Pulse regular in rate and rhythm, S1 and S2 normal Resp: Decreased breath sounds over bases otherwise clear to auscultation Abd: Soft, flat, nontender and bowel sounds are normal Ext: Trace to 1+ lower extremity edema  Imaging: Dg Chest 2 View  10/07/2013   CLINICAL DATA:  Altered mental status  EXAM: CHEST  2 VIEW  COMPARISON:  04/11/2013  FINDINGS: Severe cardiac enlargement. This is stable. Pacer is unchanged. Lungs are clear.  IMPRESSION: No active cardiopulmonary disease. Stable severe cardiac enlargement.   Electronically Signed   By: Esperanza Heir M.D.   On: 10/07/2013 21:24   Dg Chest Portable 1 View  10/08/2013   CLINICAL DATA:  Postop for central line placement. Renal transplant.  EXAM: PORTABLE CHEST - 1 VIEW  COMPARISON:  10/07/2013  FINDINGS: Pacer/AICD device which is unchanged.  Right internal jugular line which terminates at the mid to low SVC.  Midline trachea. Cardiomegaly accentuated by AP portable technique. Small left and possible small right pleural effusions. No pneumothorax. Diminished lung volumes. Development of mild pulmonary venous congestion. Bibasilar left greater than right airspace disease. Similar, given differences in technique.  IMPRESSION: Right IJ central line terminating at mid to low SVC, without pneumothorax.  Worsened aeration, with development of mild pulmonary venous congestion and decreased lung volumes.  Similar small left and possible right pleural effusions with adjacent atelectasis or infection.   Electronically Signed   By: Jeronimo Greaves M.D.   On: 10/08/2013 17:50    Labs:  BMET  Recent Labs Lab 10/07/13 1902 10/08/13 0420 10/08/13 1200 10/09/13 0500  NA 138 136 135 135  K 4.8 4.9 5.1 4.9  CL 105 104 102 103  CO2 16* 17* 17* 17*  GLUCOSE 68* 83 149* 94  BUN 112* 112* 114* 107*  CREATININE 3.28* 3.45* 3.43* 3.41*   CALCIUM 8.2* 8.3* 8.0* 8.2*   CBC  Recent Labs Lab 10/07/13 1902 10/08/13 0420 10/08/13 1200 10/09/13 0500  WBC 12.3* 10.8* 8.6 8.2  NEUTROABS 11.3* 9.8*  --   --   HGB 8.0* 7.8* 7.2* 7.2*  HCT 24.1* 24.9* 23.4* 22.0*  MCV 93.1 95.4 94.4 94.4  PLT 104* 124* 103* 93*    Medications:    . acetaminophen  1,000 mg Oral Q6H   Or  . acetaminophen (TYLENOL) oral liquid 160 mg/5 mL  1,000 mg Oral Q6H  . ALPRAZolam  0.25 mg Oral BID  . amLODipine  5 mg Oral BID  . bisacodyl  10 mg Oral Daily  . carvedilol  12.5 mg Oral BID WC  . cefUROXime (ZINACEF)  IV  1.5 g Intravenous Q12H  . famotidine  20 mg Oral Daily  . ferrous sulfate  325 mg Oral BID WC  . gabapentin  300 mg Oral BID  . hydrocortisone sodium succinate  50 mg Intravenous Q6H  . insulin aspart  0-24 Units Subcutaneous Q6H  . insulin aspart  0-9 Units Subcutaneous Q4H  . mycophenolate  500 mg Oral BID  . pantoprazole  40 mg Oral Daily  . sodium chloride  3 mL Intravenous Q12H   Zetta Bills, MD 10/09/2013, 7:49 AM

## 2013-10-09 NOTE — H&P (Addendum)
PULMONARY  / CRITICAL CARE MEDICINE  Name: [name] MRN: [id number] DOB: Nov 28, 1945    ADMISSION DATE:  10/07/2013 CONSULTATION DATE:  10/08/13  REFERRING MD :  Danise Edge PRIMARY SERVICE: IMTS  CHIEF COMPLAINT:  Dyspnea  BRIEF PATIENT DESCRIPTION: 67 yo immunosuppressed with ESRD s/p kidney transplant, CAD, CHF (EF 40-45%), HTN, insulin dependent DM. Found to have pericardial effusion on echo. Taken to OR for pericardial window.   SIGNIFICANT EVENTS / STUDIES:  11/23  Admitted with progressive dyspnea 11/24  TTE >>> Large pericardial effusion without tamponade, EF 40-45% 11/24  OR >>> Pericardial window  LINES / TUBES: OETT 11/24 >>> 11/24 Foley 11/24 >>> R IJ TLC 11/24 >>> R brach A-line 11/24 >>> 11/25 Pericardial drain 11/24 >>>  CULTURES:  ANTIBIOTICS: Zinacef 11/24 >>>  SUBJECTIVE: Feels better.  Less dyspnea.  Tolerating clear fluids well.  VITAL SIGNS: Temp:  [97.8 F (36.6 C)-98.8 F (37.1 C)] 97.9 F (36.6 C) (11/25 0754) Pulse Rate:  [74-86] 78 (11/25 0600) Resp:  [13-25] 17 (11/25 0600) BP: (109-134)/(55-71) 109/58 mmHg (11/25 0400) SpO2:  [92 %-99 %] 96 % (11/25 0600) Arterial Line BP: (105-151)/(38-78) 105/38 mmHg (11/25 0600) Weight:  [86.9 kg (191 lb 9.3 oz)-87.091 kg (192 lb)] 86.9 kg (191 lb 9.3 oz) (11/25 0500)  HEMODYNAMICS:   VENTILATOR SETTINGS:   INTAKE / OUTPUT: Intake/Output     11/24 0701 - 11/25 0700 11/25 0701 - 11/26 0700   P.O. 360    I.V. (mL/kg) 1070 (12.3)    IV Piggyback 50    Total Intake(mL/kg) 1480 (17)    Urine (mL/kg/hr) 2085 (1)    Blood 20 (0)    Chest Tube 450 (0.2)    Total Output 2555     Net -1075            PHYSICAL EXAMINATION: General:  No distress, resting comfortable Neuro:  Awake, alert, cooperative HEENT:  PERRL, moist membraanes Cardiovascular:  Regular, pericardial drain in place Lungs:  Bilateral air entry, few bibasilar rales Abdomen:  Soft, non tender, bowel sounds present Musculoskeletal:   No gross deformities Skin:  No rash  LABS: CBC  Recent Labs Lab 10/08/13 0420 10/08/13 1200 10/09/13 0500  WBC 10.8* 8.6 8.2  HGB 7.8* 7.2* 7.2*  HCT 24.9* 23.4* 22.0*  PLT 124* 103* 93*   Coag's  Recent Labs Lab 10/08/13 1200 10/09/13 0500  APTT 29  --   INR 1.21 1.37   BMET  Recent Labs Lab 10/08/13 0420 10/08/13 1200 10/09/13 0500  NA 136 135 135  K 4.9 5.1 4.9  CL 104 102 103  CO2 17* 17* 17*  BUN 112* 114* 107*  CREATININE 3.45* 3.43* 3.41*  GLUCOSE 83 149* 94   Electrolytes  Recent Labs Lab 10/08/13 0420 10/08/13 1200 10/09/13 0500  CALCIUM 8.3* 8.0* 8.2*   Sepsis Markers No results found for this basename: LATICACIDVEN, PROCALCITON, O2SATVEN,  in the last 168 hours  ABG  Recent Labs Lab 10/08/13 1406 10/08/13 1625 10/09/13 0503  PHART 7.319* 7.283* 7.310*  PCO2ART 35.0 36.8 33.0*  PO2ART 96.5 137.0* 71.0*   Liver Enzymes  Recent Labs Lab 10/07/13 1902 10/08/13 0420 10/08/13 1200  AST 25 21 16   ALT 22 23 19   ALKPHOS 58 58 55  BILITOT 0.3 0.2* 0.2*  ALBUMIN 2.7* 2.6* 2.4*   Cardiac Enzymes  Recent Labs Lab 10/08/13 0420 10/08/13 0825 10/08/13 1405  TROPONINI <0.30 <0.30 <0.30  PROBNP  --  10879.0*  --  Glucose  Recent Labs Lab 10/08/13 1135 10/08/13 1606 10/08/13 2038 10/09/13 0019 10/09/13 0500 10/09/13 0750  GLUCAP 148* 158* 124* 94 94 95   CXR:  None today  ASSESSMENT / PLAN:  PULMONARY A:  Acute respiratory failure secondary to pericardial effusion - resolved P:   Goal SpO2 > 92 Supplemental oxygen PRN  CARDIOVASCULAR A: Pericardial effusion s/p pericardial window.  Acute om chronic systolic / diastolic CHF. CAD w/ history of MI. H/o HTN. P:  Goal MAP > 65 TCTS following Restart ASA when OK with TCTS Amlodipine, Coreg Restart Lipitor D/c A-line  RENAL A:  Acute on chronic renal failure.  S/p renal transplant.  Metabolic acidosis. P:   Trend BMET Renal Following Hold Lasix IVF  KVO Cellcept Restart Prednisone IR to place HD cath  GASTROINTESTINAL A:   No acute issues P:   Renal Diet Preadmission Pepcid  HEMATOLOGIC A:  Anemia of chronic / renal disease.  Thrombocytopenia. P:  Trend CBC SCDs for DVT Px  INFECTIOUS A:  Chronically immunosuppressed, renal transplant. P:   Preadmission Cellcept / Prednisone Prophylactic abx per TCTS  ENDOCRINE A:  DM. P:   SSI, change to ac&hs D/c stress dose steroids as back on preadmission Prednisone  NEUROLOGIC A:  Post op pain. Peripheral diabetic neuropathy. Anxiety. P:   Preadmission Gabapentin / Xanax Oxycodone PRN  I have personally obtained history, examined patient, evaluated and interpreted laboratory and imaging results, reviewed medical records, formulated assessment / plan and placed orders.  Lonia Farber, MD Pulmonary and Critical Care Medicine [facility] Pager: [phone]  10/09/2013, 9:36 AM

## 2013-10-09 NOTE — H&P (Signed)
[name] is an 67 y.o. male.   Chief Complaint: Scheduled for temporary dialysis catheter placement Acute on chronic kidney disease Renal transplant 40 yrs ago and has done well Recent decrease urine output; nausea; SOB Work up shows worsening renal function and pericardial effusion Pericardial window 11/24- 1.5L yellow fluid obtained Slight improvement in renal fxn today Spoke to Dr Allena Katz myself Now wants temp cath in am  HPI: CAD; CHF; DM; HTN; cardiac defibrillator; Bladder ca  Past Medical History  Diagnosis Date  . CAD (coronary artery disease)     a. 03/1990 - s/p MI and PTCA  . Systolic congestive heart failure 01/2012    a. 09/2013 Echo: EF 40-45%, inf/infsept & post HK, sev LVH, mod reduced RV fxn, PASP , large pericardial eff - 39 mm in maximal dimension, no RV collapse, dilated IVC w/o resp variation.  . Hypertension   . Diabetes mellitus   . ICD (implantable cardiac defibrillator) in place     a. BSX 02/2012, in setting of cardiac arrest.  . History of pneumonia 2011  . Arthritis   . History of blood transfusion   . CKD (chronic kidney disease), stage IV     a. 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 Cancer     a. Bladder Cancer- 2004;  b. Skin Cancer- Basal, Squamous, and a few melanoma  . Right foot drop   . Cellulitis   . Pericardial effusion     a. 09/2013 Echo: large pericardial eff - 39 mm in maximal dimension, no RV collapse, dilated IVC w/o resp variation.    Past Surgical History  Procedure Laterality Date  . Kidney transplant  02/13/1973  . Coronary angioplasty  03/1990  . Skin cancer excision      Numerous  . Eye surgery      Cataract Right Eye  . Insert / replace / remove pacemaker    . Uretheral transplation      Left to Right (transplanted kidney)  . Skin transplant      Left thigh to Left hand  . Knee arthroscopy  07/25/2012    Procedure: ARTHROSCOPY KNEE;  Surgeon: Cammy Copa, MD;  Location: [facility] OR;  Service:  Orthopedics;  Laterality: Left;  Left knee arthroscopy, debridement, cultures  . Lumbar laminectomy/decompression microdiscectomy Bilateral 03/26/2013    Procedure: LUMBAR LAMINECTOMY/DECOMPRESSION MICRODISCECTOMY 1 LEVEL;  Surgeon: Reinaldo Meeker, MD;  Location: [facility];  Service: Neurosurgery;  Laterality: Bilateral;  . Lumbar laminectomy/decompression microdiscectomy N/A 04/19/2013    Procedure: Redo Lumbar Three to Four Decompression One LEVEL;  Surgeon: Cristi Loron, MD;  Location: [facility];  Service: Neurosurgery;  Laterality: N/A;  Redo Lumbar Three to Four Decompression One LEVEL    Family History  Problem Relation Age of Onset  . Diabetes Mellitus II Mother   . Lung cancer Father    Social History:  reports that he quit smoking about 30 years ago. He has never used smokeless tobacco. He reports that he does not drink alcohol or use illicit drugs.  Allergies: No Known Allergies  Medications Prior to Admission  Medication Sig Dispense Refill  . ALPRAZolam (XANAX) 0.25 MG tablet Take 0.25 mg by mouth 2 (two) times daily.       Marland Kitchen amLODipine (NORVASC) 5 MG tablet Take 5 mg by mouth 2 (two) times daily.      Marland Kitchen aspirin EC 81 MG tablet Take 81 mg by mouth every evening.       Marland Kitchen  atorvastatin (LIPITOR) 80 MG tablet Take 80 mg by mouth daily.      . carvedilol (COREG) 12.5 MG tablet Take 12.5 mg by mouth 2 (two) times daily with a meal.      . ferrous sulfate 325 (65 FE) MG EC tablet Take 1 tablet (325 mg total) by mouth 2 (two) times daily with a meal.  60 tablet  0  . fish oil-omega-3 fatty acids 1000 MG capsule Take 1 g by mouth daily.      . furosemide (LASIX) 40 MG tablet Take 2 tablets (80 mg total) by mouth daily as needed (if weight is above 173 lbs).  60 tablet  1  . gabapentin (NEURONTIN) 300 MG capsule Take 300 mg by mouth 2 (two) times daily.      Marland Kitchen HYDROcodone-acetaminophen (NORCO) 10-325 MG per tablet Take 1 tablet by mouth every 4 (four) hours as needed for  moderate pain.       Marland Kitchen insulin aspart (NOVOLOG) 100 UNIT/ML injection Inject 6 Units into the skin 3 (three) times daily before meals.      . Insulin Detemir (LEVEMIR FLEXPEN) 100 UNIT/ML SOPN Inject 40 Units into the skin every morning.       . Multiple Vitamin (MULTIVITAMIN WITH MINERALS) TABS Take 1 tablet by mouth daily.      . mycophenolate (CELLCEPT) 500 MG tablet Take 500 mg by mouth 2 (two) times daily.       . nitroGLYCERIN (NITROSTAT) 0.4 MG SL tablet Place 0.4 mg under the tongue every 5 (five) minutes as needed for chest pain. x3 doses as needed for chest pain      . predniSONE (DELTASONE) 5 MG tablet Take 5 mg by mouth 2 (two) times daily.      . primidone (MYSOLINE) 50 MG tablet Take 50 mg by mouth at bedtime.       . ranitidine (ZANTAC) 300 MG tablet Take 300 mg by mouth at bedtime.       Marland Kitchen tobramycin (TOBREX) 0.3 % ophthalmic solution Place 1 drop into both eyes as needed (for allergies).      . vitamin C (ASCORBIC ACID) 500 MG tablet Take 500 mg by mouth daily.        Results for orders placed during the hospital encounter of 10/07/13 (from the past 48 hour(s))  GLUCOSE, CAPILLARY     Status: None   Collection Time    10/07/13  6:28 PM      Result Value Range   Glucose-Capillary 70  70 - 99 mg/dL  COMPREHENSIVE METABOLIC PANEL     Status: Abnormal   Collection Time    10/07/13  7:02 PM      Result Value Range   Sodium 138  135 - 145 mEq/L   Potassium 4.8  3.5 - 5.1 mEq/L   Chloride 105  96 - 112 mEq/L   CO2 16 (*) 19 - 32 mEq/L   Glucose, Bld 68 (*) 70 - 99 mg/dL   BUN 161 (*) 6 - 23 mg/dL   Creatinine, Ser 0.96 (*) 0.50 - 1.35 mg/dL   Calcium 8.2 (*) 8.4 - 10.5 mg/dL   Total Protein 4.7 (*) 6.0 - 8.3 g/dL   Albumin 2.7 (*) 3.5 - 5.2 g/dL   AST 25  0 - 37 U/L   ALT 22  0 - 53 U/L   Alkaline Phosphatase 58  39 - 117 U/L   Total Bilirubin 0.3  0.3 - 1.2 mg/dL   GFR  calc non Af Amer 18 (*) >90 mL/min   GFR calc Af Amer 21 (*) >90 mL/min   Comment: (NOTE)     The  eGFR has been calculated using the CKD EPI equation.     This calculation has not been validated in all clinical situations.     eGFR's persistently <90 mL/min signify possible Chronic Kidney     Disease.  CBC WITH DIFFERENTIAL     Status: Abnormal   Collection Time    10/07/13  7:02 PM      Result Value Range   WBC 12.3 (*) 4.0 - 10.5 K/uL   RBC 2.59 (*) 4.22 - 5.81 MIL/uL   Hemoglobin 8.0 (*) 13.0 - 17.0 g/dL   HCT 16.1 (*) 09.6 - 04.5 %   MCV 93.1  78.0 - 100.0 fL   MCH 30.9  26.0 - 34.0 pg   MCHC 33.2  30.0 - 36.0 g/dL   RDW 40.9 (*) 81.1 - 91.4 %   Platelets 104 (*) 150 - 400 K/uL   Comment: PLATELET COUNT CONFIRMED BY SMEAR   Neutrophils Relative % 92 (*) 43 - 77 %   Neutro Abs 11.3 (*) 1.7 - 7.7 K/uL   Lymphocytes Relative 4 (*) 12 - 46 %   Lymphs Abs 0.4 (*) 0.7 - 4.0 K/uL   Monocytes Relative 4  3 - 12 %   Monocytes Absolute 0.5  0.1 - 1.0 K/uL   Eosinophils Relative 0  0 - 5 %   Eosinophils Absolute 0.0  0.0 - 0.7 K/uL   Basophils Relative 0  0 - 1 %   Basophils Absolute 0.0  0.0 - 0.1 K/uL  GLUCOSE, CAPILLARY     Status: None   Collection Time    10/07/13  7:28 PM      Result Value Range   Glucose-Capillary 77  70 - 99 mg/dL  TROPONIN I     Status: None   Collection Time    10/07/13  8:08 PM      Result Value Range   Troponin I <0.30  <0.30 ng/mL   Comment:            Due to the release kinetics of cTnI,     a negative result within the first hours     of the onset of symptoms does not rule out     myocardial infarction with certainty.     If myocardial infarction is still suspected,     repeat the test at appropriate intervals.  GLUCOSE, CAPILLARY     Status: Abnormal   Collection Time    10/07/13  8:47 PM      Result Value Range   Glucose-Capillary 144 (*) 70 - 99 mg/dL   Comment 1 Notify RN    GLUCOSE, CAPILLARY     Status: Abnormal   Collection Time    10/07/13 10:35 PM      Result Value Range   Glucose-Capillary 136 (*) 70 - 99 mg/dL  D-DIMER,  QUANTITATIVE     Status: Abnormal   Collection Time    10/08/13  1:00 AM      Result Value Range   D-Dimer, Quant 2.59 (*) 0.00 - 0.48 ug/mL-FEU   Comment:            AT THE INHOUSE ESTABLISHED CUTOFF     VALUE OF 0.48 ug/mL FEU,     THIS ASSAY HAS BEEN DOCUMENTED     IN THE LITERATURE TO HAVE  A SENSITIVITY AND NEGATIVE     PREDICTIVE VALUE OF AT LEAST     98 TO 99%.  THE TEST RESULT     SHOULD BE CORRELATED WITH     AN ASSESSMENT OF THE CLINICAL     PROBABILITY OF DVT / VTE.  GLUCOSE, CAPILLARY     Status: Abnormal   Collection Time    10/08/13  1:59 AM      Result Value Range   Glucose-Capillary 108 (*) 70 - 99 mg/dL  MRSA PCR SCREENING     Status: None   Collection Time    10/08/13  2:45 AM      Result Value Range   MRSA by PCR NEGATIVE  NEGATIVE   Comment:            The GeneXpert MRSA Assay (FDA     approved for NASAL specimens     only), is one component of a     comprehensive MRSA colonization     surveillance program. It is not     intended to diagnose MRSA     infection nor to guide or     monitor treatment for     MRSA infections.  GLUCOSE, CAPILLARY     Status: None   Collection Time    10/08/13  3:05 AM      Result Value Range   Glucose-Capillary 90  70 - 99 mg/dL   Comment 1 Notify RN    URINALYSIS, ROUTINE W REFLEX MICROSCOPIC     Status: Abnormal   Collection Time    10/08/13  3:06 AM      Result Value Range   Color, Urine YELLOW  YELLOW   APPearance CLOUDY (*) CLEAR   Specific Gravity, Urine 1.015  1.005 - 1.030   pH 5.0  5.0 - 8.0   Glucose, UA NEGATIVE  NEGATIVE mg/dL   Hgb urine dipstick NEGATIVE  NEGATIVE   Bilirubin Urine NEGATIVE  NEGATIVE   Ketones, ur NEGATIVE  NEGATIVE mg/dL   Protein, ur 478 (*) NEGATIVE mg/dL   Urobilinogen, UA 0.2  0.0 - 1.0 mg/dL   Nitrite NEGATIVE  NEGATIVE   Leukocytes, UA NEGATIVE  NEGATIVE  URINE MICROSCOPIC-ADD ON     Status: Abnormal   Collection Time    10/08/13  3:06 AM      Result Value Range    Squamous Epithelial / LPF RARE  RARE   Bacteria, UA MANY (*) RARE   Urine-Other AMORPHOUS URATES/PHOSPHATES    TROPONIN I     Status: None   Collection Time    10/08/13  4:20 AM      Result Value Range   Troponin I <0.30  <0.30 ng/mL   Comment:            Due to the release kinetics of cTnI,     a negative result within the first hours     of the onset of symptoms does not rule out     myocardial infarction with certainty.     If myocardial infarction is still suspected,     repeat the test at appropriate intervals.  CBC WITH DIFFERENTIAL     Status: Abnormal   Collection Time    10/08/13  4:20 AM      Result Value Range   WBC 10.8 (*) 4.0 - 10.5 K/uL   RBC 2.61 (*) 4.22 - 5.81 MIL/uL   Hemoglobin 7.8 (*) 13.0 - 17.0 g/dL   HCT 29.5 (*) 62.1 -  52.0 %   MCV 95.4  78.0 - 100.0 fL   MCH 29.9  26.0 - 34.0 pg   MCHC 31.3  30.0 - 36.0 g/dL   RDW 16.1 (*) 09.6 - 04.5 %   Platelets 124 (*) 150 - 400 K/uL   Neutrophils Relative % 91 (*) 43 - 77 %   Neutro Abs 9.8 (*) 1.7 - 7.7 K/uL   Lymphocytes Relative 4 (*) 12 - 46 %   Lymphs Abs 0.5 (*) 0.7 - 4.0 K/uL   Monocytes Relative 4  3 - 12 %   Monocytes Absolute 0.4  0.1 - 1.0 K/uL   Eosinophils Relative 1  0 - 5 %   Eosinophils Absolute 0.1  0.0 - 0.7 K/uL   Basophils Relative 0  0 - 1 %   Basophils Absolute 0.0  0.0 - 0.1 K/uL  COMPREHENSIVE METABOLIC PANEL     Status: Abnormal   Collection Time    10/08/13  4:20 AM      Result Value Range   Sodium 136  135 - 145 mEq/L   Potassium 4.9  3.5 - 5.1 mEq/L   Chloride 104  96 - 112 mEq/L   CO2 17 (*) 19 - 32 mEq/L   Glucose, Bld 83  70 - 99 mg/dL   BUN 409 (*) 6 - 23 mg/dL   Creatinine, Ser 8.11 (*) 0.50 - 1.35 mg/dL   Calcium 8.3 (*) 8.4 - 10.5 mg/dL   Total Protein 5.0 (*) 6.0 - 8.3 g/dL   Albumin 2.6 (*) 3.5 - 5.2 g/dL   AST 21  0 - 37 U/L   ALT 23  0 - 53 U/L   Alkaline Phosphatase 58  39 - 117 U/L   Total Bilirubin 0.2 (*) 0.3 - 1.2 mg/dL   GFR calc non Af Amer 17 (*) >90  mL/min   GFR calc Af Amer 20 (*) >90 mL/min   Comment: (NOTE)     The eGFR has been calculated using the CKD EPI equation.     This calculation has not been validated in all clinical situations.     eGFR's persistently <90 mL/min signify possible Chronic Kidney     Disease.  HEMOGLOBIN A1C     Status: Abnormal   Collection Time    10/08/13  4:20 AM      Result Value Range   Hemoglobin A1C 6.2 (*) <5.7 %   Comment: (NOTE)                                                                               According to the ADA Clinical Practice Recommendations for 2011, when     HbA1c is used as a screening test:      >=6.5%   Diagnostic of Diabetes Mellitus               (if abnormal result is confirmed)     5.7-6.4%   Increased risk of developing Diabetes Mellitus     References:Diagnosis and Classification of Diabetes Mellitus,Diabetes     Care,2011,34(Suppl 1):S62-S69 and Standards of Medical Care in             Diabetes - 2011,Diabetes BJYN,8295,62 (  Suppl 1):S11-S61.   Mean Plasma Glucose 131 (*) <117 mg/dL   Comment: Performed at [facility]  GLUCOSE, CAPILLARY     Status: None   Collection Time    10/08/13  8:00 AM      Result Value Range   Glucose-Capillary 87  70 - 99 mg/dL   Comment 1 Notify RN     Comment 2 Documented in Chart    TROPONIN I     Status: None   Collection Time    10/08/13  8:25 AM      Result Value Range   Troponin I <0.30  <0.30 ng/mL   Comment:            Due to the release kinetics of cTnI,     a negative result within the first hours     of the onset of symptoms does not rule out     myocardial infarction with certainty.     If myocardial infarction is still suspected,     repeat the test at appropriate intervals.  PRO B NATRIURETIC PEPTIDE     Status: Abnormal   Collection Time    10/08/13  8:25 AM      Result Value Range   Pro B Natriuretic peptide (BNP) 10879.0 (*) 0 - 125 pg/mL  GLUCOSE, CAPILLARY     Status: Abnormal   Collection  Time    10/08/13 11:35 AM      Result Value Range   Glucose-Capillary 148 (*) 70 - 99 mg/dL   Comment 1 Notify RN     Comment 2 Documented in Chart    CBC     Status: Abnormal   Collection Time    10/08/13 12:00 PM      Result Value Range   WBC 8.6  4.0 - 10.5 K/uL   RBC 2.48 (*) 4.22 - 5.81 MIL/uL   Hemoglobin 7.2 (*) 13.0 - 17.0 g/dL   HCT 16.1 (*) 09.6 - 04.5 %   MCV 94.4  78.0 - 100.0 fL   MCH 29.0  26.0 - 34.0 pg   MCHC 30.8  30.0 - 36.0 g/dL   RDW 40.9 (*) 81.1 - 91.4 %   Platelets 103 (*) 150 - 400 K/uL   Comment: PLATELET COUNT CONFIRMED BY SMEAR  COMPREHENSIVE METABOLIC PANEL     Status: Abnormal   Collection Time    10/08/13 12:00 PM      Result Value Range   Sodium 135  135 - 145 mEq/L   Potassium 5.1  3.5 - 5.1 mEq/L   Chloride 102  96 - 112 mEq/L   CO2 17 (*) 19 - 32 mEq/L   Glucose, Bld 149 (*) 70 - 99 mg/dL   BUN 782 (*) 6 - 23 mg/dL   Creatinine, Ser 9.56 (*) 0.50 - 1.35 mg/dL   Calcium 8.0 (*) 8.4 - 10.5 mg/dL   Total Protein 4.7 (*) 6.0 - 8.3 g/dL   Albumin 2.4 (*) 3.5 - 5.2 g/dL   AST 16  0 - 37 U/L   ALT 19  0 - 53 U/L   Alkaline Phosphatase 55  39 - 117 U/L   Total Bilirubin 0.2 (*) 0.3 - 1.2 mg/dL   GFR calc non Af Amer 17 (*) >90 mL/min   GFR calc Af Amer 20 (*) >90 mL/min   Comment: (NOTE)     The eGFR has been calculated using the CKD EPI equation.     This calculation has not  been validated in all clinical situations.     eGFR's persistently <90 mL/min signify possible Chronic Kidney     Disease.  PROTIME-INR     Status: None   Collection Time    10/08/13 12:00 PM      Result Value Range   Prothrombin Time 15.0  11.6 - 15.2 seconds   INR 1.21  0.00 - 1.49  APTT     Status: None   Collection Time    10/08/13 12:00 PM      Result Value Range   aPTT 29  24 - 37 seconds  TYPE AND SCREEN     Status: None   Collection Time    10/08/13  1:50 PM      Result Value Range   ABO/RH(D) A POS     Antibody Screen NEG     Sample Expiration  10/11/2013     Unit Number [id number]     Blood Component Type RED CELLS,LR     Unit division 00     Status of Unit REL FROM [facility]     Transfusion Status OK TO TRANSFUSE     Crossmatch Result Compatible     Unit Number [id number]     Blood Component Type RED CELLS,LR     Unit division 00     Status of Unit ALLOCATED     Transfusion Status OK TO TRANSFUSE     Crossmatch Result Compatible     Unit Number [id number]     Blood Component Type RED CELLS,LR     Unit division 00     Status of Unit ALLOCATED     Transfusion Status OK TO TRANSFUSE     Crossmatch Result Compatible    PREPARE RBC (CROSSMATCH)     Status: None   Collection Time    10/08/13  1:50 PM      Result Value Range   Order Confirmation ORDER PROCESSED BY BLOOD BANK    ABO/RH     Status: None   Collection Time    10/08/13  1:50 PM      Result Value Range   ABO/RH(D) A POS    TROPONIN I     Status: None   Collection Time    10/08/13  2:05 PM      Result Value Range   Troponin I <0.30  <0.30 ng/mL   Comment:            Due to the release kinetics of cTnI,     a negative result within the first hours     of the onset of symptoms does not rule out     myocardial infarction with certainty.     If myocardial infarction is still suspected,     repeat the test at appropriate intervals.  BLOOD GAS, ARTERIAL     Status: Abnormal   Collection Time    10/08/13  2:06 PM      Result Value Range   O2 Content 3.0     Delivery systems NASAL CANNULA     pH, Arterial 7.319 (*) 7.350 - 7.450   pCO2 arterial 35.0  35.0 - 45.0 mmHg   pO2, Arterial 96.5  80.0 - 100.0 mmHg   Bicarbonate 17.5 (*) 20.0 - 24.0 mEq/L   TCO2 18.5  0 - 100 mmol/L   Acid-base deficit 7.5 (*) 0.0 - 2.0 mmol/L   O2 Saturation 97.8     Patient temperature 98.6     Collection site RIGHT BRACHIAL  Drawn by COLLECTED BY NURSE     Sample type ARTERIAL     Allens test (pass/fail) PASS  PASS  FERRITIN     Status: Abnormal   Collection Time     10/08/13  2:06 PM      Result Value Range   Ferritin 524 (*) 22 - 322 ng/mL   Comment: Performed at [facility]  IRON AND TIBC     Status: Abnormal   Collection Time    10/08/13  2:06 PM      Result Value Range   Iron <10 (*) 42 - 135 ug/dL   TIBC Not calculated due to Iron <10.  215 - 435 ug/dL   Saturation Ratios Not calculated due to Iron <10.  20 - 55 %   UIBC 159  125 - 400 ug/dL   Comment: Performed at [facility]  BODY FLUID CULTURE     Status: None   Collection Time    10/08/13  2:50 PM      Result Value Range   Specimen Description FLUID PERICARDIAL     Special Requests NONE     Gram Stain       Value: RARE WBC PRESENT,BOTH PMN AND MONONUCLEAR     NO ORGANISMS SEEN     Performed at [facility]     Performed at [facility]   Culture       Value: NO GROWTH 1 DAY     Performed at [facility]   Report Status PENDING    GRAM STAIN     Status: None   Collection Time    10/08/13  2:50 PM      Result Value Range   Specimen Description FLUID PERICARDIAL     Special Requests NONE     Gram Stain       Value: RARE WBC PRESENT,BOTH PMN AND MONONUCLEAR     NO ORGANISMS SEEN   Report Status 10/08/2013 FINAL    BODY FLUID CELL COUNT WITH DIFFERENTIAL     Status: Abnormal   Collection Time    10/08/13  2:55 PM      Result Value Range   Fluid Type-FCT FLUID     Comment: PERICARDIAL   Color, Fluid AMBER (*) YELLOW   Appearance, Fluid HAZY (*) CLEAR   WBC, Fluid 56  0 - 1000 cu mm   Neutrophil Count, Fluid 28 (*) 0 - 25 %   Lymphs, Fluid 2     Monocyte-Macrophage-Serous Fluid 70  50 - 90 %  LACTATE DEHYDROGENASE, BODY FLUID     Status: Abnormal   Collection Time    10/08/13  2:55 PM      Result Value Range   LD, Fluid 155 (*) 3 - 23 U/L   Fluid Type-FLDH FLUID     Comment: PERICARDIAL  PROTEIN, BODY FLUID     Status: None   Collection Time    10/08/13  2:55 PM      Result Value Range   Total protein, fluid DUPLICATE  REQUEST     Comment: NO NORMAL RANGE ESTABLISHED FOR THIS TEST   Fluid Type-FTP FLUID     Comment: PERICARDIAL  PROTEIN, BODY FLUID     Status: None   Collection Time    10/08/13  2:55 PM      Result Value Range   Total protein, fluid 2.7     Comment: NO NORMAL RANGE ESTABLISHED FOR THIS TEST   Fluid Type-FTP  FLUID     Comment: PERICARDIAL  GLUCOSE, SEROUS FLUID     Status: None   Collection Time    10/08/13  2:55 PM      Result Value Range   Glucose, Fluid 105     Comment:            FLUID GLUCOSE LEVELS OF <60     mg/dL OR VALUES OF 40 mg/dL     LESS THAN A SIMULTANEOUS     SERUM LEVEL ARE CONSIDERED     DECREASED.   Fluid Type-FGLU FLUID     Comment: PERICARDIAL  GLUCOSE, CAPILLARY     Status: Abnormal   Collection Time    10/08/13  4:06 PM      Result Value Range   Glucose-Capillary 158 (*) 70 - 99 mg/dL  BLOOD GAS, ARTERIAL     Status: Abnormal   Collection Time    10/08/13  4:25 PM      Result Value Range   O2 Content 10.0     Delivery systems OXYGEN MASK     pH, Arterial 7.283 (*) 7.350 - 7.450   pCO2 arterial 36.8  35.0 - 45.0 mmHg   pO2, Arterial 137.0 (*) 80.0 - 100.0 mmHg   Bicarbonate 16.9 (*) 20.0 - 24.0 mEq/L   TCO2 18.0  0 - 100 mmol/L   Acid-base deficit 8.6 (*) 0.0 - 2.0 mmol/L   O2 Saturation 98.1     Patient temperature 98.6     Collection site A-LINE     Drawn by COLLECTED BY RT     Sample type ARTERIAL DRAW    GLUCOSE, CAPILLARY     Status: Abnormal   Collection Time    10/08/13  8:38 PM      Result Value Range   Glucose-Capillary 124 (*) 70 - 99 mg/dL  GLUCOSE, CAPILLARY     Status: None   Collection Time    10/09/13 12:19 AM      Result Value Range   Glucose-Capillary 94  70 - 99 mg/dL  BASIC METABOLIC PANEL     Status: Abnormal   Collection Time    10/09/13  5:00 AM      Result Value Range   Sodium 135  135 - 145 mEq/L   Potassium 4.9  3.5 - 5.1 mEq/L   Chloride 103  96 - 112 mEq/L   CO2 17 (*) 19 - 32 mEq/L   Glucose, Bld 94   70 - 99 mg/dL   BUN 960 (*) 6 - 23 mg/dL   Creatinine, Ser 4.54 (*) 0.50 - 1.35 mg/dL   Calcium 8.2 (*) 8.4 - 10.5 mg/dL   GFR calc non Af Amer 17 (*) >90 mL/min   GFR calc Af Amer 20 (*) >90 mL/min   Comment: (NOTE)     The eGFR has been calculated using the CKD EPI equation.     This calculation has not been validated in all clinical situations.     eGFR's persistently <90 mL/min signify possible Chronic Kidney     Disease.  CBC     Status: Abnormal   Collection Time    10/09/13  5:00 AM      Result Value Range   WBC 8.2  4.0 - 10.5 K/uL   RBC 2.33 (*) 4.22 - 5.81 MIL/uL   Hemoglobin 7.2 (*) 13.0 - 17.0 g/dL   HCT 09.8 (*) 11.9 - 14.7 %   MCV 94.4  78.0 - 100.0 fL  MCH 30.9  26.0 - 34.0 pg   MCHC 32.7  30.0 - 36.0 g/dL   RDW 16.1 (*) 09.6 - 04.5 %   Platelets 93 (*) 150 - 400 K/uL   Comment: CONSISTENT WITH PREVIOUS RESULT  PROTIME-INR     Status: Abnormal   Collection Time    10/09/13  5:00 AM      Result Value Range   Prothrombin Time 16.5 (*) 11.6 - 15.2 seconds   INR 1.37  0.00 - 1.49  GLUCOSE, CAPILLARY     Status: None   Collection Time    10/09/13  5:00 AM      Result Value Range   Glucose-Capillary 94  70 - 99 mg/dL  POCT I-STAT 3, BLOOD GAS (G3+)     Status: Abnormal   Collection Time    10/09/13  5:03 AM      Result Value Range   pH, Arterial 7.310 (*) 7.350 - 7.450   pCO2 arterial 33.0 (*) 35.0 - 45.0 mmHg   pO2, Arterial 71.0 (*) 80.0 - 100.0 mmHg   Bicarbonate 16.7 (*) 20.0 - 24.0 mEq/L   TCO2 18  0 - 100 mmol/L   O2 Saturation 93.0     Acid-base deficit 9.0 (*) 0.0 - 2.0 mmol/L   Patient temperature 98.0 F     Sample type ARTERIAL    GLUCOSE, CAPILLARY     Status: None   Collection Time    10/09/13  7:50 AM      Result Value Range   Glucose-Capillary 95  70 - 99 mg/dL   Comment 1 Documented in Chart     Comment 2 Notify RN     Dg Chest 2 View  10/07/2013   CLINICAL DATA:  Altered mental status  EXAM: CHEST  2 VIEW  COMPARISON:  04/11/2013   FINDINGS: Severe cardiac enlargement. This is stable. Pacer is unchanged. Lungs are clear.  IMPRESSION: No active cardiopulmonary disease. Stable severe cardiac enlargement.   Electronically Signed   By: Esperanza Heir M.D.   On: 10/07/2013 21:24   Dg Chest Portable 1 View  10/08/2013   CLINICAL DATA:  Postop for central line placement. Renal transplant.  EXAM: PORTABLE CHEST - 1 VIEW  COMPARISON:  10/07/2013  FINDINGS: Pacer/AICD device which is unchanged.  Right internal jugular line which terminates at the mid to low SVC.  Midline trachea. Cardiomegaly accentuated by AP portable technique. Small left and possible small right pleural effusions. No pneumothorax. Diminished lung volumes. Development of mild pulmonary venous congestion. Bibasilar left greater than right airspace disease. Similar, given differences in technique.  IMPRESSION: Right IJ central line terminating at mid to low SVC, without pneumothorax.  Worsened aeration, with development of mild pulmonary venous congestion and decreased lung volumes.  Similar small left and possible right pleural effusions with adjacent atelectasis or infection.   Electronically Signed   By: Jeronimo Greaves M.D.   On: 10/08/2013 17:50    Review of Systems  Constitutional: Positive for weight loss. Negative for fever.  Respiratory: Positive for shortness of breath.   Cardiovascular: Positive for chest pain.  Gastrointestinal: Positive for nausea. Negative for vomiting and abdominal pain.  Neurological: Positive for weakness. Negative for dizziness.    Blood pressure 109/58, pulse 78, temperature 97.9 F (36.6 C), temperature source Oral, resp. rate 17, height 6\' 2"  (1.88 m), weight 191 lb 9.3 oz (86.9 kg), SpO2 96.00%. Physical Exam  Constitutional: He is oriented to person, place, and time. He appears  well-developed.  Cardiovascular: Normal rate.   Murmur heard. Respiratory: Effort normal and breath sounds normal. He has no wheezes.  GI: Soft. Bowel  sounds are normal. There is no tenderness.  Musculoskeletal: Normal range of motion.  Neurological: He is alert and oriented to person, place, and time.  Skin: Skin is warm and dry.  Psychiatric: He has a normal mood and affect. His behavior is normal. Judgment and thought content normal.     Assessment/Plan Hx renal tx 1974- renal ca- renal failure Now Acute on chronic kidney disease Pericardial effusion- window 11/24- 1.5 Liters yellow fluid Slight improvement renal fxn yesterday/today Scheduled for temp dialysis catheter in am per Dr Allena Katz Pt aware of procedure benefits and risks and agreeable to proceed Consent signed and in chart   Judah Chevere A 10/09/2013, 8:43 AM

## 2013-10-09 NOTE — Progress Notes (Signed)
[facility] DAILY ICU PROGRESS NOTE                   [address]            [name] [address]          [phone]   1 Day Post-Op Procedure(s) (LRB): SUBXYPHOID PERICARDIAL WINDOW (N/A)  Total Length of Stay:  LOS: 2 days   Subjective: Feels better today after drainage of 1.5 ml of pericardial fluid  Objective: Vital signs in last 24 hours: Temp:  [97.8 F (36.6 C)-98.8 F (37.1 C)] 97.9 F (36.6 C) (11/25 0754) Pulse Rate:  [74-86] 78 (11/25 0600) Cardiac Rhythm:  [-] Ventricular paced (11/25 0000) Resp:  [13-25] 17 (11/25 0600) BP: (109-134)/(55-71) 109/58 mmHg (11/25 0400) SpO2:  [92 %-99 %] 96 % (11/25 0600) Arterial Line BP: (105-151)/(38-78) 105/38 mmHg (11/25 0600) Weight:  [191 lb 9.3 oz (86.9 kg)-192 lb (87.091 kg)] 191 lb 9.3 oz (86.9 kg) (11/25 0500)  Filed Weights   10/08/13 0258 10/08/13 1800 10/09/13 0500  Weight: 198 lb 6.6 oz (90 kg) 192 lb (87.091 kg) 191 lb 9.3 oz (86.9 kg)    Weight change: -6 lb 6.6 oz (-2.909 kg)   Hemodynamic parameters for last 24 hours:    Intake/Output from previous day: 11/24 0701 - 11/25 0700 In: 1480 [P.O.:360; I.V.:1070; IV Piggyback:50] Out: 2555 [Urine:2085; Blood:20; Chest Tube:450]  Intake/Output this shift:    Current Meds: Scheduled Meds: . acetaminophen  1,000 mg Oral Q6H   Or  . acetaminophen (TYLENOL) oral liquid 160 mg/5 mL  1,000 mg Oral Q6H  . ALPRAZolam  0.25 mg Oral BID  . amLODipine  5 mg Oral BID  . bisacodyl  10 mg Oral Daily  . carvedilol  12.5 mg Oral BID WC  . cefUROXime (ZINACEF)  IV  1.5 g Intravenous Q12H  . famotidine  20 mg Oral Daily  . ferrous sulfate  325 mg Oral BID WC  . gabapentin  300 mg Oral BID  . hydrocortisone sodium succinate  50 mg Intravenous Q6H  . insulin aspart  0-24 Units Subcutaneous Q6H  . insulin aspart  0-9 Units Subcutaneous Q4H  . mycophenolate  500 mg Oral BID  . pantoprazole  40 mg Oral Daily  . sodium chloride  3 mL Intravenous Q12H    Continuous Infusions:  PRN Meds:.sodium chloride, sodium chloride, sodium chloride, dextrose, feeding supplement (NEPRO CARB STEADY), fentaNYL, glucose-Vitamin C, heparin, HYDROcodone-acetaminophen, lidocaine (PF), lidocaine-prilocaine, ondansetron (ZOFRAN) IV, ondansetron (ZOFRAN) IV, pentafluoroprop-tetrafluoroeth, potassium chloride, senna-docusate, sodium chloride, tobramycin  General appearance: alert, cooperative and appears older than stated age Neurologic: intact Heart: regular rate and rhythm, S1, S2 normal, no murmur, click, rub or gallop Lungs: diminished breath sounds bibasilar Abdomen: soft, non-tender; bowel sounds normal; no masses,  no organomegaly Extremities: numerious areas of skin break down and skin tears chronic Wound: pericardial tube in place  Lab Results: CBC: Recent Labs  10/08/13 1200 10/09/13 0500  WBC 8.6 8.2  HGB 7.2* 7.2*  HCT 23.4* 22.0*  PLT 103* 93*   BMET:  Recent Labs  10/08/13 1200 10/09/13 0500  NA 135 135  K 5.1 4.9  CL 102 103  CO2 17* 17*  GLUCOSE 149* 94  BUN 114* 107*  CREATININE 3.43* 3.41*  CALCIUM 8.0* 8.2*    PT/INR:  Recent Labs  10/09/13 0500  LABPROT 16.5*  INR 1.37   Radiology: Dg Chest 2 View  10/07/2013   CLINICAL DATA:  Altered mental status  EXAM: CHEST  2 VIEW  COMPARISON:  04/11/2013  FINDINGS: Severe cardiac enlargement. This is stable. Pacer is unchanged. Lungs are clear.  IMPRESSION: No active cardiopulmonary disease. Stable severe cardiac enlargement.   Electronically Signed   By: Esperanza Heir M.D.   On: 10/07/2013 21:24   Dg Chest Portable 1 View  10/08/2013   CLINICAL DATA:  Postop for central line placement. Renal transplant.  EXAM: PORTABLE CHEST - 1 VIEW  COMPARISON:  10/07/2013  FINDINGS: Pacer/AICD device which is unchanged.  Right internal jugular line which terminates at the mid to low SVC.  Midline trachea. Cardiomegaly accentuated by AP portable technique. Small left and possible small  right pleural effusions. No pneumothorax. Diminished lung volumes. Development of mild pulmonary venous congestion. Bibasilar left greater than right airspace disease. Similar, given differences in technique.  IMPRESSION: Right IJ central line terminating at mid to low SVC, without pneumothorax.  Worsened aeration, with development of mild pulmonary venous congestion and decreased lung volumes.  Similar small left and possible right pleural effusions with adjacent atelectasis or infection.   Electronically Signed   By: Jeronimo Greaves M.D.   On: 10/08/2013 17:50     Assessment/Plan: S/P Procedure(s) (LRB): SUBXYPHOID PERICARDIAL WINDOW (N/A) leave pericardial tube today d/c tomorrow after dialysis cath placed Continued care by renal, critical care     Harman Ferrin B 10/09/2013 9:18 AM

## 2013-10-09 NOTE — Progress Notes (Signed)
Patient ID: [name], male   DOB: Mar 03, 1946, 67 y.o.   MRN: [id number]    SUBJECTIVE:  Patient is resting this morning after his pericardial window was placed yesterday. His rhythm is paced.   Filed Vitals:   10/09/13 0300 10/09/13 0400 10/09/13 0500 10/09/13 0600  BP:  109/58    Pulse: 79 79 77 78  Temp:      TempSrc:      Resp: 19 25 25 17   Height:      Weight:   191 lb 9.3 oz (86.9 kg)   SpO2: 97% 97% 98% 96%     Intake/Output Summary (Last 24 hours) at 10/09/13 0752 Last data filed at 10/09/13 0600  Gross per 24 hour  Intake   1480 ml  Output   2555 ml  Net  -1075 ml    LABS: Basic Metabolic Panel:  Recent Labs  40/98/11 1200 10/09/13 0500  NA 135 135  K 5.1 4.9  CL 102 103  CO2 17* 17*  GLUCOSE 149* 94  BUN 114* 107*  CREATININE 3.43* 3.41*  CALCIUM 8.0* 8.2*   Liver Function Tests:  Recent Labs  10/08/13 0420 10/08/13 1200  AST 21 16  ALT 23 19  ALKPHOS 58 55  BILITOT 0.2* 0.2*  PROT 5.0* 4.7*  ALBUMIN 2.6* 2.4*   No results found for this basename: LIPASE, AMYLASE,  in the last 72 hours CBC:  Recent Labs  10/07/13 1902 10/08/13 0420 10/08/13 1200 10/09/13 0500  WBC 12.3* 10.8* 8.6 8.2  NEUTROABS 11.3* 9.8*  --   --   HGB 8.0* 7.8* 7.2* 7.2*  HCT 24.1* 24.9* 23.4* 22.0*  MCV 93.1 95.4 94.4 94.4  PLT 104* 124* 103* 93*   Cardiac Enzymes:  Recent Labs  10/08/13 0420 10/08/13 0825 10/08/13 1405  TROPONINI <0.30 <0.30 <0.30   BNP: No components found with this basename: POCBNP,  D-Dimer:  Recent Labs  10/08/13 0100  DDIMER 2.59*   Hemoglobin A1C:  Recent Labs  10/08/13 0420  HGBA1C 6.2*   Fasting Lipid Panel: No results found for this basename: CHOL, HDL, LDLCALC, TRIG, CHOLHDL, LDLDIRECT,  in the last 72 hours Thyroid Function Tests: No results found for this basename: TSH, T4TOTAL, FREET3, T3FREE, THYROIDAB,  in the last 72 hours  RADIOLOGY: Dg Chest 2 View  10/07/2013   CLINICAL DATA:  Altered mental  status  EXAM: CHEST  2 VIEW  COMPARISON:  04/11/2013  FINDINGS: Severe cardiac enlargement. This is stable. Pacer is unchanged. Lungs are clear.  IMPRESSION: No active cardiopulmonary disease. Stable severe cardiac enlargement.   Electronically Signed   By: Esperanza Heir M.D.   On: 10/07/2013 21:24   Dg Chest Portable 1 View  10/08/2013   CLINICAL DATA:  Postop for central line placement. Renal transplant.  EXAM: PORTABLE CHEST - 1 VIEW  COMPARISON:  10/07/2013  FINDINGS: Pacer/AICD device which is unchanged.  Right internal jugular line which terminates at the mid to low SVC.  Midline trachea. Cardiomegaly accentuated by AP portable technique. Small left and possible small right pleural effusions. No pneumothorax. Diminished lung volumes. Development of mild pulmonary venous congestion. Bibasilar left greater than right airspace disease. Similar, given differences in technique.  IMPRESSION: Right IJ central line terminating at mid to low SVC, without pneumothorax.  Worsened aeration, with development of mild pulmonary venous congestion and decreased lung volumes.  Similar small left and possible right pleural effusions with adjacent atelectasis or infection.   Electronically Signed  By: Jeronimo Greaves M.D.   On: 10/08/2013 17:50    PHYSICAL EXAM  patient is resting. Cardiac exam reveals S1-S2. Lungs reveal scattered rhonchi.   TELEMETRY: I reviewed telemetry today October 09, 2013. There is a paced rhythm that is stable. No significant arrhythmias.   ASSESSMENT AND PLAN:    History of renal transplant   HTN (hypertension)   Diabetes mellitus type 2, controlled    CAD (coronary artery disease)        Coronary disease is stable at this time. No further workup recommended.    Renal failure (ARF), acute on chronic kidney disease stage 3       There is a history of renal transplant. Patient also has severe recurrent renal disease at this time. Nephrology is following him carefully and helping  with choice of medicines including diuretics.    Acute respiratory failure with hypoxia         This has improved since admission. He is now post op    Pericardial effusion     Patient has had a pericardial effusion for prolonged period of time. It appeared to be getting larger. It was felt most prudent to proceed with pericardial window. This was done yesterday. Hopefully this will help with his overall status in terms of stabilizing his renal function and volume status.    Anemia in chronic kidney disease   Hypoglycemia    Cardiomyopathy, ischemic     Ejection fraction currently is in the range of 40-45%. Patient is on appropriate medications that can be used in this setting.  The patient's overall cardiac status and ICD are followed by Dr. Lady Gary of the [facility] in [place].    Willa Rough 10/09/2013 7:52 AM

## 2013-10-09 NOTE — Op Note (Signed)
[name]  LAINE, GIOVANETTI NO.:  [id number]  MEDICAL RECORD NO.:  [id number]  LOCATION:  2S08C                        FACILITY:  [facility]  PHYSICIAN:  Sheliah Plane, MD    DATE OF BIRTH:  15-Jun-1946  DATE OF PROCEDURE:  10/08/2013 DATE OF DISCHARGE:                              OPERATIVE REPORT   PREOPERATIVE DIAGNOSIS:  Large pericardial effusion.  POSTOPERATIVE DIAGNOSIS:  Large pericardial effusion.  PROCEDURE PERFORMED:  Subxiphoid pericardial window and drainage of pericardial effusion, and TEE.  SURGEON:  Sheliah Plane, MD.  FIRST ASSISTANT:  Doree Fudge, PA-C.  BRIEF HISTORY:  The patient is a 67 year old male with longstanding renal transplant of almost 40 years, who presents with hypoglycemia and increasing shortness of breath.  He has had a several recent admissions and visits 2 days before admission to [facility] Room, and was discharged home.  He now presents with a chronic very large pleural effusion with increasing symptoms, progressive failure of his transplanted kidney.  He was seen urgently at the request of Cardiology because of the pericardial effusion.  Subxiphoid pericardial window and drainage, both for therapeutic and diagnostic reasons was recommended to the patient, who agreed and signed informed consent.  DESCRIPTION OF PROCEDURE:  The patient underwent general endotracheal anesthesia without incident.  The skin of the chest was prepped with Betadine and draped in sterile manner.  Appropriate timeout was performed.  A small subxiphoid incision was made, and carried down to the pericardium which was opened, 1.5 L of straw-colored fluid was removed.  There were no obvious loculations within the space, and the fluid was completely removed.  A cell count, chemistries, cultures, and cytology on the fluid were all sent.  A 28 drain was left in the posterior pericardium, brought out through a separate site adjacent to the  incision.  The incision was then made.  A portion of anterior pericardium was excised and also submitted to Pathology.  The fascia was then closed with interrupted 0 Vicryl, running 2-0 Vicryl subcutaneous tissue, and 4-0 subcuticular stitch in skin edge.  Dermabond was applied.  The patient was awakened in the operating room, and transferred to the recovery room for further postoperative care.  He tolerated the procedure without obvious complication.  Sponge and needle count was reported as correct.  Blood loss was minimal.    Sheliah Plane, MD    EG/MEDQ  D:  10/09/2013  T:  10/09/2013  Job:  [id number]

## 2013-10-10 ENCOUNTER — Inpatient Hospital Stay (HOSPITAL_COMMUNITY): Payer: Medicare Other

## 2013-10-10 ENCOUNTER — Encounter (HOSPITAL_COMMUNITY): Payer: Self-pay | Admitting: Cardiothoracic Surgery

## 2013-10-10 DIAGNOSIS — N184 Chronic kidney disease, stage 4 (severe): Secondary | ICD-10-CM

## 2013-10-10 DIAGNOSIS — N19 Unspecified kidney failure: Secondary | ICD-10-CM

## 2013-10-10 DIAGNOSIS — I2589 Other forms of chronic ischemic heart disease: Secondary | ICD-10-CM

## 2013-10-10 LAB — COMPREHENSIVE METABOLIC PANEL
ALT: 15 U/L (ref 0–53)
Albumin: 2 g/dL — ABNORMAL LOW (ref 3.5–5.2)
Alkaline Phosphatase: 52 U/L (ref 39–117)
BUN: 112 mg/dL — ABNORMAL HIGH (ref 6–23)
CO2: 16 mEq/L — ABNORMAL LOW (ref 19–32)
Calcium: 8.4 mg/dL (ref 8.4–10.5)
Creatinine, Ser: 3.65 mg/dL — ABNORMAL HIGH (ref 0.50–1.35)
GFR calc Af Amer: 19 mL/min — ABNORMAL LOW (ref >90)
GFR calc non Af Amer: 16 mL/min — ABNORMAL LOW (ref >90)
Glucose, Bld: 203 mg/dL — ABNORMAL HIGH (ref 70–99)
Sodium: 134 mEq/L — ABNORMAL LOW (ref 135–145)

## 2013-10-10 LAB — GLUCOSE, CAPILLARY
Glucose-Capillary: 196 mg/dL — ABNORMAL HIGH (ref 70–99)
Glucose-Capillary: 192 mg/dL — ABNORMAL HIGH (ref 70–99)

## 2013-10-10 LAB — CBC
HCT: 21.8 % — ABNORMAL LOW (ref 39.0–52.0)
Hemoglobin: 7 g/dL — ABNORMAL LOW (ref 13.0–17.0)
MCH: 29.8 pg (ref 26.0–34.0)
MCHC: 32.1 g/dL (ref 30.0–36.0)
Platelets: 95 10*3/uL — ABNORMAL LOW (ref 150–400)
RBC: 2.35 MIL/uL — ABNORMAL LOW (ref 4.22–5.81)

## 2013-10-10 MED ORDER — MIDAZOLAM HCL 2 MG/2ML IJ SOLN
INTRAMUSCULAR | Status: AC | PRN
  Administered 2013-10-10 (×2): 1 mg via INTRAVENOUS

## 2013-10-10 MED ORDER — MIDAZOLAM HCL 2 MG/2ML IJ SOLN
INTRAMUSCULAR | Status: AC
  Filled 2013-10-10: qty 4

## 2013-10-10 MED ORDER — GABAPENTIN 300 MG PO CAPS
300.0000 mg | ORAL_CAPSULE | Freq: Every day | ORAL | Status: DC
  Administered 2013-10-10 – 2013-10-16 (×7): 300 mg via ORAL
  Filled 2013-10-10 (×9): qty 1

## 2013-10-10 MED ORDER — SODIUM CHLORIDE 0.9 % IV SOLN
1020.0000 mg | Freq: Once | INTRAVENOUS | Status: AC
  Administered 2013-10-10: 1020 mg via INTRAVENOUS
  Filled 2013-10-10: qty 34

## 2013-10-10 MED ORDER — HEPARIN SODIUM (PORCINE) 1000 UNIT/ML IJ SOLN
INTRAMUSCULAR | Status: AC
  Filled 2013-10-10: qty 1

## 2013-10-10 MED ORDER — SODIUM CHLORIDE 0.9 % IV SOLN
1020.0000 mg | Freq: Once | INTRAVENOUS | Status: AC
  Filled 2013-10-10: qty 34

## 2013-10-10 MED ORDER — FENTANYL CITRATE 0.05 MG/ML IJ SOLN
INTRAMUSCULAR | Status: AC | PRN
  Administered 2013-10-10 (×2): 50 ug via INTRAVENOUS

## 2013-10-10 MED ORDER — DARBEPOETIN ALFA-POLYSORBATE 60 MCG/0.3ML IJ SOLN
60.0000 ug | INTRAMUSCULAR | Status: DC
  Administered 2013-10-10: 60 ug via INTRAVENOUS
  Filled 2013-10-10: qty 0.3

## 2013-10-10 MED ORDER — FENTANYL CITRATE 0.05 MG/ML IJ SOLN
INTRAMUSCULAR | Status: AC
  Filled 2013-10-10: qty 4

## 2013-10-10 NOTE — Procedures (Signed)
Successful LT IJ TEMP TRIALYSIS CATH NO COMP STABLE TIP SVC/RA READY FOR USE

## 2013-10-10 NOTE — Progress Notes (Signed)
Right  Upper Extremity Vein Map Study was technically limited due to multiple bandage sites, IV placement, and decreased acoustic transmission secondary to skin lesions.   Cephalic  Segment Diameter Depth Comment  1. Axilla 2.20mm    2. Mid upper arm   Unable to visualize  3. Above [facility]   Unable to visualize  4. In [facility]   Unable to visualize  5. Below AC 3.69mm    6. Mid forearm 2.35mm    7. Wrist   Unable to visualize                   Left Upper Extremity Vein Map    Cephalic  Segment Diameter Depth Comment  1. Axilla 2.41mm    2. Mid upper arm 2.16mm    3. Above AC 3.28mm    4. In [facility] 3.52mm    5. Below AC   Unable to visualize  6. Mid forearm   Unable to visualize  7. Wrist   Unable to visualize                   10/10/2013 2:49 PM Gertie Fey, RVT, RDCS, RDMS

## 2013-10-10 NOTE — Progress Notes (Signed)
Inpatient Diabetes Program Recommendations  AACE/ADA: New Consensus Statement on Inpatient Glycemic Control (2013)  Target Ranges:  Prepandial:   less than 140 mg/dL      Peak postprandial:   less than 180 mg/dL (1-2 hours)      Critically ill patients:  140 - 180 mg/dL   Reason for Visit: Results for MUSAB, WINGARD (MRN [id number]) as of 10/10/2013 09:45  Ref. Range 10/09/2013 11:17 10/09/2013 15:52 10/09/2013 17:03 10/09/2013 22:36 10/10/2013 07:57  Glucose-Capillary Latest Range: 70-99 mg/dL 409 (H) 811 (H) 914 (H) 178 (H) 207 (H)   Please restart Levemir 8 units daily (Note that patient was on Levemir 40 units daily at home).    Thanks, Beryl Meager, RN, BC-ADM Inpatient Diabetes Coordinator Pager [phone]

## 2013-10-10 NOTE — Consult Note (Signed)
[name] NOTE   MRN : [id number]  Reason for Consult: ESRD Referring Physician: Dr. Allena Katz  History of Present Illness: 67 y.o. Male with acute on chronic renal disease stage IV.  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  He had an MI about 4 years ago and was placed on temporary dialysis and fistula creation right forearm that no longer works and was never used for dialysis.  We have been ask to consult for permanent dialysis access.   Medical history includes Myocardial infarction (04/12/1990); CHF (congestive heart failure) (01/2012); Hypertension; Diabetes mellitus; ICD (implantable cardiac defibrillator) in place; Pacemaker (02/15/2012); Pneumonia (2011); Arthritis; History of blood transfusion; Chronic kidney disease; Dysrhythmia; Cancer; Right foot drop; and Cellulitis.  He is on Lipitor, coreg, Insulin and Asprin.           Current Facility-Administered Medications  Medication Dose Route Frequency Provider Last Rate Last Dose  . 0.9 %  sodium chloride infusion  250 mL Intravenous PRN Therisa Doyne, MD 20 mL/hr at 10/10/13 0100 250 mL at 10/10/13 0100  . 0.9 %  sodium chloride infusion  100 mL Intravenous PRN Jay K. Allena Katz, MD      . 0.9 %  sodium chloride infusion  100 mL Intravenous PRN Vonna Kotyk K. Allena Katz, MD      . acetaminophen (TYLENOL) tablet 650 mg  650 mg Oral Q6H PRN Lonia Farber, MD      . ALPRAZolam Prudy Feeler) tablet 0.25 mg  0.25 mg Oral BID Therisa Doyne, MD   0.25 mg at 10/09/13 2224  . amLODipine (NORVASC) tablet 5 mg  5 mg Oral BID Jeanella Craze, NP   5 mg at 10/09/13 2224  . bisacodyl (DULCOLAX) EC tablet 10 mg  10 mg Oral Daily Donielle Margaretann Loveless, PA-C   10 mg at 10/09/13 1045  . carvedilol (COREG) tablet 12.5 mg  12.5 mg Oral BID WC Donielle Margaretann Loveless, PA-C   12.5 mg at 10/10/13 0800  . darbepoetin (ARANESP) injection 60 mcg  60 mcg Intravenous Q Wed-HD Hartley Barefoot. Allena Katz, MD   60 mcg at 10/10/13 4098  . famotidine  (PEPCID) tablet 20 mg  20 mg Oral Daily Therisa Doyne, MD   20 mg at 10/09/13 1045  . feeding supplement (NEPRO CARB STEADY) liquid 237 mL  237 mL Oral PRN Vonna Kotyk K. Allena Katz, MD      . fentaNYL (SUBLIMAZE) 0.05 MG/ML injection           . gabapentin (NEURONTIN) capsule 300 mg  300 mg Oral QHS Jay K. Allena Katz, MD      . heparin 1000 UNIT/ML injection           . heparin injection 1,000 Units  1,000 Units Dialysis PRN Hartley Barefoot. Allena Katz, MD      . hydrocortisone sodium succinate (SOLU-CORTEF) 100 mg/2 mL injection 50 mg  50 mg Intravenous Q6H Nelda Bucks, MD   50 mg at 10/10/13 0310  . insulin aspart (novoLOG) injection 0-15 Units  0-15 Units Subcutaneous TID WC Lonia Farber, MD   5 Units at 10/10/13 0800  . insulin aspart (novoLOG) injection 0-5 Units  0-5 Units Subcutaneous QHS Konstantin Zubelevitskiy, MD      . lidocaine (PF) (XYLOCAINE) 1 % injection 5 mL  5 mL Intradermal PRN Jay K. Allena Katz, MD      . lidocaine-prilocaine (EMLA) cream 1 application  1 application Topical PRN Vonna Kotyk K. Allena Katz, MD      .  midazolam (VERSED) 2 MG/2ML injection           . mycophenolate (CELLCEPT) capsule 500 mg  500 mg Oral BID Therisa Doyne, MD   500 mg at 10/10/13 1610  . ondansetron (ZOFRAN) injection 4 mg  4 mg Intravenous Q6H PRN Therisa Doyne, MD      . ondansetron (ZOFRAN) injection 4 mg  4 mg Intravenous Q6H PRN Donielle Margaretann Loveless, PA-C      . oxyCODONE (Oxy IR/ROXICODONE) immediate release tablet 5 mg  5 mg Oral Q4H PRN Lonia Farber, MD   5 mg at 10/09/13 2301  . pantoprazole (PROTONIX) EC tablet 40 mg  40 mg Oral Daily Donielle Margaretann Loveless, PA-C   40 mg at 10/09/13 1044  . pentafluoroprop-tetrafluoroeth (GEBAUERS) aerosol 1 application  1 application Topical PRN Jay K. Allena Katz, MD      . potassium chloride 10 mEq in 50 mL *CENTRAL LINE* IVPB  10 mEq Intravenous Daily PRN Donielle Margaretann Loveless, PA-C      . predniSONE (DELTASONE) tablet 5 mg  5 mg Oral BID WC Lonia Farber, MD   5 mg at 10/10/13 0800  . senna-docusate (Senokot-S) tablet 1 tablet  1 tablet Oral QHS PRN Ardelle Balls, PA-C      . sodium chloride 0.9 % injection 3 mL  3 mL Intravenous Q12H Therisa Doyne, MD   3 mL at 10/09/13 2225  . sodium chloride 0.9 % injection 3 mL  3 mL Intravenous PRN Therisa Doyne, MD      . tobramycin (TOBREX) 0.3 % ophthalmic solution 1 drop  1 drop Both Eyes PRN Therisa Doyne, MD        Pt meds include: Statin :Yes Betablocker: Yes ASA: Yes Other anticoagulants/antiplatelets: None  Past Medical History  Diagnosis Date  . CAD (coronary artery disease)     a. 03/1990 - s/p MI and PTCA  . Systolic congestive heart failure 01/2012    a. 09/2013 Echo: EF 40-45%, inf/infsept & post HK, sev LVH, mod reduced RV fxn, PASP , large pericardial eff - 39 mm in maximal dimension, no RV collapse, dilated IVC w/o resp variation.  . Hypertension   . Diabetes mellitus   . ICD (implantable cardiac defibrillator) in place     a. BSX 02/2012, in setting of cardiac arrest.  . History of pneumonia 2011  . Arthritis   . History of blood transfusion   . CKD (chronic kidney disease), stage IV     a. 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 Cancer     a. Bladder Cancer- 2004;  b. Skin Cancer- Basal, Squamous, and a few melanoma  . Right foot drop   . Cellulitis   . Pericardial effusion     a. 09/2013 Echo: large pericardial eff - 39 mm in maximal dimension, no RV collapse, dilated IVC w/o resp variation.    Past Surgical History  Procedure Laterality Date  . Kidney transplant  02/13/1973  . Coronary angioplasty  03/1990  . Skin cancer excision      Numerous  . Eye surgery      Cataract Right Eye  . Insert / replace / remove pacemaker    . Uretheral transplation      Left to Right (transplanted kidney)  . Skin transplant      Left thigh to Left hand  . Knee arthroscopy  07/25/2012    Procedure: ARTHROSCOPY KNEE;   Surgeon: Cammy Copa, MD;  Location: [facility]  OR;  Service: Orthopedics;  Laterality: Left;  Left knee arthroscopy, debridement, cultures  . Lumbar laminectomy/decompression microdiscectomy Bilateral 03/26/2013    Procedure: LUMBAR LAMINECTOMY/DECOMPRESSION MICRODISCECTOMY 1 LEVEL;  Surgeon: Reinaldo Meeker, MD;  Location: [facility]UR[facility] ORS;  Service: Neurosurgery;  Laterality: Bilateral;  . Lumbar laminectomy/decompression microdiscectomy N/A 04/19/2013    Procedure: Redo Lumbar Three to Four Decompression One LEVEL;  Surgeon: Cristi Loron, MD;  Location: [facility] OR[facility];  Service: Neurosurgery;  Laterality: N/A;  Redo Lumbar Three to Four Decompression One LEVEL    Social History History  Substance Use Topics  . Smoking status: Former Smoker -- 30 years    Quit date: 02/14/1983  . Smokeless tobacco: Never Used  . Alcohol Use: No    Family History Family History  Problem Relation Age of Onset  . Diabetes Mellitus II Mother   . Lung cancer Father     No Known Allergies   REVIEW OF SYSTEMS  General: [ ]  Weight loss, [ ]  Fever, [ ]  chills Neurologic: [ ]  Dizziness, [ ]  Blackouts, [ ]  Seizure [ ]  Stroke, [ ]  "Mini stroke", [ ]  Slurred speech, [ ]  Temporary blindness; [ ]  weakness in arms or legs, [ ]  Hoarseness [ ]  Dysphagia Cardiac: [ ]  Chest pain/pressure, [x ] Shortness of breath at rest [x ] Shortness of breath with exertion, [ ]  Atrial fibrillation or irregular heartbeat  Vascular: [ ]  Pain in legs with walking, [ ]  Pain in legs at rest, [ ]  Pain in legs at night,  [ ]  Non-healing ulcer, [ ]  Blood clot in vein/DVT,   Pulmonary: [ ]  Home oxygen, [ ]  Productive cough, [ ]  Coughing up blood, [ ]  Asthma,  [ ]  Wheezing [ ]  COPD Musculoskeletal:  [ ]  Arthritis, [ ]  Low back pain, [ ]  Joint pain Hematologic: [ ]  Easy Bruising, [ ]  Anemia; [ ]  Hepatitis Gastrointestinal: [ ]  Blood in stool, [ ]  Gastroesophageal Reflux/heartburn, Urinary: [x ] chronic Kidney disease, [ ]  on HD - [ ]  MWF or  [ ]  TTHS, [ ]  Burning with urination, [ ]  Difficulty urinating Skin: [x ] Rashes, [x ] Wounds multiple areas of skin cancer wounds and chronic wounds secondary to immunosuppressive medications. Psychological: [ ]  Anxiety, [ ]  Depression  Physical Examination Filed Vitals:   10/10/13 0800 10/10/13 0920 10/10/13 0925 10/10/13 0930  BP: 131/58 150/67 137/64 140/66  Pulse: 81 76 78 77  Temp:      TempSrc:      Resp: 24 17 13 18   Height:      Weight:      SpO2: 100% 95% 97% 97%   Body mass index is 24.95 kg/(m^2).  General:  WDWN in NAD appears deconditioned HENT: WNL Eyes: Pupils equal Pulmonary: normal non-labored breathing Cardiac: RRR, without  Murmurs, rubs or gallops; Abdomen: soft, NT, no masses Skin: no rashes, ulcers noted;  no Gangrene , no cellulitis; no open wounds;   Vascular Exam/Pulses:palpable radial and brachial pulses He is left handed.   He has a wound on the posterior upper right arm from removal of skin cancer.  Multiple superficial abrasions.  Left IJ temp catheter inplace  Musculoskeletal: no muscle wasting or atrophy; positive edema  Neurologic: A&O X 3; Appropriate Affect ;  SENSATION: normal; MOTOR FUNCTION: 4/5 Symmetric Speech is fluent/normal   Significant Diagnostic Studies: CBC Lab Results  Component Value Date   WBC 7.8 10/10/2013   HGB 7.0* 10/10/2013   HCT 21.8* 10/10/2013  MCV 92.8 10/10/2013   PLT 95* 10/10/2013    BMET    Component Value Date/Time   NA 134* 10/10/2013 0415   K 4.8 10/10/2013 0415   CL 102 10/10/2013 0415   CO2 16* 10/10/2013 0415   GLUCOSE 203* 10/10/2013 0415   BUN 112* 10/10/2013 0415   CREATININE 3.65* 10/10/2013 0415   CALCIUM 8.4 10/10/2013 0415   GFRNONAA 16* 10/10/2013 0415   GFRAA 19* 10/10/2013 0415   Estimated Creatinine Clearance: 23.5 ml/min (by C-G formula based on Cr of 3.65).  COAG Lab Results  Component Value Date   INR 1.37 10/09/2013   INR 1.21 10/08/2013   INR 1.02 04/19/2013      Non-Invasive Vascular Imaging: pending vein mapping  ASSESSMENT/PLAN:  ESRD plan dialysis access pending vein mapping He is left handed so we will try to place it upper right AV Fistula.  [name] 10/10/2013 9:45 AM  Have examined the patient and talked with him and his family. He has severe skin changes do to his chronic immune suppression and recently had skin cancer removed from posterior aspect of right upper arm which has not healed. Left upper extremity also had skin cancer which is scheduled to be removed in the proximal forearm. He has large bore IV in left antecubital vein with some erythema surrounding this He has had previous right radial cephalic AV fistula 40 years ago  Because of his severe skin changes and bilateral skin cancer surgery-do not think he is a candidate for creation of AV fistula at this time.  Would recommend removal of left the in antecubital area and for [place] to reevaluate following the skin cancer of the left upper extremity. At that time depending on vein mapping results he may be candidate for brachial cephalic AV fistula.  Only other option would be to insert an AV graft in the thigh.

## 2013-10-10 NOTE — Progress Notes (Signed)
PULMONARY  / CRITICAL CARE MEDICINE  Name: [name] MRN: [id number] DOB: 1945/12/05    ADMISSION DATE:  10/07/2013 CONSULTATION DATE:  10/08/13  REFERRING MD :  Danise Edge PRIMARY SERVICE: IMTS  CHIEF COMPLAINT:  Dyspnea  BRIEF PATIENT DESCRIPTION: 67 yo immunosuppressed with ESRD s/p kidney transplant, CAD, CHF (EF 40-45%), HTN, insulin dependent DM. Found to have pericardial effusion on echo. Taken to OR for pericardial window.   SIGNIFICANT EVENTS / STUDIES:  11/23  Admitted with progressive dyspnea 11/24  TTE >>> Large pericardial effusion without tamponade, EF 40-45% 11/24  OR >>> Pericardial window 11/26  DH access placed by IR  LINES / TUBES: OETT 11/24 >>> 11/24 Foley 11/24 >>> R IJ TLC 11/24 >>> R brach A-line 11/24 >>> 11/25 Pericardial drain 11/24 >>> L [place] HD cath 11/26 >>>  CULTURES:  ANTIBIOTICS: Zinacef 11/24 >>> 11/25  SUBJECTIVE: No complaints. No acute overnight events.  VITAL SIGNS: Temp:  [98.4 F (36.9 C)-98.7 F (37.1 C)] 98.7 F (37.1 C) (11/26 1153) Pulse Rate:  [69-85] 85 (11/26 1500) Resp:  [11-24] 13 (11/26 1500) BP: (131-150)/(58-67) 140/66 mmHg (11/26 0930) SpO2:  [95 %-100 %] 99 % (11/26 1500) Arterial Line BP: (98-138)/(44-95) 98/95 mmHg (11/26 1500) Weight:  [88.2 kg (194 lb 7.1 oz)] 88.2 kg (194 lb 7.1 oz) (11/26 0500)  HEMODYNAMICS:   VENTILATOR SETTINGS:   INTAKE / OUTPUT: Intake/Output     11/25 0701 - 11/26 0700 11/26 0701 - 11/27 0700   P.O. 925 480   I.V. (mL/kg) 240 (2.7) 160 (1.8)   IV Piggyback     Total Intake(mL/kg) 1165 (13.2) 640 (7.3)   Urine (mL/kg/hr) 900 (0.4) 290 (0.4)   Blood     Chest Tube 190 (0.1) 80 (0.1)   Total Output 1090 370   Net +75 +270          PHYSICAL EXAMINATION: General:  Resting comfortable  Neuro:  Awake, alert, cooperative, non focal HEENT:  PERRL Cardiovascular:  Regular, minimal drainage from pericardial window  Lungs:  Bilateral air entry, few rales Abdomen:  Soft, non  tender, bowel sounds present Musculoskeletal:  No gross deformities Skin:  No rash  LABS: CBC  Recent Labs Lab 10/08/13 1200 10/09/13 0500 10/10/13 0415  WBC 8.6 8.2 7.8  HGB 7.2* 7.2* 7.0*  HCT 23.4* 22.0* 21.8*  PLT 103* 93* 95*   Coag's  Recent Labs Lab 10/08/13 1200 10/09/13 0500  APTT 29  --   INR 1.21 1.37   BMET  Recent Labs Lab 10/09/13 0500 10/09/13 1015 10/10/13 0415  NA 135 132* 134*  K 4.9 5.3* 4.8  CL 103 101 102  CO2 17* 14* 16*  BUN 107* 110* 112*  CREATININE 3.41* 3.44* 3.65*  GLUCOSE 94 197* 203*   Electrolytes  Recent Labs Lab 10/09/13 0500 10/09/13 1015 10/10/13 0415  CALCIUM 8.2* 8.1* 8.4   Sepsis Markers No results found for this basename: LATICACIDVEN, PROCALCITON, O2SATVEN,  in the last 168 hours  ABG  Recent Labs Lab 10/08/13 1406 10/08/13 1625 10/09/13 0503  PHART 7.319* 7.283* 7.310*  PCO2ART 35.0 36.8 33.0*  PO2ART 96.5 137.0* 71.0*   Liver Enzymes  Recent Labs Lab 10/08/13 0420 10/08/13 1200 10/10/13 0415  AST 21 16 12   ALT 23 19 15   ALKPHOS 58 55 52  BILITOT 0.2* 0.2* 0.2*  ALBUMIN 2.6* 2.4* 2.0*   Cardiac Enzymes  Recent Labs Lab 10/08/13 0420 10/08/13 0825 10/08/13 1405  TROPONINI <0.30 <0.30 <0.30  PROBNP  --  10879.0*  --    Glucose  Recent Labs Lab 10/09/13 1117 10/09/13 1552 10/09/13 1703 10/09/13 2236 10/10/13 0757 10/10/13 1156  GLUCAP 186* 191* 207* 178* 207* 196*   CXR:  11/26 >>> Bibasilar airspace disease / effusions  ASSESSMENT / PLAN:  PULMONARY A:  Acute respiratory failure secondary to pericardial effusion - resolved P:   Goal SpO2 > 92 Supplemental oxygen PRN  CARDIOVASCULAR A: Pericardial effusion s/p pericardial window.  Acute om chronic systolic / diastolic CHF. CAD w/ history of MI. H/o HTN. P:  Goal MAP > 65 TCTS following Restart ASA when OK with TCTS Amlodipine, Coreg, Lipitor  RENAL A:  Acute on chronic renal failure.  S/p renal transplant.   Metabolic acidosis. P:   Trend BMET Renal following HD access placed Hold Lasix IVF KVO Cellcept, Prednisone  GASTROINTESTINAL A:  No acute issues P:   Renal Diet Preadmission Pepcid  HEMATOLOGIC A:  Anemia of chronic / renal disease.  Thrombocytopenia. P:  Trend CBC SCDs for DVT Px Aranesp Feraheme   INFECTIOUS A:  Chronically immunosuppressed, renal transplant. P:   Preadmission Cellcept / Prednisone Prophylactic abx per TCTS completed  ENDOCRINE A:  DM. P:   SSI  NEUROLOGIC A:  Post op pain. Peripheral diabetic neuropathy. Anxiety. P:   Preadmission Gabapentin / Xanax Oxycodone PRN  I have personally obtained history, examined patient, evaluated and interpreted laboratory and imaging results, reviewed medical records, formulated assessment / plan and placed orders.  Lonia Farber, MD Pulmonary and Critical Care Medicine [facility] Pager: [phone]  10/10/2013, 3:40 PM

## 2013-10-10 NOTE — Progress Notes (Signed)
Patient ID: [name], male   DOB: 05/05/1946, 67 y.o.   MRN: [id number] TCTS DAILY ICU PROGRESS NOTE                   [address]            [place] [address]          [phone]   2 Days Post-Op Procedure(s) (LRB): SUBXYPHOID PERICARDIAL WINDOW (N/A)  Total Length of Stay:  LOS: 3 days   Subjective: Feels much better today  Objective: Vital signs in last 24 hours: Temp:  [97.9 F (36.6 C)-98.5 F (36.9 C)] 98.4 F (36.9 C) (11/25 2000) Pulse Rate:  [69-82] 79 (11/26 0700) Cardiac Rhythm:  [-] Ventricular paced (11/26 0400) Resp:  [12-24] 21 (11/26 0700) BP: (117-121)/(53-60) 121/53 mmHg (11/25 1044) SpO2:  [94 %-100 %] 98 % (11/26 0700) Arterial Line BP: (98-138)/(42-59) 126/50 mmHg (11/26 0700) Weight:  [194 lb 7.1 oz (88.2 kg)] 194 lb 7.1 oz (88.2 kg) (11/26 0500)  Filed Weights   10/08/13 1800 10/09/13 0500 10/10/13 0500  Weight: 192 lb (87.091 kg) 191 lb 9.3 oz (86.9 kg) 194 lb 7.1 oz (88.2 kg)    Weight change: 2 lb 7.1 oz (1.109 kg)   Hemodynamic parameters for last 24 hours:    Intake/Output from previous day: 11/25 0701 - 11/26 0700 In: 1145 [P.O.:925; I.V.:220] Out: 1055 [Urine:865; Chest Tube:190]  Intake/Output this shift:    Current Meds: Scheduled Meds: . ALPRAZolam  0.25 mg Oral BID  . amLODipine  5 mg Oral BID  . bisacodyl  10 mg Oral Daily  . carvedilol  12.5 mg Oral BID WC  . famotidine  20 mg Oral Daily  . ferrous sulfate  325 mg Oral BID WC  . gabapentin  300 mg Oral BID  . hydrocortisone sodium succinate  50 mg Intravenous Q6H  . insulin aspart  0-15 Units Subcutaneous TID WC  . insulin aspart  0-5 Units Subcutaneous QHS  . mycophenolate  500 mg Oral BID  . pantoprazole  40 mg Oral Daily  . predniSONE  5 mg Oral BID WC  . sodium chloride  3 mL Intravenous Q12H   Continuous Infusions:  PRN Meds:.sodium chloride, sodium chloride, sodium chloride, acetaminophen, feeding supplement (NEPRO CARB STEADY),  heparin, lidocaine (PF), lidocaine-prilocaine, ondansetron (ZOFRAN) IV, ondansetron (ZOFRAN) IV, oxyCODONE, pentafluoroprop-tetrafluoroeth, potassium chloride, senna-docusate, sodium chloride, tobramycin  General appearance: alert, cooperative and more alert and talkative pericardial drainage decreasing  Lab Results: CBC: Recent Labs  10/09/13 0500 10/10/13 0415  WBC 8.2 7.8  HGB 7.2* 7.0*  HCT 22.0* 21.8*  PLT 93* 95*   BMET:  Recent Labs  10/09/13 1015 10/10/13 0415  NA 132* 134*  K 5.3* 4.8  CL 101 102  CO2 14* 16*  GLUCOSE 197* 203*  BUN 110* 112*  CREATININE 3.44* 3.65*  CALCIUM 8.1* 8.4    PT/INR:  Recent Labs  10/09/13 0500  LABPROT 16.5*  INR 1.37   Radiology: Dg Chest Portable 1 View  10/08/2013   CLINICAL DATA:  Postop for central line placement. Renal transplant.  EXAM: PORTABLE CHEST - 1 VIEW  COMPARISON:  10/07/2013  FINDINGS: Pacer/AICD device which is unchanged.  Right internal jugular line which terminates at the mid to low SVC.  Midline trachea. Cardiomegaly accentuated by AP portable technique. Small left and possible small right pleural effusions. No pneumothorax. Diminished lung volumes. Development of mild pulmonary venous congestion. Bibasilar left greater than right  airspace disease. Similar, given differences in technique.  IMPRESSION: Right IJ central line terminating at mid to low SVC, without pneumothorax.  Worsened aeration, with development of mild pulmonary venous congestion and decreased lung volumes.  Similar small left and possible right pleural effusions with adjacent atelectasis or infection.   Electronically Signed   By: Jeronimo Greaves M.D.   On: 10/08/2013 17:50   Path: on cytology pericardial fluid neg for malignant cells   Assessment/Plan: S/P Procedure(s) (LRB): SUBXYPHOID PERICARDIAL WINDOW (N/A) Mobilize to start dialysis today D/c pericardial tube after first dialysis today     Batina Dougan B 10/10/2013 7:41 AM

## 2013-10-10 NOTE — Progress Notes (Addendum)
Patient ID: [name], male   DOB: 17-Dec-1945, 67 y.o.   MRN: [id number]  [facility] Progress Note    Assessment/ Plan:   1. Acute renal failure on chronic kidney disease stage IV. Pericardial window placed 2 days ago with good drainage of pericardial effusion fluid. Yesterday, I was optimistic with his improved urine output and slight improvement of BUN/creatinine that he would continue on to have renal recovery however, labs today show otherwise. We'll proceed for having a dialysis catheter placed by interventional radiology (temporary for the small chance that he continues to recover) and get dialysis and later today. Also consult vascular surgery for placement of permanent hemodialysis access. 2. Shortness of breath/pericardial effusion: Improved shortness of breath and now decreasing rate of drainage. Plans noted by thoracic surgery for possible discontinuation of pericardial drain after dialysis catheter placed.  3. Anemia: Likely anemia secondary to chronic kidney disease. Start Aranesp-status post intravenous iron yesterday.  4. Metabolic bone disease: We'll start on calcitriol given elevated PTH.  5. Nutrition: Low albumin noted and likely to be reflective of worsening chronic kidney disease. Started on nutritional supplements   Subjective:   Reports to be feeling better-anxious about doing dialysis    Objective:   BP 121/53  Pulse 79  Temp(Src) 98.4 F (36.9 C) (Oral)  Resp 21  Ht 6\' 2"  (1.88 m)  Wt 88.2 kg (194 lb 7.1 oz)  BMI 24.95 kg/m2  SpO2 98%  Intake/Output Summary (Last 24 hours) at 10/10/13 7829 Last data filed at 10/10/13 0600  Gross per 24 hour  Intake   1145 ml  Output   1055 ml  Net     90 ml   Weight change: 1.109 kg (2 lb 7.1 oz)  Physical Exam: Gen: Comfortably resting in bed CVS: Pulse regular in rate and rhythm, heart sounds S1 and S2 normal Resp: Clear to auscultation bilaterally-poor inspiratory effort Abd: Soft, flat, nontender  bowel sounds are normal Ext: Trace to 1+ lower extremity edema  Imaging: Dg Chest Portable 1 View  10/08/2013   CLINICAL DATA:  Postop for central line placement. Renal transplant.  EXAM: PORTABLE CHEST - 1 VIEW  COMPARISON:  10/07/2013  FINDINGS: Pacer/AICD device which is unchanged.  Right internal jugular line which terminates at the mid to low SVC.  Midline trachea. Cardiomegaly accentuated by AP portable technique. Small left and possible small right pleural effusions. No pneumothorax. Diminished lung volumes. Development of mild pulmonary venous congestion. Bibasilar left greater than right airspace disease. Similar, given differences in technique.  IMPRESSION: Right IJ central line terminating at mid to low SVC, without pneumothorax.  Worsened aeration, with development of mild pulmonary venous congestion and decreased lung volumes.  Similar small left and possible right pleural effusions with adjacent atelectasis or infection.   Electronically Signed   By: Jeronimo Greaves M.D.   On: 10/08/2013 17:50    Labs: BMET  Recent Labs Lab 10/07/13 1902 10/08/13 0420 10/08/13 1200 10/09/13 0500 10/09/13 1015 10/10/13 0415  NA 138 136 135 135 132* 134*  K 4.8 4.9 5.1 4.9 5.3* 4.8  CL 105 104 102 103 101 102  CO2 16* 17* 17* 17* 14* 16*  GLUCOSE 68* 83 149* 94 197* 203*  BUN 112* 112* 114* 107* 110* 112*  CREATININE 3.28* 3.45* 3.43* 3.41* 3.44* 3.65*  CALCIUM 8.2* 8.3* 8.0* 8.2* 8.1* 8.4   CBC  Recent Labs Lab 10/07/13 1902 10/08/13 0420 10/08/13 1200 10/09/13 0500 10/10/13 0415  WBC 12.3* 10.8*  8.6 8.2 7.8  NEUTROABS 11.3* 9.8*  --   --   --   HGB 8.0* 7.8* 7.2* 7.2* 7.0*  HCT 24.1* 24.9* 23.4* 22.0* 21.8*  MCV 93.1 95.4 94.4 94.4 92.8  PLT 104* 124* 103* 93* 95*    Medications:    . ALPRAZolam  0.25 mg Oral BID  . amLODipine  5 mg Oral BID  . bisacodyl  10 mg Oral Daily  . carvedilol  12.5 mg Oral BID WC  . famotidine  20 mg Oral Daily  . ferrous sulfate  325 mg Oral  BID WC  . gabapentin  300 mg Oral BID  . hydrocortisone sodium succinate  50 mg Intravenous Q6H  . insulin aspart  0-15 Units Subcutaneous TID WC  . insulin aspart  0-5 Units Subcutaneous QHS  . mycophenolate  500 mg Oral BID  . pantoprazole  40 mg Oral Daily  . predniSONE  5 mg Oral BID WC  . sodium chloride  3 mL Intravenous Q12H   Zetta Bills, MD 10/10/2013, 7:33 AM

## 2013-10-10 NOTE — Consult Note (Signed)
WOC consult completed 10/08/13, please see note and wound care orders have been written.  Davina Poke RN,CWOCN [phone]

## 2013-10-11 LAB — BASIC METABOLIC PANEL
Calcium: 8.2 mg/dL — ABNORMAL LOW (ref 8.4–10.5)
Chloride: 102 mEq/L (ref 96–112)
GFR calc Af Amer: 25 mL/min — ABNORMAL LOW (ref >90)
GFR calc non Af Amer: 22 mL/min — ABNORMAL LOW (ref >90)
Glucose, Bld: 166 mg/dL — ABNORMAL HIGH (ref 70–99)
Potassium: 3.8 mEq/L (ref 3.5–5.1)
Sodium: 135 mEq/L (ref 135–145)

## 2013-10-11 LAB — BODY FLUID CULTURE: Culture: NO GROWTH

## 2013-10-11 LAB — GLUCOSE, CAPILLARY: Glucose-Capillary: 152 mg/dL — ABNORMAL HIGH (ref 70–99)

## 2013-10-11 LAB — HEPATITIS B SURFACE ANTIGEN: Hepatitis B Surface Ag: NEGATIVE

## 2013-10-11 LAB — HEPATITIS B SURFACE ANTIBODY,QUALITATIVE: Hep B S Ab: NEGATIVE

## 2013-10-11 MED ORDER — CALCITRIOL 0.5 MCG PO CAPS
0.5000 ug | ORAL_CAPSULE | Freq: Every day | ORAL | Status: DC
  Administered 2013-10-11 – 2013-10-14 (×4): 0.5 ug via ORAL
  Filled 2013-10-11 (×5): qty 1

## 2013-10-11 NOTE — Progress Notes (Signed)
PULMONARY  / CRITICAL CARE MEDICINE  Name: [name] MRN: [id number] DOB: [birth year]    ADMISSION DATE:  10/07/2013 CONSULTATION DATE:  10/08/13  REFERRING MD :  Danise Edge PRIMARY SERVICE: IMTS  CHIEF COMPLAINT:  Dyspnea  BRIEF PATIENT DESCRIPTION: 67 yo immunosuppressed with ESRD s/p kidney transplant, CAD, CHF (EF 40-45%), HTN, insulin dependent DM. Found to have pericardial effusion on echo. Taken to OR for pericardial window.   SIGNIFICANT EVENTS / STUDIES:  11/23  Admitted with progressive dyspnea 11/24  TTE >>> Large pericardial effusion without tamponade, EF 40-45% 11/24  OR >>> Pericardial window 11/26  DH access placed by IR  LINES / TUBES: OETT 11/24 >>> 11/24 Foley 11/24 >>> R IJ TLC 11/24 >>> R brach A-line 11/24 >>> 11/25 Pericardial drain 11/24 >>>11/26 L [facility] HD cath 11/26 >>>  CULTURES:  ANTIBIOTICS: Zinacef 11/24 >>> 11/25  SUBJECTIVE: No complaints. No acute overnight events.  VITAL SIGNS: Temp:  [97 F (36.1 C)-98.7 F (37.1 C)] 98.2 F (36.8 C) (11/27 0749) Pulse Rate:  [64-85] 76 (11/27 0900) Resp:  [11-21] 18 (11/27 0900) BP: (118-143)/(54-72) 142/58 mmHg (11/27 0900) SpO2:  [93 %-100 %] 96 % (11/27 0900) Arterial Line BP: (98-138)/(55-95) 98/95 mmHg (11/26 1500) Weight:  [91 kg (200 lb 9.9 oz)] 91 kg (200 lb 9.9 oz) (11/27 0054)  HEMODYNAMICS:   VENTILATOR SETTINGS:   INTAKE / OUTPUT: Intake/Output     11/26 0701 - 11/27 0700 11/27 0701 - 11/28 0700   P.O. 880 240   I.V. (mL/kg) 420 (4.6) 40 (0.4)   Total Intake(mL/kg) 1300 (14.3) 280 (3.1)   Urine (mL/kg/hr) 860 (0.4) 25 (0.1)   Other 1000 (0.5)    Chest Tube 240 (0.1)    Total Output 2100 25   Net -800 +255          PHYSICAL EXAMINATION: General:  Resting comfortable  Neuro:  Awake, alert, cooperative, non focal HEENT:  PERRL Cardiovascular:  Regular, minimal drainage from pericardial window  Lungs:  Bilateral air entry, few rales Abdomen:  Soft, non tender, bowel sounds  present Musculoskeletal:  No gross deformities Skin:  No rash  LABS: CBC  Recent Labs Lab 10/08/13 1200 10/09/13 0500 10/10/13 0415  WBC 8.6 8.2 7.8  HGB 7.2* 7.2* 7.0*  HCT 23.4* 22.0* 21.8*  PLT 103* 93* 95*   Coag's  Recent Labs Lab 10/08/13 1200 10/09/13 0500  APTT 29  --   INR 1.21 1.37   BMET  Recent Labs Lab 10/09/13 1015 10/10/13 0415 10/11/13 0500  NA 132* 134* 135  K 5.3* 4.8 3.8  CL 101 102 102  CO2 14* 16* 20  BUN 110* 112* 88*  CREATININE 3.44* 3.65* 2.84*  GLUCOSE 197* 203* 166*   Electrolytes  Recent Labs Lab 10/09/13 1015 10/10/13 0415 10/11/13 0500  CALCIUM 8.1* 8.4 8.2*   Sepsis Markers No results found for this basename: LATICACIDVEN, PROCALCITON, O2SATVEN,  in the last 168 hours  ABG  Recent Labs Lab 10/08/13 1406 10/08/13 1625 10/09/13 0503  PHART 7.319* 7.283* 7.310*  PCO2ART 35.0 36.8 33.0*  PO2ART 96.5 137.0* 71.0*   Liver Enzymes  Recent Labs Lab 10/08/13 0420 10/08/13 1200 10/10/13 0415  AST 21 16 12   ALT 23 19 15   ALKPHOS 58 55 52  BILITOT 0.2* 0.2* 0.2*  ALBUMIN 2.6* 2.4* 2.0*   Cardiac Enzymes  Recent Labs Lab 10/08/13 0420 10/08/13 0825 10/08/13 1405  TROPONINI <0.30 <0.30 <0.30  PROBNP  --  10879.0*  --  Glucose  Recent Labs Lab 10/09/13 2236 10/10/13 0757 10/10/13 1156 10/10/13 1916 10/10/13 2217 10/11/13 0751  GLUCAP 178* 207* 196* 192* 199* 161*   CXR:  11/26 >>> Bibasilar airspace disease / effusions  ASSESSMENT / PLAN:  PULMONARY A:  Acute respiratory failure secondary to pericardial effusion - resolved P:   - Goal SpO2 > 92. - Supplemental oxygen PRN.  CARDIOVASCULAR A: Pericardial effusion s/p pericardial window.  Acute om chronic systolic / diastolic CHF. CAD w/ history of MI. H/o HTN. P:  - TCTS following. - Restart ASA when OK with TCTS. - Amlodipine, Coreg, Lipitor as ordered. - Pericadial drain out.  RENAL A:  Acute on chronic renal failure.  S/p renal  transplant.  Metabolic acidosis. P:   - Trend BMET. - Renal following. - HD access placed via IR. - Continue to hold Lasix. - HD today per renal.. - IVF KVO. - Cellcept, Prednisone.  GASTROINTESTINAL A:  No acute issues P:   - Renal Diet as ordered. - D/C Pepcid. - Continue protonix.  HEMATOLOGIC A:  Anemia of chronic / renal disease.  Thrombocytopenia. P:  - Trend CBC. - SCDs for DVT Px. - Aranesp. - Feraheme.  INFECTIOUS A:  Chronically immunosuppressed, renal transplant. P:   - Preadmission Cellcept / Prednisone as ordered. - Prophylactic abx per TCTS completed.  ENDOCRINE A:  DM. P:   - SSI.  NEUROLOGIC A:  Post op pain. Peripheral diabetic neuropathy. Anxiety. P:   - Preadmission Gabapentin / Xanax restarted. - Oxycodone PRN.  Will transfer to SDU and back to [facility].  PCCM will be available PRN.  I have personally obtained history, examined patient, evaluated and interpreted laboratory and imaging results, reviewed medical records, formulated assessment / plan and placed orders.  Alyson Reedy, M.D. [facility] Pulmonary/Critical Care Medicine. Pager: [phone]. After hours pager: [phone].

## 2013-10-11 NOTE — Progress Notes (Signed)
3 Days Post-Op Procedure(s) (LRB): SUBXYPHOID PERICARDIAL WINDOW (N/A) Subjective: Resting comfortably in bed after subxiphoid pericardial window Pericardial drain has been removed without change in hemodynamics or appearance of chest x-ray Incision clean and dry  Objective: Vital signs in last 24 hours: Temp:  [97 F (36.1 C)-98.4 F (36.9 C)] 98.4 F (36.9 C) (11/27 1211) Pulse Rate:  [64-85] 73 (11/27 1100) Cardiac Rhythm:  [-] Ventricular paced (11/27 0800) Resp:  [11-21] 19 (11/27 1100) BP: (118-143)/(51-72) 137/58 mmHg (11/27 1100) SpO2:  [93 %-100 %] 95 % (11/27 1100) Arterial Line BP: (98-107)/(55-95) 98/95 mmHg (11/26 1500) Weight:  [200 lb 9.9 oz (91 kg)] 200 lb 9.9 oz (91 kg) (11/27 0054)  Hemodynamic parameters for last 24 hours:   Stable Intake/Output from previous day: 11/26 0701 - 11/27 0700 In: 1300 [P.O.:880; I.V.:420] Out: 2100 [Urine:860; Chest Tube:240] Intake/Output this shift: Total I/O In: 300 [P.O.:240; I.V.:60] Out: 110 [Urine:110]  Exam Alert and responsive Surgical incision healing well  Lab Results:  Recent Labs  10/09/13 0500 10/10/13 0415  WBC 8.2 7.8  HGB 7.2* 7.0*  HCT 22.0* 21.8*  PLT 93* 95*   BMET:  Recent Labs  10/10/13 0415 10/11/13 0500  NA 134* 135  K 4.8 3.8  CL 102 102  CO2 16* 20  GLUCOSE 203* 166*  BUN 112* 88*  CREATININE 3.65* 2.84*  CALCIUM 8.4 8.2*    PT/INR:  Recent Labs  10/09/13 0500  LABPROT 16.5*  INR 1.37   ABG    Component Value Date/Time   PHART 7.310* 10/09/2013 0503   HCO3 16.7* 10/09/2013 0503   TCO2 18 10/09/2013 0503   ACIDBASEDEF 9.0* 10/09/2013 0503   O2SAT 93.0 10/09/2013 0503   CBG (last 3)   Recent Labs  10/10/13 2217 10/11/13 0751 10/11/13 1212  GLUCAP 199* 161* 182*    Assessment/Plan: S/P Procedure(s) (LRB): SUBXYPHOID PERICARDIAL WINDOW (N/A) We'll check chest x-ray in a.m.   LOS: 4 days    VAN TRIGT III,PETER 10/11/2013

## 2013-10-11 NOTE — Progress Notes (Signed)
Transferred to 2S02 via wheelchair. Report given to receiving RN. No untoward event happened during transport.

## 2013-10-11 NOTE — Progress Notes (Signed)
Patient ID: [name], male   DOB: 11/15/1946, 67 y.o.   MRN: [id number]  [facility] Progress Note    Assessment/ Plan:   1. Acute renal failure on chronic kidney disease stage IV. 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 Currently, with non-oliguric urine output however poor clearance for which he got hemodialysis done yesterday. I will order for hemodialysis again tomorrow-no acute needs noted at this time. I appreciate evaluation by vascular surgery for permanent access-he has unfortunately very limited access due to several skin lesions (malignant/premalignant/postoperative) in his upper extremities that primarily forces permanent access in his lower extremity. Continue to assess periodically anticipate that he has at least partial recovery to allow more time before he needs permanent access. 2. Shortness of breath/pericardial effusion: Status post subxiphoid pericardial window and drain-drain has been discontinued and the patient symptomatically better. Pathology evaluation showed this to be a T9 effusion-likely transudate from uremia.  3. Anemia: Likely anemia secondary to chronic kidney disease. On Aranesp and status post intravenous iron therapy  4. Metabolic bone disease: We'll start on calcitriol given elevated PTH.  5. Nutrition: Low albumin noted and likely to be reflective of worsening chronic kidney disease. Started on nutritional supplements   Subjective:   Tired this morning-states that he has dialysis late last night and lost sleep    Objective:   BP 143/60  Pulse 73  Temp(Src) 98.2 F (36.8 C) (Oral)  Resp 16  Ht 6\' 2"  (1.88 m)  Wt 91 kg (200 lb 9.9 oz)  BMI 25.75 kg/m2  SpO2 94%  Intake/Output Summary (Last 24 hours) at 10/11/13 3086 Last data filed at 10/11/13 0600  Gross per 24 hour  Intake   1280 ml  Output   2070 ml  Net   -790 ml   Weight change: 2.8 kg (6 lb 2.8 oz)  Physical Exam: Gen:  Somnolent resting in bed-visit to awaken and engages conversation CVS: Pulse regular in rate and rhythm-S1 and S2 sound normal Resp: Clear to auscultation bilaterally (anterior chest)-suboptimal inspiratory effort Abd: Soft, obese, nontender and bowel sounds are normal Ext: 1-2+ edema over upper and lower extremities  Imaging: Ir Fluoro Guide Cv Line Left  10/10/2013   CLINICAL DATA:  Acute renal failure, heart failure, diabetes  EXAM: Ultrasound guidance for vascular access  Temporary left IJ TRIALYSIS catheter  Date:  10/10/2013 9:32 AM11/26/2014 9:32 AM  Radiologist:  Judie Petit. Ruel Favors, MD  Guidance:  ULTRASOUND AND FLUOROSCOPIC  MEDICATIONS AND MEDICAL HISTORY: 2 mg Versed, 100 mcg fentanyl  ANESTHESIA/SEDATION: 15 min  CONTRAST:  None.  FLUOROSCOPY TIME:  1 MIN  PROCEDURE: Informed consent was obtained from the patient following explanation of the procedure, risks, benefits and alternatives. The patient understands, agrees and consents for the procedure. All questions were addressed. A time out was performed.  Maximal barrier sterile technique utilized including caps, mask, sterile gowns, sterile gloves, large sterile drape, hand hygiene, and ChloraPrep  Under sterile conditions and local anesthesia, left IJ micropuncture venous access was performed with ultrasound. Images obtained for documentation. Guidewire advanced centrally along the pacer wires. Transitional dilator inserted. Guidewire exchanged for an Amplatz guidewire. Dilatation performed to insert a 24 cm temporaryTrialysis catheter. Tips at the [facility] RA junction. Images obtained for documentation. Blood aspirated easily from all 3 lumens followed by saline and heparin flushes. External caps applied. Catheter secured with Prolene sutures. Sterile dressing applied. No immediate complication. Patient tolerated the procedure well.  COMPLICATIONS: No immediate  IMPRESSION: Successful ultrasound and fluoroscopic left IJ temporary Trialysis catheter.  Tips SVC RA junction. Ready for use   Electronically Signed   By: Ruel Favors M.D.   On: 10/10/2013 09:44   Ir US Guide Vasc Access Left  10/10/2013   CLINICAL DATA:  Acute renal failure, heart failure, diabetes  EXAM: Ultrasound guidance for vascular access  Temporary left IJ TRIALYSIS catheter  Date:  10/10/2013 9:32 AM11/26/2014 9:32 AM  Radiologist:  Judie Petit. Ruel Favors, MD  Guidance:  ULTRASOUND AND FLUOROSCOPIC  MEDICATIONS AND MEDICAL HISTORY: 2 mg Versed, 100 mcg fentanyl  ANESTHESIA/SEDATION: 15 min  CONTRAST:  None.  FLUOROSCOPY TIME:  1 MIN  PROCEDURE: Informed consent was obtained from the patient following explanation of the procedure, risks, benefits and alternatives. The patient understands, agrees and consents for the procedure. All questions were addressed. A time out was performed.  Maximal barrier sterile technique utilized including caps, mask, sterile gowns, sterile gloves, large sterile drape, hand hygiene, and ChloraPrep  Under sterile conditions and local anesthesia, left IJ micropuncture venous access was performed with ultrasound. Images obtained for documentation. Guidewire advanced centrally along the pacer wires. Transitional dilator inserted. Guidewire exchanged for an Amplatz guidewire. Dilatation performed to insert a 24 cm temporaryTrialysis catheter. Tips at the [facility] RA junction. Images obtained for documentation. Blood aspirated easily from all 3 lumens followed by saline and heparin flushes. External caps applied. Catheter secured with Prolene sutures. Sterile dressing applied. No immediate complication. Patient tolerated the procedure well.  COMPLICATIONS: No immediate  IMPRESSION: Successful ultrasound and fluoroscopic left IJ temporary Trialysis catheter. Tips SVC RA junction. Ready for use   Electronically Signed   By: Ruel Favors M.D.   On: 10/10/2013 09:44   Dg Chest Port 1 View  10/10/2013   CLINICAL DATA:  Subxiphoid pericardial window procedure.  EXAM: PORTABLE  CHEST - 1 VIEW  COMPARISON:  10/08/2013  FINDINGS: Right jugular central line in the lower SVC region. Again noted is enlargement of the cardiac silhouette. There is a drain overlying the inferior aspect of the cardiac silhouette. Stable position of the cardiac leads. Persistent densities at the lung bases could represent pleural effusions and atelectasis. No evidence for large pneumothorax or significant mediastinal air.  IMPRESSION: Persistent basilar chest densities that may represent a combination of atelectasis and pleural fluid.  Stable enlargement of the cardiac silhouette.   Electronically Signed   By: Richarda Overlie M.D.   On: 10/10/2013 08:14    Labs: BMET  Recent Labs Lab 10/07/13 1902 10/08/13 0420 10/08/13 1200 10/09/13 0500 10/09/13 1015 10/10/13 0415 10/11/13 0500  NA 138 136 135 135 132* 134* 135  K 4.8 4.9 5.1 4.9 5.3* 4.8 3.8  CL 105 104 102 103 101 102 102  CO2 16* 17* 17* 17* 14* 16* 20  GLUCOSE 68* 83 149* 94 197* 203* 166*  BUN 112* 112* 114* 107* 110* 112* 88*  CREATININE 3.28* 3.45* 3.43* 3.41* 3.44* 3.65* 2.84*  CALCIUM 8.2* 8.3* 8.0* 8.2* 8.1* 8.4 8.2*   CBC  Recent Labs Lab 10/07/13 1902 10/08/13 0420 10/08/13 1200 10/09/13 0500 10/10/13 0415  WBC 12.3* 10.8* 8.6 8.2 7.8  NEUTROABS 11.3* 9.8*  --   --   --   HGB 8.0* 7.8* 7.2* 7.2* 7.0*  HCT 24.1* 24.9* 23.4* 22.0* 21.8*  MCV 93.1 95.4 94.4 94.4 92.8  PLT 104* 124* 103* 93* 95*    Medications:    . ALPRAZolam  0.25 mg Oral BID  . amLODipine  5 mg Oral BID  . bisacodyl  10 mg Oral Daily  . carvedilol  12.5 mg Oral BID WC  . darbepoetin (ARANESP) injection - DIALYSIS  60 mcg Intravenous Q Wed-HD  . famotidine  20 mg Oral Daily  . gabapentin  300 mg Oral QHS  . insulin aspart  0-15 Units Subcutaneous TID WC  . insulin aspart  0-5 Units Subcutaneous QHS  . mycophenolate  500 mg Oral BID  . pantoprazole  40 mg Oral Daily  . predniSONE  5 mg Oral BID WC  . sodium chloride  3 mL Intravenous Q12H    Zetta Bills, MD 10/11/2013, 7:18 AM

## 2013-10-12 ENCOUNTER — Inpatient Hospital Stay (HOSPITAL_COMMUNITY): Payer: Medicare Other

## 2013-10-12 DIAGNOSIS — E1169 Type 2 diabetes mellitus with other specified complication: Secondary | ICD-10-CM

## 2013-10-12 LAB — RENAL FUNCTION PANEL
CO2: 19 mEq/L (ref 19–32)
Calcium: 8 mg/dL — ABNORMAL LOW (ref 8.4–10.5)
GFR calc Af Amer: 25 mL/min — ABNORMAL LOW (ref >90)
GFR calc non Af Amer: 21 mL/min — ABNORMAL LOW (ref >90)
Glucose, Bld: 171 mg/dL — ABNORMAL HIGH (ref 70–99)
Phosphorus: 3.8 mg/dL (ref 2.3–4.6)
Sodium: 132 mEq/L — ABNORMAL LOW (ref 135–145)

## 2013-10-12 LAB — TYPE AND SCREEN
ABO/RH(D): A POS
Antibody Screen: NEGATIVE
Unit division: 0
Unit division: 0
Unit division: 0

## 2013-10-12 LAB — CBC
HCT: 22.8 % — ABNORMAL LOW (ref 39.0–52.0)
Hemoglobin: 7.5 g/dL — ABNORMAL LOW (ref 13.0–17.0)
Hemoglobin: 7.2 g/dL — ABNORMAL LOW (ref 13.0–17.0)
MCH: 30 pg (ref 26.0–34.0)
MCH: 30.5 pg (ref 26.0–34.0)
MCHC: 32.9 g/dL (ref 30.0–36.0)
MCHC: 33.2 g/dL (ref 30.0–36.0)
MCV: 91.2 fL (ref 78.0–100.0)
MCV: 91.9 fL (ref 78.0–100.0)
Platelets: 95 10*3/uL — ABNORMAL LOW (ref 150–400)

## 2013-10-12 LAB — BASIC METABOLIC PANEL
BUN: 91 mg/dL — ABNORMAL HIGH (ref 6–23)
Calcium: 8.1 mg/dL — ABNORMAL LOW (ref 8.4–10.5)
Creatinine, Ser: 2.87 mg/dL — ABNORMAL HIGH (ref 0.50–1.35)
GFR calc non Af Amer: 22 mL/min — ABNORMAL LOW (ref >90)
Glucose, Bld: 128 mg/dL — ABNORMAL HIGH (ref 70–99)

## 2013-10-12 LAB — GLUCOSE, CAPILLARY: Glucose-Capillary: 119 mg/dL — ABNORMAL HIGH (ref 70–99)

## 2013-10-12 MED ORDER — BISACODYL 10 MG RE SUPP: 10.0000 mg | Freq: Every day | RECTAL | Status: DC | PRN

## 2013-10-12 MED ORDER — OXYCODONE HCL 5 MG PO TABS
ORAL_TABLET | ORAL | Status: AC
  Filled 2013-10-12: qty 1

## 2013-10-12 MED ORDER — ALTEPLASE 100 MG IV SOLR
4.0000 mg | Freq: Once | INTRAVENOUS | Status: AC
  Administered 2013-10-12: 4 mg
  Filled 2013-10-12: qty 4

## 2013-10-12 MED ORDER — HEPARIN SODIUM (PORCINE) 5000 UNIT/ML IJ SOLN
5000.0000 [IU] | Freq: Three times a day (TID) | INTRAMUSCULAR | Status: DC
  Administered 2013-10-12 – 2013-10-15 (×8): 5000 [IU] via SUBCUTANEOUS
  Filled 2013-10-12 (×13): qty 1

## 2013-10-12 MED ORDER — DOCUSATE SODIUM 100 MG PO CAPS
100.0000 mg | ORAL_CAPSULE | Freq: Every day | ORAL | Status: DC
  Administered 2013-10-12 – 2013-10-17 (×3): 100 mg via ORAL
  Filled 2013-10-12 (×5): qty 1

## 2013-10-12 MED ORDER — SORBITOL 70 % SOLN
30.0000 mL | Freq: Every day | Status: DC | PRN
  Administered 2013-10-12: 30 mL via ORAL
  Filled 2013-10-12: qty 30

## 2013-10-12 NOTE — Progress Notes (Signed)
[facility] Progress Note [place] TEAM 1 - Stepdown/ICU TEAM   [name] ZOX:[id number] DOB: September 19, 1946 DOA: 10/07/2013 PCP: [name], MD  Brief narrative: 67 year old male status post failing renal transplant on immunosuppressants. He also has a history of coronary artery disease, systolic CHF, insulin-dependent diabetes mellitus, hypertension. The patient came to the hospital for lethargy related to hypoglycemia was found to have a large pericardial effusion which is mentioned to be chronic. He did not have tamponade features. On the day after admission the patient underwent a pericardial window. Effusion is suspected to be secondary to uremia.   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  Pacemaker   Code Status: Full code Family Communication: None Disposition Plan: Follow in step down unit  Consultants: Nephrology CT surgery Vascular surgery  Procedures: 11/24 TTE >>> Large pericardial effusion without tamponade, EF 40-45%  11/24 OR >>> Pericardial window  11/26 DH access placed by IR   Antibiotics: Zinacef 11/24 >>> 11/25   DVT prophylaxis: Heparin  HPI/Subjective: Patient sleepy but awakens  easily and is oriented. He has no complaints   Objective: Blood pressure 123/71, pulse 78, temperature 98.4 F (36.9 C), temperature source Oral, resp. rate 18, height 6\' 2"  (1.88 m), weight 89.7 kg (197 lb 12 oz), SpO2 97.00%.  Intake/Output Summary (Last 24 hours) at 10/12/13 1717 Last data filed at 10/12/13 1400  Gross per 24 hour  Intake    840 ml  Output    375 ml  Net    465 ml     Exam: General: No acute respiratory distress Lungs: Clear to auscultation bilaterally without wheezes or crackles Cardiovascular: Regular rate and rhythm without murmur gallop or rub normal S1 and S2 Abdomen: Nontender, nondistended, soft, bowel sounds positive, no rebound, no ascites, no appreciable mass Extremities: No significant cyanosis, clubbing, or edema bilateral lower extremities  Data Reviewed: Basic Metabolic Panel:  Recent Labs Lab 10/09/13 1015 10/10/13 0415 10/11/13 0500 10/12/13 0600 10/12/13 1523  NA 132* 134* 135 132* 132*  K 5.3* 4.8 3.8 4.0 4.0  CL 101 102 102 100 99  CO2 14* 16* 20 19 19   GLUCOSE 197* 203* 166* 128* 171*  BUN 110* 112* 88* 91* 86*  CREATININE 3.44* 3.65* 2.84* 2.87* 2.89*  CALCIUM 8.1* 8.4 8.2* 8.1* 8.0*  MG  --   --   --  1.7  --   PHOS  --   --   --  4.0 3.8   Liver Function Tests:  Recent Labs Lab 10/07/13 1902 10/08/13 0420 10/08/13 1200 10/10/13 0415 10/12/13 1523  AST 25 21 16 12   --   ALT 22 23 19 15   --   ALKPHOS 58 58 55 52  --   BILITOT 0.3 0.2* 0.2* 0.2*  --  PROT 4.7* 5.0* 4.7* 4.3*  --   ALBUMIN 2.7* 2.6* 2.4* 2.0* 2.0*   No results found for this basename: LIPASE, AMYLASE,  in the last 168 hours No results found for this basename: AMMONIA,  in the last 168 hours CBC:  Recent Labs Lab 10/07/13 1902 10/08/13 0420 10/08/13 1200 10/09/13 0500 10/10/13 0415 10/12/13 0600 10/12/13 1523  WBC 12.3* 10.8* 8.6 8.2 7.8 8.0 8.3  NEUTROABS 11.3* 9.8*  --   --   --   --   --   HGB 8.0* 7.8* 7.2* 7.2* 7.0* 7.5* 7.2*  HCT  24.1* 24.9* 23.4* 22.0* 21.8* 22.8* 21.7*  MCV 93.1 95.4 94.4 94.4 92.8 91.2 91.9  PLT 104* 124* 103* 93* 95* 109* 95*   Cardiac Enzymes:  Recent Labs Lab 10/07/13 2008 10/08/13 0420 10/08/13 0825 10/08/13 1405  TROPONINI <0.30 <0.30 <0.30 <0.30   BNP (last 3 results)  Recent Labs  04/09/13 1856 10/08/13 0825  PROBNP 21940.0* 10879.0*   CBG:  Recent Labs Lab 10/11/13 1212 10/11/13 1746 10/11/13 2256 10/12/13 0902 10/12/13 1250  GLUCAP 182* 158* 152* 119* 127*    Recent Results (from the past 240 hour(s))  MRSA PCR SCREENING     Status: None   Collection Time    10/08/13  2:45 AM      Result Value Range Status   MRSA by PCR NEGATIVE  NEGATIVE Final   Comment:            The GeneXpert MRSA Assay (FDA     approved for NASAL specimens     only), is one component of a     comprehensive MRSA colonization     surveillance program. It is not     intended to diagnose MRSA     infection nor to guide or     monitor treatment for     MRSA infections.  AFB CULTURE WITH SMEAR     Status: None   Collection Time    10/08/13  2:50 PM      Result Value Range Status   Specimen Description FLUID PERICARDIAL   Final   Special Requests NONE   Final   ACID FAST SMEAR     Final   Value: NO ACID FAST BACILLI SEEN     Performed at [facility]   Culture     Final   Value: CULTURE WILL BE EXAMINED FOR 6 WEEKS BEFORE ISSUING A FINAL REPORT     Performed at [facility]   Report Status PENDING   Incomplete  BODY FLUID CULTURE     Status: None   Collection Time    10/08/13  2:50 PM      Result Value Range Status   Specimen Description FLUID PERICARDIAL   Final   Special Requests NONE   Final   Gram Stain     Final   Value: RARE WBC PRESENT,BOTH PMN AND MONONUCLEAR     NO ORGANISMS SEEN     Performed at [facility]     Performed at [facility]   Culture     Final   Value: NO GROWTH 3 DAYS     Performed at [facility]   Report  Status 10/11/2013 FINAL   Final  GRAM STAIN     Status: None   Collection Time    10/08/13  2:50 PM      Result Value Range Status   Specimen Description FLUID PERICARDIAL  Final   Special Requests NONE   Final   Gram Stain     Final   Value: RARE WBC PRESENT,BOTH PMN AND MONONUCLEAR     NO ORGANISMS SEEN   Report Status 10/08/2013 FINAL   Final     Studies:  Recent x-ray studies have been reviewed in detail by the Attending Physician  Scheduled Meds:  Scheduled Meds: . ALPRAZolam  0.25 mg Oral BID  . amLODipine  5 mg Oral BID  . bisacodyl  10 mg Oral Daily  . calcitRIOL  0.5 mcg Oral Daily  . carvedilol  12.5 mg Oral BID WC  . darbepoetin (ARANESP) injection - DIALYSIS  60 mcg Intravenous Q Wed-HD  . docusate sodium  100 mg Oral Daily  . gabapentin  300 mg Oral QHS  . insulin aspart  0-15 Units Subcutaneous TID WC  . insulin aspart  0-5 Units Subcutaneous QHS  . mycophenolate  500 mg Oral BID  . pantoprazole  40 mg Oral Daily  . predniSONE  5 mg Oral BID WC  . sodium chloride  3 mL Intravenous Q12H   Continuous Infusions:   Time spent on care of this patient: 35 min   Calvert Cantor, MD  [facility] Office  [phone] Pager - Text Page per Loretha Stapler as per below:  On-Call/Text Page:      [email or web address]      password [email or web address]  If 7PM-7AM, please contact night-coverage [email or web address] Password [email or web address] 10/12/2013, 5:17 PM   LOS: 5 days

## 2013-10-12 NOTE — Progress Notes (Signed)
Patient ID: EVERRETT LACASSE, male   DOB: August 26, 1946, 67 y.o.   MRN: [id number] The patient is seen today for continued discussion of possible long-term access. He currently being dialyzed via a catheter with some hope for improvement. His vein mapping of his upper extremities show very poor possibility for fistula. He has extensive skin disease on both upper extremity is making Gore-Tex graft very risky for infection. He does have palpable femoral pulses bilaterally. No open lesions in his lower Shoney's. I would recommend right femoral loop graft if he comes to me for long-term hemodialysis. We will follow along from the side lines. Await renal physicians decision on timing for long-term access placement

## 2013-10-12 NOTE — Progress Notes (Signed)
eLink Physician-Brief Progress Note Patient Name: [name] DOB: 1946/05/30 MRN: [id number]  Date of Service  10/12/2013   HPI/Events of Note  Constipation in the setting of renal failure.  Already on daily dulcolax   eICU Interventions  Plan: Sorbitol prn Dulcolax supp prn Colace daily   Intervention Category Minor Interventions: Routine modifications to care plan (e.g. PRN medications for pain, fever)  DETERDING,ELIZABETH 10/12/2013, 12:12 AM

## 2013-10-12 NOTE — Progress Notes (Signed)
Patient ID: [name], male   DOB: September 06, 1946, 67 y.o.   MRN: [id number]   [facility] Progress Note    Assessment/ Plan:   1. Acute renal failure on chronic kidney disease stage IV. 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 Plan for HD today- he has oliguric UOP and minimal clearance. I appreciate evaluation by vascular surgery for permanent access-he has unfortunately very limited access due to several skin lesions (malignant/premalignant/postoperative) in his upper extremities that primarily forces permanent access in his lower extremity. No evident renal recovery yet (possibly progressed to ESRD).  2. Shortness of breath/pericardial effusion:s/p pericardial window/effusion drainage- cytology benign and likely from uremia.  3. Anemia: anemia secondary to chronic kidney disease. On Aranesp and status post intravenous iron therapy  4. Metabolic bone disease: On calcitriol. Phosphorus normal (no binders indicated at this time) 5. Nutrition: Low albumin noted and likely to be reflective of worsening chronic kidney disease. Started on nutritional supplements   Subjective:   Reports to be feeling fair- denies CP/SOB   Objective:   BP 141/62  Pulse 81  Temp(Src) 98.3 F (36.8 C) (Oral)  Resp 19  Ht 6\' 2"  (1.88 m)  Wt 95.1 kg (209 lb 10.5 oz)  BMI 26.91 kg/m2  SpO2 95%  Intake/Output Summary (Last 24 hours) at 10/12/13 0756 Last data filed at 10/12/13 0600  Gross per 24 hour  Intake    800 ml  Output    110 ml  Net    690 ml   Weight change: 4.1 kg (9 lb 0.6 oz)  Physical Exam: NWG:NFAOZHYQMVH resting in bed QIO:NGEXB RRR, normal S1 and S2 Resp:Clear to auscultation, no rales/rhonchi MWU:XLKG, flat, NT Ext:1+ edema with several scabbed skin lesions UEs/LEs  Imaging: Ir Fluoro Guide Cv Line Left  10/10/2013   CLINICAL DATA:  Acute renal failure, heart failure, diabetes  EXAM: Ultrasound guidance for vascular access   Temporary left IJ TRIALYSIS catheter  Date:  10/10/2013 9:32 AM11/26/2014 9:32 AM  Radiologist:  Judie Petit. Ruel Favors, MD  Guidance:  ULTRASOUND AND FLUOROSCOPIC  MEDICATIONS AND MEDICAL HISTORY: 2 mg Versed, 100 mcg fentanyl  ANESTHESIA/SEDATION: 15 min  CONTRAST:  None.  FLUOROSCOPY TIME:  1 MIN  PROCEDURE: Informed consent was obtained from the patient following explanation of the procedure, risks, benefits and alternatives. The patient understands, agrees and consents for the procedure. All questions were addressed. A time out was performed.  Maximal barrier sterile technique utilized including caps, mask, sterile gowns, sterile gloves, large sterile drape, hand hygiene, and ChloraPrep  Under sterile conditions and local anesthesia, left IJ micropuncture venous access was performed with ultrasound. Images obtained for documentation. Guidewire advanced centrally along the pacer wires. Transitional dilator inserted. Guidewire exchanged for an Amplatz guidewire. Dilatation performed to insert a 24 cm temporaryTrialysis catheter. Tips at the [facility] RA junction. Images obtained for documentation. Blood aspirated easily from all 3 lumens followed by saline and heparin flushes. External caps applied. Catheter secured with Prolene sutures. Sterile dressing applied. No immediate complication. Patient tolerated the procedure well.  COMPLICATIONS: No immediate  IMPRESSION: Successful ultrasound and fluoroscopic left IJ temporary Trialysis catheter. Tips SVC RA junction. Ready for use   Electronically Signed   By: Ruel Favors M.D.   On: 10/10/2013 09:44   Ir US Guide Vasc Access Left  10/10/2013   CLINICAL DATA:  Acute renal failure, heart failure, diabetes  EXAM: Ultrasound guidance for vascular access  Temporary left IJ TRIALYSIS catheter  Date:  10/10/2013 9:32 AM11/26/2014 9:32 AM  Radiologist:  Judie Petit. Ruel Favors, MD  Guidance:  ULTRASOUND AND FLUOROSCOPIC  MEDICATIONS AND MEDICAL HISTORY: 2 mg Versed, 100 mcg fentanyl   ANESTHESIA/SEDATION: 15 min  CONTRAST:  None.  FLUOROSCOPY TIME:  1 MIN  PROCEDURE: Informed consent was obtained from the patient following explanation of the procedure, risks, benefits and alternatives. The patient understands, agrees and consents for the procedure. All questions were addressed. A time out was performed.  Maximal barrier sterile technique utilized including caps, mask, sterile gowns, sterile gloves, large sterile drape, hand hygiene, and ChloraPrep  Under sterile conditions and local anesthesia, left IJ micropuncture venous access was performed with ultrasound. Images obtained for documentation. Guidewire advanced centrally along the pacer wires. Transitional dilator inserted. Guidewire exchanged for an Amplatz guidewire. Dilatation performed to insert a 24 cm temporaryTrialysis catheter. Tips at the [facility] RA junction. Images obtained for documentation. Blood aspirated easily from all 3 lumens followed by saline and heparin flushes. External caps applied. Catheter secured with Prolene sutures. Sterile dressing applied. No immediate complication. Patient tolerated the procedure well.  COMPLICATIONS: No immediate  IMPRESSION: Successful ultrasound and fluoroscopic left IJ temporary Trialysis catheter. Tips SVC RA junction. Ready for use   Electronically Signed   By: Ruel Favors M.D.   On: 10/10/2013 09:44   Dg Chest Port 1 View  10/12/2013   CLINICAL DATA:  Pericardial effusion  EXAM: PORTABLE CHEST - 1 VIEW  COMPARISON:  10/10/2013  FINDINGS: Cardiac shadow remains enlarged. Bibasilar atelectatic changes are again noted. A right-sided central venous line is again identified. A pacing device and left-sided dialysis catheter are noted. The Trialysis Catheter tip is noted at the cavoatrial junction. No pneumothorax is seen. No other focal abnormality is noted.  IMPRESSION: New temporary dialysis catheter.  The remainder of the exam is stable.   Electronically Signed   By: Alcide Clever M.D.   On:  10/12/2013 07:49    Labs: BMET  Recent Labs Lab 10/08/13 0420 10/08/13 1200 10/09/13 0500 10/09/13 1015 10/10/13 0415 10/11/13 0500 10/12/13 0600  NA 136 135 135 132* 134* 135 132*  K 4.9 5.1 4.9 5.3* 4.8 3.8 4.0  CL 104 102 103 101 102 102 100  CO2 17* 17* 17* 14* 16* 20 19  GLUCOSE 83 149* 94 197* 203* 166* 128*  BUN 112* 114* 107* 110* 112* 88* 91*  CREATININE 3.45* 3.43* 3.41* 3.44* 3.65* 2.84* 2.87*  CALCIUM 8.3* 8.0* 8.2* 8.1* 8.4 8.2* 8.1*  PHOS  --   --   --   --   --   --  4.0   CBC  Recent Labs Lab 10/07/13 1902 10/08/13 0420 10/08/13 1200 10/09/13 0500 10/10/13 0415 10/12/13 0600  WBC 12.3* 10.8* 8.6 8.2 7.8 8.0  NEUTROABS 11.3* 9.8*  --   --   --   --   HGB 8.0* 7.8* 7.2* 7.2* 7.0* 7.5*  HCT 24.1* 24.9* 23.4* 22.0* 21.8* 22.8*  MCV 93.1 95.4 94.4 94.4 92.8 91.2  PLT 104* 124* 103* 93* 95* 109*    Medications:    . ALPRAZolam  0.25 mg Oral BID  . amLODipine  5 mg Oral BID  . bisacodyl  10 mg Oral Daily  . calcitRIOL  0.5 mcg Oral Daily  . carvedilol  12.5 mg Oral BID WC  . darbepoetin (ARANESP) injection - DIALYSIS  60 mcg Intravenous Q Wed-HD  . docusate sodium  100 mg Oral Daily  . gabapentin  300 mg  Oral QHS  . insulin aspart  0-15 Units Subcutaneous TID WC  . insulin aspart  0-5 Units Subcutaneous QHS  . mycophenolate  500 mg Oral BID  . pantoprazole  40 mg Oral Daily  . predniSONE  5 mg Oral BID WC  . sodium chloride  3 mL Intravenous Q12H    Zetta Bills, MD 10/12/2013, 7:56 AM

## 2013-10-12 NOTE — Progress Notes (Signed)
Rehab Admissions Coordinator Note:  Patient was screened by Trish Mage for appropriateness for an Inpatient Acute Rehab Consult.  At this time, we are recommending Inpatient Rehab consult.  Trish Mage 10/12/2013, 1:18 PM  I can be reached at [phone].

## 2013-10-12 NOTE — Progress Notes (Addendum)
      [address]       [place] [address]             [phone]      4 Days Post-Op Procedure(s) (LRB): SUBXYPHOID PERICARDIAL WINDOW (N/A)  Subjective:  No acute issues, dialysis today per Nephro  Objective: Vital signs in last 24 hours: Temp:  [98.3 F (36.8 C)-98.6 F (37 C)] 98.3 F (36.8 C) (11/28 0456) Pulse Rate:  [69-81] 81 (11/28 0456) Cardiac Rhythm:  [-] Ventricular paced (11/27 2000) Resp:  [13-22] 19 (11/28 0456) BP: (117-161)/(50-86) 141/62 mmHg (11/28 0456) SpO2:  [94 %-100 %] 95 % (11/28 0456) Weight:  [209 lb 10.5 oz (95.1 kg)] 209 lb 10.5 oz (95.1 kg) (11/28 0456)  Intake/Output from previous day: 11/27 0701 - 11/28 0700 In: 800 [P.O.:720; I.V.:80] Out: 110 [Urine:110]  General appearance: alert, cooperative and no distress Heart: regular rate and rhythm Lungs: clear to auscultation bilaterally Wound: clean and dry  Lab Results:  Recent Labs  10/10/13 0415 10/12/13 0600  WBC 7.8 8.0  HGB 7.0* 7.5*  HCT 21.8* 22.8*  PLT 95* 109*   BMET:  Recent Labs  10/11/13 0500 10/12/13 0600  NA 135 132*  K 3.8 4.0  CL 102 100  CO2 20 19  GLUCOSE 166* 128*  BUN 88* 91*  CREATININE 2.84* 2.87*  CALCIUM 8.2* 8.1*    PT/INR: No results found for this basename: LABPROT, INR,  in the last 72 hours ABG    Component Value Date/Time   PHART 7.310* 10/09/2013 0503   HCO3 16.7* 10/09/2013 0503   TCO2 18 10/09/2013 0503   ACIDBASEDEF 9.0* 10/09/2013 0503   O2SAT 93.0 10/09/2013 0503   CBG (last 3)   Recent Labs  10/11/13 1212 10/11/13 1746 10/11/13 2256  GLUCAP 182* 158* 152*    Assessment/Plan: S/P Procedure(s) (LRB): SUBXYPHOID PERICARDIAL WINDOW (N/A)  1. Pericardial drain has been removed, CXR stable 2. Dispo- care per primary, will follow up with patient on outpatient basis, appointment will be placed in chart   LOS: 5 days    Lowella Dandy 10/12/2013   Chart reviewed, patient examined, agree with  above.

## 2013-10-12 NOTE — Evaluation (Signed)
Physical Therapy Evaluation Patient Details Name: DAVIONE LENKER MRN: [id number] DOB: 1946/04/19 Today's Date: 10/12/2013 Time: 1032-1100 PT Time Calculation (min): 28 min  PT Assessment / Plan / Recommendation History of Present Illness  Patient is a 67 yo male admitted with hypoglycemia and pericardial window.  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  Clinical Impression  Pt admitted with above. Pt currently with functional limitations due to the deficits listed below (see PT Problem List). Pt will benefit from Rehab prior to d/c home.   Pt will benefit from skilled PT to increase their independence and safety with mobility to allow discharge to the venue listed below.     PT Assessment  Patient needs continued PT services    Follow Up Recommendations  CIR;Supervision/Assistance - 24 hour                Equipment Recommendations  None recommended by PT    Recommendations for Other Services Rehab consult   Frequency Min 3X/week    Precautions / Restrictions Precautions Precautions: Fall Restrictions Weight Bearing Restrictions: No   Pertinent Vitals/Pain VSS, no specific pain      Mobility  Bed Mobility Bed Mobility: Rolling Left;Left Sidelying to Sit;Sitting - Scoot to [place] of Bed Rolling Left: 4: Min assist;With rail Left Sidelying to Sit: 3: Mod assist;With rails;HOB flat Sitting - Scoot to Edge of Bed: 3: Mod assist Details for Bed Mobility Assistance: Pt needed assist for LEs and elevation of trunk. Transfers Transfers: Sit to Stand;Stand to Sit;Stand Pivot Transfers Sit to Stand: 3: Mod assist;From elevated surface;With upper extremity assist;From bed Stand to Sit: 3: Mod assist;With upper extremity assist;To chair/3-in-1;With armrests Stand Pivot Transfers: 3: Mod assist Details for Transfer Assistance: Pt needed cues for hand placement.  Raised bed fairly high so pt could stand from bed.  Pt had  BM and urine under him therefore nursing cleaned pt while pt holding RW.  Needed guidance with RW and sequencing steps to pivot to recliner.   Ambulation/Gait Ambulation/Gait Assistance: Not tested (comment) Stairs: No Wheelchair Mobility Wheelchair Mobility: No    Exercises General Exercises - Lower Extremity Ankle Circles/Pumps: AROM;Both;5 reps;Supine Heel Slides: AAROM;Both;5 reps;Supine   PT Diagnosis: Generalized weakness  PT Problem List: Decreased activity tolerance;Decreased balance;Decreased strength;Decreased mobility;Decreased knowledge of use of DME;Decreased safety awareness;Decreased knowledge of precautions PT Treatment Interventions: DME instruction;Gait training;Functional mobility training;Therapeutic activities;Therapeutic exercise;Balance training;Patient/family education     PT Goals(Current goals can be found in the care plan section) Acute Rehab PT Goals Patient Stated Goal: to go home  PT Goal Formulation: With patient Time For Goal Achievement: 10/31/2013 Potential to Achieve Goals: Good  Visit Information  Last PT Received On: 10/12/13 Assistance Needed: +2 History of Present Illness: Patient is a 67 yo male admitted with hypoglycemia and pericardial window.  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       Prior Functioning  Home Living Family/patient expects to be discharged to:: Private residence Living Arrangements: Spouse/significant other Available Help at Discharge: Family;Available 24 hours/day Type of Home: House Home Access: Level entry Home Layout: One level Home Equipment: Walker - 2 wheels;Cane - single point;Wheelchair - manual;Shower seat;Grab bars - toilet;Grab bars - tub/shower;Shower seat - built in;Hand held shower head (handicapped toilet) Additional Comments: Pt and wife built a fully handicap accessible house.  Prior Function Level of Independence: Needs  assistance Gait /  Transfers Assistance Needed: Wife provides supervision for ambulation.  Patient uses RW ADL's / Homemaking Assistance Needed: Assist for meal prep, housekeeping, bathing Communication Communication: No difficulties Dominant Hand: Left    Cognition  Cognition Arousal/Alertness: Awake/alert Behavior During Therapy: WFL for tasks assessed/performed Overall Cognitive Status: Within Functional Limits for tasks assessed    Extremity/Trunk Assessment Upper Extremity Assessment Upper Extremity Assessment: Defer to OT evaluation Lower Extremity Assessment Lower Extremity Assessment: RLE deficits/detail;LLE deficits/detail RLE Deficits / Details: grossly 3/5 LLE Deficits / Details: grossly 3/5   Balance Balance Balance Assessed: Yes Static Sitting Balance Static Sitting - Balance Support: Bilateral upper extremity supported;Feet supported Static Sitting - Level of Assistance: 5: Stand by assistance Static Sitting - Comment/# of Minutes: 3 Static Standing Balance Static Standing - Balance Support: Bilateral upper extremity supported;During functional activity Static Standing - Level of Assistance: 4: Min assist Static Standing - Comment/# of Minutes: Stood with RW for 2 min to be cleaned.  End of Session PT - End of Session Equipment Utilized During Treatment: Gait belt Activity Tolerance: Patient limited by fatigue Patient left: in chair;with call bell/phone within reach;with nursing/sitter in room Nurse Communication: Mobility status;Need for lift equipment       INGOLD,Eleonor Ocon 10/12/2013, 1:03 PM [facility] Acute Rehabilitation [phone] (pager)

## 2013-10-13 ENCOUNTER — Inpatient Hospital Stay (HOSPITAL_COMMUNITY): Payer: Medicare Other

## 2013-10-13 DIAGNOSIS — I4891 Unspecified atrial fibrillation: Secondary | ICD-10-CM

## 2013-10-13 LAB — BASIC METABOLIC PANEL
BUN: 47 mg/dL — ABNORMAL HIGH (ref 6–23)
CO2: 20 mEq/L (ref 19–32)
Calcium: 6.9 mg/dL — ABNORMAL LOW (ref 8.4–10.5)
Chloride: 107 mEq/L (ref 96–112)
Creatinine, Ser: 1.88 mg/dL — ABNORMAL HIGH (ref 0.50–1.35)
GFR calc Af Amer: 42 mL/min — ABNORMAL LOW (ref >90)
GFR calc non Af Amer: 36 mL/min — ABNORMAL LOW (ref >90)
Glucose, Bld: 96 mg/dL (ref 70–99)
Potassium: 3 mEq/L — ABNORMAL LOW (ref 3.5–5.1)
Sodium: 139 mEq/L (ref 135–145)

## 2013-10-13 LAB — GLUCOSE, CAPILLARY
Glucose-Capillary: 110 mg/dL — ABNORMAL HIGH (ref 70–99)
Glucose-Capillary: 141 mg/dL — ABNORMAL HIGH (ref 70–99)

## 2013-10-13 LAB — MAGNESIUM: Magnesium: 1.4 mg/dL — ABNORMAL LOW (ref 1.5–2.5)

## 2013-10-13 MED ORDER — CARVEDILOL 25 MG PO TABS
25.0000 mg | ORAL_TABLET | Freq: Two times a day (BID) | ORAL | Status: DC
  Administered 2013-10-13 – 2013-10-17 (×6): 25 mg via ORAL
  Filled 2013-10-13 (×11): qty 1

## 2013-10-13 MED ORDER — ASPIRIN 81 MG PO CHEW
81.0000 mg | CHEWABLE_TABLET | Freq: Every day | ORAL | Status: DC
  Administered 2013-10-13 – 2013-10-17 (×4): 81 mg via ORAL
  Filled 2013-10-13 (×5): qty 1

## 2013-10-13 MED ORDER — ATORVASTATIN CALCIUM 80 MG PO TABS
80.0000 mg | ORAL_TABLET | Freq: Every day | ORAL | Status: DC
Start: 2013-10-13 — End: 2013-10-17
  Administered 2013-10-13 – 2013-10-15 (×3): 80 mg via ORAL
  Filled 2013-10-13 (×6): qty 1

## 2013-10-13 MED ORDER — ALPRAZOLAM 0.5 MG PO TABS
ORAL_TABLET | ORAL | Status: AC
  Filled 2013-10-13: qty 1

## 2013-10-13 MED ORDER — HEPARIN SODIUM (PORCINE) 1000 UNIT/ML DIALYSIS
2000.0000 [IU] | INTRAMUSCULAR | Status: DC | PRN
  Filled 2013-10-13: qty 2

## 2013-10-13 NOTE — Progress Notes (Addendum)
Patient ID: [name], male   DOB: June 04, 1946, 67 y.o.   MRN: [id number]    SUBJECTIVE:  No chest pain or dyspnea.    Filed Vitals:   10/13/13 0352 10/13/13 0644 10/13/13 0800 10/13/13 0821  BP:  124/66 121/61 121/61  Pulse:  70 36 125  Temp:    99.2 F (37.3 C)  TempSrc:    Oral  Resp:  24 21   Height:      Weight: 194 lb 7.1 oz (88.2 kg)     SpO2:  98% 97%      Intake/Output Summary (Last 24 hours) at 10/13/13 1147 Last data filed at 10/13/13 0931  Gross per 24 hour  Intake    990 ml  Output   2275 ml  Net  -1285 ml    LABS: Basic Metabolic Panel:  Recent Labs  40/98/11 0600 10/12/13 1523 10/13/13 0700  NA 132* 132* 139  K 4.0 4.0 3.0*  CL 100 99 107  CO2 19 19 20   GLUCOSE 128* 171* 96  BUN 91* 86* 47*  CREATININE 2.87* 2.89* 1.88*  CALCIUM 8.1* 8.0* 6.9*  MG 1.7  --  1.4*  PHOS 4.0 3.8  --    Liver Function Tests:  Recent Labs  10/12/13 1523  ALBUMIN 2.0*  CBC:  Recent Labs  10/12/13 0600 10/12/13 1523  WBC 8.0 8.3  HGB 7.5* 7.2*  HCT 22.8* 21.7*  MCV 91.2 91.9  PLT 109* 95*    RADIOLOGY: Dg Chest 2 View  10/07/2013   CLINICAL DATA:  Altered mental status  EXAM: CHEST  2 VIEW  COMPARISON:  04/11/2013  FINDINGS: Severe cardiac enlargement. This is stable. Pacer is unchanged. Lungs are clear.  IMPRESSION: No active cardiopulmonary disease. Stable severe cardiac enlargement.   Electronically Signed   By: Esperanza Heir M.D.   On: 10/07/2013 21:24   Dg Chest Portable 1 View  10/08/2013   CLINICAL DATA:  Postop for central line placement. Renal transplant.  EXAM: PORTABLE CHEST - 1 VIEW  COMPARISON:  10/07/2013  FINDINGS: Pacer/AICD device which is unchanged.  Right internal jugular line which terminates at the mid to low SVC.  Midline trachea. Cardiomegaly accentuated by AP portable technique. Small left and possible small right pleural effusions. No pneumothorax. Diminished lung volumes. Development of mild pulmonary venous congestion.  Bibasilar left greater than right airspace disease. Similar, given differences in technique.  IMPRESSION: Right IJ central line terminating at mid to low SVC, without pneumothorax.  Worsened aeration, with development of mild pulmonary venous congestion and decreased lung volumes.  Similar small left and possible right pleural effusions with adjacent atelectasis or infection.   Electronically Signed   By: Jeronimo Greaves M.D.   On: 10/08/2013 17:50    PHYSICAL EXAM  patient is resting. Cardiac exam reveals S1-S2. Lungs reveal scattered rhonchi.   TELEMETRY: ventricular pacing (probable atrial fibrillation)   ASSESSMENT AND PLAN:    History of renal transplant    HTN (hypertension)    Diabetes mellitus type 2, controlled    CAD (coronary artery disease)        Coronary disease is stable at this time. No further workup recommended. Resume ASA and statin.    Renal failure (ARF), acute on chronic           Now being dialyzed    Pericardial effusion     S/p pericardial window    Anemia of chronic kidney disease     Tachycardia- telemetry reviewed;  patient most likely having PAF; will have device interrogated to confirm; dc norvasc and increase coreg. If atrial fibrillation confirmed, would need coumadin long term as he has multiple risk factors. EF 40-45. Check TSH.   Olga Millers 10/13/2013 11:47 AM

## 2013-10-13 NOTE — Progress Notes (Signed)
Patient ID: [name], male   DOB: 1946/05/05, 67 y.o.   MRN: [id number]   [facility] Progress Note    Assessment/ Plan:   1. Acute renal failure on chronic kidney disease stage IV. 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 Plan for HD today- he has anuric UOP and minimal clearance. No evident renal recovery yet (possibly progressed to ESRD). Only option for permanent access is a thigh AVG- will have him converted to a TDC soon 2. Shortness of breath/pericardial effusion:s/p pericardial window/effusion drainage- cytology benign and likely from uremia.  3. Anemia: anemia secondary to chronic kidney disease. On Aranesp and status post intravenous iron therapy  4. Metabolic bone disease: On calcitriol. Phosphorus normal (no binders indicated at this time)  5. Nutrition: Low albumin noted and likely to be reflective of worsening chronic kidney disease. Started on nutritional supplements  Subjective:   Upset about problems at dialysis yesterday   Objective:   BP 121/61  Pulse 36  Temp(Src) 99.3 F (37.4 C) (Oral)  Resp 21  Ht 6\' 2"  (1.88 m)  Wt 88.2 kg (194 lb 7.1 oz)  BMI 24.95 kg/m2  SpO2 97%  Intake/Output Summary (Last 24 hours) at 10/13/13 4098 Last data filed at 10/12/13 2230  Gross per 24 hour  Intake    720 ml  Output   2100 ml  Net  -1380 ml   Weight change: -5.4 kg (-11 lb 14.5 oz)  Physical Exam: JXB:[id number] resting in bed YQM:VHQIO regular bradycardia, normal S1 and S2 Resp:Clear bilaterally, no rales/rhonchi NGE:XBMW, obese, NT UXL:KGMWN-0+ LE edema  Imaging: Dg Chest Port 1 View  10/13/2013   CLINICAL DATA:  Ventricular tachycardia.  EXAM: PORTABLE CHEST - 1 VIEW  COMPARISON:  10/12/2013.  FINDINGS: Right IJ line, dialysis catheter, cardiac pacer in stable position. Cardiomegaly. No pulmonary venous congestion. Persistent unchanged bibasilar atelectasis. No pneumothorax.  IMPRESSION: 1. Stable  support lines.  Chest is unchanged from 10/12/2013. 2. Persistent bibasilar subsegmental atelectasis.   Electronically Signed   By: Maisie Fus  Register   On: 10/13/2013 07:56   Dg Chest Port 1 View  10/12/2013   CLINICAL DATA:  Pericardial effusion  EXAM: PORTABLE CHEST - 1 VIEW  COMPARISON:  10/10/2013  FINDINGS: Cardiac shadow remains enlarged. Bibasilar atelectatic changes are again noted. A right-sided central venous line is again identified. A pacing device and left-sided dialysis catheter are noted. The Trialysis Catheter tip is noted at the cavoatrial junction. No pneumothorax is seen. No other focal abnormality is noted.  IMPRESSION: New temporary dialysis catheter.  The remainder of the exam is stable.   Electronically Signed   By: Alcide Clever M.D.   On: 10/12/2013 07:49    Labs: BMET  Recent Labs Lab 10/08/13 1200 10/09/13 0500 10/09/13 1015 10/10/13 0415 10/11/13 0500 10/12/13 0600 10/12/13 1523  NA 135 135 132* 134* 135 132* 132*  K 5.1 4.9 5.3* 4.8 3.8 4.0 4.0  CL 102 103 101 102 102 100 99  CO2 17* 17* 14* 16* 20 19 19   GLUCOSE 149* 94 197* 203* 166* 128* 171*  BUN 114* 107* 110* 112* 88* 91* 86*  CREATININE 3.43* 3.41* 3.44* 3.65* 2.84* 2.87* 2.89*  CALCIUM 8.0* 8.2* 8.1* 8.4 8.2* 8.1* 8.0*  PHOS  --   --   --   --   --  4.0 3.8   CBC  Recent Labs Lab 10/07/13 1902 10/08/13 0420  10/09/13 0500 10/10/13 0415 10/12/13 0600 10/12/13 1523  WBC 12.3* 10.8*  < > 8.2 7.8 8.0 8.3  NEUTROABS 11.3* 9.8*  --   --   --   --   --   HGB 8.0* 7.8*  < > 7.2* 7.0* 7.5* 7.2*  HCT 24.1* 24.9*  < > 22.0* 21.8* 22.8* 21.7*  MCV 93.1 95.4  < > 94.4 92.8 91.2 91.9  PLT 104* 124*  < > 93* 95* 109* 95*  < > = values in this interval not displayed.  Medications:    . ALPRAZolam  0.25 mg Oral BID  . amLODipine  5 mg Oral BID  . bisacodyl  10 mg Oral Daily  . calcitRIOL  0.5 mcg Oral Daily  . carvedilol  12.5 mg Oral BID WC  . darbepoetin (ARANESP) injection - DIALYSIS  60 mcg  Intravenous Q Wed-HD  . docusate sodium  100 mg Oral Daily  . gabapentin  300 mg Oral QHS  . heparin subcutaneous  5,000 Units Subcutaneous Q8H  . insulin aspart  0-15 Units Subcutaneous TID WC  . insulin aspart  0-5 Units Subcutaneous QHS  . mycophenolate  500 mg Oral BID  . pantoprazole  40 mg Oral Daily  . predniSONE  5 mg Oral BID WC  . sodium chloride  3 mL Intravenous Q12H   Zetta Bills, MD 10/13/2013, 8:12 AM

## 2013-10-13 NOTE — Progress Notes (Signed)
Pt pacemaker failed to pace and had 18 runs of vtach. PCCM notified and told to call cardiothoracic sx. Pt had three more episodes of pacer failing to pace. HR up to 212, vtach. Pt states he feels "warm but ok." All other VSS. Donata Clay, MD notified. New orders received for stat portable CXR and labs.  Labs sent. AM RN notified. Will continue to monitor.  M.Foster Simpson, RN

## 2013-10-13 NOTE — Procedures (Signed)
Patient seen on Hemodialysis. QB 300, UF goal 2500 Treatment adjusted as needed.  Zetta Bills MD [facility]. Office # [phone] Pager # [phone] 5:10 PM

## 2013-10-13 NOTE — Progress Notes (Signed)
CT surgery rounds  Patient recovering from subxiphoid pericardial window for large effusion secondary to uremia  Surgical incisions well-healed  Chest x-ray today is reviewed and shows no evidence of recurrent effusion  Tachycardia-arrhythmias evaluated by cardiology and felt to be atrial fibrillation with aberrancy. Low potassium 3.0 today also noted with low magnesium 1.4.  Will follow

## 2013-10-13 NOTE — Progress Notes (Addendum)
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 He also has a history of coronary artery disease, systolic CHF, insulin-dependent diabetes mellitus, hypertension s/p pacemaker. The patient came to the hospital for lethargy related to hypoglycemia and was found to have a large pericardial effusion which is mentioned to be chronic suspected to be related to uremia. He did not have tamponade features. He underwent a pericardial window on 11/24..  Due to a failing transplant, he was initiated on dialysis on 11/26.  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 -Continue immunosuppressants per nephrology  Arrythmia / Pacemaker - runs of tachycardia- cont Coreg for now-  will have cardiology evalutate  Chronic pericardial effusion  -s/p pericardial window - Chest tube has been removed and patient is stable  Severe seborrhoic dermatitis/ Multiple skin cancers -as a result of chronic immunosuppression - follows with dermatology regularly - non-healing ulcer on post right upper arm due to resection of skin cancer - follow     HTN (hypertension) - controlled on current medication    Diabetes mellitus type 2, controlled -  Sugars controlled on current treatment -   Hypoglycemia was noted on admission- has resolved    Acute on chronic systolic CHF  -Apparently was 18 pounds overweight on admission when compared with last month - He was on Lasix at home  -Fluid status is now being managed by renal via dialysis.   Code Status: Full code Family Communication:  None Disposition Plan: Follow in step down unit  Consultants: Nephrology CT surgery Vascular surgery  Procedures: 11/24 TTE >>> Large pericardial effusion without tamponade, EF 40-45%  11/24 OR >>> Pericardial window  11/26  Left IJ trialysis cath placed by IR   Antibiotics: Zinacef 11/24 >>> 11/25   DVT prophylaxis: Heparin  HPI/Subjective: Patient sleepy but awakens easily and is oriented. He has no complaints   Objective: Blood pressure 121/61, pulse 36, temperature 99.3 F (37.4 C), temperature source Oral, resp. rate 21, height 6\' 2"  (1.88 m), weight 88.2 kg (194 lb 7.1 oz), SpO2 97.00%.  Intake/Output Summary (Last 24 hours) at 10/13/13 0821 Last data filed at 10/12/13 2230  Gross per 24 hour  Intake    720 ml  Output   2100 ml  Net  -1380 ml     Exam: General: No acute respiratory distress Lungs: Clear to auscultation bilaterally without wheezes or crackles Cardiovascular: Regular rate and rhythm without murmur gallop or rub normal S1 and S2 Abdomen: Nontender, nondistended, soft, bowel sounds positive, no rebound, no ascites, no appreciable mass Extremities: No significant cyanosis, clubbing, or edema bilateral lower extremities  Data Reviewed: Basic Metabolic Panel:  Recent Labs Lab 10/10/13 0415 10/11/13 0500 10/12/13 0600 10/12/13 1523 10/13/13 0700  NA 134* 135 132* 132* 139  K 4.8 3.8 4.0 4.0 3.0*  CL 102 102 100 99 107  CO2 16* 20 19 19 20   GLUCOSE 203* 166* 128* 171* 96  BUN 112* 88* 91* 86* 47*  CREATININE 3.65* 2.84* 2.87* 2.89* 1.88*  CALCIUM 8.4 8.2* 8.1* 8.0* 6.9*  MG  --   --  1.7  --  1.4*  PHOS  --   --  4.0 3.8  --    Liver Function Tests:  Recent Labs Lab 10/07/13 1902 10/08/13 0420 10/08/13 1200 10/10/13 0415 10/12/13 1523  AST 25 21 16 12   --   ALT 22 23 19 15   --   ALKPHOS 58 58 55 52  --   BILITOT 0.3 0.2* 0.2* 0.2*  --   PROT 4.7* 5.0* 4.7* 4.3*  --   ALBUMIN 2.7* 2.6* 2.4* 2.0* 2.0*   No results found for  this basename: LIPASE, AMYLASE,  in the last 168 hours No results found for this basename: AMMONIA,  in the last 168 hours CBC:  Recent Labs Lab 10/07/13 1902 10/08/13 0420 10/08/13 1200 10/09/13 0500 10/10/13 0415 10/12/13 0600 10/12/13 1523  WBC 12.3* 10.8* 8.6 8.2 7.8 8.0 8.3  NEUTROABS 11.3* 9.8*  --   --   --   --   --   HGB 8.0* 7.8* 7.2* 7.2* 7.0* 7.5* 7.2*  HCT 24.1* 24.9* 23.4* 22.0* 21.8* 22.8* 21.7*  MCV 93.1 95.4 94.4 94.4 92.8 91.2 91.9  PLT 104* 124* 103* 93* 95* 109* 95*   Cardiac Enzymes:  Recent Labs Lab 10/07/13 2008 10/08/13 0420 10/08/13 0825 10/08/13 1405  TROPONINI <0.30 <0.30 <0.30 <0.30   BNP (last 3 results)  Recent Labs  04/09/13 1856 10/08/13 0825  PROBNP 21940.0* 10879.0*   CBG:  Recent Labs Lab 10/11/13 2256 10/12/13 0902 10/12/13 1250 10/12/13 2228 10/13/13 0811  GLUCAP 152* 119* 127* 107* 110*    Recent Results (from the past 240 hour(s))  MRSA PCR SCREENING     Status: None   Collection Time    10/08/13  2:45 AM      Result Value Range Status   MRSA by PCR NEGATIVE  NEGATIVE Final   Comment:            The GeneXpert MRSA Assay (FDA     approved for NASAL specimens     only), is one component of a     comprehensive MRSA colonization     surveillance program. It is not     intended to diagnose MRSA     infection nor to guide or     monitor treatment for     MRSA infections.  AFB CULTURE WITH SMEAR     Status: None   Collection Time    10/08/13  2:50 PM      Result Value Range Status   Specimen Description FLUID PERICARDIAL   Final   Special Requests NONE   Final   ACID FAST SMEAR     Final   Value: NO ACID FAST BACILLI SEEN     Performed at [facility]   Culture     Final   Value: CULTURE WILL BE EXAMINED FOR 6 WEEKS BEFORE ISSUING A FINAL REPORT     Performed at [facility]   Report Status PENDING   Incomplete  BODY FLUID CULTURE     Status: None   Collection Time    10/08/13  2:50 PM       Result Value Range Status   Specimen Description FLUID PERICARDIAL   Final   Special Requests NONE   Final   Gram Stain     Final   Value: RARE WBC PRESENT,BOTH PMN AND MONONUCLEAR     NO ORGANISMS SEEN     Performed at [facility]     Performed at [facility]   Culture     Final  Value: NO GROWTH 3 DAYS     Performed at [facility]   Report Status 10/11/2013 FINAL   Final  GRAM STAIN     Status: None   Collection Time    10/08/13  2:50 PM      Result Value Range Status   Specimen Description FLUID PERICARDIAL   Final   Special Requests NONE   Final   Gram Stain     Final   Value: RARE WBC PRESENT,BOTH PMN AND MONONUCLEAR     NO ORGANISMS SEEN   Report Status 10/08/2013 FINAL   Final     Studies:  Recent x-ray studies have been reviewed in detail by the Attending Physician  Scheduled Meds:  Scheduled Meds: . ALPRAZolam  0.25 mg Oral BID  . amLODipine  5 mg Oral BID  . bisacodyl  10 mg Oral Daily  . calcitRIOL  0.5 mcg Oral Daily  . carvedilol  12.5 mg Oral BID WC  . darbepoetin (ARANESP) injection - DIALYSIS  60 mcg Intravenous Q Wed-HD  . docusate sodium  100 mg Oral Daily  . gabapentin  300 mg Oral QHS  . heparin subcutaneous  5,000 Units Subcutaneous Q8H  . insulin aspart  0-15 Units Subcutaneous TID WC  . insulin aspart  0-5 Units Subcutaneous QHS  . mycophenolate  500 mg Oral BID  . pantoprazole  40 mg Oral Daily  . predniSONE  5 mg Oral BID WC  . sodium chloride  3 mL Intravenous Q12H   Continuous Infusions:   Time spent on care of this patient: 35 min   Calvert Cantor, MD  [facility] Office  [phone] Pager - Text Page per Loretha Stapler as per below:  On-Call/Text Page:      [email or web address]      password [email or web address]  If 7PM-7AM, please contact night-coverage [email or web address] Password [email or web address] 10/13/2013, 8:21 AM   LOS: 6 days

## 2013-10-14 LAB — GLUCOSE, CAPILLARY
Glucose-Capillary: 155 mg/dL — ABNORMAL HIGH (ref 70–99)
Glucose-Capillary: 202 mg/dL — ABNORMAL HIGH (ref 70–99)
Glucose-Capillary: 145 mg/dL — ABNORMAL HIGH (ref 70–99)

## 2013-10-14 LAB — BASIC METABOLIC PANEL
GFR calc Af Amer: 49 mL/min — ABNORMAL LOW (ref >90)
GFR calc non Af Amer: 42 mL/min — ABNORMAL LOW (ref >90)
Potassium: 3.6 mEq/L (ref 3.5–5.1)
Sodium: 135 mEq/L (ref 135–145)

## 2013-10-14 LAB — TSH: TSH: 1.385 u[IU]/mL (ref 0.350–4.500)

## 2013-10-14 MED ORDER — ZOLPIDEM TARTRATE 5 MG PO TABS
5.0000 mg | ORAL_TABLET | Freq: Every evening | ORAL | Status: DC | PRN
  Administered 2013-10-14 – 2013-10-16 (×3): 5 mg via ORAL
  Filled 2013-10-14 (×3): qty 1

## 2013-10-14 MED ORDER — OXYCODONE HCL 5 MG PO TABS
5.0000 mg | ORAL_TABLET | ORAL | Status: DC | PRN
  Administered 2013-10-14 – 2013-10-17 (×12): 10 mg via ORAL
  Filled 2013-10-14 (×11): qty 2

## 2013-10-14 MED ORDER — WARFARIN SODIUM 5 MG PO TABS
5.0000 mg | ORAL_TABLET | Freq: Once | ORAL | Status: AC
  Administered 2013-10-14: 5 mg via ORAL
  Filled 2013-10-14: qty 1

## 2013-10-14 MED ORDER — WARFARIN - PHARMACIST DOSING INPATIENT: Freq: Every day | Status: DC

## 2013-10-14 NOTE — Progress Notes (Signed)
Patient ID: [name], male   DOB: 11/23/1945, 67 y.o.   MRN: [id number]  [facility] Progress Note    Assessment/ Plan:   1. Acute renal failure on chronic kidney disease stage IV. 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 UOP in non-oliguric range ( overnight). HD done yesterday and planning for next HD Tuesday unless acutely needed. It appears that he has possibly progressed to ESRD-would be in a better position to determine this over the next 48hrs (with labs /UOP). Only option for permanent access is a thigh AVG- will have him converted to a TDC prior to DC. I have started the process for OP HD unit placement [employer] as he lives in [place]) in case he declares himself to be ESRD. 2. Shortness of breath/pericardial effusion:s/p pericardial window/effusion drainage- cytology benign and likely from uremia.  3. Anemia: anemia secondary to chronic kidney disease. On Aranesp and status post intravenous iron therapy  4. Metabolic bone disease: On calcitriol. Phosphorus normal (no binders indicated at this time)  5. Nutrition: Low albumin noted and likely to be reflective of worsening chronic kidney disease. Started on nutritional supplements  Subjective:   Reports to be feeling better today and optimistic for some renal recovery with increased urine output. Inquires about KTx from LRD (son)   Objective:   BP 140/58  Pulse 114  Temp(Src) 98.8 F (37.1 C) (Oral)  Resp 17  Ht 6\' 2"  (1.88 m)  Wt 84.7 kg (186 lb 11.7 oz)  BMI 23.96 kg/m2  SpO2 97%  Intake/Output Summary (Last 24 hours) at 10/14/13 0842 Last data filed at 10/14/13 0557  Gross per 24 hour  Intake    630 ml  Output   2854 ml  Net  -2224 ml   Weight change: -5 kg (-11 lb 0.4 oz)  Physical Exam: NWG:NFAOZHYQMVH resting in bed QIO:NGEXB RRR, normal S1 and S2 Resp:CTA bilaterally, no rales/rhonchi MWU:XLKG, NT, BS normal Ext:1+ LE  edema  Imaging: Dg Chest Port 1 View  10/13/2013   CLINICAL DATA:  Ventricular tachycardia.  EXAM: PORTABLE CHEST - 1 VIEW  COMPARISON:  10/12/2013.  FINDINGS: Right IJ line, dialysis catheter, cardiac pacer in stable position. Cardiomegaly. No pulmonary venous congestion. Persistent unchanged bibasilar atelectasis. No pneumothorax.  IMPRESSION: 1. Stable support lines.  Chest is unchanged from 10/12/2013. 2. Persistent bibasilar subsegmental atelectasis.   Electronically Signed   By: Maisie Fus  Register   On: 10/13/2013 07:56    Labs: BMET  Recent Labs Lab 10/09/13 0500 10/09/13 1015 10/10/13 0415 10/11/13 0500 10/12/13 0600 10/12/13 1523 10/13/13 0700  NA 135 132* 134* 135 132* 132* 139  K 4.9 5.3* 4.8 3.8 4.0 4.0 3.0*  CL 103 101 102 102 100 99 107  CO2 17* 14* 16* 20 19 19 20   GLUCOSE 94 197* 203* 166* 128* 171* 96  BUN 107* 110* 112* 88* 91* 86* 47*  CREATININE 3.41* 3.44* 3.65* 2.84* 2.87* 2.89* 1.88*  CALCIUM 8.2* 8.1* 8.4 8.2* 8.1* 8.0* 6.9*  PHOS  --   --   --   --  4.0 3.8  --    CBC  Recent Labs Lab 10/07/13 1902 10/08/13 0420  10/09/13 0500 10/10/13 0415 10/12/13 0600 10/12/13 1523  WBC 12.3* 10.8*  < > 8.2 7.8 8.0 8.3  NEUTROABS 11.3* 9.8*  --   --   --   --   --   HGB 8.0* 7.8*  < > 7.2* 7.0* 7.5* 7.2*  HCT  24.1* 24.9*  < > 22.0* 21.8* 22.8* 21.7*  MCV 93.1 95.4  < > 94.4 92.8 91.2 91.9  PLT 104* 124*  < > 93* 95* 109* 95*  < > = values in this interval not displayed.  Medications:    . ALPRAZolam  0.25 mg Oral BID  . aspirin  81 mg Oral Daily  . atorvastatin  80 mg Oral q1800  . bisacodyl  10 mg Oral Daily  . calcitRIOL  0.5 mcg Oral Daily  . carvedilol  25 mg Oral BID WC  . darbepoetin (ARANESP) injection - DIALYSIS  60 mcg Intravenous Q Wed-HD  . docusate sodium  100 mg Oral Daily  . gabapentin  300 mg Oral QHS  . heparin subcutaneous  5,000 Units Subcutaneous Q8H  . insulin aspart  0-15 Units Subcutaneous TID WC  . insulin aspart  0-5 Units  Subcutaneous QHS  . mycophenolate  500 mg Oral BID  . pantoprazole  40 mg Oral Daily  . predniSONE  5 mg Oral BID WC  . sodium chloride  3 mL Intravenous Q12H   Zetta Bills, MD 10/14/2013, 8:42 AM

## 2013-10-14 NOTE — Progress Notes (Signed)
Patient ID: [name], male   DOB: 06/21/1946, 67 y.o.   MRN: [id number]    SUBJECTIVE: No complaints today.  Had HD yesterday.  ICD interrogated, has had runs of atrial fibrillation.   Marland Kitchen ALPRAZolam  0.25 mg Oral BID  . aspirin  81 mg Oral Daily  . atorvastatin  80 mg Oral q1800  . bisacodyl  10 mg Oral Daily  . calcitRIOL  0.5 mcg Oral Daily  . carvedilol  25 mg Oral BID WC  . darbepoetin (ARANESP) injection - DIALYSIS  60 mcg Intravenous Q Wed-HD  . docusate sodium  100 mg Oral Daily  . gabapentin  300 mg Oral QHS  . heparin subcutaneous  5,000 Units Subcutaneous Q8H  . insulin aspart  0-15 Units Subcutaneous TID WC  . insulin aspart  0-5 Units Subcutaneous QHS  . mycophenolate  500 mg Oral BID  . pantoprazole  40 mg Oral Daily  . predniSONE  5 mg Oral BID WC  . sodium chloride  3 mL Intravenous Q12H      Filed Vitals:   10/14/13 0014 10/14/13 0330 10/14/13 0500 10/14/13 0805  BP: 128/56 120/56  140/58  Pulse: 72 70  114  Temp: 98.9 F (37.2 C) 98 F (36.7 C)  98.8 F (37.1 C)  TempSrc: Oral Oral  Oral  Resp: 32 21  17  Height:      Weight:   186 lb 11.7 oz (84.7 kg)   SpO2: 98% 96%  97%    Intake/Output Summary (Last 24 hours) at 10/14/13 1051 Last data filed at 10/14/13 0557  Gross per 24 hour  Intake    120 ml  Output   2554 ml  Net  -2434 ml    LABS: Basic Metabolic Panel:  Recent Labs  40/98/11 0600 10/12/13 1523 10/13/13 0700 10/14/13 0930  NA 132* 132* 139 135  K 4.0 4.0 3.0* 3.6  CL 100 99 107 100  CO2 19 19 20 26   GLUCOSE 128* 171* 96 198*  BUN 91* 86* 47* 28*  CREATININE 2.87* 2.89* 1.88* 1.64*  CALCIUM 8.1* 8.0* 6.9* 7.5*  MG 1.7  --  1.4*  --   PHOS 4.0 3.8  --   --    Liver Function Tests:  Recent Labs  10/12/13 1523  ALBUMIN 2.0*   No results found for this basename: LIPASE, AMYLASE,  in the last 72 hours CBC:  Recent Labs  10/12/13 0600 10/12/13 1523  WBC 8.0 8.3  HGB 7.5* 7.2*  HCT 22.8* 21.7*  MCV 91.2 91.9    PLT 109* 95*   Cardiac Enzymes: No results found for this basename: CKTOTAL, CKMB, CKMBINDEX, TROPONINI,  in the last 72 hours BNP: No components found with this basename: POCBNP,  D-Dimer: No results found for this basename: DDIMER,  in the last 72 hours Hemoglobin A1C: No results found for this basename: HGBA1C,  in the last 72 hours Fasting Lipid Panel: No results found for this basename: CHOL, HDL, LDLCALC, TRIG, CHOLHDL, LDLDIRECT,  in the last 72 hours Thyroid Function Tests: No results found for this basename: TSH, T4TOTAL, FREET3, T3FREE, THYROIDAB,  in the last 72 hours Anemia Panel: No results found for this basename: VITAMINB12, FOLATE, FERRITIN, TIBC, IRON, RETICCTPCT,  in the last 72 hours  RADIOLOGY: Dg Chest 2 View  10/07/2013   CLINICAL DATA:  Altered mental status  EXAM: CHEST  2 VIEW  COMPARISON:  04/11/2013  FINDINGS: Severe cardiac enlargement. This is stable. Pacer is  unchanged. Lungs are clear.  IMPRESSION: No active cardiopulmonary disease. Stable severe cardiac enlargement.   Electronically Signed   By: Esperanza Heir M.D.   On: 10/07/2013 21:24   Ir Fluoro Guide Cv Line Left  10/10/2013   CLINICAL DATA:  Acute renal failure, heart failure, diabetes  EXAM: Ultrasound guidance for vascular access  Temporary left IJ TRIALYSIS catheter  Date:  10/10/2013 9:32 AM11/26/2014 9:32 AM  Radiologist:  Judie Petit. Ruel Favors, MD  Guidance:  ULTRASOUND AND FLUOROSCOPIC  MEDICATIONS AND MEDICAL HISTORY: 2 mg Versed, 100 mcg fentanyl  ANESTHESIA/SEDATION: 15 min  CONTRAST:  None.  FLUOROSCOPY TIME:  1 MIN  PROCEDURE: Informed consent was obtained from the patient following explanation of the procedure, risks, benefits and alternatives. The patient understands, agrees and consents for the procedure. All questions were addressed. A time out was performed.  Maximal barrier sterile technique utilized including caps, mask, sterile gowns, sterile gloves, large sterile drape, hand hygiene, and  ChloraPrep  Under sterile conditions and local anesthesia, left IJ micropuncture venous access was performed with ultrasound. Images obtained for documentation. Guidewire advanced centrally along the pacer wires. Transitional dilator inserted. Guidewire exchanged for an Amplatz guidewire. Dilatation performed to insert a 24 cm temporaryTrialysis catheter. Tips at the [facility] RA junction. Images obtained for documentation. Blood aspirated easily from all 3 lumens followed by saline and heparin flushes. External caps applied. Catheter secured with Prolene sutures. Sterile dressing applied. No immediate complication. Patient tolerated the procedure well.  COMPLICATIONS: No immediate  IMPRESSION: Successful ultrasound and fluoroscopic left IJ temporary Trialysis catheter. Tips SVC RA junction. Ready for use   Electronically Signed   By: Ruel Favors M.D.   On: 10/10/2013 09:44   Ir US Guide Vasc Access Left  10/10/2013   CLINICAL DATA:  Acute renal failure, heart failure, diabetes  EXAM: Ultrasound guidance for vascular access  Temporary left IJ TRIALYSIS catheter  Date:  10/10/2013 9:32 AM11/26/2014 9:32 AM  Radiologist:  Judie Petit. Ruel Favors, MD  Guidance:  ULTRASOUND AND FLUOROSCOPIC  MEDICATIONS AND MEDICAL HISTORY: 2 mg Versed, 100 mcg fentanyl  ANESTHESIA/SEDATION: 15 min  CONTRAST:  None.  FLUOROSCOPY TIME:  1 MIN  PROCEDURE: Informed consent was obtained from the patient following explanation of the procedure, risks, benefits and alternatives. The patient understands, agrees and consents for the procedure. All questions were addressed. A time out was performed.  Maximal barrier sterile technique utilized including caps, mask, sterile gowns, sterile gloves, large sterile drape, hand hygiene, and ChloraPrep  Under sterile conditions and local anesthesia, left IJ micropuncture venous access was performed with ultrasound. Images obtained for documentation. Guidewire advanced centrally along the pacer wires. Transitional  dilator inserted. Guidewire exchanged for an Amplatz guidewire. Dilatation performed to insert a 24 cm temporaryTrialysis catheter. Tips at the [facility] RA junction. Images obtained for documentation. Blood aspirated easily from all 3 lumens followed by saline and heparin flushes. External caps applied. Catheter secured with Prolene sutures. Sterile dressing applied. No immediate complication. Patient tolerated the procedure well.  COMPLICATIONS: No immediate  IMPRESSION: Successful ultrasound and fluoroscopic left IJ temporary Trialysis catheter. Tips SVC RA junction. Ready for use   Electronically Signed   By: Ruel Favors M.D.   On: 10/10/2013 09:44   Dg Chest Port 1 View  10/13/2013   CLINICAL DATA:  Ventricular tachycardia.  EXAM: PORTABLE CHEST - 1 VIEW  COMPARISON:  10/12/2013.  FINDINGS: Right IJ line, dialysis catheter, cardiac pacer in stable position. Cardiomegaly. No pulmonary venous congestion. Persistent unchanged  bibasilar atelectasis. No pneumothorax.  IMPRESSION: 1. Stable support lines.  Chest is unchanged from 10/12/2013. 2. Persistent bibasilar subsegmental atelectasis.   Electronically Signed   By: Maisie Fus  Register   On: 10/13/2013 07:56   Dg Chest Port 1 View  10/12/2013   CLINICAL DATA:  Pericardial effusion  EXAM: PORTABLE CHEST - 1 VIEW  COMPARISON:  10/10/2013  FINDINGS: Cardiac shadow remains enlarged. Bibasilar atelectatic changes are again noted. A right-sided central venous line is again identified. A pacing device and left-sided dialysis catheter are noted. The Trialysis Catheter tip is noted at the cavoatrial junction. No pneumothorax is seen. No other focal abnormality is noted.  IMPRESSION: New temporary dialysis catheter.  The remainder of the exam is stable.   Electronically Signed   By: Alcide Clever M.D.   On: 10/12/2013 07:49   Dg Chest Port 1 View  10/10/2013   CLINICAL DATA:  Subxiphoid pericardial window procedure.  EXAM: PORTABLE CHEST - 1 VIEW  COMPARISON:   10/08/2013  FINDINGS: Right jugular central line in the lower SVC region. Again noted is enlargement of the cardiac silhouette. There is a drain overlying the inferior aspect of the cardiac silhouette. Stable position of the cardiac leads. Persistent densities at the lung bases could represent pleural effusions and atelectasis. No evidence for large pneumothorax or significant mediastinal air.  IMPRESSION: Persistent basilar chest densities that may represent a combination of atelectasis and pleural fluid.  Stable enlargement of the cardiac silhouette.   Electronically Signed   By: Richarda Overlie M.D.   On: 10/10/2013 08:14   Dg Chest Portable 1 View  10/08/2013   CLINICAL DATA:  Postop for central line placement. Renal transplant.  EXAM: PORTABLE CHEST - 1 VIEW  COMPARISON:  10/07/2013  FINDINGS: Pacer/AICD device which is unchanged.  Right internal jugular line which terminates at the mid to low SVC.  Midline trachea. Cardiomegaly accentuated by AP portable technique. Small left and possible small right pleural effusions. No pneumothorax. Diminished lung volumes. Development of mild pulmonary venous congestion. Bibasilar left greater than right airspace disease. Similar, given differences in technique.  IMPRESSION: Right IJ central line terminating at mid to low SVC, without pneumothorax.  Worsened aeration, with development of mild pulmonary venous congestion and decreased lung volumes.  Similar small left and possible right pleural effusions with adjacent atelectasis or infection.   Electronically Signed   By: Jeronimo Greaves M.D.   On: 10/08/2013 17:50    PHYSICAL EXAM General: NAD Neck: JVP 12 cm, no thyromegaly or thyroid nodule.  Lungs: Slight crackles at bases. CV: Nondisplaced PMI.  Heart regular S1/S2, no S3/S4, no murmur.  1+ ankle edema.  No carotid bruit.  Normal pedal pulses.  Abdomen: Soft, nontender, no hepatosplenomegaly, no distention.  Neurologic: Alert and oriented x 3.  Psych: Normal  affect. Extremities: No clubbing or cyanosis.   TELEMETRY: Reviewed telemetry pt in paced rhythm  ASSESSMENT AND PLAN: 67 yo with failing renal allograft probably now with ESRD also s/p uremic pericardial effusion with pericardial window.  He has known CAD with ischemic cardiomyopathy.  He has been noted to have atrial fibrillation. 1. Pericardial effusion: Probably uremia-related.  Now s/p pericardial window.  Will get echo to reassess LV/RV function and pericardial fluid.  2. Acute on chronic systolic CHF: EF 64-40%, JVP elevated.  Had HD yesterday.  HD for fluid removal per renal.  He is on Coreg.  3. CAD: Stable.  4. Atrial fibrillation: Paroxysmal.  Noted on device interrogation.  Will start coumadin.  He is anemic but this has been attributed to renal disease, no overt bleeding.  Will repeat CBC in am.   Marca Ancona 10/14/2013 10:55 AM

## 2013-10-14 NOTE — Progress Notes (Signed)
ANTICOAGULATION CONSULT NOTE - Initial Consult  Pharmacy Consult for Coumadin Indication: atrial fibrillation  No Known Allergies  Patient Measurements: Height: 6\' 2"  (188 cm) Weight: 186 lb 11.7 oz (84.7 kg) IBW/kg (Calculated) : 82.2  Vital Signs: Temp: 98.8 F (37.1 C) (11/30 0805) Temp src: Oral (11/30 0805) BP: 140/58 mmHg (11/30 0805) Pulse Rate: 114 (11/30 0805)  Labs:  Recent Labs  10/12/13 0600 10/12/13 1523 10/13/13 0700 10/14/13 0930  HGB 7.5* 7.2*  --   --   HCT 22.8* 21.7*  --   --   PLT 109* 95*  --   --   CREATININE 2.87* 2.89* 1.88* 1.64*    Estimated Creatinine Clearance: 52.2 ml/min (by C-G formula based on Cr of 1.64).   Medical History: Past Medical History  Diagnosis Date  . CAD (coronary artery disease)     a. 03/1990 - s/p MI and PTCA  . Systolic congestive heart failure 01/2012    a. 09/2013 Echo: EF 40-45%, inf/infsept & post HK, sev LVH, mod reduced RV fxn, PASP , large pericardial eff - 39 mm in maximal dimension, no RV collapse, dilated IVC w/o resp variation.  . Hypertension   . Diabetes mellitus   . ICD (implantable cardiac defibrillator) in place     a. BSX 02/2012, in setting of cardiac arrest.  . History of pneumonia 2011  . Arthritis   . History of blood transfusion   . CKD (chronic kidney disease), stage IV     a. 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 Cancer     a. Bladder Cancer- 2004;  b. Skin Cancer- Basal, Squamous, and a few melanoma  . Right foot drop   . Cellulitis   . Pericardial effusion     a. 09/2013 Echo: large pericardial eff - 39 mm in maximal dimension, no RV collapse, dilated IVC w/o resp variation.    Medications:  Prescriptions prior to admission  Medication Sig Dispense Refill  . ALPRAZolam (XANAX) 0.25 MG tablet Take 0.25 mg by mouth 2 (two) times daily.       Marland Kitchen amLODipine (NORVASC) 5 MG tablet Take 5 mg by mouth 2 (two) times daily.      Marland Kitchen aspirin EC 81 MG tablet Take 81 mg  by mouth every evening.       Marland Kitchen atorvastatin (LIPITOR) 80 MG tablet Take 80 mg by mouth daily.      . carvedilol (COREG) 12.5 MG tablet Take 12.5 mg by mouth 2 (two) times daily with a meal.      . ferrous sulfate 325 (65 FE) MG EC tablet Take 1 tablet (325 mg total) by mouth 2 (two) times daily with a meal.  60 tablet  0  . fish oil-omega-3 fatty acids 1000 MG capsule Take 1 g by mouth daily.      . furosemide (LASIX) 40 MG tablet Take 2 tablets (80 mg total) by mouth daily as needed (if weight is above 173 lbs).  60 tablet  1  . gabapentin (NEURONTIN) 300 MG capsule Take 300 mg by mouth 2 (two) times daily.      Marland Kitchen HYDROcodone-acetaminophen (NORCO) 10-325 MG per tablet Take 1 tablet by mouth every 4 (four) hours as needed for moderate pain.       Marland Kitchen insulin aspart (NOVOLOG) 100 UNIT/ML injection Inject 6 Units into the skin 3 (three) times daily before meals.      . Insulin Detemir (LEVEMIR FLEXPEN) 100 UNIT/ML SOPN Inject 40  Units into the skin every morning.       . Multiple Vitamin (MULTIVITAMIN WITH MINERALS) TABS Take 1 tablet by mouth daily.      . mycophenolate (CELLCEPT) 500 MG tablet Take 500 mg by mouth 2 (two) times daily.       . nitroGLYCERIN (NITROSTAT) 0.4 MG SL tablet Place 0.4 mg under the tongue every 5 (five) minutes as needed for chest pain. x3 doses as needed for chest pain      . predniSONE (DELTASONE) 5 MG tablet Take 5 mg by mouth 2 (two) times daily.      . primidone (MYSOLINE) 50 MG tablet Take 50 mg by mouth at bedtime.       . ranitidine (ZANTAC) 300 MG tablet Take 300 mg by mouth at bedtime.       Marland Kitchen tobramycin (TOBREX) 0.3 % ophthalmic solution Place 1 drop into both eyes as needed (for allergies).      . vitamin C (ASCORBIC ACID) 500 MG tablet Take 500 mg by mouth daily.        Assessment: 67 y.o. male to begin coumadin for afib. Baseline INR 11/25 1.37. Pt with Hgb 7.2, plt 95, and h/o anemia. Cardiology aware and will follow closely. No overt bleeding  noted.  Goal of Therapy:  INR 2-3 Monitor platelets by anticoagulation protocol: Yes   Plan:  1. Daily INR 2. Coumadin 5 mg po tonight  Christoper Fabian, PharmD, BCPS Clinical pharmacist, pager [phone] 10/14/2013,2:24 PM

## 2013-10-14 NOTE — Progress Notes (Signed)
[facility] Progress Note [place] TEAM 1 - Stepdown/ICU TEAM   CHRISTIEN FRANKL ZOX:[id number] DOB: 06-14-1946 DOA: 10/07/2013 PCP: [name], MD  Brief narrative: 67 year old male status post failing renal transplant on immunosuppressants. He also has a history of coronary artery disease, systolic CHF, insulin-dependent diabetes mellitus, hypertension s/p pacemaker. The patient came to the hospital for lethargy related to hypoglycemia and was found to have a large pericardial effusion which is mentioned to be chronic suspected to be related to uremia. He did not have tamponade features. He underwent a pericardial window on 11/24..  Due to a failing transplant, he was initiated on dialysis on 11/26.  HPI/Subjective: Pt c/o poorly controlled pain in B legs, related to superficial wounds and neuropathy.  He denies cp, sob, n/v, or abdom pain.    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 -Continue immunosuppressants per Nephrology  Newly diagnosed Afib - care as per Cardiology - noted on interrogation of pacer - warfarin initiated   Chronic pericardial effusion  - s/p pericardial window - chest tube has been removed and patient is stable  Severe seborrhoic dermatitis / Multiple skin cancers - as a result of chronic immunosuppression - follows with dermatology regularly - non-healing ulcer on post right upper arm due to resection of skin cancer - follow  - B LE superficial skin wounds to be assessed by WOC RN on Monday  HTN  - controlled on current medication  Diabetes mellitus type 2, controlled -  CBG controlled on current treatment  Acute on chronic systolic CHF  - 18 pounds overweight on admission when compared with last month - volume status is now being managed by via  dialysis.  Code Status: FULL Family Communication: spoke w/ patient and wife at bedside at length  Disposition Plan: SDU  Consultants: Nephrology TCTS Vascular Surgery Cardiology   Procedures: 11/24 TTE >>> Large pericardial effusion without tamponade, EF 40-45%  11/24 OR >>> Pericardial window  11/26  Left IJ trialysis cath placed by IR  Antibiotics: Zinacef 11/24 >>> 11/25  DVT prophylaxis: SQ Heparin  Objective: Blood pressure 140/58, pulse 114, temperature 98.8 F (37.1 C), temperature source Oral, resp. rate 17, height 6\' 2"  (1.88 m), weight 84.7 kg (186 lb 11.7 oz), SpO2 97.00%.  Intake/Output Summary (Last 24 hours) at 10/14/13 1214 Last data filed at 10/14/13 0557  Gross per 24 hour  Intake    120 ml  Output   2554 ml  Net  -2434 ml   Exam: General: No acute respiratory distress at rest  Lungs: Clear to auscultation bilaterally without wheezes or crackles - blunting of BS in B bases  Cardiovascular: Regular rate and rhythm without murmur gallop or rub normal S1 and S2 Abdomen: Nontender, nondistended, soft, bowel sounds positive, no rebound, no ascites, no appreciable mass Extremities: No significant cyanosis, clubbing, or edema bilateral lower extremities Cutaneous:  Wounds undressed B LE - R anterior leg just superior to ankle w/ superficial skin wound w/o erythema or d/c - L anterior leg just superior to ankle w/ smaller but deeper wound, worrisome for possible venous insuff type wound but w/o erythema or d/c   Data Reviewed: Basic Metabolic Panel:  Recent Labs Lab 10/11/13 0500 10/12/13 0600 10/12/13 1523 10/13/13 0700 10/14/13 0930  NA 135 132* 132* 139 135  K 3.8 4.0  4.0 3.0* 3.6  CL 102 100 99 107 100  CO2 20 19 19 20 26   GLUCOSE 166* 128* 171* 96 198*  BUN 88* 91* 86* 47* 28*  CREATININE 2.84* 2.87* 2.89* 1.88* 1.64*  CALCIUM 8.2* 8.1* 8.0* 6.9* 7.5*  MG  --  1.7  --  1.4*  --   PHOS  --  4.0 3.8  --   --    Liver Function  Tests:  Recent Labs Lab 10/07/13 1902 10/08/13 0420 10/08/13 1200 10/10/13 0415 10/12/13 1523  AST 25 21 16 12   --   ALT 22 23 19 15   --   ALKPHOS 58 58 55 52  --   BILITOT 0.3 0.2* 0.2* 0.2*  --   PROT 4.7* 5.0* 4.7* 4.3*  --   ALBUMIN 2.7* 2.6* 2.4* 2.0* 2.0*   CBC:  Recent Labs Lab 10/07/13 1902 10/08/13 0420 10/08/13 1200 10/09/13 0500 10/10/13 0415 10/12/13 0600 10/12/13 1523  WBC 12.3* 10.8* 8.6 8.2 7.8 8.0 8.3  NEUTROABS 11.3* 9.8*  --   --   --   --   --   HGB 8.0* 7.8* 7.2* 7.2* 7.0* 7.5* 7.2*  HCT 24.1* 24.9* 23.4* 22.0* 21.8* 22.8* 21.7*  MCV 93.1 95.4 94.4 94.4 92.8 91.2 91.9  PLT 104* 124* 103* 93* 95* 109* 95*   Cardiac Enzymes:  Recent Labs Lab 10/07/13 2008 10/08/13 0420 10/08/13 0825 10/08/13 1405  TROPONINI <0.30 <0.30 <0.30 <0.30   BNP (last 3 results)  Recent Labs  04/09/13 1856 10/08/13 0825  PROBNP 21940.0* 10879.0*   CBG:  Recent Labs Lab 10/12/13 2228 10/13/13 0811 10/13/13 1159 10/13/13 2152 10/14/13 0801  GLUCAP 107* 110* 127* 141* 155*    Recent Results (from the past 240 hour(s))  MRSA PCR SCREENING     Status: None   Collection Time    10/08/13  2:45 AM      Result Value Range Status   MRSA by PCR NEGATIVE  NEGATIVE Final   Comment:            The GeneXpert MRSA Assay (FDA     approved for NASAL specimens     only), is one component of a     comprehensive MRSA colonization     surveillance program. It is not     intended to diagnose MRSA     infection nor to guide or     monitor treatment for     MRSA infections.  AFB CULTURE WITH SMEAR     Status: None   Collection Time    10/08/13  2:50 PM      Result Value Range Status   Specimen Description FLUID PERICARDIAL   Final   Special Requests NONE   Final   ACID FAST SMEAR     Final   Value: NO ACID FAST BACILLI SEEN     Performed at [facility]   Culture     Final   Value: CULTURE WILL BE EXAMINED FOR 6 WEEKS BEFORE ISSUING A FINAL REPORT      Performed at [facility]   Report Status PENDING   Incomplete  BODY FLUID CULTURE     Status: None   Collection Time    10/08/13  2:50 PM      Result Value Range Status   Specimen Description FLUID PERICARDIAL   Final   Special Requests NONE   Final   Gram Stain     Final   Value: RARE  WBC PRESENT,BOTH PMN AND MONONUCLEAR     NO ORGANISMS SEEN     Performed at [facility]     Performed at [facility]   Culture     Final   Value: NO GROWTH 3 DAYS     Performed at [facility]   Report Status 10/11/2013 FINAL   Final  GRAM STAIN     Status: None   Collection Time    10/08/13  2:50 PM      Result Value Range Status   Specimen Description FLUID PERICARDIAL   Final   Special Requests NONE   Final   Gram Stain     Final   Value: RARE WBC PRESENT,BOTH PMN AND MONONUCLEAR     NO ORGANISMS SEEN   Report Status 10/08/2013 FINAL   Final     Studies:  Recent x-ray studies have been reviewed in detail by the Attending Physician  Scheduled Meds:  Scheduled Meds: . ALPRAZolam  0.25 mg Oral BID  . aspirin  81 mg Oral Daily  . atorvastatin  80 mg Oral q1800  . bisacodyl  10 mg Oral Daily  . calcitRIOL  0.5 mcg Oral Daily  . carvedilol  25 mg Oral BID WC  . darbepoetin (ARANESP) injection - DIALYSIS  60 mcg Intravenous Q Wed-HD  . docusate sodium  100 mg Oral Daily  . gabapentin  300 mg Oral QHS  . heparin subcutaneous  5,000 Units Subcutaneous Q8H  . insulin aspart  0-15 Units Subcutaneous TID WC  . insulin aspart  0-5 Units Subcutaneous QHS  . mycophenolate  500 mg Oral BID  . pantoprazole  40 mg Oral Daily  . predniSONE  5 mg Oral BID WC  . sodium chloride  3 mL Intravenous Q12H    Time spent on care of this patient: 35 min   Roshunda Keir T, MD  [facility] Office  [phone] Pager - Text Page per Loretha Stapler as per below:  On-Call/Text Page:      [email or web address]      password [email or web address]  If 7PM-7AM, please contact  night-coverage [email or web address] Password [email or web address] 10/14/2013, 12:14 PM   LOS: 7 days

## 2013-10-15 ENCOUNTER — Encounter (HOSPITAL_COMMUNITY): Payer: Self-pay | Admitting: Radiology

## 2013-10-15 ENCOUNTER — Encounter: Payer: Self-pay | Admitting: Physical Medicine & Rehabilitation

## 2013-10-15 DIAGNOSIS — R5381 Other malaise: Secondary | ICD-10-CM

## 2013-10-15 DIAGNOSIS — N179 Acute kidney failure, unspecified: Secondary | ICD-10-CM

## 2013-10-15 DIAGNOSIS — Z7901 Long term (current) use of anticoagulants: Secondary | ICD-10-CM

## 2013-10-15 DIAGNOSIS — G92 Toxic encephalopathy: Secondary | ICD-10-CM

## 2013-10-15 LAB — RENAL FUNCTION PANEL
Calcium: 7.9 mg/dL — ABNORMAL LOW (ref 8.4–10.5)
GFR calc Af Amer: 37 mL/min — ABNORMAL LOW (ref >90)
Glucose, Bld: 168 mg/dL — ABNORMAL HIGH (ref 70–99)
Phosphorus: 3.2 mg/dL (ref 2.3–4.6)
Potassium: 3.9 mEq/L (ref 3.5–5.1)
Sodium: 135 mEq/L (ref 135–145)

## 2013-10-15 LAB — PROTIME-INR
INR: 1.24 (ref 0.00–1.49)
Prothrombin Time: 15.3 seconds — ABNORMAL HIGH (ref 11.6–15.2)

## 2013-10-15 LAB — CBC
Platelets: 126 10*3/uL — ABNORMAL LOW (ref 150–400)
RBC: 2.42 MIL/uL — ABNORMAL LOW (ref 4.22–5.81)
WBC: 9.3 10*3/uL (ref 4.0–10.5)

## 2013-10-15 LAB — GLUCOSE, CAPILLARY: Glucose-Capillary: 139 mg/dL — ABNORMAL HIGH (ref 70–99)

## 2013-10-15 MED ORDER — WARFARIN SODIUM 7.5 MG PO TABS
7.5000 mg | ORAL_TABLET | Freq: Once | ORAL | Status: DC
  Filled 2013-10-15: qty 1

## 2013-10-15 MED ORDER — COLLAGENASE 250 UNIT/GM EX OINT
TOPICAL_OINTMENT | Freq: Every day | CUTANEOUS | Status: DC
  Administered 2013-10-15 – 2013-10-17 (×2): via TOPICAL
  Filled 2013-10-15 (×2): qty 30

## 2013-10-15 MED ORDER — CEFAZOLIN SODIUM-DEXTROSE 2-3 GM-% IV SOLR
2.0000 g | Freq: Once | INTRAVENOUS | Status: DC
  Filled 2013-10-15: qty 50

## 2013-10-15 MED ORDER — CEFAZOLIN SODIUM-DEXTROSE 2-3 GM-% IV SOLR
2.0000 g | Freq: Once | INTRAVENOUS | Status: AC
  Administered 2013-10-15: 2 g via INTRAVENOUS
  Filled 2013-10-15: qty 50

## 2013-10-15 MED ORDER — DARBEPOETIN ALFA-POLYSORBATE 60 MCG/0.3ML IJ SOLN
60.0000 ug | INTRAMUSCULAR | Status: DC
  Administered 2013-10-16: 60 ug via INTRAVENOUS
  Filled 2013-10-15: qty 0.3

## 2013-10-15 MED ORDER — HEPARIN SODIUM (PORCINE) 5000 UNIT/ML IJ SOLN
5000.0000 [IU] | Freq: Three times a day (TID) | INTRAMUSCULAR | Status: AC
  Administered 2013-10-15: 5000 [IU] via SUBCUTANEOUS
  Filled 2013-10-15 (×3): qty 1

## 2013-10-15 MED ORDER — HEPARIN SODIUM (PORCINE) 5000 UNIT/ML IJ SOLN
5000.0000 [IU] | Freq: Three times a day (TID) | INTRAMUSCULAR | Status: DC
  Filled 2013-10-15 (×3): qty 1

## 2013-10-15 MED ORDER — DOXERCALCIFEROL 4 MCG/2ML IV SOLN
2.0000 ug | INTRAVENOUS | Status: DC
  Administered 2013-10-16: 2 ug via INTRAVENOUS
  Filled 2013-10-15: qty 2

## 2013-10-15 NOTE — Progress Notes (Signed)
[facility] Progress Note [place] TEAM 1 - Stepdown/ICU TEAM   [name] ZOX:[id number] DOB: [birth year] DOA: 10/07/2013 PCP: [name], MD  Brief narrative: 67 year old male status post failing renal transplant on immunosuppressants. He also has a history of coronary artery disease, systolic CHF, insulin-dependent diabetes mellitus, hypertension s/p pacemaker. The patient came to the hospital for lethargy felt to be related to hypoglycemia and was found to have a large pericardial effusion suspected to be related to uremia. He did not have tamponade features. He underwent a pericardial window on 11/24.  Due to a failing transplant, he was initiated on dialysis on 11/26.  HPI/Subjective: Pt endorsed improved sleep overnight after addition of Ambien.  He denies chest pain fevers chills nausea vomiting or abdominal pain.  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 -Continue immunosuppressants per Nephrology  Newly diagnosed Afib - care as per Cardiology - noted on interrogation of pacer - warfarin initiated   Chronic pericardial effusion with acute symptoms - s/p pericardial window - chest tube has been removed and patient is stable  Severe seborrhoic dermatitis / Multiple skin cancers - as a result of chronic immunosuppression - follows with dermatology regularly - non-healing ulcer on post right upper arm due to resection of skin cancer - follow  - B LE wounds per WOC RN   HTN  - controlled on current medication  Diabetes mellitus type 2, controlled -  CBG reasonably controlled on current treatment  Acute on chronic systolic CHF  - 18  pounds over weight on admission when compared with last month - volume status is now being managed by via dialysis  Deconditioning -CIR eval in progress  Code Status: FULL Family Communication: spoke w/ patient - wife not present at time of exam today Disposition Plan: Transfer to Floor - await tunneled hemodialysis catheter - continue warfarin load - await word on inpatient rehabilitation candidacy  Consultants: Nephrology TCTS Vascular Surgery Cardiology   Procedures: 11/24 TTE >>> Large pericardial effusion without tamponade, EF 40-45%  11/24 OR >>> Pericardial window  11/26  Left IJ trialysis cath placed by IR  Antibiotics: Zinacef 11/24 >>> 11/25  DVT prophylaxis: SQ Heparin >> warfarin  Objective: Blood pressure 114/64, pulse 70, temperature 98.3 F (36.8 C), temperature source Oral, resp. rate 25, height 6\' 2"  (1.88 m), weight 186 lb 1.1 oz (84.4 kg), SpO2 99.00%.  Intake/Output Summary (Last 24 hours) at 10/15/13 1155 Last data filed at 10/15/13 0830  Gross per 24 hour  Intake     80 ml  Output    200 ml  Net   -120 ml   Exam: General: No acute respiratory distress at rest  Lungs: Clear to auscultation bilaterally without wheezes or crackles -bibasilar diminished sounds Cardiovascular: Regular rate and rhythm without murmur gallop or rub normal S1 and S2 Abdomen: Nontender, nondistended, soft, bowel sounds positive, no rebound, no ascites, no appreciable mass Extremities: No significant cyanosis, clubbing, or edema bilateral lower extremities Cutaneous:  Bilateral lower extremity wounds now dressed dry and clean  Data Reviewed: Basic Metabolic Panel:  Recent Labs Lab 10/12/13 0600 10/12/13 1523 10/13/13 0700 10/14/13 0930 10/15/13 0600  NA 132* 132* 139 135 135  K 4.0 4.0 3.0* 3.6  3.9  CL 100 99 107 100 101  CO2 19 19 20 26 26   GLUCOSE 128* 171* 96 198* 168*  BUN 91* 86* 47* 28* 33*  CREATININE 2.87* 2.89* 1.88* 1.64* 2.06*  CALCIUM 8.1* 8.0*  6.9* 7.5* 7.9*  MG 1.7  --  1.4*  --   --   PHOS 4.0 3.8  --   --  3.2   Liver Function Tests:  Recent Labs Lab 10/08/13 1200 10/10/13 0415 10/12/13 1523 10/15/13 0600  AST 16 12  --   --   ALT 19 15  --   --   ALKPHOS 55 52  --   --   BILITOT 0.2* 0.2*  --   --   PROT 4.7* 4.3*  --   --   ALBUMIN 2.4* 2.0* 2.0* 2.2*   CBC:  Recent Labs Lab 10/09/13 0500 10/10/13 0415 10/12/13 0600 10/12/13 1523 10/15/13 0600  WBC 8.2 7.8 8.0 8.3 9.3  HGB 7.2* 7.0* 7.5* 7.2* 7.2*  HCT 22.0* 21.8* 22.8* 21.7* 22.9*  MCV 94.4 92.8 91.2 91.9 94.6  PLT 93* 95* 109* 95* 126*   Cardiac Enzymes:  Recent Labs Lab 10/08/13 1405  TROPONINI <0.30   BNP (last 3 results)  Recent Labs  04/09/13 1856 10/08/13 0825  PROBNP 21940.0* 10879.0*   CBG:  Recent Labs Lab 10/14/13 0801 10/14/13 1213 10/14/13 1748 10/14/13 2120 10/15/13 0816  GLUCAP 155* 202* 145* 152* 162*    Recent Results (from the past 240 hour(s))  MRSA PCR SCREENING     Status: None   Collection Time    10/08/13  2:45 AM      Result Value Range Status   MRSA by PCR NEGATIVE  NEGATIVE Final   Comment:            The GeneXpert MRSA Assay (FDA     approved for NASAL specimens     only), is one component of a     comprehensive MRSA colonization     surveillance program. It is not     intended to diagnose MRSA     infection nor to guide or     monitor treatment for     MRSA infections.  AFB CULTURE WITH SMEAR     Status: None   Collection Time    10/08/13  2:50 PM      Result Value Range Status   Specimen Description FLUID PERICARDIAL   Final   Special Requests NONE   Final   ACID FAST SMEAR     Final   Value: NO ACID FAST BACILLI SEEN     Performed at [facility]   Culture     Final   Value: CULTURE WILL BE EXAMINED FOR 6 WEEKS BEFORE ISSUING A FINAL REPORT     Performed at [facility]   Report Status PENDING   Incomplete  BODY FLUID CULTURE     Status: None   Collection Time     10/08/13  2:50 PM      Result Value Range Status   Specimen Description FLUID PERICARDIAL   Final   Special Requests NONE   Final   Gram Stain     Final   Value: RARE WBC PRESENT,BOTH PMN AND MONONUCLEAR     NO ORGANISMS SEEN     Performed at [facility]     Performed at [facility]   Culture     Final   Value: NO GROWTH 3  DAYS     Performed at [facility]   Report Status 10/11/2013 FINAL   Final  GRAM STAIN     Status: None   Collection Time    10/08/13  2:50 PM      Result Value Range Status   Specimen Description FLUID PERICARDIAL   Final   Special Requests NONE   Final   Gram Stain     Final   Value: RARE WBC PRESENT,BOTH PMN AND MONONUCLEAR     NO ORGANISMS SEEN   Report Status 10/08/2013 FINAL   Final     Studies:  Recent x-ray studies have been reviewed in detail by the Attending Physician  Scheduled Meds:  Scheduled Meds: . ALPRAZolam  0.25 mg Oral BID  . aspirin  81 mg Oral Daily  . atorvastatin  80 mg Oral q1800  . bisacodyl  10 mg Oral Daily  . carvedilol  25 mg Oral BID WC  .  ceFAZolin (ANCEF) IV  2 g Intravenous Once  . collagenase   Topical Daily  . [START ON 10/16/2013] darbepoetin (ARANESP) injection - DIALYSIS  60 mcg Intravenous Q Tue-HD  . docusate sodium  100 mg Oral Daily  . [START ON 10/16/2013] doxercalciferol  2 mcg Intravenous Q T,Th,Sa-HD  . gabapentin  300 mg Oral QHS  . heparin subcutaneous  5,000 Units Subcutaneous Q8H  . insulin aspart  0-15 Units Subcutaneous TID WC  . insulin aspart  0-5 Units Subcutaneous QHS  . mycophenolate  500 mg Oral BID  . pantoprazole  40 mg Oral Daily  . predniSONE  5 mg Oral BID WC  . sodium chloride  3 mL Intravenous Q12H  . warfarin  7.5 mg Oral ONCE-1800  . Warfarin - Pharmacist Dosing Inpatient   Does not apply q1800    Time spent on care of this patient: 35 min   ELLIS,ALLISON L., ANP  [facility] Office  [phone] Pager - Text Page per Loretha Stapler as per  below:  On-Call/Text Page:      [email or web address]      password [email or web address]  If 7PM-7AM, please contact night-coverage [email or web address] Password [email or web address] 10/15/2013, 11:55 AM   LOS: 8 days   I have personally examined this patient and reviewed the entire database. I have reviewed the above note, made any necessary editorial changes, and agree with its content.  Lonia Blood, MD [facility]

## 2013-10-15 NOTE — Progress Notes (Signed)
Patient ID: [name], male   DOB: 1946/08/09, 67 y.o.   MRN: [id number]    SUBJECTIVE: Coumadin has been started for his atrial fibrillation. Hemoglobin remains low at 7.2. Post hospital the patient will follow with Dr. Lady Gary of [place] in [place].   Filed Vitals:   10/14/13 1212 10/14/13 1939 10/15/13 0022 10/15/13 0351  BP: 135/53 153/64 117/63 123/65  Pulse: 70 115 70 70  Temp:  98.9 F (37.2 C) 98.1 F (36.7 C) 98.8 F (37.1 C)  TempSrc: Oral Oral Axillary Axillary  Resp: 27 21 16 18   Height:      Weight:    186 lb 1.1 oz (84.4 kg)  SpO2: 99% 94% 97% 95%     Intake/Output Summary (Last 24 hours) at 10/15/13 0809 Last data filed at 10/15/13 0600  Gross per 24 hour  Intake     60 ml  Output    200 ml  Net   -140 ml    LABS: Basic Metabolic Panel:  Recent Labs  40/98/11 1523 10/13/13 0700 10/14/13 0930 10/15/13 0600  NA 132* 139 135 135  K 4.0 3.0* 3.6 3.9  CL 99 107 100 101  CO2 19 20 26 26   GLUCOSE 171* 96 198* 168*  BUN 86* 47* 28* 33*  CREATININE 2.89* 1.88* 1.64* 2.06*  CALCIUM 8.0* 6.9* 7.5* 7.9*  MG  --  1.4*  --   --   PHOS 3.8  --   --  3.2   Liver Function Tests:  Recent Labs  10/12/13 1523 10/15/13 0600  ALBUMIN 2.0* 2.2*   No results found for this basename: LIPASE, AMYLASE,  in the last 72 hours CBC:  Recent Labs  10/12/13 1523 10/15/13 0600  WBC 8.3 9.3  HGB 7.2* 7.2*  HCT 21.7* 22.9*  MCV 91.9 94.6  PLT 95* 126*   Cardiac Enzymes: No results found for this basename: CKTOTAL, CKMB, CKMBINDEX, TROPONINI,  in the last 72 hours BNP: No components found with this basename: POCBNP,  D-Dimer: No results found for this basename: DDIMER,  in the last 72 hours Hemoglobin A1C: No results found for this basename: HGBA1C,  in the last 72 hours Fasting Lipid Panel: No results found for this basename: CHOL, HDL, LDLCALC, TRIG, CHOLHDL, LDLDIRECT,  in the last 72 hours Thyroid Function Tests:  Recent Labs  10/14/13 0600  TSH  1.385    RADIOLOGY: Dg Chest 2 View  10/07/2013   CLINICAL DATA:  Altered mental status  EXAM: CHEST  2 VIEW  COMPARISON:  04/11/2013  FINDINGS: Severe cardiac enlargement. This is stable. Pacer is unchanged. Lungs are clear.  IMPRESSION: No active cardiopulmonary disease. Stable severe cardiac enlargement.   Electronically Signed   By: Esperanza Heir M.D.   On: 10/07/2013 21:24   Ir Fluoro Guide Cv Line Left  10/10/2013   CLINICAL DATA:  Acute renal failure, heart failure, diabetes  EXAM: Ultrasound guidance for vascular access  Temporary left IJ TRIALYSIS catheter  Date:  10/10/2013 9:32 AM11/26/2014 9:32 AM  Radiologist:  Judie Petit. Ruel Favors, MD  Guidance:  ULTRASOUND AND FLUOROSCOPIC  MEDICATIONS AND MEDICAL HISTORY: 2 mg Versed, 100 mcg fentanyl  ANESTHESIA/SEDATION: 15 min  CONTRAST:  None.  FLUOROSCOPY TIME:  1 MIN  PROCEDURE: Informed consent was obtained from the patient following explanation of the procedure, risks, benefits and alternatives. The patient understands, agrees and consents for the procedure. All questions were addressed. A time out was performed.  Maximal barrier sterile technique utilized including  caps, mask, sterile gowns, sterile gloves, large sterile drape, hand hygiene, and ChloraPrep  Under sterile conditions and local anesthesia, left IJ micropuncture venous access was performed with ultrasound. Images obtained for documentation. Guidewire advanced centrally along the pacer wires. Transitional dilator inserted. Guidewire exchanged for an Amplatz guidewire. Dilatation performed to insert a 24 cm temporaryTrialysis catheter. Tips at the [facility] RA junction. Images obtained for documentation. Blood aspirated easily from all 3 lumens followed by saline and heparin flushes. External caps applied. Catheter secured with Prolene sutures. Sterile dressing applied. No immediate complication. Patient tolerated the procedure well.  COMPLICATIONS: No immediate  IMPRESSION: Successful ultrasound  and fluoroscopic left IJ temporary Trialysis catheter. Tips SVC RA junction. Ready for use   Electronically Signed   By: Ruel Favors M.D.   On: 10/10/2013 09:44   Ir US Guide Vasc Access Left  10/10/2013   CLINICAL DATA:  Acute renal failure, heart failure, diabetes  EXAM: Ultrasound guidance for vascular access  Temporary left IJ TRIALYSIS catheter  Date:  10/10/2013 9:32 AM11/26/2014 9:32 AM  Radiologist:  Judie Petit. Ruel Favors, MD  Guidance:  ULTRASOUND AND FLUOROSCOPIC  MEDICATIONS AND MEDICAL HISTORY: 2 mg Versed, 100 mcg fentanyl  ANESTHESIA/SEDATION: 15 min  CONTRAST:  None.  FLUOROSCOPY TIME:  1 MIN  PROCEDURE: Informed consent was obtained from the patient following explanation of the procedure, risks, benefits and alternatives. The patient understands, agrees and consents for the procedure. All questions were addressed. A time out was performed.  Maximal barrier sterile technique utilized including caps, mask, sterile gowns, sterile gloves, large sterile drape, hand hygiene, and ChloraPrep  Under sterile conditions and local anesthesia, left IJ micropuncture venous access was performed with ultrasound. Images obtained for documentation. Guidewire advanced centrally along the pacer wires. Transitional dilator inserted. Guidewire exchanged for an Amplatz guidewire. Dilatation performed to insert a 24 cm temporaryTrialysis catheter. Tips at the [facility] RA junction. Images obtained for documentation. Blood aspirated easily from all 3 lumens followed by saline and heparin flushes. External caps applied. Catheter secured with Prolene sutures. Sterile dressing applied. No immediate complication. Patient tolerated the procedure well.  COMPLICATIONS: No immediate  IMPRESSION: Successful ultrasound and fluoroscopic left IJ temporary Trialysis catheter. Tips SVC RA junction. Ready for use   Electronically Signed   By: Ruel Favors M.D.   On: 10/10/2013 09:44   Dg Chest Port 1 View  10/13/2013   CLINICAL DATA:   Ventricular tachycardia.  EXAM: PORTABLE CHEST - 1 VIEW  COMPARISON:  10/12/2013.  FINDINGS: Right IJ line, dialysis catheter, cardiac pacer in stable position. Cardiomegaly. No pulmonary venous congestion. Persistent unchanged bibasilar atelectasis. No pneumothorax.  IMPRESSION: 1. Stable support lines.  Chest is unchanged from 10/12/2013. 2. Persistent bibasilar subsegmental atelectasis.   Electronically Signed   By: Maisie Fus  Register   On: 10/13/2013 07:56   Dg Chest Port 1 View  10/12/2013   CLINICAL DATA:  Pericardial effusion  EXAM: PORTABLE CHEST - 1 VIEW  COMPARISON:  10/10/2013  FINDINGS: Cardiac shadow remains enlarged. Bibasilar atelectatic changes are again noted. A right-sided central venous line is again identified. A pacing device and left-sided dialysis catheter are noted. The Trialysis Catheter tip is noted at the cavoatrial junction. No pneumothorax is seen. No other focal abnormality is noted.  IMPRESSION: New temporary dialysis catheter.  The remainder of the exam is stable.   Electronically Signed   By: Alcide Clever M.D.   On: 10/12/2013 07:49   Dg Chest Port 1 View  10/10/2013  CLINICAL DATA:  Subxiphoid pericardial window procedure.  EXAM: PORTABLE CHEST - 1 VIEW  COMPARISON:  10/08/2013  FINDINGS: Right jugular central line in the lower SVC region. Again noted is enlargement of the cardiac silhouette. There is a drain overlying the inferior aspect of the cardiac silhouette. Stable position of the cardiac leads. Persistent densities at the lung bases could represent pleural effusions and atelectasis. No evidence for large pneumothorax or significant mediastinal air.  IMPRESSION: Persistent basilar chest densities that may represent a combination of atelectasis and pleural fluid.  Stable enlargement of the cardiac silhouette.   Electronically Signed   By: Richarda Overlie M.D.   On: 10/10/2013 08:14   Dg Chest Portable 1 View  10/08/2013   CLINICAL DATA:  Postop for central line  placement. Renal transplant.  EXAM: PORTABLE CHEST - 1 VIEW  COMPARISON:  10/07/2013  FINDINGS: Pacer/AICD device which is unchanged.  Right internal jugular line which terminates at the mid to low SVC.  Midline trachea. Cardiomegaly accentuated by AP portable technique. Small left and possible small right pleural effusions. No pneumothorax. Diminished lung volumes. Development of mild pulmonary venous congestion. Bibasilar left greater than right airspace disease. Similar, given differences in technique.  IMPRESSION: Right IJ central line terminating at mid to low SVC, without pneumothorax.  Worsened aeration, with development of mild pulmonary venous congestion and decreased lung volumes.  Similar small left and possible right pleural effusions with adjacent atelectasis or infection.   Electronically Signed   By: Jeronimo Greaves M.D.   On: 10/08/2013 17:50   TELEMETRY: I reviewed telemetry today October 15, 2013. The rhythm is paced. There are some bursts of increased heart rate with atrial fibrillation.   ASSESSMENT AND PLAN:    Acute on chronic systolic CHF (congestive heart failure)    The patient's volume is now being controlled with dialysis. He is stable.    CAD (coronary artery disease)      Coronary disease is stable.   End-stage renal disease    He will be dialyzed going forward in the future.     Pericardial effusion      This is been treated with pericardial window this admission    Anemia in chronic kidney disease     Hemoglobin remained 7.2    Cardiomyopathy, ischemic      The patient is on carvedilol.    Atrial fibrillation     Patient is anticoagulated. He is on carvedilol for rate control when he has bursts of atrial fibrillation.    Warfarin anticoagulation     Coumadin is now being used for his atrial fibrillation.   Willa Rough 10/15/2013 8:09 AM

## 2013-10-15 NOTE — Progress Notes (Signed)
ANTICOAGULATION CONSULT NOTE   Pharmacy Consult for Coumadin Indication: atrial fibrillation  No Known Allergies  Labs:  Recent Labs  10/12/13 1523 10/13/13 0700 10/14/13 0930 10/15/13 0600  HGB 7.2*  --   --  7.2*  HCT 21.7*  --   --  22.9*  PLT 95*  --   --  126*  LABPROT  --   --   --  15.3*  INR  --   --   --  1.24  CREATININE 2.89* 1.88* 1.64* 2.06*    Estimated Creatinine Clearance: 41.6 ml/min (by C-G formula based on Cr of 2.06).  Assessment: 67 y.o. male to begin coumadin for afib. Baseline INR 11/25 1.37. Pt with Hgb 7.2, plt 95, and h/o anemia.   Goal of Therapy:  INR 2-3 Monitor platelets by anticoagulation protocol: Yes   Plan:  1. Daily INR 2. Coumadin 7.5 mg po tonight  Thank you Okey Regal, PharmD [phone]  10/15/2013,8:28 AM

## 2013-10-15 NOTE — Progress Notes (Signed)
Physical Therapy Treatment Patient Details Name: [name] MRN: [id number] DOB: May 11, 1946 Today's Date: 10/15/2013 Time: 4098-1191 PT Time Calculation (min): 25 min  PT Assessment / Plan / Recommendation  History of Present Illness Patient is a 67 yo male admitted with hypoglycemia and pericardial window.  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   PT Comments   Pt admitted with above. Pt currently with functional limitations due to balance and endurance as well as weakness deficits.  Pt will benefit from skilled PT to increase their independence and safety with mobility to allow discharge to the venue listed below.   Follow Up Recommendations  CIR;Supervision/Assistance - 24 hour                 Equipment Recommendations  None recommended by PT    Recommendations for Other Services Rehab consult  Frequency Min 3X/week   Progress towards PT Goals Progress towards PT goals: Progressing toward goals  Plan Current plan remains appropriate    Precautions / Restrictions Precautions Precautions: Fall Restrictions Weight Bearing Restrictions: No   Pertinent Vitals/Pain VSS, No pain    Mobility  Bed Mobility Bed Mobility: Rolling Right;Right Sidelying to Sit;Sitting - Scoot to [place] of Bed Rolling Right: 4: Min assist;With rail Right Sidelying to Sit: 3: Mod assist;With rails;HOB elevated Sitting - Scoot to Edge of Bed: 3: Mod assist Details for Bed Mobility Assistance: Pt needed assist for LEs and elevation of trunk. Transfers Transfers: Sit to Stand;Stand to Sit Sit to Stand: 3: Mod assist;From elevated surface;With upper extremity assist;From bed Stand to Sit: 3: Mod assist;With upper extremity assist;To chair/3-in-1;With armrests Details for Transfer Assistance: Pt needed cues for hand placement.  Raised bed fairly high so pt could stand from bed.   Ambulation/Gait Ambulation/Gait Assistance: 3: Mod  assist Ambulation Distance (Feet): 10 Feet (5 feet x 2) Assistive device: Rolling walker Ambulation/Gait Assistance Details: Pt with bil knee instability but was able to take a few steps forward and backward.  No knee buckling noted but very shaky and weak.  Was going to follow pt with chair therefore had +2 asssit but pt couldnt' ambulate far enough.  Pt significantly fatigued at end of sesssion.   Gait Pattern: Step-to pattern;Decreased stride length;Wide base of support Gait velocity: decreased Stairs: No Wheelchair Mobility Wheelchair Mobility: No    Exercises General Exercises - Lower Extremity Ankle Circles/Pumps: AROM;Both;5 reps;Supine Heel Slides: AAROM;Both;5 reps;Supine    PT Goals (current goals can now be found in the care plan section)    Visit Information  Last PT Received On: 10/15/13 Assistance Needed: +2 History of Present Illness: Patient is a 67 yo male admitted with hypoglycemia and pericardial window.  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    Subjective Data  Subjective: "I feel better but just get wiped out."   Cognition  Cognition Arousal/Alertness: Awake/alert Behavior During Therapy: WFL for tasks assessed/performed Overall Cognitive Status: Within Functional Limits for tasks assessed    Balance  Balance Balance Assessed: Yes Static Sitting Balance Static Sitting - Balance Support: Bilateral upper extremity supported;Feet supported Static Sitting - Level of Assistance: 5: Stand by assistance Static Sitting - Comment/# of Minutes: 3 Static Standing Balance Static Standing - Balance Support: Bilateral upper extremity supported;During functional activity Static Standing - Level of Assistance: 4: Min assist Static Standing - Comment/# of Minutes: 1  End of Session  PT - End of Session Equipment Utilized During Treatment: Gait belt Activity Tolerance: Patient limited by fatigue Patient  left: in bed;with call bell/phone within reach Nurse Communication: Mobility status;Need for lift equipment        [name] [name] 10/15/2013, 1:08 PM [facility] [phone] (pager)

## 2013-10-15 NOTE — Progress Notes (Signed)
Patient is appropriate for inpt rehab admission pending bed available when pt is medically ready. Discussed with Dr. Sharon Seller. [phone]

## 2013-10-15 NOTE — Consult Note (Signed)
WOC wound consult note Reason for Consult: re evaluation of the LE wounds. Pt seen by this WOC 10/08/13. Pt with multiple skin issues related to previous skin cancer removals.  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  Post removal site on the left pretibial area and had one area of trauma on the right lateral leg.  Today the patient reports the left leg is not changed and not painful but now the right lower extremity wound is more painful and "burning". He reports the treatment ordered for the left LE has not been performed as ordered since I saw him last therefore I did want to assess this wound today also. The left forearm lesion is unchanged.  Wound type: see above, trauma to the right LE, now with necrotic wound.  He does have a palpable pulse and no edema in this extremity Measurement: Right LE: 7cm x 2cm x 0 Left LE: 2.5cm x 3.0cm x 0.2cm   Wound bed:  Right LE: 100% yellow/brown slough, adherent to the wound bed. Left LE: clean with some fibrin but otherwise clean Drainage (amount, consistency, odor) both areas of the LE have minimal serosanguinous drainage Periwound: maceration of the LLE Dressing procedure/placement/frequency: Will change LLE wound care over to silver hydrofiber to manage drainage a bit better and hopefully lessen the maceration of the periwound.  Add enzymatic debridement ointment for the RLE to clear away to the necrotic tissue then I will re-eval the wound later in the week for change in the topical care at that point.   WOC will follow along with you for re evaluation of the RLE wound later in the week. Should patient be discharged will need follow up with his regular dermatologist for skin related issues.  Akashdeep Chuba [employer], [place] [phone] Conservative sharp wound debridement (CSWD performed at the bedside):

## 2013-10-15 NOTE — Progress Notes (Signed)
Chaplain to visit patient. Offered spiritual care. Prayed for surgery that patient needed. Had a long conversation about life and family.   Chaplain [place] Skene

## 2013-10-15 NOTE — H&P (Signed)
Referring Physician: Dr. Allena Katz HPI: [name] is an 67 y.o. male with PMHx of renal cancer in 1974 s/p renal transplant. The patient was admitted with lethargy and found to have a pericardial effusion s/p pericardial window 11/24 and acute on chronic kidney disease. The patient underwent a left IJ temporary trialysis placement on 11/26 and now Nephrology requesting temp catheter be converted to tunneled, next HD tuesday. He does have bilateral LE wounds and c/o pain. He denies any chest pain or shortness of breath. He denies any active bleeding. He denies any difficulty with previous sedation. He denies any fever or chills.   Past Medical History:  Past Medical History  Diagnosis Date  . CAD (coronary artery disease)     a. 03/1990 - s/p MI and PTCA  . Systolic congestive heart failure 01/2012    a. 09/2013 Echo: EF 40-45%, inf/infsept & post HK, sev LVH, mod reduced RV fxn, PASP , large pericardial eff - 39 mm in maximal dimension, no RV collapse, dilated IVC w/o resp variation.  . Hypertension   . Diabetes mellitus   . ICD (implantable cardiac defibrillator) in place     a. BSX 02/2012, in setting of cardiac arrest.  . History of pneumonia 2011  . Arthritis   . History of blood transfusion   . CKD (chronic kidney disease), stage IV     a. 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 Cancer     a. Bladder Cancer- 2004;  b. Skin Cancer- Basal, Squamous, and a few melanoma  . Right foot drop   . Cellulitis   . Pericardial effusion     a. 09/2013 Echo: large pericardial eff - 39 mm in maximal dimension, no RV collapse, dilated IVC w/o resp variation.    Past Surgical History:  Past Surgical History  Procedure Laterality Date  . Kidney transplant  02/13/1973  . Coronary angioplasty  03/1990  . Skin cancer excision      Numerous  . Eye surgery      Cataract Right Eye  . Insert / replace / remove pacemaker    . Uretheral transplation      Left to Right (transplanted  kidney)  . Skin transplant      Left thigh to Left hand  . Knee arthroscopy  07/25/2012    Procedure: ARTHROSCOPY KNEE;  Surgeon: Cammy Copa, MD;  Location: [facility] OR;  Service: Orthopedics;  Laterality: Left;  Left knee arthroscopy, debridement, cultures  . Lumbar laminectomy/decompression microdiscectomy Bilateral 03/26/2013    Procedure: LUMBAR LAMINECTOMY/DECOMPRESSION MICRODISCECTOMY 1 LEVEL;  Surgeon: Reinaldo Meeker, MD;  Location: [facility];  Service: Neurosurgery;  Laterality: Bilateral;  . Lumbar laminectomy/decompression microdiscectomy N/A 04/19/2013    Procedure: Redo Lumbar Three to Four Decompression One LEVEL;  Surgeon: Cristi Loron, MD;  Location: [facility];  Service: Neurosurgery;  Laterality: N/A;  Redo Lumbar Three to Four Decompression One LEVEL  . Subxyphoid pericardial window N/A 10/08/2013    Procedure: SUBXYPHOID PERICARDIAL WINDOW;  Surgeon: Delight Ovens, MD;  Location: [facility] OR;  Service: Thoracic;  Laterality: N/A;    Family History:  Family History  Problem Relation Age of Onset  . Diabetes Mellitus II Mother   . Lung cancer Father     Social History:  reports that he quit smoking about 30 years ago. He has never used smokeless tobacco. He reports that he does not drink alcohol or use illicit drugs.  Allergies: No Known Allergies  Medications:   Medication List    ASK your doctor about these medications       ALPRAZolam 0.25 MG tablet  Commonly known as:  XANAX  Take 0.25 mg by mouth 2 (two) times daily.     amLODipine 5 MG tablet  Commonly known as:  NORVASC  Take 5 mg by mouth 2 (two) times daily.     aspirin EC 81 MG tablet  Take 81 mg by mouth every evening.     atorvastatin 80 MG tablet  Commonly known as:  LIPITOR  Take 80 mg by mouth daily.     carvedilol 12.5 MG tablet  Commonly known as:  COREG  Take 12.5 mg by mouth 2 (two) times daily with a meal.     ferrous sulfate 325 (65 FE) MG EC tablet  Take 1 tablet (325 mg  total) by mouth 2 (two) times daily with a meal.     fish oil-omega-3 fatty acids 1000 MG capsule  Take 1 g by mouth daily.     furosemide 40 MG tablet  Commonly known as:  LASIX  Take 2 tablets (80 mg total) by mouth daily as needed (if weight is above 173 lbs).     gabapentin 300 MG capsule  Commonly known as:  NEURONTIN  Take 300 mg by mouth 2 (two) times daily.     HYDROcodone-acetaminophen 10-325 MG per tablet  Commonly known as:  NORCO  Take 1 tablet by mouth every 4 (four) hours as needed for moderate pain.     insulin aspart 100 UNIT/ML injection  Commonly known as:  novoLOG  Inject 6 Units into the skin 3 (three) times daily before meals.     LEVEMIR FLEXPEN 100 UNIT/ML Sopn  Generic drug:  Insulin Detemir  Inject 40 Units into the skin every morning.     multivitamin with minerals Tabs tablet  Take 1 tablet by mouth daily.     mycophenolate 500 MG tablet  Commonly known as:  CELLCEPT  Take 500 mg by mouth 2 (two) times daily.     nitroGLYCERIN 0.4 MG SL tablet  Commonly known as:  NITROSTAT  Place 0.4 mg under the tongue every 5 (five) minutes as needed for chest pain. x3 doses as needed for chest pain     predniSONE 5 MG tablet  Commonly known as:  DELTASONE  Take 5 mg by mouth 2 (two) times daily.     primidone 50 MG tablet  Commonly known as:  MYSOLINE  Take 50 mg by mouth at bedtime.     ranitidine 300 MG tablet  Commonly known as:  ZANTAC  Take 300 mg by mouth at bedtime.     tobramycin 0.3 % ophthalmic solution  Commonly known as:  TOBREX  Place 1 drop into both eyes as needed (for allergies).     vitamin C 500 MG tablet  Commonly known as:  ASCORBIC ACID  Take 500 mg by mouth daily.       Please HPI for pertinent positives, otherwise complete 10 system ROS negative.  Physical Exam: BP 125/72  Pulse 70  Temp(Src) 98.8 F (37.1 C) (Oral)  Resp 21  Ht 6\' 2"  (1.88 m)  Wt 186 lb 1.1 oz (84.4 kg)  BMI 23.88 kg/m2  SpO2 95% Body mass  index is 23.88 kg/(m^2).  General Appearance:  Alert, cooperative, no distress  Head:  Normocephalic, without obvious abnormality, atraumatic  Neck: Supple, symmetrical, trachea midline  Lungs:   Clear to  auscultation bilaterally, no w/r/r, respirations unlabored without use of accessory muscles.  Chest Wall:  No tenderness or deformity  Heart:  Regular rate and rhythm, S1, S2 normal, no murmur, rub or gallop.  Abdomen:   Soft, non-tender, non distended.  Extremities: Extremities with LE wounds bilateral, no cyanosis or edema. Right central IJ line in place, Left IJ temp trialysis line in place.   Neurologic: Normal affect, no gross deficits.   Results for orders placed during the hospital encounter of 10/07/13 (from the past 48 hour(s))  GLUCOSE, CAPILLARY     Status: Abnormal   Collection Time    10/13/13 11:59 AM      Result Value Range   Glucose-Capillary 127 (*) 70 - 99 mg/dL  GLUCOSE, CAPILLARY     Status: Abnormal   Collection Time    10/13/13  9:52 PM      Result Value Range   Glucose-Capillary 141 (*) 70 - 99 mg/dL  TSH     Status: None   Collection Time    10/14/13  6:00 AM      Result Value Range   TSH 1.385  0.350 - 4.500 uIU/mL   Comment: Performed at [facility]  GLUCOSE, CAPILLARY     Status: Abnormal   Collection Time    10/14/13  8:01 AM      Result Value Range   Glucose-Capillary 155 (*) 70 - 99 mg/dL  BASIC METABOLIC PANEL     Status: Abnormal   Collection Time    10/14/13  9:30 AM      Result Value Range   Sodium 135  135 - 145 mEq/L   Potassium 3.6  3.5 - 5.1 mEq/L   Chloride 100  96 - 112 mEq/L   CO2 26  19 - 32 mEq/L   Glucose, Bld 198 (*) 70 - 99 mg/dL   BUN 28 (*) 6 - 23 mg/dL   Comment: DELTA CHECK NOTED   Creatinine, Ser 1.64 (*) 0.50 - 1.35 mg/dL   Calcium 7.5 (*) 8.4 - 10.5 mg/dL   GFR calc non Af Amer 42 (*) >90 mL/min   GFR calc Af Amer 49 (*) >90 mL/min   Comment: (NOTE)     The eGFR has been calculated using the CKD EPI  equation.     This calculation has not been validated in all clinical situations.     eGFR's persistently <90 mL/min signify possible Chronic Kidney     Disease.  GLUCOSE, CAPILLARY     Status: Abnormal   Collection Time    10/14/13 12:13 PM      Result Value Range   Glucose-Capillary 202 (*) 70 - 99 mg/dL  GLUCOSE, CAPILLARY     Status: Abnormal   Collection Time    10/14/13  5:48 PM      Result Value Range   Glucose-Capillary 145 (*) 70 - 99 mg/dL  GLUCOSE, CAPILLARY     Status: Abnormal   Collection Time    10/14/13  9:20 PM      Result Value Range   Glucose-Capillary 152 (*) 70 - 99 mg/dL  CBC     Status: Abnormal   Collection Time    10/15/13  6:00 AM      Result Value Range   WBC 9.3  4.0 - 10.5 K/uL   RBC 2.42 (*) 4.22 - 5.81 MIL/uL   Hemoglobin 7.2 (*) 13.0 - 17.0 g/dL   HCT 16.1 (*) 09.6 - 04.5 %  MCV 94.6  78.0 - 100.0 fL   MCH 29.8  26.0 - 34.0 pg   MCHC 31.4  30.0 - 36.0 g/dL   RDW 40.9 (*) 81.1 - 91.4 %   Platelets 126 (*) 150 - 400 K/uL  PROTIME-INR     Status: Abnormal   Collection Time    10/15/13  6:00 AM      Result Value Range   Prothrombin Time 15.3 (*) 11.6 - 15.2 seconds   INR 1.24  0.00 - 1.49  RENAL FUNCTION PANEL     Status: Abnormal   Collection Time    10/15/13  6:00 AM      Result Value Range   Sodium 135  135 - 145 mEq/L   Potassium 3.9  3.5 - 5.1 mEq/L   Chloride 101  96 - 112 mEq/L   CO2 26  19 - 32 mEq/L   Glucose, Bld 168 (*) 70 - 99 mg/dL   BUN 33 (*) 6 - 23 mg/dL   Creatinine, Ser 7.82 (*) 0.50 - 1.35 mg/dL   Calcium 7.9 (*) 8.4 - 10.5 mg/dL   Phosphorus 3.2  2.3 - 4.6 mg/dL   Albumin 2.2 (*) 3.5 - 5.2 g/dL   GFR calc non Af Amer 32 (*) >90 mL/min   GFR calc Af Amer 37 (*) >90 mL/min   Comment: (NOTE)     The eGFR has been calculated using the CKD EPI equation.     This calculation has not been validated in all clinical situations.     eGFR's persistently <90 mL/min signify possible Chronic Kidney     Disease.  GLUCOSE,  CAPILLARY     Status: Abnormal   Collection Time    10/15/13  8:16 AM      Result Value Range   Glucose-Capillary 162 (*) 70 - 99 mg/dL   No results found.  Assessment/Plan Acute on chronic kidney disease with left IJ temporary trialysis in place. History of renal transplant 40 years ago. Request for temporary to be converted to tunneled HD catheter now. Patient has been NPO, sq heparin 5000 U given 0600, INR 1.24, ancef ordered.  Pericardial effusion s/p window 11/24  Risks and Benefits discussed with the patient. All of the patient's questions were answered, patient is agreeable to proceed. Consent signed and in chart.   Pattricia Boss D PA-C 10/15/2013, 9:22 AM

## 2013-10-15 NOTE — Consult Note (Signed)
Physical Medicine and Rehabilitation Consult Reason for Consult: Deconditioned Referring Physician: Dr. Shirlee Latch   HPI: [name] is a 67 y.o. 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 He has multiple medical problems including coronary artery disease status post PTCA/ICD, CHF (EF 40-45%), hypertension, insulin-dependent diabetes and a history of basal cell cancer BLE as well as non healing wound LLE. He was admitted on 10/07/13 with increasing SOB, hypoglycemia, confusion with decrease in urine output as well as acute on chronic renal failure with BUN 112/ Cr-3.3. Dr. Tyrone Sage consulted for input on large pericardial effusion and surgical intervention recommended.Dr Allena Katz consulted for input and renal failure and felt that patient with uremia causing confusion and poor likely hood or renal recovery.  Patient underwent subxiphoid pericardial window and drainage of pericardial effusion on 10/09/13. Hemodialysis initiated 11/26 due to fluid overload with poor recovery and plans for AVG if he does not imporve over next 24-48 hours.  IJ to be placed today.   He developed A fib and coumadin recommended by cardiology. Therapies initiated and CIR recommended by PT/MD. Patient with CIR stay 04/2013 past lumbar surgery and would like to pursue CIR for progressive therapies prior to discharge to home.    Review of Systems  HENT: Negative for hearing loss.   Eyes: Negative for blurred vision and double vision.  Respiratory: Negative for cough and shortness of breath.   Cardiovascular: Negative for chest pain and palpitations.  Gastrointestinal: Positive for diarrhea. Negative for heartburn and nausea.  Neurological: Negative for headaches.    Past Medical History  Diagnosis Date  .  CAD (coronary artery disease)     a. 03/1990 - s/p MI and PTCA  . Systolic congestive heart failure 01/2012    a. 09/2013 Echo: EF 40-45%, inf/infsept & post HK, sev LVH, mod reduced RV fxn, PASP , large pericardial eff - 39 mm in maximal dimension, no RV collapse, dilated IVC w/o resp variation.  . Hypertension   . Diabetes mellitus   . ICD (implantable cardiac defibrillator) in place     a. BSX 02/2012, in setting of cardiac arrest.  . History of pneumonia 2011  . Arthritis   . History of blood transfusion   . CKD (chronic kidney disease), stage IV     a. 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 Cancer     a. Bladder Cancer- 2004;  b. Skin Cancer- Basal, Squamous, and a few melanoma  . Right foot drop   . Cellulitis   . Pericardial effusion     a. 09/2013 Echo: large pericardial eff - 39 mm in maximal dimension, no RV collapse, dilated IVC w/o resp variation.   Past Surgical History  Procedure Laterality Date  . Kidney transplant  02/13/1973  . Coronary angioplasty  03/1990  . Skin cancer excision      Numerous  . Eye surgery      Cataract Right Eye  . Insert / replace / remove pacemaker    . Uretheral transplation      Left to Right (transplanted kidney)  . Skin transplant      Left thigh to Left hand  . Knee arthroscopy  07/25/2012    Procedure: ARTHROSCOPY KNEE;  Surgeon: Cammy Copa, MD;  Location: [facility] OR;  Service: Orthopedics;  Laterality: Left;  Left knee arthroscopy, debridement, cultures  . Lumbar laminectomy/decompression microdiscectomy Bilateral 03/26/2013    Procedure: LUMBAR LAMINECTOMY/DECOMPRESSION MICRODISCECTOMY 1 LEVEL;  Surgeon: Reinaldo Meeker, MD;  Location: [facility];  Service: Neurosurgery;  Laterality: Bilateral;  . Lumbar laminectomy/decompression microdiscectomy N/A 04/19/2013    Procedure: Redo Lumbar Three to Four Decompression One LEVEL;  Surgeon: Cristi Loron, MD;  Location: [facility];  Service: Neurosurgery;   Laterality: N/A;  Redo Lumbar Three to Four Decompression One LEVEL  . Subxyphoid pericardial window N/A 10/08/2013    Procedure: SUBXYPHOID PERICARDIAL WINDOW;  Surgeon: Delight Ovens, MD;  Location: [facility] OR;  Service: Thoracic;  Laterality: N/A;   Family History  Problem Relation Age of Onset  . Diabetes Mellitus II Mother   . Lung cancer Father    Social History:  Married.  Per reports that he quit smoking about 30 years ago. He has never used smokeless tobacco. Per reports that he does not drink alcohol or use illicit drugs.  Allergies: No Known Allergies  Medications Prior to Admission  Medication Sig Dispense Refill  . ALPRAZolam (XANAX) 0.25 MG tablet Take 0.25 mg by mouth 2 (two) times daily.       Marland Kitchen amLODipine (NORVASC) 5 MG tablet Take 5 mg by mouth 2 (two) times daily.      Marland Kitchen aspirin EC 81 MG tablet Take 81 mg by mouth every evening.       Marland Kitchen atorvastatin (LIPITOR) 80 MG tablet Take 80 mg by mouth daily.      . carvedilol (COREG) 12.5 MG tablet Take 12.5 mg by mouth 2 (two) times daily with a meal.      . ferrous sulfate 325 (65 FE) MG EC tablet Take 1 tablet (325 mg total) by mouth 2 (two) times daily with a meal.  60 tablet  0  . fish oil-omega-3 fatty acids 1000 MG capsule Take 1 g by mouth daily.      . furosemide (LASIX) 40 MG tablet Take 2 tablets (80 mg total) by mouth daily as needed (if weight is above 173 lbs).  60 tablet  1  . gabapentin (NEURONTIN) 300 MG capsule Take 300 mg by mouth 2 (two) times daily.      Marland Kitchen HYDROcodone-acetaminophen (NORCO) 10-325 MG per tablet Take 1 tablet by mouth every 4 (four) hours as needed for moderate pain.       Marland Kitchen insulin aspart (NOVOLOG) 100 UNIT/ML injection Inject 6 Units into the skin 3 (three) times daily before meals.      . Insulin Detemir (LEVEMIR FLEXPEN) 100 UNIT/ML SOPN Inject 40 Units into the skin every morning.       . Multiple Vitamin (MULTIVITAMIN WITH MINERALS) TABS Take 1 tablet by mouth daily.      . mycophenolate  (CELLCEPT) 500 MG tablet Take 500 mg by mouth 2 (two) times daily.       . nitroGLYCERIN (NITROSTAT) 0.4 MG SL tablet Place 0.4 mg under the tongue every 5 (five) minutes as needed for chest pain. x3 doses as needed for chest pain      . predniSONE (DELTASONE) 5 MG tablet Take 5 mg by mouth 2 (two) times daily.      . primidone (MYSOLINE) 50 MG tablet Take 50 mg by mouth at bedtime.       . ranitidine (ZANTAC) 300 MG tablet Take 300 mg by mouth at bedtime.       Marland Kitchen tobramycin (TOBREX)  0.3 % ophthalmic solution Place 1 drop into both eyes as needed (for allergies).      . vitamin C (ASCORBIC ACID) 500 MG tablet Take 500 mg by mouth daily.        Home: Home Living Family/patient expects to be discharged to:: Private residence Living Arrangements: Spouse/significant other Available Help at Discharge: Family;Available 24 hours/day Type of Home: House Home Access: Level entry Home Layout: One level Home Equipment: Walker - 2 wheels;Cane - single point;Wheelchair - manual;Shower seat;Grab bars - toilet;Grab bars - tub/shower;Shower seat - built in;Hand held shower head (handicapped toilet) Additional Comments: Pt and wife built a fully handicap accessible house.   Functional History:   Functional Status:  Mobility: Bed Mobility Bed Mobility: Rolling Left;Left Sidelying to Sit;Sitting - Scoot to Edge of Bed Rolling Left: 4: Min assist;With rail Left Sidelying to Sit: 3: Mod assist;With rails;HOB flat Sitting - Scoot to Edge of Bed: 3: Mod assist Transfers Transfers: Sit to Stand;Stand to Sit;Stand Pivot Transfers Sit to Stand: 3: Mod assist;From elevated surface;With upper extremity assist;From bed Stand to Sit: 3: Mod assist;With upper extremity assist;To chair/3-in-1;With armrests Stand Pivot Transfers: 3: Mod assist Ambulation/Gait Ambulation/Gait Assistance: Not tested (comment) Stairs: No Wheelchair Mobility Wheelchair Mobility: No  ADL:    Cognition: Cognition Overall  Cognitive Status: Within Functional Limits for tasks assessed Orientation Level: Oriented X4 Cognition Arousal/Alertness: Awake/alert Behavior During Therapy: WFL for tasks assessed/performed Overall Cognitive Status: Within Functional Limits for tasks assessed  Blood pressure 125/72, pulse 70, temperature 98.8 F (37.1 C), temperature source Oral, resp. rate 21, height 6\' 2"  (1.88 m), weight 84.4 kg (186 lb 1.1 oz), SpO2 95.00%. Physical Exam  Constitutional: He is oriented to person, place, and time. He appears well-developed and well-nourished.  HENT:  Head: Normocephalic and atraumatic.  Eyes: Conjunctivae are normal. Pupils are equal, round, and reactive to light.  Exopthalmus  Respiratory: Effort normal and breath sounds normal. No respiratory distress. He has no wheezes.  GI: Soft. Bowel sounds are normal. He exhibits no distension. There is no tenderness.  Musculoskeletal: He exhibits edema (BUE.).  Neurological: He is alert and oriented to person, place, and time.  Speech clear. Follows basic commands without difficulty. Occasional inappropriate comments but no confusion noted. Recalls being on rehab before. Strength 3/5 prox UE to 4- distally. LE 2+ prox to 3 distally. No focal sensory loss.   Skin: Skin is warm and dry.  Multiple dry flaky keratotic lesions. Dry dressings in bilateral lower shins.     Results for orders placed during the hospital encounter of 10/07/13 (from the past 24 hour(s))  BASIC METABOLIC PANEL     Status: Abnormal   Collection Time    10/14/13  9:30 AM      Result Value Range   Sodium 135  135 - 145 mEq/L   Potassium 3.6  3.5 - 5.1 mEq/L   Chloride 100  96 - 112 mEq/L   CO2 26  19 - 32 mEq/L   Glucose, Bld 198 (*) 70 - 99 mg/dL   BUN 28 (*) 6 - 23 mg/dL   Creatinine, Ser 1.61 (*) 0.50 - 1.35 mg/dL   Calcium 7.5 (*) 8.4 - 10.5 mg/dL   GFR calc non Af Amer 42 (*) >90 mL/min   GFR calc Af Amer 49 (*) >90 mL/min  GLUCOSE, CAPILLARY     Status:  Abnormal   Collection Time    10/14/13 12:13 PM      Result Value Range  Glucose-Capillary 202 (*) 70 - 99 mg/dL  GLUCOSE, CAPILLARY     Status: Abnormal   Collection Time    10/14/13  5:48 PM      Result Value Range   Glucose-Capillary 145 (*) 70 - 99 mg/dL  GLUCOSE, CAPILLARY     Status: Abnormal   Collection Time    10/14/13  9:20 PM      Result Value Range   Glucose-Capillary 152 (*) 70 - 99 mg/dL  CBC     Status: Abnormal   Collection Time    10/15/13  6:00 AM      Result Value Range   WBC 9.3  4.0 - 10.5 K/uL   RBC 2.42 (*) 4.22 - 5.81 MIL/uL   Hemoglobin 7.2 (*) 13.0 - 17.0 g/dL   HCT 96.0 (*) 45.4 - 09.8 %   MCV 94.6  78.0 - 100.0 fL   MCH 29.8  26.0 - 34.0 pg   MCHC 31.4  30.0 - 36.0 g/dL   RDW 11.9 (*) 14.7 - 82.9 %   Platelets 126 (*) 150 - 400 K/uL  PROTIME-INR     Status: Abnormal   Collection Time    10/15/13  6:00 AM      Result Value Range   Prothrombin Time 15.3 (*) 11.6 - 15.2 seconds   INR 1.24  0.00 - 1.49  RENAL FUNCTION PANEL     Status: Abnormal   Collection Time    10/15/13  6:00 AM      Result Value Range   Sodium 135  135 - 145 mEq/L   Potassium 3.9  3.5 - 5.1 mEq/L   Chloride 101  96 - 112 mEq/L   CO2 26  19 - 32 mEq/L   Glucose, Bld 168 (*) 70 - 99 mg/dL   BUN 33 (*) 6 - 23 mg/dL   Creatinine, Ser 5.62 (*) 0.50 - 1.35 mg/dL   Calcium 7.9 (*) 8.4 - 10.5 mg/dL   Phosphorus 3.2  2.3 - 4.6 mg/dL   Albumin 2.2 (*) 3.5 - 5.2 g/dL   GFR calc non Af Amer 32 (*) >90 mL/min   GFR calc Af Amer 37 (*) >90 mL/min   No results found.  Assessment/Plan: Diagnosis: deconditioning, metabolic encephalopathy 1. Does the need for close, 24 hr/day medical supervision in concert with the patient's rehab needs make it unreasonable for this patient to be served in a less intensive setting? Yes 2. Co-Morbidities requiring supervision/potential complications: htn, dm, cad, renal failure-->HD 3. Due to bladder management, bowel management, safety, skin/wound  care, disease management, medication administration, pain management and patient education, does the patient require 24 hr/day rehab nursing? Yes 4. Does the patient require coordinated care of a physician, rehab nurse, PT (1-2 hrs/day, 5 days/week), OT (1-2 hrs/day, 5 days/week) and SLP (1-2 hrs/day, 5 days/week) to address physical and functional deficits in the context of the above medical diagnosis(es)? Yes Addressing deficits in the following areas: balance, endurance, locomotion, strength, transferring, bowel/bladder control, bathing, dressing, feeding, grooming, toileting, cognition, language, swallowing and psychosocial support 5. Can the patient actively participate in an intensive therapy program of at least 3 hrs of therapy per day at least 5 days per week? Yes 6. The potential for patient to make measurable gains while on inpatient rehab is good 7. Anticipated functional outcomes upon discharge from inpatient rehab are supervision with PT, supervision to min assist with OT, supervision to mod I with SLP. 8. Estimated rehab length of stay to reach  the above functional goals is: 14-18 days 9. Does the patient have adequate social supports to accommodate these discharge functional goals? Yes 10. Anticipated D/C setting: Home 11. Anticipated post D/C treatments: HH therapy 12. Overall Rehab/Functional Prognosis: excellent  RECOMMENDATIONS: This patient's condition is appropriate for continued rehabilitative care in the following setting: CIR Patient has agreed to participate in recommended program. Yes Note that insurance prior authorization may be required for reimbursement for recommended care.  Comment: Rehab RN to follow up.   Ranelle Oyster, MD, [place] [facility]     10/15/2013

## 2013-10-15 NOTE — Progress Notes (Signed)
[facility] ROUNDING NOTE  ASSESSMENT/RECOMMENDATIONS:  1. Acute renal failure on chronic kidney disease stage IV.  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  It appears that he has progressed to ESRD. UOP now in oliguric range. "Good numbers" reflect fact that patient is now well dialyzed.    Planning for next HD Tuesday. Still has volume to remove  Only option for permanent access is a thigh AVG- [facility] aware He is to get tunnelled HD catheter today.   Started the CLIP process for OP HD unit placement [employer] as he lives in [place]) Will need to start taper of cellcept; prednisone will take [facility] longer (on this for ovber 40 years) 2. Shortness of breath/pericardial effusion: s/p pericardial window/effusion drainage Cytology benign and likely from uremia.  3. Anemia: anemia secondary to chronic kidney disease.  On Aranesp and status post intravenous iron therapy  4. Metabolic bone disease:  On calcitriol.  Phosphorus normal (no binders indicated at this time)  PTH 451.   Change calcitriol to Hectorol with HD 5. Nutrition: Low albumin noted and likely to be reflective of worsening chronic kidney disease.  Started on nutritional supplements 6. Ischemic cardiomyopathy EF 40-45 on recent echo ICD in place  Subjective:  Awake, alert, oriented, says "I'm going for a procedure"  (tunnelled HD catheter) Says very accepting of the need to go on HD Not sure of timing of thigh graft ([facility] has seen - Dr. Arbie Cookey) Main c/o is that leg ulcer is "burning"  Objective Vital signs in last 24 hours: Filed Vitals:   10/15/13 0022 10/15/13 0351 10/15/13 0827 10/15/13 0833  BP: 117/63 123/65 125/72 125/72  Pulse: 70 70 70 70  Temp: 98.1 F (36.7 C) 98.8 F (37.1 C) 98.8 F (37.1 C)   TempSrc: Axillary Axillary Oral   Resp: 16 18 21    Height:      Weight:  84.4 kg (186 lb 1.1 oz)    SpO2: 97% 95% 95%    Weight change: -0.3 kg (-10.6  oz)  Intake/Output Summary (Last 24 hours) at 10/15/13 0915 Last data filed at 10/15/13 0600  Gross per 24 hour  Intake     60 ml  Output    200 ml  Net   -140 ml   Physical Exam:  Blood pressure 125/72, pulse 70, temperature 98.8 F (37.1 C), temperature source Oral, resp. rate 21, height 6\' 2"  (1.88 m), weight 84.4 kg (186 lb 1.1 oz), SpO2 95.00%. [id number] resting in bed Skin covered in scaly lesions, hyperkeratotic skin lesions, scars from prior skin cancers and dressing over left lower tibial wound Lines in both sides of his neck with left sided ICD in place XBJ:YNWGN RRR, normal S1 and S2  Resp:CTA bilaterally, no rales/rhonchi but breath sounds diminished at the bases FAO:ZHYQ, NT, BS normal  Ext:1+ LE edema thighs  Labs: Basic Metabolic Panel:  Recent Labs Lab 10/10/13 0415 10/11/13 0500 10/12/13 0600 10/12/13 1523 10/13/13 0700 10/14/13 0930 10/15/13 0600  NA 134* 135 132* 132* 139 135 135  K 4.8 3.8 4.0 4.0 3.0* 3.6 3.9  CL 102 102 100 99 107 100 101  CO2 16* 20 19 19 20 26 26   GLUCOSE 203* 166* 128* 171* 96 198* 168*  BUN 112* 88* 91* 86* 47* 28* 33*  CREATININE 3.65* 2.84* 2.87* 2.89* 1.88* 1.64* 2.06*  CALCIUM 8.4 8.2* 8.1* 8.0* 6.9* 7.5* 7.9*  PHOS  --   --  4.0 3.8  --   --  3.2   Liver Function Tests:  Recent Labs Lab 10/08/13 1200 10/10/13 0415 10/12/13 1523 10/15/13 0600  AST 16 12  --   --   ALT 19 15  --   --   ALKPHOS 55 52  --   --   BILITOT 0.2* 0.2*  --   --   PROT 4.7* 4.3*  --   --   ALBUMIN 2.4* 2.0* 2.0* 2.2*   Recent Labs Lab 10/10/13 0415 10/12/13 0600 10/12/13 1523 10/15/13 0600  WBC 7.8 8.0 8.3 9.3  HGB 7.0* 7.5* 7.2* 7.2*  HCT 21.8* 22.8* 21.7* 22.9*  MCV 92.8 91.2 91.9 94.6  PLT 95* 109* 95* 126*    Recent Labs Lab 10/08/13 1405  TROPONINI <0.30   CBG:  Recent Labs Lab 10/14/13 0801 10/14/13 1213 10/14/13 1748 10/14/13 2120 10/15/13 0816  GLUCAP 155* 202* 145* 152* 162*    Iron Studies:   Recent Labs Lab 10/08/13 1406  IRON <10*  TIBC Not calculated due to Iron <10.  FERRITIN 524*   Studies/Results: No results found. Medications:   . ALPRAZolam  0.25 mg Oral BID  . aspirin  81 mg Oral Daily  . atorvastatin  80 mg Oral q1800  . bisacodyl  10 mg Oral Daily  . calcitRIOL  0.5 mcg Oral Daily  . carvedilol  25 mg Oral BID WC  . collagenase   Topical Daily  . darbepoetin (ARANESP) injection - DIALYSIS  60 mcg Intravenous Q Wed-HD  . docusate sodium  100 mg Oral Daily  . gabapentin  300 mg Oral QHS  . heparin subcutaneous  5,000 Units Subcutaneous Q8H  . insulin aspart  0-15 Units Subcutaneous TID WC  . insulin aspart  0-5 Units Subcutaneous QHS  . mycophenolate  500 mg Oral BID  . pantoprazole  40 mg Oral Daily  . predniSONE  5 mg Oral BID WC  . sodium chloride  3 mL Intravenous Q12H  . warfarin  7.5 mg Oral ONCE-1800  . Warfarin - Pharmacist Dosing Inpatient   Does not apply q1800    I  have reviewed scheduled and prn medications. Camille Bal, MD [facility] [phone] pager 10/15/2013, 9:15 AM

## 2013-10-16 ENCOUNTER — Inpatient Hospital Stay (HOSPITAL_COMMUNITY): Payer: Medicare Other

## 2013-10-16 LAB — PROTIME-INR
INR: 1.29 (ref 0.00–1.49)
Prothrombin Time: 15.8 seconds — ABNORMAL HIGH (ref 11.6–15.2)

## 2013-10-16 LAB — RENAL FUNCTION PANEL
CO2: 23 mEq/L (ref 19–32)
GFR calc Af Amer: 29 mL/min — ABNORMAL LOW (ref >90)
GFR calc non Af Amer: 25 mL/min — ABNORMAL LOW (ref >90)
Glucose, Bld: 153 mg/dL — ABNORMAL HIGH (ref 70–99)
Phosphorus: 3.9 mg/dL (ref 2.3–4.6)
Potassium: 4.3 mEq/L (ref 3.5–5.1)
Sodium: 136 mEq/L (ref 135–145)

## 2013-10-16 LAB — GLUCOSE, CAPILLARY
Glucose-Capillary: 131 mg/dL — ABNORMAL HIGH (ref 70–99)
Glucose-Capillary: 124 mg/dL — ABNORMAL HIGH (ref 70–99)
Glucose-Capillary: 139 mg/dL — ABNORMAL HIGH (ref 70–99)

## 2013-10-16 LAB — CBC
HCT: 21.9 % — ABNORMAL LOW (ref 39.0–52.0)
Hemoglobin: 6.9 g/dL — CL (ref 13.0–17.0)
RBC: 2.29 MIL/uL — ABNORMAL LOW (ref 4.22–5.81)
RDW: 15.9 % — ABNORMAL HIGH (ref 11.5–15.5)
WBC: 6.9 10*3/uL (ref 4.0–10.5)

## 2013-10-16 LAB — PREPARE RBC (CROSSMATCH)

## 2013-10-16 MED ORDER — OXYCODONE HCL 5 MG PO TABS
ORAL_TABLET | ORAL | Status: AC
  Filled 2013-10-16: qty 2

## 2013-10-16 MED ORDER — FENTANYL CITRATE 0.05 MG/ML IJ SOLN
INTRAMUSCULAR | Status: AC | PRN
  Administered 2013-10-16 (×2): 25 ug via INTRAVENOUS

## 2013-10-16 MED ORDER — MIDAZOLAM HCL 2 MG/2ML IJ SOLN
INTRAMUSCULAR | Status: AC
  Filled 2013-10-16: qty 4

## 2013-10-16 MED ORDER — CEFAZOLIN SODIUM-DEXTROSE 2-3 GM-% IV SOLR
2.0000 g | Freq: Three times a day (TID) | INTRAVENOUS | Status: AC
  Administered 2013-10-16: 2 g via INTRAVENOUS
  Filled 2013-10-16 (×3): qty 50

## 2013-10-16 MED ORDER — FENTANYL CITRATE 0.05 MG/ML IJ SOLN
INTRAMUSCULAR | Status: AC
  Filled 2013-10-16: qty 2

## 2013-10-16 MED ORDER — DOXERCALCIFEROL 4 MCG/2ML IV SOLN
INTRAVENOUS | Status: AC
  Filled 2013-10-16: qty 2

## 2013-10-16 MED ORDER — HEPARIN SODIUM (PORCINE) 1000 UNIT/ML IJ SOLN
INTRAMUSCULAR | Status: AC
  Filled 2013-10-16: qty 1

## 2013-10-16 MED ORDER — MIDAZOLAM HCL 2 MG/2ML IJ SOLN
INTRAMUSCULAR | Status: AC | PRN
  Administered 2013-10-16: 0.5 mg via INTRAVENOUS
  Administered 2013-10-16: 1 mg via INTRAVENOUS

## 2013-10-16 MED ORDER — MYCOPHENOLATE MOFETIL 250 MG PO CAPS
250.0000 mg | ORAL_CAPSULE | Freq: Two times a day (BID) | ORAL | Status: DC
  Administered 2013-10-16 – 2013-10-17 (×2): 250 mg via ORAL
  Filled 2013-10-16 (×3): qty 1

## 2013-10-16 MED ORDER — GELATIN ABSORBABLE 12-7 MM EX MISC
CUTANEOUS | Status: AC
  Filled 2013-10-16: qty 1

## 2013-10-16 MED ORDER — WARFARIN SODIUM 7.5 MG PO TABS
7.5000 mg | ORAL_TABLET | Freq: Once | ORAL | Status: AC
  Administered 2013-10-16: 7.5 mg via ORAL
  Filled 2013-10-16: qty 1

## 2013-10-16 MED ORDER — DARBEPOETIN ALFA-POLYSORBATE 60 MCG/0.3ML IJ SOLN
INTRAMUSCULAR | Status: AC
  Filled 2013-10-16: qty 0.3

## 2013-10-16 NOTE — Progress Notes (Signed)
I met with pt at bedside in dialysis. Pt previously in inpt rehab this summer and did well. He is appropriate for inpt rehab when medically ready pending bed availability. He is in agreement. Would like to admit pt over the next 24 to 48 hrs. I will follow up in the morning. [phone]

## 2013-10-16 NOTE — PMR Pre-admission (Signed)
PMR Admission Coordinator Pre-Admission Assessment  Patient: [name] is an 67 y.o., male MRN: [id number] DOB: 05-Dec-1945 Height: 6\' 2"  (188 cm) Weight: 82.7 kg (182 lb 5.1 oz)              Insurance Information HMO:     PPO:      PCP:      IPA:      80/20: yes     OTHER: no HMO PRIMARY: Medicare a and b      Policy#: [id number]      Subscriber: pt Benefits:  Phone #: online     Name: 10/16/13 Eff. Date: 08/15/2013     Deduct: $1216      Out of Pocket Max: none      Life Max: none CIR: 100%      SNF: 20 ful days Outpatient: 80%     Co-Pay: 20% Home Health: 100%      Co-Pay: none DME: 80%     Co-Pay: 20% Providers: pt choice  SECONDARY: [employer] [place]      Policy#: [id number]      Subscriber: pt  Emergency Contact Information Contact Information   Name Relation Home Work Mobile   Fuster,Diane Spouse [phone]  [phone]     Current Medical History  Patient Admitting Diagnosis: deconditioning, metabolic encephalopathy  History of Present Illness: [name] is a 67 y.o. 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 He has multiple medical problems including coronary artery disease status post PTCA/ICD, CHF (EF 40-45%), hypertension, insulin-dependent diabetes and a history of basal cell cancer BLE as well as non healing wound LLE.   He was admitted on 10/07/13 with increasing SOB, hypoglycemia, confusion with decrease in urine output as well as acute on chronic renal failure with BUN 112/ Cr-3.3. Dr. Tyrone Sage consulted for input on large pericardial effusion and surgical intervention recommended.Dr Allena Katz consulted for input and renal failure and felt that patient with uremia causing confusion and poor likely hood or renal recovery. Patient underwent  subxiphoid pericardial window and drainage of pericardial effusion on 10/09/13. Hemodialysis initiated 11/26 .  Patient with nausea today without emesis. Having BMs over last several days with laxatives. Possibly related to dialysis last evening. Will monitor.  Past Medical History  Past Medical History  Diagnosis Date  . CAD (coronary artery disease)     a. 03/1990 - s/p MI and PTCA  . Systolic congestive heart failure 01/2012    a. 09/2013 Echo: EF 40-45%, inf/infsept & post HK, sev LVH, mod reduced RV fxn, PASP , large pericardial eff - 39 mm in maximal dimension, no RV collapse, dilated IVC w/o resp variation.  . Hypertension   . Diabetes mellitus   . ICD (implantable cardiac defibrillator) in place     a. BSX 02/2012, in setting of cardiac arrest.  . History of pneumonia 2011  . Arthritis   . History of blood transfusion   . CKD (chronic kidney disease), stage IV     a. 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 Cancer     a. Bladder Cancer- 2004;  b. Skin Cancer- Basal, Squamous, and a few melanoma  . Right foot drop   . Cellulitis   . Pericardial effusion     a. 09/2013  Echo: large pericardial eff - 39 mm in maximal dimension, no RV collapse, dilated IVC w/o resp variation.    Family History  family history includes Diabetes Mellitus II in his mother; Lung cancer in his father.  Prior Rehab/Hospitalizations: CIR 04/2013   Current Medications  Current facility-administered medications:acetaminophen (TYLENOL) tablet 650 mg, 650 mg, Oral, Q6H PRN, Lonia Farber, MD, 650 mg at 10/13/13 1610;  ALPRAZolam Prudy Feeler) tablet 0.25 mg, 0.25 mg, Oral, BID, Therisa Doyne, MD, 0.25 mg at 10/21/2013 1151;  aspirin chewable tablet 81 mg, 81 mg, Oral, Daily, Lewayne Bunting, MD, 81 mg at 10/26/2013 1151 atorvastatin (LIPITOR) tablet 80 mg, 80 mg, Oral, q1800, Lewayne Bunting, MD, 80 mg at 10/15/13 1858;  bisacodyl (DULCOLAX) EC tablet 10 mg, 10 mg, Oral, Daily,  Donielle M Zimmerman, PA-C, 10 mg at 10/18/2013 1152;  bisacodyl (DULCOLAX) suppository 10 mg, 10 mg, Rectal, Daily PRN, Zigmund Gottron, MD;  carvedilol (COREG) tablet 25 mg, 25 mg, Oral, BID WC, Lewayne Bunting, MD, 25 mg at 11/07/2013 9604 collagenase (SANTYL) ointment, , Topical, Daily, Lonia Blood, MD;  darbepoetin (ARANESP) injection 60 mcg, 60 mcg, Intravenous, Q Tue-HD, Sadie Haber, MD, 60 mcg at 10/16/13 1428;  docusate sodium (COLACE) capsule 100 mg, 100 mg, Oral, Daily, Zigmund Gottron, MD, 100 mg at 10/16/2013 1151;  doxercalciferol (HECTOROL) injection 2 mcg, 2 mcg, Intravenous, Q T,Th,Sa-HD, Sadie Haber, MD, 2 mcg at 10/16/13 1427 gabapentin (NEURONTIN) capsule 300 mg, 300 mg, Oral, QHS, Jay K. Allena Katz, MD, 300 mg at 10/16/13 2259;  insulin aspart (novoLOG) injection 0-15 Units, 0-15 Units, Subcutaneous, TID WC, Lonia Farber, MD, 2 Units at 11/06/2013 1157;  insulin aspart (novoLOG) injection 0-5 Units, 0-5 Units, Subcutaneous, QHS, Konstantin Zubelevitskiy, MD;  mycophenolate (CELLCEPT) capsule 250 mg, 250 mg, Oral, BID, Sadie Haber, MD, 250 mg at 11/03/2013 1150 ondansetron [address] [employer]) injection 4 mg, 4 mg, Intravenous, Q6H PRN, Ardelle Balls, PA-C, 4 mg at 11/05/2013 5409;  oxyCODONE (Oxy IR/ROXICODONE) immediate release tablet 5-10 mg, 5-10 mg, Oral, Q4H PRN, Lonia Blood, MD, 10 mg at 11/02/2013 1154;  pantoprazole (PROTONIX) EC tablet 40 mg, 40 mg, Oral, Daily, Donielle M Zimmerman, PA-C, 40 mg at 11/11/2013 1151 predniSONE (DELTASONE) tablet 5 mg, 5 mg, Oral, BID WC, Lonia Farber, MD, 5 mg at 11/08/2013 8119;  senna-docusate (Senokot-S) tablet 1 tablet, 1 tablet, Oral, QHS PRN, Ardelle Balls, PA-C, 1 tablet at 10/11/13 2300;  sorbitol 70 % solution 30 mL, 30 mL, Oral, Daily PRN, Zigmund Gottron, MD, 30 mL at 10/12/13 0102;  tobramycin (TOBREX) 0.3 % ophthalmic solution 1 drop, 1 drop, Both Eyes, PRN, Therisa Doyne, MD warfarin  (COUMADIN) tablet 5 mg, 5 mg, Oral, ONCE-1800, Maryann Mikhail, DO;  Warfarin - Pharmacist Dosing Inpatient, , Does not apply, q1800, Hilario Quarry Amend, RPH;  zolpidem (AMBIEN) tablet 5 mg, 5 mg, Oral, QHS PRN, Lonia Blood, MD, 5 mg at 10/16/13 2259  Patients Current Diet: Carb Control  Precautions / Restrictions Precautions Precautions: Fall Restrictions Weight Bearing Restrictions: No   Prior Activity Level   Home Assistive Devices / Equipment Home Assistive Devices/Equipment: Shower chair with back;Raised toilet seat with rails;Grab bars in shower;Grab bars around toilet;Eyeglasses;Walker (specify type) Home Equipment: Walker - 2 wheels;Cane - single point;Wheelchair - manual;Shower seat;Grab bars - toilet;Grab bars - tub/shower;Shower seat - built in;Hand held shower head (handicapped toilet)  Prior Functional Level Prior Function Level of Independence: Needs assistance Gait / Transfers Assistance Needed: Wife provides  supervision for ambulation.  Patient uses RW ADL's / Homemaking Assistance Needed: Assist for meal prep, housekeeping, bathing  Current Functional Level Cognition  Overall Cognitive Status: Within Functional Limits for tasks assessed Orientation Level: Oriented X4    Extremity Assessment (includes Sensation/Coordination)          ADLs       Mobility  Bed Mobility: Rolling Right;Right Sidelying to Sit;Sitting - Scoot to [place] of Bed Rolling Right: 4: Min assist;With rail Rolling Left: 4: Min assist;With rail Right Sidelying to Sit: 3: Mod assist;With rails;HOB elevated Left Sidelying to Sit: 3: Mod assist;With rails;HOB flat Sitting - Scoot to Edge of Bed: 3: Mod assist    Transfers  Transfers: Sit to Stand;Stand to Sit Sit to Stand: 3: Mod assist;From elevated surface;With upper extremity assist;From bed Stand to Sit: 3: Mod assist;With upper extremity assist;To chair/3-in-1;With armrests Stand Pivot Transfers: 3: Mod assist    Ambulation /  Gait / Stairs / Wheelchair Mobility  Ambulation/Gait Ambulation/Gait Assistance: 3: Mod assist Ambulation Distance (Feet): 10 Feet (5 feet x 2) Assistive device: Rolling walker Ambulation/Gait Assistance Details: Pt with bil knee instability but was able to take a few steps forward and backward.  No knee buckling noted but very shaky and weak.  Was going to follow pt with chair therefore had +2 asssit but pt couldnt' ambulate far enough.  Pt significantly fatigued at end of sesssion.   Gait Pattern: Step-to pattern;Decreased stride length;Wide base of support Gait velocity: decreased Stairs: No Wheelchair Mobility Wheelchair Mobility: No    Posture / Balance Static Sitting Balance Static Sitting - Balance Support: Bilateral upper extremity supported;Feet supported Static Sitting - Level of Assistance: 5: Stand by assistance Static Sitting - Comment/# of Minutes: 3 Static Standing Balance Static Standing - Balance Support: Bilateral upper extremity supported;During functional activity Static Standing - Level of Assistance: 4: Min assist Static Standing - Comment/# of Minutes: 1    Special needs/care consideration Dialysis [facility]        [place], Thurs, Sat  Second shift Bowel mgmt: [facility] Bladder mgmt: [facility] Diabetic mgmt yes   Previous Home Environment Living Arrangements: Spouse/significant other  Lives With: Spouse Available Help at Discharge: Family;Available 24 hours/day Type of Home: House Home Layout: One level Home Access: Level entry Bathroom Shower/Tub: Health visitor: Handicapped height Bathroom Accessibility: Yes How Accessible: Accessible via wheelchair;Accessible via walker Home Care Services: No Additional Comments: Pt and wife built a fully handicap accessible house.   Discharge Living Setting Plans for Discharge Living Setting: Patient's home;Lives with (comment);Other (Comment) (wife) Type of Home at Discharge:  House Discharge Home Layout: One level Discharge Home Access: Level entry Discharge Bathroom Shower/Tub: Walk-in shower Discharge Bathroom Toilet: Handicapped height Discharge Bathroom Accessibility: Yes How Accessible: Accessible via wheelchair;Accessible via walker Does the patient have any problems obtaining your medications?: No  Social/Family/Support Systems Patient Roles: Spouse Contact Information: Leodis Sias, daughter Anticipated Caregiver: wife Anticipated Caregiver's Contact Information: see above Ability/Limitations of Caregiver: no limitations Caregiver Availability: 24/7 Discharge Plan Discussed with Primary Caregiver: Yes Is Caregiver In Agreement with Plan?: Yes Does Caregiver/Family have Issues with Lodging/Transportation while Pt is in Rehab?: No  Goals/Additional Needs Patient/Family Goal for Rehab: supervision with PT and OT Expected length of stay: ELOS 14- 18 days Dietary Needs: renal diet Special Service Needs: ESRD new to dialysis after 40 yrs kidney transplant. [place] [facility] T, TH, SAT second shift Pt/Family Agrees to Admission and willing to participate: Yes Program Orientation Provided & Reviewed  with Pt/Caregiver Including Roles  & Responsibilities: Yes   Decrease burden of Care through IP rehab admission: n/a  Possible need for SNF placement upon discharge:n/a  Patient Condition: This patient's condition remains as documented in the consult dated 10/15/2013, in which the Rehabilitation Physician determined and documented that the patient's condition is appropriate for intensive rehabilitative care in an inpatient rehabilitation facility. Will admit to inpatient rehab today.  Preadmission Screen Completed By:  Clois Dupes, 11/08/2013 12:36 PM ______________________________________________________________________   Discussed status with Dr. Riley Kill on 11/04/2013 at  1236 and received telephone approval for admission today.  Admission  Coordinator:  Clois Dupes, time 1610 Date 10/16/2013

## 2013-10-16 NOTE — Progress Notes (Signed)
[facility] Progress Note [place] TEAM 1 - Stepdown/ICU TEAM   JODIE LEINER ZOX:[id number] DOB: 07-15-1946 DOA: 10/07/2013 PCP: [name], MD  Brief narrative: 67 year old male status post failing renal transplant on immunosuppressants. He also has a history of coronary artery disease, systolic CHF, insulin-dependent diabetes mellitus, hypertension s/p pacemaker. The patient came to the hospital for lethargy felt to be related to hypoglycemia and was found to have a large pericardial effusion suspected to be related to uremia. He did not have tamponade features. He underwent a pericardial window on 11/24.  Due to a failing transplant, he was initiated on dialysis on 11/26.  HPI/Subjective: Pt endorsed improved sleep overnight after addition of Ambien.  He denies chest pain fevers chills nausea vomiting or abdominal pain.  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 -Continue immunosuppressants per Nephrology   Anemia -PRBCs ordered overnight -suspect degree of dilutional component since has not had HD since Saturday -plan transfuse once in HD to avoid volume overload  Newly diagnosed Afib - care as per Cardiology - noted on interrogation of pacer - warfarin initiated   Chronic pericardial effusion with acute symptoms - s/p pericardial window - chest tube has been removed and patient is stable  Severe seborrhoic dermatitis / Multiple skin cancers - as a result of chronic immunosuppression - follows with dermatology regularly - non-healing ulcer on post right upper arm due to resection of skin cancer - follow  - B LE wounds per [facility] RN   HTN  - controlled on current  medication  Diabetes mellitus type 2, controlled -  CBG reasonably controlled on current treatment  Acute on chronic systolic CHF  - 18 pounds over weight on admission when compared with last month - volume status is now being managed by via dialysis  Deconditioning -to CIR 12/3 if remains medically stable  Code Status: FULL Family Communication: spoke w/ patient - wife not present at time of exam today Disposition Plan: Transfer to Floor  continue warfarin load - OK for CIR once medically stable- likely 12/3  Consultants: Nephrology TCTS Vascular Surgery Cardiology   Procedures: 11/24 TTE >>> Large pericardial effusion without tamponade, EF 40-45%  11/24 OR >>> Pericardial window  11/26  Left IJ trialysis cath placed by IR  Antibiotics: Zinacef 11/24 >>> 11/25  DVT prophylaxis: SQ Heparin >> warfarin  Objective: Blood pressure 124/68, pulse 68, temperature 98.3 F (36.8 C), temperature source Oral, resp. rate 14, height 6\' 2"  (1.88 m), weight 196 lb 3.4 oz (89 kg), SpO2 96.00%.  Intake/Output Summary (Last 24 hours) at 10/16/13 1318 Last data filed at 10/16/13 0857  Gross per 24 hour  Intake     40 ml  Output    300 ml  Net   -260 ml   Exam: General: No acute respiratory distress at rest  Lungs: Clear to auscultation bilaterally without wheezes or crackles -bibasilar diminished sounds Cardiovascular: Regular rate and rhythm without murmur gallop or rub normal S1 and S2 Abdomen: Nontender, nondistended, soft, bowel sounds positive, no rebound, no ascites, no appreciable mass Extremities: No significant cyanosis, clubbing, or edema bilateral lower extremities Cutaneous:  Bilateral lower extremity wounds now dressed dry and clean  Data Reviewed: Basic Metabolic Panel:  Recent Labs Lab  10/12/13 0600 10/12/13 1523 10/13/13 0700 10/14/13 0930 10/15/13 0600 10/16/13 0525  NA 132* 132* 139 135 135 136  K 4.0 4.0 3.0* 3.6 3.9 4.3  CL 100 99 107 100 101 100   CO2 19 19 20 26 26 23   GLUCOSE 128* 171* 96 198* 168* 153*  BUN 91* 86* 47* 28* 33* 38*  CREATININE 2.87* 2.89* 1.88* 1.64* 2.06* 2.50*  CALCIUM 8.1* 8.0* 6.9* 7.5* 7.9* 7.9*  MG 1.7  --  1.4*  --   --   --   PHOS 4.0 3.8  --   --  3.2 3.9   Liver Function Tests:  Recent Labs Lab 10/10/13 0415 10/12/13 1523 10/15/13 0600 10/16/13 0525  AST 12  --   --   --   ALT 15  --   --   --   ALKPHOS 52  --   --   --   BILITOT 0.2*  --   --   --   PROT 4.3*  --   --   --   ALBUMIN 2.0* 2.0* 2.2* 2.1*   CBC:  Recent Labs Lab 10/10/13 0415 10/12/13 0600 10/12/13 1523 10/15/13 0600 10/16/13 0525  WBC 7.8 8.0 8.3 9.3 6.9  HGB 7.0* 7.5* 7.2* 7.2* 6.9*  HCT 21.8* 22.8* 21.7* 22.9* 21.9*  MCV 92.8 91.2 91.9 94.6 95.6  PLT 95* 109* 95* 126* 128*   Cardiac Enzymes: No results found for this basename: CKTOTAL, CKMB, CKMBINDEX, TROPONINI,  in the last 168 hours BNP (last 3 results)  Recent Labs  04/09/13 1856 10/08/13 0825  PROBNP 21940.0* 10879.0*   CBG:  Recent Labs Lab 10/15/13 1151 10/15/13 1602 10/15/13 2109 10/16/13 0809 10/16/13 1247  GLUCAP 136* 109* 139* 131* 124*    Recent Results (from the past 240 hour(s))  MRSA PCR SCREENING     Status: None   Collection Time    10/08/13  2:45 AM      Result Value Range Status   MRSA by PCR NEGATIVE  NEGATIVE Final   Comment:            The GeneXpert MRSA Assay (FDA     approved for NASAL specimens     only), is one component of a     comprehensive MRSA colonization     surveillance program. It is not     intended to diagnose MRSA     infection nor to guide or     monitor treatment for     MRSA infections.  AFB CULTURE WITH SMEAR     Status: None   Collection Time    10/08/13  2:50 PM      Result Value Range Status   Specimen Description FLUID PERICARDIAL   Final   Special Requests NONE   Final   ACID FAST SMEAR     Final   Value: NO ACID FAST BACILLI SEEN     Performed at [facility]   Culture      Final   Value: CULTURE WILL BE EXAMINED FOR 6 WEEKS BEFORE ISSUING A FINAL REPORT     Performed at [facility]   Report Status PENDING   Incomplete  BODY FLUID CULTURE     Status: None   Collection Time    10/08/13  2:50 PM      Result Value Range Status   Specimen Description FLUID PERICARDIAL   Final   Special Requests NONE   Final   Gram Stain  Final   Value: RARE WBC PRESENT,BOTH PMN AND MONONUCLEAR     NO ORGANISMS SEEN     Performed at [facility]     Performed at [facility]   Culture     Final   Value: NO GROWTH 3 DAYS     Performed at [facility]   Report Status 10/11/2013 FINAL   Final  GRAM STAIN     Status: None   Collection Time    10/08/13  2:50 PM      Result Value Range Status   Specimen Description FLUID PERICARDIAL   Final   Special Requests NONE   Final   Gram Stain     Final   Value: RARE WBC PRESENT,BOTH PMN AND MONONUCLEAR     NO ORGANISMS SEEN   Report Status 10/08/2013 FINAL   Final     Studies:  Recent x-ray studies have been reviewed in detail by the Attending Physician  Scheduled Meds:  Scheduled Meds: . ALPRAZolam  0.25 mg Oral BID  . aspirin  81 mg Oral Daily  . atorvastatin  80 mg Oral q1800  . bisacodyl  10 mg Oral Daily  . carvedilol  25 mg Oral BID WC  . collagenase   Topical Daily  . darbepoetin (ARANESP) injection - DIALYSIS  60 mcg Intravenous Q Tue-HD  . docusate sodium  100 mg Oral Daily  . doxercalciferol  2 mcg Intravenous Q T,Th,Sa-HD  . fentaNYL      . gabapentin  300 mg Oral QHS  . gelatin adsorbable      . heparin      . insulin aspart  0-15 Units Subcutaneous TID WC  . insulin aspart  0-5 Units Subcutaneous QHS  . midazolam      . mycophenolate  500 mg Oral BID  . pantoprazole  40 mg Oral Daily  . predniSONE  5 mg Oral BID WC  . warfarin  7.5 mg Oral ONCE-1800  . Warfarin - Pharmacist Dosing Inpatient   Does not apply q1800    Time spent on care of this patient: 35  min   ELLIS,ALLISON L., ANP  [facility] Office  [phone] Pager - Text Page per Loretha Stapler as per below:  On-Call/Text Page:      [email or web address]      password [email or web address]  If 7PM-7AM, please contact night-coverage [email or web address] Password [email or web address] 10/16/2013, 1:18 PM   LOS: 9 days   I have personally examined this patient and reviewed the entire database. I have reviewed the above note, made any necessary editorial changes, and agree with its content.  Lonia Blood, MD [facility]   I have personally examined the patient and reviewed the entire database. Agree with the above note and plan as outlined, any necessary changes made.  RAI,RIPUDEEP M.D. [facility] 10/16/2013, 4:50 PM Pager: [phone]  If 7PM-7AM, please contact night-coverage [email or web address] Password [email or web address]

## 2013-10-16 NOTE — Progress Notes (Signed)
ANTICOAGULATION CONSULT NOTE   Pharmacy Consult for Coumadin Indication: atrial fibrillation  No Known Allergies  Labs:  Recent Labs  10/14/13 0930 10/15/13 0600 10/16/13 0525  HGB  --  7.2* 6.9*  HCT  --  22.9* 21.9*  PLT  --  126* 128*  LABPROT  --  15.3* 15.8*  INR  --  1.24 1.29  CREATININE 1.64* 2.06* 2.50*    Estimated Creatinine Clearance: 34.3 ml/min (by C-G formula based on Cr of 2.5).  Assessment: 67 y.o. male to begin coumadin for afib. Coumadin held yesterday for placement HD cath  Goal of Therapy:  INR 2-3 Monitor platelets by anticoagulation protocol: Yes   Plan:  1. Coumadin 7.5 mg po tonight 2.  Daily PT/INR  Thank you Talbert Cage, PharmD 10/16/2013,12:39 PM

## 2013-10-16 NOTE — Progress Notes (Signed)
10/16/2013 10:14 AM Hemodialysis Outpatient Note; this patient has been accepted at the [facility] on a Tuesday Thursday and Saturday 2nd shift schedule. The center can begin treatment on Thursday December 4th at 10:45 AM to sign paperwork/consents and begin treatment. Thank you. Tilman Neat

## 2013-10-16 NOTE — Progress Notes (Signed)
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  Has progressed to ESRD. UOP now in oliguric range. "Good numbers" reflect fact that patient is now well dialyzed.    HD today - slowly lowering volume Only option for permanent access is a thigh AVG- [facility] aware - will check with them on timing Got tunnelled HD catheter today.   OP HD unit placement [facility] TTS Establishing EDW (still has edema)  Will need to start taper of cellcept - reduce to 250 BID Prednisone will take [facility] longer (on this for over 40 years)  Shortness of breath/pericardial effusion: s/p pericardial window/effusion drainage Cytology benign and likely from uremia.   Anemia: anemia secondary to chronic kidney disease.  On Aranesp and status post intravenous iron therapy   Metabolic bone disease:  Phosphorus normal (no binders indicated at this time)  PTH 451.   Changed calcitriol to Hectorol with HD  Nutrition: Low albumin noted and likely to be reflective of worsening chronic kidney disease.  Started on nutritional supplements  Ischemic cardiomyopathy EF 40-45 on recent echo ICD in place   Subjective:  Still not sure of timing of thigh graft  ([facility] has seen - Dr. Arbie Cookey) Main c/o is that leg ulcer is "burning" and that he missed 2 meals because of his catheter procedure today  Objective Vital signs in last 24 hours: Filed Vitals:   10/16/13 1235 10/16/13 1325 10/16/13 1339 10/16/13 1341  BP: 124/68  124/69 136/66  Pulse: 68  70 71  Temp:   97.9 F (36.6 C)   TempSrc:   Oral   Resp: 14  15 15   Height:      Weight:  86.7 kg (191 lb 2.2 oz)    SpO2: 96%  95%    Weight change: 4.6 kg (10 lb 2.3 oz)  Intake/Output Summary (Last 24 hours) at 10/16/13 1426 Last data filed at 10/16/13 0857  Gross per  24 hour  Intake     40 ml  Output    300 ml  Net   -260 ml   Physical Exam:  Blood pressure 125/72, pulse 70, temperature 98.8 F (37.1 C), temperature source Oral, resp. rate 21, height 6\' 2"  (1.88 m), weight 84.4 kg (186 lb 1.1 oz), SpO2 95.00%. UJW:[id number] resting in bed Skin covered in scaly lesions, hyperkeratotic skin lesions, scars from prior skin cancers and dressing over left lower tibial wound Lines in both sides of his neck with left sided ICD in place OZH:YQMVH RRR, normal S1 and S2  Resp:CTA bilaterally, no rales/rhonchi but breath sounds diminished at the bases QIO:NGEX, NT, BS normal  Ext:1+ LE edema thighs New left tunnelled catheter with dressing in place   Labs: Basic Metabolic Panel:  Recent Labs Lab 10/11/13 0500 10/12/13 0600 10/12/13 1523 10/13/13 0700 10/14/13 0930 10/15/13 0600 10/16/13 0525  NA 135 132* 132* 139 135 135 136  K 3.8 4.0 4.0 3.0* 3.6 3.9 4.3  CL 102 100 99 107 100 101 100  CO2 20 19 19 20 26 26 23   GLUCOSE 166* 128* 171* 96 198* 168* 153*  BUN 88* 91* 86* 47* 28* 33* 38*  CREATININE 2.84* 2.87* 2.89* 1.88* 1.64* 2.06* 2.50*  CALCIUM 8.2* 8.1* 8.0* 6.9* 7.5* 7.9* 7.9*  PHOS  --  4.0 3.8  --   --  3.2 3.9  Recent Labs Lab 10/10/13 0415 10/12/13 1523 10/15/13 0600 10/16/13 0525  AST 12  --   --   --   ALT 15  --   --   --   ALKPHOS 52  --   --   --   BILITOT 0.2*  --   --   --   PROT 4.3*  --   --   --   ALBUMIN 2.0* 2.0* 2.2* 2.1*    Recent Labs Lab 10/12/13 0600 10/12/13 1523 10/15/13 0600 10/16/13 0525  WBC 8.0 8.3 9.3 6.9  HGB 7.5* 7.2* 7.2* 6.9*  HCT 22.8* 21.7* 22.9* 21.9*  MCV 91.2 91.9 94.6 95.6  PLT 109* 95* 126* 128*   No results found for this basename: CKTOTAL, CKMB, CKMBINDEX, TROPONINI,  in the last 168 hours CBG:  Recent Labs Lab 10/15/13 1151 10/15/13 1602 10/15/13 2109 10/16/13 0809 10/16/13 1247  GLUCAP 136* 109* 139* 131* 124*    Studies/Results: Ir Fluoro Guide Cv Line  Left  10/16/2013   CLINICAL DATA:  END-STAGE RENAL DISEASE  EXAM: ULTRASOUND GUIDANCE FOR VASCULAR ACCESS  LEFT INTERNAL JUGULAR PERMANENT HEMODIALYSIS CATHETER  Date:  10/16/2013 12:33 PM12/12/2012 12:33 PM  Radiologist:  Judie Petit. Ruel Favors, MD  Guidance:  ULTRASOUND FLUOROSCOPIC  MEDICATIONS AND MEDICAL HISTORY: 2 G ANCEF ADMINISTERED WITHIN 1 HR OF THE PROCEDURE, 1.5 MG VERSED, 50 MCG FENTANYL  ANESTHESIA/SEDATION: 20 min  CONTRAST:  None.  FLUOROSCOPY TIME:  2 MIN 12 SECONDS  PROCEDURE: Informed consent was obtained from the patient following explanation of the procedure, risks, benefits and alternatives. The patient understands, agrees and consents for the procedure. All questions were addressed. A time out was performed.  Maximal barrier sterile technique utilized including caps, mask, sterile gowns, sterile gloves, large sterile drape, hand hygiene, and 2% chlorhexidine scrub.  Under sterile conditions and local anesthesia, left internal jugular micropuncture venous access was performed with ultrasound. Images were obtained for documentation. A guide wire was inserted followed by a transitional dilator. Next, a 0.035 guidewire was advanced into the IVC with a 5-French catheter. Measurements were obtained from the left venotomy site to the proximal right atrium. In the left infraclavicular chest, a subcutaneous tunnel was created under sterile conditions and local anesthesia. 1% lidocaine with epinephrine was utilized for this. The 27 cm tip to cuff hemo split catheter was tunneled subcutaneously to the venotomy site and inserted into the SVC/RA junction through a valved peel-away sheath. Position was confirmed with fluoroscopy. Images were obtained for documentation. Blood was aspirated from the catheter followed by saline and heparin flushes. The appropriate volume and strength of heparin was instilled in each lumen. Caps were applied. The catheter was secured at the tunnel site with Gelfoam and a pursestring  suture. The venotomy site was closed with subcuticular Vicryl suture. Dermabond was applied to the small right neck incision. A dry sterile dressing was applied. The catheter is ready for use. No immediate complications.  COMPLICATIONS: No immediate  IMPRESSION: Ultrasound and fluoroscopically guided left internal jugular tunneled hemodialysis catheter (27 cm tip to cuff hemo split catheter).   Electronically Signed   By: Ruel Favors M.D.   On: 10/16/2013 13:02   Ir US Guide Vasc Access Left  10/16/2013   CLINICAL DATA:  END-STAGE RENAL DISEASE  EXAM: ULTRASOUND GUIDANCE FOR VASCULAR ACCESS  LEFT INTERNAL JUGULAR PERMANENT HEMODIALYSIS CATHETER  Date:  10/16/2013 12:33 PM12/12/2012 12:33 PM  Radiologist:  Judie Petit. Ruel Favors, MD  Guidance:  ULTRASOUND FLUOROSCOPIC  MEDICATIONS AND  MEDICAL HISTORY: 2 G ANCEF ADMINISTERED WITHIN 1 HR OF THE PROCEDURE, 1.5 MG VERSED, 50 MCG FENTANYL  ANESTHESIA/SEDATION: 20 min  CONTRAST:  None.  FLUOROSCOPY TIME:  2 MIN 12 SECONDS  PROCEDURE: Informed consent was obtained from the patient following explanation of the procedure, risks, benefits and alternatives. The patient understands, agrees and consents for the procedure. All questions were addressed. A time out was performed.  Maximal barrier sterile technique utilized including caps, mask, sterile gowns, sterile gloves, large sterile drape, hand hygiene, and 2% chlorhexidine scrub.  Under sterile conditions and local anesthesia, left internal jugular micropuncture venous access was performed with ultrasound. Images were obtained for documentation. A guide wire was inserted followed by a transitional dilator. Next, a 0.035 guidewire was advanced into the IVC with a 5-French catheter. Measurements were obtained from the left venotomy site to the proximal right atrium. In the left infraclavicular chest, a subcutaneous tunnel was created under sterile conditions and local anesthesia. 1% lidocaine with epinephrine was utilized for this.  The 27 cm tip to cuff hemo split catheter was tunneled subcutaneously to the venotomy site and inserted into the SVC/RA junction through a valved peel-away sheath. Position was confirmed with fluoroscopy. Images were obtained for documentation. Blood was aspirated from the catheter followed by saline and heparin flushes. The appropriate volume and strength of heparin was instilled in each lumen. Caps were applied. The catheter was secured at the tunnel site with Gelfoam and a pursestring suture. The venotomy site was closed with subcuticular Vicryl suture. Dermabond was applied to the small right neck incision. A dry sterile dressing was applied. The catheter is ready for use. No immediate complications.  COMPLICATIONS: No immediate  IMPRESSION: Ultrasound and fluoroscopically guided left internal jugular tunneled hemodialysis catheter (27 cm tip to cuff hemo split catheter).   Electronically Signed   By: Ruel Favors M.D.   On: 10/16/2013 13:02   Medications:   . ALPRAZolam  0.25 mg Oral BID  . aspirin  81 mg Oral Daily  . atorvastatin  80 mg Oral q1800  . bisacodyl  10 mg Oral Daily  . carvedilol  25 mg Oral BID WC  . collagenase   Topical Daily  . darbepoetin (ARANESP) injection - DIALYSIS  60 mcg Intravenous Q Tue-HD  . docusate sodium  100 mg Oral Daily  . doxercalciferol  2 mcg Intravenous Q T,Th,Sa-HD  . fentaNYL      . gabapentin  300 mg Oral QHS  . gelatin adsorbable      . heparin      . insulin aspart  0-15 Units Subcutaneous TID WC  . insulin aspart  0-5 Units Subcutaneous QHS  . midazolam      . mycophenolate  500 mg Oral BID  . pantoprazole  40 mg Oral Daily  . predniSONE  5 mg Oral BID WC  . warfarin  7.5 mg Oral ONCE-1800  . Warfarin - Pharmacist Dosing Inpatient   Does not apply q1800   I  have reviewed scheduled and prn medications.   Camille Bal, MD [facility] [phone] pager 10/16/2013, 2:26 PM

## 2013-10-16 NOTE — Procedures (Signed)
Successful LT IJ TUNNELED HD CATH NO COMP STABLE READY FOR USE TIPS SVC/RA

## 2013-10-16 NOTE — Progress Notes (Signed)
Patient ID: [name], male   DOB: May 31, 1946, 67 y.o.   MRN: [id number]    SUBJECTIVE: Coumadin has been started for his atrial fibrillation. Hemoglobin remains slightly lower than yesterday  at 6.9. Post hospital the patient will follow with Dr. Lady Gary of [place] in [place].   Filed Vitals:   10/15/13 1651 10/15/13 1858 10/15/13 1921 10/16/13 0514  BP: 132/60 132/69 126/56 129/60  Pulse: 70 70 70 70  Temp: 98.4 F (36.9 C)  98.5 F (36.9 C) 98.3 F (36.8 C)  TempSrc: Oral  Oral Oral  Resp: 13  16 16   Height:   6\' 2"  (1.88 m)   Weight:   196 lb 3.4 oz (89 kg) 196 lb 3.4 oz (89 kg)  SpO2: 100%  100% 95%     Intake/Output Summary (Last 24 hours) at 10/16/13 0801 Last data filed at 10/16/13 0015  Gross per 24 hour  Intake     60 ml  Output    200 ml  Net   -140 ml    LABS: Basic Metabolic Panel:  Recent Labs  41/32/44 0600 10/16/13 0525  NA 135 136  K 3.9 4.3  CL 101 100  CO2 26 23  GLUCOSE 168* 153*  BUN 33* 38*  CREATININE 2.06* 2.50*  CALCIUM 7.9* 7.9*  PHOS 3.2 3.9   Liver Function Tests:  Recent Labs  10/15/13 0600 10/16/13 0525  ALBUMIN 2.2* 2.1*   No results found for this basename: LIPASE, AMYLASE,  in the last 72 hours CBC:  Recent Labs  10/15/13 0600 10/16/13 0525  WBC 9.3 6.9  HGB 7.2* 6.9*  HCT 22.9* 21.9*  MCV 94.6 95.6  PLT 126* 128*    Recent Labs  10/14/13 0600  TSH 1.385    RADIOLOGY: Dg Chest Port 1 View  10/10/2013   CLINICAL DATA:  Subxiphoid pericardial window procedure.  EXAM: PORTABLE CHEST - 1 VIEW  COMPARISON:  10/08/2013  FINDINGS: Right jugular central line in the lower SVC region. Again noted is enlargement of the cardiac silhouette. There is a drain overlying the inferior aspect of the cardiac silhouette. Stable position of the cardiac leads. Persistent densities at the lung bases could represent pleural effusions and atelectasis. No evidence for large pneumothorax or significant mediastinal air.  IMPRESSION:  Persistent basilar chest densities that may represent a combination of atelectasis and pleural fluid.  Stable enlargement of the cardiac silhouette.   Electronically Signed   By: Richarda Overlie M.D.   On: 10/10/2013 08:14   TELEMETRY:the patient is off telemetry    ASSESSMENT AND PLAN:  1. Acute on chronic systolic CHF (congestive heart failure). The patient's volume is now being controlled with dialysis. He is stable.  2. CAD (coronary artery disease). Coronary disease is stable.  3.  End-stage renal disease. He will be dialyzed going forward in the future.  4. Pericardial effusion. This is been treated with pericardial window this admission.  5. Anemia in chronic kidney disease. Hemoglobin remained low at 6.9  6. Cardiomyopathy, ischemic. The patient is on carvedilol.  7. Atrial fibrillation. Patient is anticoagulated. He is on carvedilol for rate control when he has bursts of atrial fibrillation.  8. Warfarin anticoagulation. Coumadin is now being used for his atrial fibrillation.   [name], H 10/16/2013 8:01 AM

## 2013-10-17 ENCOUNTER — Inpatient Hospital Stay (HOSPITAL_COMMUNITY)
Admission: RE | Admit: 2013-10-17 | Discharge: 2013-11-15 | DRG: 939 | Disposition: E | Payer: Medicare Other | Source: Intra-hospital | Attending: Physical Medicine & Rehabilitation | Admitting: Physical Medicine & Rehabilitation

## 2013-10-17 DIAGNOSIS — R5381 Other malaise: Secondary | ICD-10-CM | POA: Diagnosis present

## 2013-10-17 DIAGNOSIS — D631 Anemia in chronic kidney disease: Secondary | ICD-10-CM

## 2013-10-17 DIAGNOSIS — Z87891 Personal history of nicotine dependence: Secondary | ICD-10-CM

## 2013-10-17 DIAGNOSIS — L02419 Cutaneous abscess of limb, unspecified: Secondary | ICD-10-CM

## 2013-10-17 DIAGNOSIS — I959 Hypotension, unspecified: Secondary | ICD-10-CM

## 2013-10-17 DIAGNOSIS — E860 Dehydration: Secondary | ICD-10-CM

## 2013-10-17 DIAGNOSIS — I509 Heart failure, unspecified: Secondary | ICD-10-CM

## 2013-10-17 DIAGNOSIS — Z95810 Presence of automatic (implantable) cardiac defibrillator: Secondary | ICD-10-CM

## 2013-10-17 DIAGNOSIS — G589 Mononeuropathy, unspecified: Secondary | ICD-10-CM

## 2013-10-17 DIAGNOSIS — Z85828 Personal history of other malignant neoplasm of skin: Secondary | ICD-10-CM

## 2013-10-17 DIAGNOSIS — IMO0002 Reserved for concepts with insufficient information to code with codable children: Secondary | ICD-10-CM

## 2013-10-17 DIAGNOSIS — I120 Hypertensive chronic kidney disease with stage 5 chronic kidney disease or end stage renal disease: Secondary | ICD-10-CM

## 2013-10-17 DIAGNOSIS — Z7982 Long term (current) use of aspirin: Secondary | ICD-10-CM

## 2013-10-17 DIAGNOSIS — N2581 Secondary hyperparathyroidism of renal origin: Secondary | ICD-10-CM

## 2013-10-17 DIAGNOSIS — I252 Old myocardial infarction: Secondary | ICD-10-CM

## 2013-10-17 DIAGNOSIS — Z7901 Long term (current) use of anticoagulants: Secondary | ICD-10-CM

## 2013-10-17 DIAGNOSIS — F411 Generalized anxiety disorder: Secondary | ICD-10-CM

## 2013-10-17 DIAGNOSIS — I4891 Unspecified atrial fibrillation: Secondary | ICD-10-CM

## 2013-10-17 DIAGNOSIS — Y830 Surgical operation with transplant of whole organ as the cause of abnormal reaction of the patient, or of later complication, without mention of misadventure at the time of the procedure: Secondary | ICD-10-CM

## 2013-10-17 DIAGNOSIS — Z85820 Personal history of malignant melanoma of skin: Secondary | ICD-10-CM

## 2013-10-17 DIAGNOSIS — N186 End stage renal disease: Secondary | ICD-10-CM

## 2013-10-17 DIAGNOSIS — I2589 Other forms of chronic ischemic heart disease: Secondary | ICD-10-CM

## 2013-10-17 DIAGNOSIS — Z794 Long term (current) use of insulin: Secondary | ICD-10-CM

## 2013-10-17 DIAGNOSIS — I2510 Atherosclerotic heart disease of native coronary artery without angina pectoris: Secondary | ICD-10-CM

## 2013-10-17 DIAGNOSIS — T8610 Unspecified complication of kidney transplant: Secondary | ICD-10-CM

## 2013-10-17 DIAGNOSIS — Z9861 Coronary angioplasty status: Secondary | ICD-10-CM

## 2013-10-17 DIAGNOSIS — E119 Type 2 diabetes mellitus without complications: Secondary | ICD-10-CM

## 2013-10-17 DIAGNOSIS — G9341 Metabolic encephalopathy: Secondary | ICD-10-CM

## 2013-10-17 DIAGNOSIS — I2699 Other pulmonary embolism without acute cor pulmonale: Secondary | ICD-10-CM

## 2013-10-17 DIAGNOSIS — I5023 Acute on chronic systolic (congestive) heart failure: Secondary | ICD-10-CM

## 2013-10-17 DIAGNOSIS — Z79899 Other long term (current) drug therapy: Secondary | ICD-10-CM

## 2013-10-17 DIAGNOSIS — Z5189 Encounter for other specified aftercare: Principal | ICD-10-CM

## 2013-10-17 DIAGNOSIS — M216X9 Other acquired deformities of unspecified foot: Secondary | ICD-10-CM

## 2013-10-17 DIAGNOSIS — Z8551 Personal history of malignant neoplasm of bladder: Secondary | ICD-10-CM

## 2013-10-17 LAB — PROTIME-INR: Prothrombin Time: 16.5 seconds — ABNORMAL HIGH (ref 11.6–15.2)

## 2013-10-17 LAB — CBC
MCHC: 30.5 g/dL (ref 30.0–36.0)
MCV: 98.7 fL (ref 78.0–100.0)
Platelets: 154 10*3/uL (ref 150–400)
RBC: 2.39 MIL/uL — ABNORMAL LOW (ref 4.22–5.81)
RDW: 16.1 % — ABNORMAL HIGH (ref 11.5–15.5)
WBC: 8.1 10*3/uL (ref 4.0–10.5)

## 2013-10-17 LAB — RENAL FUNCTION PANEL
Albumin: 2.1 g/dL — ABNORMAL LOW (ref 3.5–5.2)
GFR calc non Af Amer: 36 mL/min — ABNORMAL LOW (ref >90)
Phosphorus: 2.9 mg/dL (ref 2.3–4.6)

## 2013-10-17 LAB — GLUCOSE, CAPILLARY
Glucose-Capillary: 105 mg/dL — ABNORMAL HIGH (ref 70–99)
Glucose-Capillary: 125 mg/dL — ABNORMAL HIGH (ref 70–99)
Glucose-Capillary: 136 mg/dL — ABNORMAL HIGH (ref 70–99)
Glucose-Capillary: 119 mg/dL — ABNORMAL HIGH (ref 70–99)

## 2013-10-17 MED ORDER — BISACODYL 10 MG RE SUPP: 10.0000 mg | Freq: Every day | RECTAL | Status: DC | PRN

## 2013-10-17 MED ORDER — CARVEDILOL 12.5 MG PO TABS: 12.5000 mg | ORAL_TABLET | Freq: Two times a day (BID) | ORAL | Status: DC

## 2013-10-17 MED ORDER — BISACODYL 5 MG PO TBEC: 10.0000 mg | DELAYED_RELEASE_TABLET | Freq: Every day | ORAL | Status: AC

## 2013-10-17 MED ORDER — ZOLPIDEM TARTRATE 5 MG PO TABS
5.0000 mg | ORAL_TABLET | Freq: Every evening | ORAL | Status: DC | PRN
  Administered 2013-10-17 – 2013-10-19 (×3): 5 mg via ORAL
  Filled 2013-10-17 (×3): qty 1

## 2013-10-17 MED ORDER — DIPHENHYDRAMINE HCL 12.5 MG/5ML PO ELIX: 12.5000 mg | ORAL_SOLUTION | Freq: Four times a day (QID) | ORAL | Status: DC | PRN

## 2013-10-17 MED ORDER — PANTOPRAZOLE SODIUM 40 MG PO TBEC
40.0000 mg | DELAYED_RELEASE_TABLET | Freq: Every day | ORAL | Status: DC
  Administered 2013-10-18: 40 mg via ORAL
  Filled 2013-10-17: qty 1

## 2013-10-17 MED ORDER — COLLAGENASE 250 UNIT/GM EX OINT
TOPICAL_OINTMENT | Freq: Every day | CUTANEOUS | Status: DC
  Administered 2013-10-18: 1 via TOPICAL
  Administered 2013-10-19: 09:00:00 via TOPICAL
  Filled 2013-10-17: qty 30

## 2013-10-17 MED ORDER — SODIUM CHLORIDE 0.9 % IJ SOLN
10.0000 mL | Freq: Two times a day (BID) | INTRAMUSCULAR | Status: DC
  Administered 2013-10-17: 20 mL

## 2013-10-17 MED ORDER — ZOLPIDEM TARTRATE 5 MG PO TABS: 5.0000 mg | ORAL_TABLET | Freq: Every evening | ORAL | Status: AC | PRN

## 2013-10-17 MED ORDER — GUAIFENESIN-DM 100-10 MG/5ML PO SYRP: 5.0000 mL | ORAL_SOLUTION | Freq: Four times a day (QID) | ORAL | Status: DC | PRN

## 2013-10-17 MED ORDER — DSS 100 MG PO CAPS: 100.0000 mg | ORAL_CAPSULE | Freq: Every day | ORAL | Status: AC

## 2013-10-17 MED ORDER — COLLAGENASE 250 UNIT/GM EX OINT: TOPICAL_OINTMENT | Freq: Every day | CUTANEOUS | Status: AC

## 2013-10-17 MED ORDER — DOXERCALCIFEROL 4 MCG/2ML IV SOLN
2.0000 ug | INTRAVENOUS | Status: DC
  Administered 2013-10-18: 2 ug via INTRAVENOUS
  Filled 2013-10-17 (×2): qty 2

## 2013-10-17 MED ORDER — TOBRAMYCIN 0.3 % OP SOLN: 1.0000 [drp] | OPHTHALMIC | Status: DC | PRN

## 2013-10-17 MED ORDER — SODIUM CHLORIDE 0.9 % IJ SOLN: 10.0000 mL | INTRAMUSCULAR | Status: DC | PRN

## 2013-10-17 MED ORDER — DOXERCALCIFEROL 4 MCG/2ML IV SOLN: 2.0000 ug | INTRAVENOUS | Status: AC

## 2013-10-17 MED ORDER — SENNOSIDES-DOCUSATE SODIUM 8.6-50 MG PO TABS: 1.0000 | ORAL_TABLET | Freq: Every evening | ORAL | Status: DC | PRN

## 2013-10-17 MED ORDER — SENNOSIDES-DOCUSATE SODIUM 8.6-50 MG PO TABS: 1.0000 | ORAL_TABLET | Freq: Every evening | ORAL | Status: AC | PRN

## 2013-10-17 MED ORDER — INSULIN ASPART 100 UNIT/ML ~~LOC~~ SOLN: 0.0000 [IU] | Freq: Every day | SUBCUTANEOUS | Status: DC

## 2013-10-17 MED ORDER — GABAPENTIN 300 MG PO CAPS
300.0000 mg | ORAL_CAPSULE | Freq: Every day | ORAL | Status: DC
  Administered 2013-10-17 – 2013-10-19 (×3): 300 mg via ORAL
  Filled 2013-10-17 (×4): qty 1

## 2013-10-17 MED ORDER — OXYCODONE-ACETAMINOPHEN 5-325 MG PO TABS
1.0000 | ORAL_TABLET | ORAL | Status: DC | PRN
  Administered 2013-10-17 – 2013-10-19 (×4): 2 via ORAL
  Administered 2013-10-19 (×2): 1 via ORAL
  Filled 2013-10-17: qty 1
  Filled 2013-10-17 (×4): qty 2
  Filled 2013-10-17: qty 1

## 2013-10-17 MED ORDER — ACETAMINOPHEN 325 MG PO TABS: 325.0000 mg | ORAL_TABLET | ORAL | Status: DC | PRN

## 2013-10-17 MED ORDER — ASPIRIN 81 MG PO CHEW
81.0000 mg | CHEWABLE_TABLET | Freq: Every day | ORAL | Status: DC
  Administered 2013-10-18: 81 mg via ORAL
  Filled 2013-10-17: qty 1

## 2013-10-17 MED ORDER — DOCUSATE SODIUM 100 MG PO CAPS
100.0000 mg | ORAL_CAPSULE | Freq: Every day | ORAL | Status: DC
  Administered 2013-10-18: 100 mg via ORAL
  Filled 2013-10-17 (×4): qty 1

## 2013-10-17 MED ORDER — HYDROCERIN EX CREA
TOPICAL_CREAM | Freq: Two times a day (BID) | CUTANEOUS | Status: DC
  Administered 2013-10-17 – 2013-10-18 (×3): via TOPICAL
  Administered 2013-10-19: 1 via TOPICAL
  Administered 2013-10-19: 09:00:00 via TOPICAL
  Filled 2013-10-17: qty 113

## 2013-10-17 MED ORDER — WARFARIN SODIUM 5 MG PO TABS
5.0000 mg | ORAL_TABLET | Freq: Once | ORAL | Status: DC
  Filled 2013-10-17: qty 1

## 2013-10-17 MED ORDER — ALPRAZOLAM 0.25 MG PO TABS
0.2500 mg | ORAL_TABLET | Freq: Two times a day (BID) | ORAL | Status: DC
  Administered 2013-10-17 – 2013-10-19 (×5): 0.25 mg via ORAL
  Filled 2013-10-17 (×5): qty 1

## 2013-10-17 MED ORDER — RENA-VITE PO TABS
1.0000 | ORAL_TABLET | Freq: Every day | ORAL | Status: DC
  Administered 2013-10-17 – 2013-10-19 (×3): 1 via ORAL
  Filled 2013-10-17 (×4): qty 1

## 2013-10-17 MED ORDER — FLEET ENEMA 7-19 GM/118ML RE ENEM: 1.0000 | ENEMA | Freq: Once | RECTAL | Status: AC | PRN

## 2013-10-17 MED ORDER — WARFARIN SODIUM 5 MG PO TABS: 5.0000 mg | ORAL_TABLET | Freq: Once | ORAL | Status: AC

## 2013-10-17 MED ORDER — INSULIN ASPART 100 UNIT/ML ~~LOC~~ SOLN
0.0000 [IU] | Freq: Three times a day (TID) | SUBCUTANEOUS | Status: DC
  Administered 2013-10-17 – 2013-10-19 (×2): 2 [IU] via SUBCUTANEOUS

## 2013-10-17 MED ORDER — ONDANSETRON HCL 4 MG/2ML IJ SOLN: 4.0000 mg | Freq: Four times a day (QID) | INTRAMUSCULAR | Status: AC | PRN

## 2013-10-17 MED ORDER — ATORVASTATIN CALCIUM 80 MG PO TABS
80.0000 mg | ORAL_TABLET | Freq: Every day | ORAL | Status: DC
  Administered 2013-10-17 – 2013-10-19 (×3): 80 mg via ORAL
  Filled 2013-10-17 (×4): qty 1

## 2013-10-17 MED ORDER — TRAZODONE HCL 50 MG PO TABS: 25.0000 mg | ORAL_TABLET | Freq: Every evening | ORAL | Status: DC | PRN

## 2013-10-17 MED ORDER — ONDANSETRON HCL 4 MG/2ML IJ SOLN
4.0000 mg | Freq: Four times a day (QID) | INTRAMUSCULAR | Status: DC | PRN
  Filled 2013-10-17: qty 2

## 2013-10-17 MED ORDER — DARBEPOETIN ALFA-POLYSORBATE 60 MCG/0.3ML IJ SOLN: 60.0000 ug | INTRAMUSCULAR | Status: DC

## 2013-10-17 MED ORDER — MYCOPHENOLATE MOFETIL 250 MG PO CAPS
250.0000 mg | ORAL_CAPSULE | Freq: Two times a day (BID) | ORAL | Status: DC
  Administered 2013-10-17 – 2013-10-19 (×5): 250 mg via ORAL
  Filled 2013-10-17 (×8): qty 1

## 2013-10-17 MED ORDER — RENA-VITE PO TABS
1.0000 | ORAL_TABLET | Freq: Every day | ORAL | Status: DC
  Filled 2013-10-17: qty 1

## 2013-10-17 MED ORDER — ONDANSETRON HCL 4 MG PO TABS: 4.0000 mg | ORAL_TABLET | Freq: Four times a day (QID) | ORAL | Status: DC | PRN

## 2013-10-17 MED ORDER — METHOCARBAMOL 500 MG PO TABS: 500.0000 mg | ORAL_TABLET | Freq: Four times a day (QID) | ORAL | Status: DC | PRN

## 2013-10-17 MED ORDER — PREDNISONE 5 MG PO TABS
5.0000 mg | ORAL_TABLET | Freq: Two times a day (BID) | ORAL | Status: DC
  Administered 2013-10-17 – 2013-10-19 (×5): 5 mg via ORAL
  Filled 2013-10-17 (×8): qty 1

## 2013-10-17 MED ORDER — ONDANSETRON HCL 4 MG/2ML IJ SOLN
4.0000 mg | Freq: Four times a day (QID) | INTRAMUSCULAR | Status: DC | PRN
  Administered 2013-10-19: 4 mg via INTRAVENOUS
  Filled 2013-10-17 (×2): qty 2

## 2013-10-17 MED ORDER — PANTOPRAZOLE SODIUM 40 MG PO TBEC: 40.0000 mg | DELAYED_RELEASE_TABLET | Freq: Every day | ORAL | Status: AC

## 2013-10-17 MED ORDER — SODIUM CHLORIDE 0.9 % IV BOLUS (SEPSIS)
125.0000 mL | Freq: Once | INTRAVENOUS | Status: DC
  Administered 2013-10-17: 125 mL via INTRAVENOUS

## 2013-10-17 MED ORDER — OXYCODONE HCL 5 MG PO TABS: 5.0000 mg | ORAL_TABLET | ORAL | Status: AC | PRN

## 2013-10-17 MED ORDER — DARBEPOETIN ALFA-POLYSORBATE 60 MCG/0.3ML IJ SOLN: 60.0000 ug | INTRAMUSCULAR | Status: AC

## 2013-10-17 MED ORDER — OXYCODONE HCL 5 MG PO TABS
5.0000 mg | ORAL_TABLET | ORAL | Status: DC | PRN
Start: 2013-10-17 — End: 2013-10-20
  Administered 2013-10-19 (×2): 10 mg via ORAL
  Filled 2013-10-17 (×2): qty 2

## 2013-10-17 NOTE — Progress Notes (Signed)
Report called to Kom,RN on rehab

## 2013-10-17 NOTE — H&P (Signed)
Physical Medicine and Rehabilitation Admission H&P  Chief Complaint   Patient presents with   .  Altered Mental Status   .  Deconditioning and complications due to failure of renal transplant   HPI: [name] is a 67 y.o. 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 He has multiple medical problems including coronary artery disease status post PTCA/ICD, CHF (EF 40-45%), hypertension, insulin-dependent diabetes and a history of basal cell cancer BLE as well as non healing wound LLE. He was admitted on 10/07/13 with increasing SOB, hypoglycemia, confusion with decrease in urine output as well as acute on chronic renal failure with BUN 112/ Cr-3.3. Dr. Tyrone Sage consulted for input on large pericardial effusion and surgical intervention recommended.Dr Allena Katz consulted for input and renal failure and felt that patient with uremia causing confusion and poor likelyhood or renal recovery. Patient underwent subxiphoid pericardial window and drainage of pericardial effusion on 10/09/13. Hemodialysis initiated 11/26 due to fluid overload with poor recovery and plans for AVG if he does not imporve over next 24-48 hours. IJ to be placed and plans for thigh AVG to be placed this Friday past check of ABI . He developed A fib and coumadin recommended by cardiology. Patient with CIR stay 04/2013 past lumbar surgery and would like to pursue CIR for progressive therapies prior to discharge to home. Therapies initiated and CIR recommended by PT/MD. .  Review of Systems  Constitutional: Positive for malaise/fatigue.  HENT: Negative for hearing loss.  Respiratory: Positive for shortness of breath. Negative for cough.  Cardiovascular: Negative for chest pain and palpitations.  Gastrointestinal: Positive  for nausea and constipation.  Skin:  Right shin ulcer worse today per wife.  Neurological: Positive for weakness. Negative for headaches.   Past Medical History   Diagnosis  Date   .  CAD (coronary artery disease)      a. 03/1990 - s/p MI and PTCA   .  Systolic congestive heart failure  01/2012     a. 09/2013 Echo: EF 40-45%, inf/infsept & post HK, sev LVH, mod reduced RV fxn, PASP , large pericardial eff - 39 mm in maximal dimension, no RV collapse, dilated IVC w/o resp variation.   .  Hypertension    .  Diabetes mellitus    .  ICD (implantable cardiac defibrillator) in place      a. BSX 02/2012, in setting of cardiac arrest.   .  History of pneumonia  2011   .  Arthritis    .  History of blood transfusion    .  CKD (chronic kidney disease), stage IV      a. H/O Bright's dzs, s/p cadaveric renal transplant 02/1973   .  Cancer      a. Bladder Cancer- 2004; b. Skin Cancer- Basal, Squamous, and a few melanoma   .  Right foot drop    .  Cellulitis    .  Pericardial effusion      a. 09/2013 Echo: large pericardial eff - 39 mm in maximal dimension, no RV collapse, dilated IVC w/o resp variation.    Past Surgical History   Procedure  Laterality  Date   .  Kidney transplant   02/13/1973   .  Coronary angioplasty   03/1990   .  Skin cancer  excision       Numerous   .  Eye surgery       Cataract Right Eye   .  Insert / replace / remove pacemaker     .  Uretheral transplation       Left to Right (transplanted kidney)   .  Skin transplant       Left thigh to Left hand   .  Knee arthroscopy   07/25/2012     Procedure: ARTHROSCOPY KNEE; Surgeon: Cammy Copa, MD; Location: [facility] OR; Service: Orthopedics; Laterality: Left; Left knee arthroscopy, debridement, cultures   .  Lumbar laminectomy/decompression microdiscectomy  Bilateral  03/26/2013     Procedure: LUMBAR LAMINECTOMY/DECOMPRESSION MICRODISCECTOMY 1 LEVEL; Surgeon: Reinaldo Meeker, MD; Location: [facility]; Service:  Neurosurgery; Laterality: Bilateral;   .  Lumbar laminectomy/decompression microdiscectomy  N/A  04/19/2013     Procedure: Redo Lumbar Three to Four Decompression One LEVEL; Surgeon: Cristi Loron, MD; Location: [facility]; Service: Neurosurgery; Laterality: N/A; Redo Lumbar Three to Four Decompression One LEVEL   .  Subxyphoid pericardial window  N/A  10/08/2013     Procedure: SUBXYPHOID PERICARDIAL WINDOW; Surgeon: Delight Ovens, MD; Location: [facility] OR; Service: Thoracic; Laterality: N/A;    Family History   Problem  Relation  Age of Onset   .  Diabetes Mellitus II  Mother    .  Lung cancer  Father     Social History: reports that he quit smoking about 30 years ago. He has never used smokeless tobacco. He reports that he does not drink alcohol or use illicit drugs.  Allergies: No Known Allergies  Medications Prior to Admission   Medication  Sig  Dispense  Refill   .  ALPRAZolam (XANAX) 0.25 MG tablet  Take 0.25 mg by mouth 2 (two) times daily.     Marland Kitchen  amLODipine (NORVASC) 5 MG tablet  Take 5 mg by mouth 2 (two) times daily.     Marland Kitchen  aspirin EC 81 MG tablet  Take 81 mg by mouth every evening.     Marland Kitchen  atorvastatin (LIPITOR) 80 MG tablet  Take 80 mg by mouth daily.     .  carvedilol (COREG) 12.5 MG tablet  Take 12.5 mg by mouth 2 (two) times daily with a meal.     .  ferrous sulfate 325 (65 FE) MG EC tablet  Take 1 tablet (325 mg total) by mouth 2 (two) times daily with a meal.  60 tablet  0   .  fish oil-omega-3 fatty acids 1000 MG capsule  Take 1 g by mouth daily.     .  furosemide (LASIX) 40 MG tablet  Take 2 tablets (80 mg total) by mouth daily as needed (if weight is above 173 lbs).  60 tablet  1   .  gabapentin (NEURONTIN) 300 MG capsule  Take 300 mg by mouth 2 (two) times daily.     Marland Kitchen  HYDROcodone-acetaminophen (NORCO) 10-325 MG per tablet  Take 1 tablet by mouth every 4 (four) hours as needed for moderate pain.     Marland Kitchen  insulin aspart (NOVOLOG) 100 UNIT/ML injection  Inject 6 Units  into the skin 3 (three) times daily before meals.     .  Insulin Detemir (LEVEMIR FLEXPEN) 100 UNIT/ML SOPN  Inject 40 Units into the skin every morning.     .  Multiple Vitamin (MULTIVITAMIN WITH MINERALS) TABS  Take 1 tablet by mouth daily.     Marland Kitchen  mycophenolate (CELLCEPT) 500 MG tablet  Take 500 mg by mouth 2 (two) times daily.     .  nitroGLYCERIN (NITROSTAT) 0.4 MG SL tablet  Place 0.4 mg under the tongue every 5 (five) minutes as needed for chest pain. x3 doses as needed for chest pain     .  predniSONE (DELTASONE) 5 MG tablet  Take 5 mg by mouth 2 (two) times daily.     .  primidone (MYSOLINE) 50 MG tablet  Take 50 mg by mouth at bedtime.     .  ranitidine (ZANTAC) 300 MG tablet  Take 300 mg by mouth at bedtime.     Marland Kitchen  tobramycin (TOBREX) 0.3 % ophthalmic solution  Place 1 drop into both eyes as needed (for allergies).     .  vitamin C (ASCORBIC ACID) 500 MG tablet  Take 500 mg by mouth daily.      Home:  Home Living  Family/patient expects to be discharged to:: Private residence  Living Arrangements: Spouse/significant other  Available Help at Discharge: Family;Available 24 hours/day  Type of Home: House  Home Access: Level entry  Home Layout: One level  Home Equipment: Walker - 2 wheels;Cane - single point;Wheelchair - manual;Shower seat;Grab bars - toilet;Grab bars - tub/shower;Shower seat - built in;Hand held shower head (handicapped toilet)  Additional Comments: Pt and wife built a fully handicap accessible house.  Lives With: Spouse  Functional History:   Functional Status:  Mobility:  Bed Mobility  Bed Mobility: Rolling Right;Right Sidelying to Sit;Sitting - Scoot to [place] of Bed  Rolling Right: 4: Min assist;With rail  Rolling Left: 4: Min assist;With rail  Right Sidelying to Sit: 3: Mod assist;With rails;HOB elevated  Left Sidelying to Sit: 3: Mod assist;With rails;HOB flat  Sitting - Scoot to Edge of Bed: 3: Mod assist  Transfers  Transfers: Sit to Stand;Stand to Sit   Sit to Stand: 3: Mod assist;From elevated surface;With upper extremity assist;From bed  Stand to Sit: 3: Mod assist;With upper extremity assist;To chair/3-in-1;With armrests  Stand Pivot Transfers: 3: Mod assist  Ambulation/Gait  Ambulation/Gait Assistance: 3: Mod assist  Ambulation Distance (Feet): 10 Feet (5 feet x 2)  Assistive device: Rolling walker  Ambulation/Gait Assistance Details: Pt with bil knee instability but was able to take a few steps forward and backward. No knee buckling noted but very shaky and weak. Was going to follow pt with chair therefore had +2 asssit but pt couldnt' ambulate far enough. Pt significantly fatigued at end of sesssion.  Gait Pattern: Step-to pattern;Decreased stride length;Wide base of support  Gait velocity: decreased  Stairs: No  Wheelchair Mobility  Wheelchair Mobility: No  ADL:   Cognition:  Cognition  Overall Cognitive Status: Within Functional Limits for tasks assessed  Orientation Level: Oriented X4  Cognition  Arousal/Alertness: Awake/alert  Behavior During Therapy: WFL for tasks assessed/performed  Overall Cognitive Status: Within Functional Limits for tasks assessed      Physical Exam:  Blood pressure 100/58, pulse 87, temperature 98.7 F (37.1 C), temperature source Oral, resp. rate 18, height 6\' 2"  (1.88 m), weight 82.7 kg (182 lb 5.1 oz), SpO2 95.00%.     Constitutional: He is oriented to person, place, and time. He appears well-developed and well-nourished. He has a sickly appearance.  Gen: more alert today HENT:  Head: Normocephalic and atraumatic.  Eyes: Conjunctivae are normal. Pupils are equal, round, and reactive to light.  exophthalmus Neck: Normal range of motion.  Cardiovascular: Normal rate and regular rhythm.  Respiratory: Breath sounds normal. He has no wheezes. He has no rales.  GI: Soft. Bowel sounds are normal. He exhibits no distension. There is no tenderness.  Musculoskeletal: He exhibits edema. 1+  LE. Large linear ulcer with yellow eschar--?abrasion on right shin that's sensitive to touch. Multiple dressings on skin-- left shin with small granulating wound--both of these with aqacel, absorbent dressing.  Bilateral forearms with open abraded ares which are minimal in size. Dry flaky skin with multiple keratoses.  Neurological: He is alert and oriented to person, place, and time. Strength 4/5 prox to distal UE's. 3/5 HF, 3+ KE and Left ADF and APF is 2-3/5 but somewhat limited by dressing and wounds. Right ankle with trace ADF and APF. Distal lower ext sensation slightly diminished. Overall seems to display reasonable insight and awareness. Cn exam intact .Psych: no distress. No anxiety  Results for orders placed during the hospital encounter of 10/07/13 (from the past 48 hour(s))   GLUCOSE, CAPILLARY Status: Abnormal    Collection Time    10/15/13 11:51 AM   Result  Value  Range    Glucose-Capillary  136 (*)  70 - 99 mg/dL   GLUCOSE, CAPILLARY Status: Abnormal    Collection Time    10/15/13 4:02 PM   Result  Value  Range    Glucose-Capillary  109 (*)  70 - 99 mg/dL   GLUCOSE, CAPILLARY Status: Abnormal    Collection Time    10/15/13 9:09 PM   Result  Value  Range    Glucose-Capillary  139 (*)  70 - 99 mg/dL   PROTIME-INR Status: Abnormal    Collection Time    10/16/13 5:25 AM   Result  Value  Range    Prothrombin Time  15.8 (*)  11.6 - 15.2 seconds    INR  1.29  0.00 - 1.49   CBC Status: Abnormal    Collection Time    10/16/13 5:25 AM   Result  Value  Range    WBC  6.9  4.0 - 10.5 K/uL    RBC  2.29 (*)  4.22 - 5.81 MIL/uL    Hemoglobin  6.9 (*)  13.0 - 17.0 g/dL    Comment:  REPEATED TO VERIFY     CRITICAL RESULT CALLED TO, READ BACK BY AND VERIFIED WITH:     S.WHITEHORN,RN 10/16/13 0621 BY BSLADE    HCT  21.9 (*)  39.0 - 52.0 %    MCV  95.6  78.0 - 100.0 fL    MCH  30.1  26.0 - 34.0 pg    MCHC  31.5  30.0 - 36.0 g/dL    RDW  16.1 (*)  09.6 - 15.5 %    Platelets  128  (*)  150 - 400 K/uL   RENAL FUNCTION PANEL Status: Abnormal    Collection Time    10/16/13 5:25 AM   Result  Value  Range    Sodium  136  135 - 145 mEq/L    Potassium  4.3  3.5 - 5.1 mEq/L    Chloride  100  96 - 112 mEq/L    CO2  23  19 - 32 mEq/L    Glucose, Bld  153 (*)  70 - 99 mg/dL    BUN  38 (*)  6 - 23 mg/dL    Creatinine, Ser  0.45 (*)  0.50 - 1.35 mg/dL    Calcium  7.9 (*)  8.4 - 10.5 mg/dL    Phosphorus  3.9  2.3 - 4.6 mg/dL    Albumin  2.1 (*)  3.5 - 5.2 g/dL    GFR calc non Af Amer  25 (*)  >90 mL/min    GFR calc Af Amer  29 (*)  >90 mL/min    Comment:  (NOTE)     The eGFR has been calculated using the CKD EPI equation.     This calculation has not been validated in all clinical situations.     eGFR's persistently <90 mL/min signify possible Chronic Kidney     Disease.   GLUCOSE, CAPILLARY Status: Abnormal    Collection Time    10/16/13 8:09 AM   Result  Value  Range    Glucose-Capillary  131 (*)  70 - 99 mg/dL   TYPE AND SCREEN Status: None    Collection Time    10/16/13 8:30 AM   Result  Value  Range    ABO/RH(D)  A POS     Antibody Screen  NEG     Sample Expiration  11/11/2013     Unit Number  [id number]     Blood Component Type  RED CELLS,LR     Unit division  00     Status of Unit  ALLOCATED     Transfusion Status  OK TO TRANSFUSE     Crossmatch Result  Compatible    PREPARE RBC (CROSSMATCH) Status: None    Collection Time    10/16/13 8:30 AM   Result  Value  Range    Order Confirmation  ORDER PROCESSED BY BLOOD BANK    GLUCOSE, CAPILLARY Status: Abnormal    Collection Time    10/16/13 12:47 PM   Result  Value  Range    Glucose-Capillary  124 (*)  70 - 99 mg/dL   GLUCOSE, CAPILLARY Status: Abnormal    Collection Time    10/16/13 10:02 PM   Result  Value  Range    Glucose-Capillary  139 (*)  70 - 99 mg/dL    Comment 1  Documented in Chart     Comment 2  Notify RN    RENAL FUNCTION PANEL Status: Abnormal    Collection Time    27-Oct-2013 4:35  AM   Result  Value  Range    Sodium  136  135 - 145 mEq/L    Potassium  3.5  3.5 - 5.1 mEq/L    Comment:  DELTA CHECK NOTED    Chloride  99  96 - 112 mEq/L    CO2  28  19 - 32 mEq/L    Glucose, Bld  113 (*)  70 - 99 mg/dL    BUN  18  6 - 23 mg/dL    Comment:  DELTA CHECK NOTED    Creatinine, Ser  1.88 (*)  0.50 - 1.35 mg/dL    Calcium  7.7 (*)  8.4 - 10.5 mg/dL    Phosphorus  2.9  2.3 - 4.6 mg/dL    Albumin  2.1 (*)  3.5 - 5.2 g/dL    GFR calc non Af Amer  36 (*)  >90 mL/min    GFR calc Af Amer  42 (*)  >90 mL/min    Comment:  (NOTE)     The eGFR has been calculated using the CKD EPI equation.     This calculation has not been validated in all clinical situations.     eGFR's persistently <90 mL/min signify possible Chronic Kidney     Disease.  PROTIME-INR Status: Abnormal    Collection Time    10/27/2013 5:15 AM   Result  Value  Range    Prothrombin Time  16.5 (*)  11.6 - 15.2 seconds    INR  1.37  0.00 - 1.49   CBC Status: Abnormal    Collection Time    10/31/2013 5:15 AM   Result  Value  Range    WBC  8.1  4.0 - 10.5 K/uL    RBC  2.39 (*)  4.22 - 5.81 MIL/uL    Hemoglobin  7.2 (*)  13.0 - 17.0 g/dL    HCT  14.7 (*)  82.9 - 52.0 %    MCV  98.7  78.0 - 100.0 fL    MCH  30.1  26.0 - 34.0 pg    MCHC  30.5  30.0 - 36.0 g/dL    RDW  56.2 (*)  13.0 - 15.5 %    Platelets  154  150 - 400 K/uL   GLUCOSE, CAPILLARY Status: Abnormal    Collection Time    11/06/2013 7:55 AM   Result  Value  Range    Glucose-Capillary  105 (*)  70 - 99 mg/dL    Ir Fluoro Guide Cv Line Left  10/16/2013 CLINICAL DATA: END-STAGE RENAL DISEASE EXAM: ULTRASOUND GUIDANCE FOR VASCULAR ACCESS LEFT INTERNAL JUGULAR PERMANENT HEMODIALYSIS CATHETER Date: 10/16/2013 12:33 PM12/12/2012 12:33 PM Radiologist: Judie Petit. Ruel Favors, MD Guidance: ULTRASOUND FLUOROSCOPIC MEDICATIONS AND MEDICAL HISTORY: 2 G ANCEF ADMINISTERED WITHIN 1 HR OF THE PROCEDURE, 1.5 MG VERSED, 50 MCG FENTANYL ANESTHESIA/SEDATION: 20 min CONTRAST: None.  FLUOROSCOPY TIME: 2 MIN 12 SECONDS PROCEDURE: Informed consent was obtained from the patient following explanation of the procedure, risks, benefits and alternatives. The patient understands, agrees and consents for the procedure. All questions were addressed. A time out was performed. Maximal barrier sterile technique utilized including caps, mask, sterile gowns, sterile gloves, large sterile drape, hand hygiene, and 2% chlorhexidine scrub. Under sterile conditions and local anesthesia, left internal jugular micropuncture venous access was performed with ultrasound. Images were obtained for documentation. A guide wire was inserted followed by a transitional dilator. Next, a 0.035 guidewire was advanced into the IVC with a 5-French catheter. Measurements were obtained from the left venotomy site to the proximal right atrium. In the left infraclavicular chest, a subcutaneous tunnel was created under sterile conditions and local anesthesia. 1% lidocaine with epinephrine was utilized for this. The 27 cm tip to cuff hemo split catheter was tunneled subcutaneously to the venotomy site and inserted into the SVC/RA junction through a valved peel-away sheath. Position was confirmed with fluoroscopy. Images were obtained for documentation. Blood was aspirated from the catheter followed by saline and heparin flushes. The appropriate volume and strength of heparin was instilled in each lumen. Caps were applied. The catheter was secured at the tunnel site with Gelfoam and a pursestring suture. The venotomy site was closed with subcuticular Vicryl suture. Dermabond was applied to the small right neck incision. A dry sterile dressing was applied. The catheter is ready for use. No immediate complications. COMPLICATIONS: No immediate IMPRESSION: Ultrasound and fluoroscopically guided left internal jugular tunneled hemodialysis catheter (27 cm tip to cuff hemo split catheter). Electronically Signed By: Ruel Favors M.D. On:  10/16/2013 13:02  Ir US Guide Vasc Access Left  10/16/2013 CLINICAL DATA: END-STAGE RENAL DISEASE EXAM: ULTRASOUND GUIDANCE FOR VASCULAR ACCESS LEFT INTERNAL JUGULAR PERMANENT HEMODIALYSIS CATHETER Date: 10/16/2013 12:33 PM12/12/2012 12:33 PM Radiologist: Judie Petit. Ruel Favors, MD Guidance: Margo Aye  MEDICATIONS AND MEDICAL HISTORY: 2 G ANCEF ADMINISTERED WITHIN 1 HR OF THE PROCEDURE, 1.5 MG VERSED, 50 MCG FENTANYL ANESTHESIA/SEDATION: 20 min CONTRAST: None. FLUOROSCOPY TIME: 2 MIN 12 SECONDS PROCEDURE: Informed consent was obtained from the patient following explanation of the procedure, risks, benefits and alternatives. The patient understands, agrees and consents for the procedure. All questions were addressed. A time out was performed. Maximal barrier sterile technique utilized including caps, mask, sterile gowns, sterile gloves, large sterile drape, hand hygiene, and 2% chlorhexidine scrub. Under sterile conditions and local anesthesia, left internal jugular micropuncture venous access was performed with ultrasound. Images were obtained for documentation. A guide wire was inserted followed by a transitional dilator. Next, a 0.035 guidewire was advanced into the IVC with a 5-French catheter. Measurements were obtained from the left venotomy site to the proximal right atrium. In the left infraclavicular chest, a subcutaneous tunnel was created under sterile conditions and local anesthesia. 1% lidocaine with epinephrine was utilized for this. The 27 cm tip to cuff hemo split catheter was tunneled subcutaneously to the venotomy site and inserted into the SVC/RA junction through a valved peel-away sheath. Position was confirmed with fluoroscopy. Images were obtained for documentation. Blood was aspirated from the catheter followed by saline and heparin flushes. The appropriate volume and strength of heparin was instilled in each lumen. Caps were applied. The catheter was secured at the tunnel site with  Gelfoam and a pursestring suture. The venotomy site was closed with subcuticular Vicryl suture. Dermabond was applied to the small right neck incision. A dry sterile dressing was applied. The catheter is ready for use. No immediate complications. COMPLICATIONS: No immediate IMPRESSION: Ultrasound and fluoroscopically guided left internal jugular tunneled hemodialysis catheter (27 cm tip to cuff hemo split catheter). Electronically Signed By: Ruel Favors M.D. On: 10/16/2013 13:02   Post Admission Physician Evaluation:  1. Functional deficits secondary to severe deconditioning related to multiple medical and endstage renal failure. 2. Patient is admitted to receive collaborative, interdisciplinary care between the physiatrist, rehab nursing staff, and therapy team. 3. Patient's level of medical complexity and substantial therapy needs in context of that medical necessity cannot be provided at a lesser intensity of care such as a SNF. 4. Patient has experienced substantial functional loss from his/her baseline which was documented above under the "Functional History" and "Functional Status" headings. Judging by the patient's diagnosis, physical exam, and functional history, the patient has potential for functional progress which will result in measurable gains while on inpatient rehab. These gains will be of substantial and practical use upon discharge in facilitating mobility and self-care at the household level. 5. Physiatrist will provide 24 hour management of medical needs as well as oversight of the therapy plan/treatment and provide guidance as appropriate regarding the interaction of the two. 6. 24 hour rehab nursing will assist with bladder management, bowel management, safety, skin/wound care, disease management, medication administration, pain management and patient education and help integrate therapy concepts, techniques,education, etc. 7. PT will assess and treat for/with: Lower extremity  strength, range of motion, stamina, balance, functional mobility, safety, adaptive techniques and equipment, pain mgt, activity tolerance,education. Goals are: supervision to min assist. 8. OT will assess and treat for/with: ADL's, functional mobility, safety, upper extremity strength, adaptive techniques and equipment, pain mgt, activity tolerance. Goals are: supervision to min assist. 9. SLP will assess and treat for/with: n/a. Goals are: n/a. 10. Case Management and Social Worker will assess and treat for psychological issues and discharge planning. 11. Team conference  will be held weekly to assess progress toward goals and to determine barriers to discharge. 12. Patient will receive at least 3 hours of therapy per day at least 5 days per week. 13. ELOS: 12-16 days  14. Prognosis: excellent Medical Problem List and Plan:  Metabolic encephalopathy with deconditioning  1. A fib/ DVT Prophylaxis/Anticoagulation: Pharmaceutical: Coumadin Monitor HR with bid checks. Continue coreg bid.  2. Pain Management: On neurontin for neuropathy.  3. Mood: continue xanax bid for chronic anxiety. Offer ego support.  4. Neuropsych: This patient is capable of making decisions on own behalf.  5. ESRD with failure of transplant: continue cellcept and prednisone. HD on T, TH, Sa--for AVG on Friday.  6. Hypotension: likely need medications adjusted. Given bolus prior to leaving acute today.   -volume mgt per nephrology 7. Multiple wounds: Will have WOC re-evaluate tomorrow. Add protein supplement to help promote healing.   -upper extremities are minimal and dressings can be downsized 8. CAD with ICM: continue coreg bid.  9. Acute on chronic CHF: managed with HD.  10. Pleural effusion: Treated with pericardial window. No complaints of dyspnea and SOB improving.  11. Anemia of chronic disease: Treated with IV iron. Continue aranesp weekly. Routine check in HD.   Ranelle Oyster, MD, [facility] Physical  Medicine & Rehabilitation   11/01/2013

## 2013-10-17 NOTE — Progress Notes (Signed)
CSW spoke with Pt about SNF workup as a backup. Pt stated that CIR spoke with him this morning and that he would be agreeable to go to CIR today.   CSW will notify CIR to see if that is still an option.   CSW will continue to follow Pt for d/c planning.    Leron Croak [facility]  [address] 1[address]16;  [phone] Phone: [phone]

## 2013-10-17 NOTE — Progress Notes (Signed)
[facility] ROUNDING NOTE   Subjective:  Still not sure of timing of thigh graft  ([facility] has seen - Dr. Arbie Cookey) Supposed to go to Inpt rehab today. Just had episode of hypotension into the 80's and is gettig a small IVF bolus. Has been nauseous off and on for the past 24 hours  Objective Vital signs in last 24 hours: Filed Vitals:   10/16/13 1730 10/16/13 1740 10/16/13 2121 11/07/2013 0628  BP: 132/75  108/53 100/58  Pulse: 72 72 76 87  Temp:  97.4 F (36.3 C) 98.9 F (37.2 C) 98.7 F (37.1 C)  TempSrc:  Oral Oral Oral  Resp:  18 18 18   Height:      Weight:  82.7 kg (182 lb 5.1 oz)    SpO2:  95% 95% 95%   Weight change: -2.3 kg (-5 lb 1.1 oz)  Intake/Output Summary (Last 24 hours) at 10/21/2013 1311 Last data filed at 10/23/2013 0831  Gross per 24 hour  Intake      0 ml  Output   2125 ml  Net  -2125 ml   Physical Exam:  BP 100/58  Pulse 87  Temp(Src) 98.7 F (37.1 C) (Oral)  Resp 18  Ht 6\' 2"  (1.88 m)  Wt 82.7 kg (182 lb 5.1 oz)  BMI 23.40 kg/m2  SpO2 95%  temperature 98.8 F (37.1 C), temperature source Oral, resp. rate 21, height 6\' 2"  (1.88 m), weight 84.4 kg (186 lb 1.1 oz), SpO2 95.00%. Gen: resting in bed NAD Skin covered in scaly lesions, hyperkeratotic skin lesions, scars from prior skin cancers and dressing over left lower tibial wound; skin tears arms Lines in both sides of his neck with left sided ICD in place  GNF:AOZHY RRR, normal S1 and S2  Resp:CTA bilaterally, no rales/rhonchi but breath sounds diminished at the bases  QMV:HQIO, NT, BS normal  Ext:1+ LE edema thighs  New left tunnelled catheter with dressing in place   Labs: Basic Metabolic Panel:  Recent Labs Lab 10/12/13 0600 10/12/13 1523 10/13/13 0700 10/14/13 0930 10/15/13 0600 10/16/13 0525 11/05/2013 0435  NA 132* 132* 139 135 135 136 136  K 4.0 4.0 3.0* 3.6 3.9 4.3 3.5  CL 100 99 107 100 101 100 99  CO2 19 19 20 26 26 23 28   GLUCOSE 128* 171* 96 198* 168* 153* 113*  BUN  91* 86* 47* 28* 33* 38* 18  CREATININE 2.87* 2.89* 1.88* 1.64* 2.06* 2.50* 1.88*  CALCIUM 8.1* 8.0* 6.9* 7.5* 7.9* 7.9* 7.7*  PHOS 4.0 3.8  --   --  3.2 3.9 2.9    Recent Labs Lab 10/15/13 0600 10/16/13 0525 11/01/2013 0435  ALBUMIN 2.2* 2.1* 2.1*    Recent Labs Lab 10/12/13 1523 10/15/13 0600 10/16/13 0525 10/26/2013 0515  WBC 8.3 9.3 6.9 8.1  HGB 7.2* 7.2* 6.9* 7.2*  HCT 21.7* 22.9* 21.9* 23.6*  MCV 91.9 94.6 95.6 98.7  PLT 95* 126* 128* 154   No results found for this basename: CKTOTAL, CKMB, CKMBINDEX, TROPONINI,  in the last 168 hours CBG:  Recent Labs Lab 10/16/13 0809 10/16/13 1247 10/16/13 2202 10/16/2013 0755 10/15/2013 1131  GLUCAP 131* 124* 139* 105* 125*    Studies/Results: Ir Fluoro Guide Cv Line Left  10/16/2013   CLINICAL DATA:  END-STAGE RENAL DISEASE  EXAM: ULTRASOUND GUIDANCE FOR VASCULAR ACCESS  LEFT INTERNAL JUGULAR PERMANENT HEMODIALYSIS CATHETER  Date:  10/16/2013 12:33 PM12/12/2012 12:33 PM  Radiologist:  Judie Petit. Ruel Favors, MD  Guidance:  Margo Aye  MEDICATIONS AND MEDICAL HISTORY: 2 G ANCEF ADMINISTERED WITHIN 1 HR OF THE PROCEDURE, 1.5 MG VERSED, 50 MCG FENTANYL  ANESTHESIA/SEDATION: 20 min  CONTRAST:  None.  FLUOROSCOPY TIME:  2 MIN 12 SECONDS  PROCEDURE: Informed consent was obtained from the patient following explanation of the procedure, risks, benefits and alternatives. The patient understands, agrees and consents for the procedure. All questions were addressed. A time out was performed.  Maximal barrier sterile technique utilized including caps, mask, sterile gowns, sterile gloves, large sterile drape, hand hygiene, and 2% chlorhexidine scrub.  Under sterile conditions and local anesthesia, left internal jugular micropuncture venous access was performed with ultrasound. Images were obtained for documentation. A guide wire was inserted followed by a transitional dilator. Next, a 0.035 guidewire was advanced into the IVC with a 5-French  catheter. Measurements were obtained from the left venotomy site to the proximal right atrium. In the left infraclavicular chest, a subcutaneous tunnel was created under sterile conditions and local anesthesia. 1% lidocaine with epinephrine was utilized for this. The 27 cm tip to cuff hemo split catheter was tunneled subcutaneously to the venotomy site and inserted into the SVC/RA junction through a valved peel-away sheath. Position was confirmed with fluoroscopy. Images were obtained for documentation. Blood was aspirated from the catheter followed by saline and heparin flushes. The appropriate volume and strength of heparin was instilled in each lumen. Caps were applied. The catheter was secured at the tunnel site with Gelfoam and a pursestring suture. The venotomy site was closed with subcuticular Vicryl suture. Dermabond was applied to the small right neck incision. A dry sterile dressing was applied. The catheter is ready for use. No immediate complications.  COMPLICATIONS: No immediate  IMPRESSION: Ultrasound and fluoroscopically guided left internal jugular tunneled hemodialysis catheter (27 cm tip to cuff hemo split catheter).   Electronically Signed   By: Ruel Favors M.D.   On: 10/16/2013 13:02   Ir US Guide Vasc Access Left  10/16/2013   CLINICAL DATA:  END-STAGE RENAL DISEASE  EXAM: ULTRASOUND GUIDANCE FOR VASCULAR ACCESS  LEFT INTERNAL JUGULAR PERMANENT HEMODIALYSIS CATHETER  Date:  10/16/2013 12:33 PM12/12/2012 12:33 PM  Radiologist:  Judie Petit. Ruel Favors, MD  Guidance:  ULTRASOUND FLUOROSCOPIC  MEDICATIONS AND MEDICAL HISTORY: 2 G ANCEF ADMINISTERED WITHIN 1 HR OF THE PROCEDURE, 1.5 MG VERSED, 50 MCG FENTANYL  ANESTHESIA/SEDATION: 20 min  CONTRAST:  None.  FLUOROSCOPY TIME:  2 MIN 12 SECONDS  PROCEDURE: Informed consent was obtained from the patient following explanation of the procedure, risks, benefits and alternatives. The patient understands, agrees and consents for the procedure. All questions were  addressed. A time out was performed.  Maximal barrier sterile technique utilized including caps, mask, sterile gowns, sterile gloves, large sterile drape, hand hygiene, and 2% chlorhexidine scrub.  Under sterile conditions and local anesthesia, left internal jugular micropuncture venous access was performed with ultrasound. Images were obtained for documentation. A guide wire was inserted followed by a transitional dilator. Next, a 0.035 guidewire was advanced into the IVC with a 5-French catheter. Measurements were obtained from the left venotomy site to the proximal right atrium. In the left infraclavicular chest, a subcutaneous tunnel was created under sterile conditions and local anesthesia. 1% lidocaine with epinephrine was utilized for this. The 27 cm tip to cuff hemo split catheter was tunneled subcutaneously to the venotomy site and inserted into the SVC/RA junction through a valved peel-away sheath. Position was confirmed with fluoroscopy. Images were obtained for documentation. Blood was aspirated from the  catheter followed by saline and heparin flushes. The appropriate volume and strength of heparin was instilled in each lumen. Caps were applied. The catheter was secured at the tunnel site with Gelfoam and a pursestring suture. The venotomy site was closed with subcuticular Vicryl suture. Dermabond was applied to the small right neck incision. A dry sterile dressing was applied. The catheter is ready for use. No immediate complications.  COMPLICATIONS: No immediate  IMPRESSION: Ultrasound and fluoroscopically guided left internal jugular tunneled hemodialysis catheter (27 cm tip to cuff hemo split catheter).   Electronically Signed   By: Ruel Favors M.D.   On: 10/16/2013 13:02   Medications:   . ALPRAZolam  0.25 mg Oral BID  . aspirin  81 mg Oral Daily  . atorvastatin  80 mg Oral q1800  . bisacodyl  10 mg Oral Daily  . carvedilol  25 mg Oral BID WC  . collagenase   Topical Daily  . darbepoetin  (ARANESP) injection - DIALYSIS  60 mcg Intravenous Q Tue-HD  . docusate sodium  100 mg Oral Daily  . doxercalciferol  2 mcg Intravenous Q T,Th,Sa-HD  . gabapentin  300 mg Oral QHS  . insulin aspart  0-15 Units Subcutaneous TID WC  . insulin aspart  0-5 Units Subcutaneous QHS  . mycophenolate  250 mg Oral BID  . pantoprazole  40 mg Oral Daily  . predniSONE  5 mg Oral BID WC  . warfarin  5 mg Oral ONCE-1800  . Warfarin - Pharmacist Dosing Inpatient   Does not apply q1800   I  have reviewed scheduled and prn medications.  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     Only option for permanent access is a thigh AVG- [facility] aware - Dr. Hart [place] is getting aortoilac femoral duplex and ABI's to see if good candidate for thigh AVG (arm AVG not an option d/t skin issues) Got tunnelled HD catheter 12/2   OP HD unit placement [facility] TTS Establishing EDW (still has edema)  Will need to start taper of cellcept - reduced to 250 BID (12/2) Prednisone will take [facility] longer (on this for over 40 years)  Hypotension BP's soft, but still with a lot of edema Decrease dose of carvedilol to 12.5 BID  Shortness of breath/pericardial effusion: s/p pericardial window/effusion drainage Cytology benign and likely from uremia.   Anemia: anemia secondary to chronic kidney disease.  On Aranesp 60/week and status post intravenous iron therapy   Secondary hyperpara  Phosphorus normal (no binders indicated at this time)  PTH 451.   Changed calcitriol to Hectorol with HD  Nutrition:  Low albumin noted and likely to be reflective of worsening chronic kidney disease.  Started on nutritional supplements  Ischemic cardiomyopathy  EF 40-45 on recent echo ICD in place On coumadin for  AFib  Dispo Inpatient rehab    Camille Bal, MD [facility] [phone] pager 11/01/2013, 1:11 PM

## 2013-10-17 NOTE — Progress Notes (Signed)
ANTICOAGULATION CONSULT NOTE   Pharmacy Consult for Coumadin Indication: atrial fibrillation  No Known Allergies  Labs:  Recent Labs  10/15/13 0600 10/16/13 0525 11/04/2013 0435 11/08/2013 0515  HGB 7.2* 6.9*  --  7.2*  HCT 22.9* 21.9*  --  23.6*  PLT 126* 128*  --  154  LABPROT 15.3* 15.8*  --  16.5*  INR 1.24 1.29  --  1.37  CREATININE 2.06* 2.50* 1.88*  --     Estimated Creatinine Clearance: 45.5 ml/min (by C-G formula based on Cr of 1.88).  Assessment: 67 y.o. male to begin coumadin for afib. INR today 1.37   Goal of Therapy:  INR 2-3 Monitor platelets by anticoagulation protocol: Yes   Plan:  1. Coumadin 5 mg po tonight 2.  Daily PT/INR  Thank you Talbert Cage, PharmD 10/16/2013,9:42 AM

## 2013-10-17 NOTE — Discharge Summary (Signed)
Physician Discharge Summary  [name] WUJ:[id number] DOB: December 21, 1945 DOA: 10/07/2013  PCP: [name], MD  Admit date: 10/07/2013 Discharge date: November 03, 2013  Time spent: 35 minutes  Recommendations for Outpatient Follow-up:  Patient be discharged to inpatient rehabilitation. He should continue following physical therapy and occupational therapy as outlined by the rehabilitation. Patient to continue taking his medications as prescribed. Patient went to follow up with his primary care physician within one week of discharge from inpatient rehabilitation. He should continue having his INR evaluate as well as a CBC.  Discharge Diagnoses:  Principal Problem:   Acute on chronic systolic CHF (congestive heart failure) Active Problems:   History of renal transplant   HTN (hypertension)   Diabetes mellitus type 2, controlled   CAD (coronary artery disease)   Renal failure (ARF), acute on chronic kidney disease stage 3   Dehydration   Acute respiratory failure with hypoxia   Pericardial effusion   Anemia in chronic kidney disease   Hypoglycemia   Cardiomyopathy, ischemic   Atrial fibrillation   Warfarin anticoagulation   Discharge Condition: Stable  Diet recommendation: Carb modified, Heart healthy  Filed Weights   10/16/13 0514 10/16/13 1325 10/16/13 1740  Weight: 89 kg (196 lb 3.4 oz) 86.7 kg (191 lb 2.2 oz) 82.7 kg (182 lb 5.1 oz)    History of present illness:  [name] is a 67 y.o. male has a past medical history of Myocardial infarction (04/12/1990); CHF (congestive heart failure) (01/2012); Hypertension; Diabetes mellitus; ICD (implantable cardiac defibrillator) in place; Pacemaker (02/15/2012); Pneumonia (2011); Arthritis; History of blood transfusion; Chronic kidney disease; Dysrhythmia; Cancer; Right foot drop; and Cellulitis.  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 Patient have had an MI in 2013.  He  have had recent laminectomy and hip surgeries. He have had trouble recovering since. Patient have head steady decline of his renal function since June 2014. He reports having low blood sugars today after not eating lunch patient is on Levemir and regular insulin for his diabetes. Patient have been on fluid restriction given worsening renal failure. He is also on Lasix 80 mg PO qd if weight >173. He has not taken lasix today. Denies any chest pain but is visibly severly short of breath. States this has been going on for the past few hours chest x-ray did not show evidence of significant pulmonary edema but he does have evidence of peripheral swelling. NO Hx of COPD  Of note patient had had persistent back pain neurosurgery has been consulted to emerge department and states will come and see him in a.m.  Patient has history of extensive skin cancers status post resections  Hospital Course:  This is a 67 year old male with a history of coronary disease, congestive heart failure, hypertension, diabetes, ICD placement, renal transplant 40 years ago that presented emergency department for hypoglycemia. Patient was admitted. He was found to have large pericardial effusion which is possibly some suspected secondary to his uremia. Patient did not have any tip not features. However he did undergo pericardial window on 10/09/2003. Patient also was noted to have a failing transplant and initiate dialysis 10/10/2013. Patient also had a dialysis catheter placed by the vascular team. Patient has had his renal transplants 1974 which was due to Bright disease. He is on immunosuppressants per nephrology. Patient also noted to have anemia which is secondary to his renal function. He was given 2 units of packed red blood cells. Patient  was also noted to have newly diagnosed atrial fibrillation for which cardiology was consulted. He was a placed on warfarin interrogation of his pacemaker.  Patient also has a history of skin cancer  and was noted to have several seborrhoic dermatitis noted. Wound care was also consulted and was following the patient.  Patient also has a history of CHF which point some have acute on chronic systolic CHF. He was found 18 pounds overweight during his admission. This was being treated by dialysis. As for his hypertension this remained stable. His diabetes mellitus also remained stable. Patient was noted to be severely deconditioned and could be admitted to inpatient rehabilitation.  Procedures: 11/24 TTE >>> Large pericardial effusion without tamponade, EF 40-45%  11/24 OR >>> Pericardial window  11/26 Left IJ trialysis cath placed by IR  Consultations: Nephrology T. CTS Vascular surgery Cardiology  Discharge Exam: Filed Vitals:   10/26/2013 0628  BP: 100/58  Pulse: 87  Temp: 98.7 F (37.1 C)  Resp: 18     General: Well developed, well nourished, NAD, appears stated age  HEENT: NCAT, PERRLA, EOMI, Anicteic Sclera, mucous membranes moist. No pharyngeal erythema or exudates  Neck: Supple, no JVD, no masses  Cardiovascular: S1 S2 auscultated, no rubs, murmurs or gallops. Regular rate and rhythm.  Respiratory: Clear to auscultation bilaterally with equal chest rise  Abdomen: Soft, nontender, nondistended, + bowel sounds  Extremities: warm dry without cyanosis clubbing or edema. Lower extremity wounds bilaterally, dressing in place dry and clean.  Neuro: AAOx3, cranial nerves grossly intact.   Skin: Without rashes exudates or nodules  Psych: Normal affect and demeanor with intact judgement and insight  Discharge Instructions  Discharge Orders   Future Orders Complete By Expires   Diet - low sodium heart healthy  As directed    Discharge instructions  As directed    Comments:     Patient be discharged to inpatient rehabilitation. He should continue following physical therapy and occupational therapy as outlined by the rehabilitation. Patient to continue taking his  medications as prescribed. Patient went to follow up with his primary care physician within one week of discharge from inpatient rehabilitation. He should continue having his INR evaluate as well as a CBC.   Increase activity slowly  As directed        Medication List         ALPRAZolam 0.25 MG tablet  Commonly known as:  XANAX  Take 0.25 mg by mouth 2 (two) times daily.     amLODipine 5 MG tablet  Commonly known as:  NORVASC  Take 5 mg by mouth 2 (two) times daily.     aspirin EC 81 MG tablet  Take 81 mg by mouth every evening.     atorvastatin 80 MG tablet  Commonly known as:  LIPITOR  Take 80 mg by mouth daily.     bisacodyl 5 MG EC tablet  Commonly known as:  DULCOLAX  Take 2 tablets (10 mg total) by mouth daily.     carvedilol 12.5 MG tablet  Commonly known as:  COREG  Take 12.5 mg by mouth 2 (two) times daily with a meal.     collagenase ointment  Commonly known as:  SANTYL  Apply topically daily.     darbepoetin 60 MCG/0.3ML Soln injection  Commonly known as:  ARANESP  Inject 0.3 mLs (60 mcg total) into the vein every Tuesday with hemodialysis.     doxercalciferol 4 MCG/2ML injection  Commonly known as:  HECTOROL  Inject 1 mL (2 mcg total) into the vein Every Tuesday,Thursday,and Saturday with dialysis.     DSS 100 MG Caps  Take 100 mg by mouth daily.     ferrous sulfate 325 (65 FE) MG EC tablet  Take 1 tablet (325 mg total) by mouth 2 (two) times daily with a meal.     fish oil-omega-3 fatty acids 1000 MG capsule  Take 1 g by mouth daily.     furosemide 40 MG tablet  Commonly known as:  LASIX  Take 2 tablets (80 mg total) by mouth daily as needed (if weight is above 173 lbs).     gabapentin 300 MG capsule  Commonly known as:  NEURONTIN  Take 300 mg by mouth 2 (two) times daily.     HYDROcodone-acetaminophen 10-325 MG per tablet  Commonly known as:  NORCO  Take 1 tablet by mouth every 4 (four) hours as needed for moderate pain.     insulin aspart  100 UNIT/ML injection  Commonly known as:  novoLOG  Inject 6 Units into the skin 3 (three) times daily before meals.     LEVEMIR FLEXPEN 100 UNIT/ML Sopn  Generic drug:  Insulin Detemir  Inject 40 Units into the skin every morning.     multivitamin with minerals Tabs tablet  Take 1 tablet by mouth daily.     mycophenolate 500 MG tablet  Commonly known as:  CELLCEPT  Take 500 mg by mouth 2 (two) times daily.     nitroGLYCERIN 0.4 MG SL tablet  Commonly known as:  NITROSTAT  Place 0.4 mg under the tongue every 5 (five) minutes as needed for chest pain. x3 doses as needed for chest pain     ondansetron 4 MG/2ML Soln injection  Commonly known as:  ZOFRAN  Inject 2 mLs (4 mg total) into the vein every 6 (six) hours as needed for nausea or vomiting.     oxyCODONE 5 MG immediate release tablet  Commonly known as:  Oxy IR/ROXICODONE  Take 1-2 tablets (5-10 mg total) by mouth every 4 (four) hours as needed for severe pain.     pantoprazole 40 MG tablet  Commonly known as:  PROTONIX  Take 1 tablet (40 mg total) by mouth daily.     predniSONE 5 MG tablet  Commonly known as:  DELTASONE  Take 5 mg by mouth 2 (two) times daily.     primidone 50 MG tablet  Commonly known as:  MYSOLINE  Take 50 mg by mouth at bedtime.     ranitidine 300 MG tablet  Commonly known as:  ZANTAC  Take 300 mg by mouth at bedtime.     senna-docusate 8.6-50 MG per tablet  Commonly known as:  Senokot-S  Take 1 tablet by mouth at bedtime as needed for moderate constipation.     tobramycin 0.3 % ophthalmic solution  Commonly known as:  TOBREX  Place 1 drop into both eyes as needed (for allergies).     vitamin C 500 MG tablet  Commonly known as:  ASCORBIC ACID  Take 500 mg by mouth daily.     warfarin 5 MG tablet  Commonly known as:  COUMADIN  Take 1 tablet (5 mg total) by mouth one time only at 6 PM.     zolpidem 5 MG tablet  Commonly known as:  AMBIEN  Take 1 tablet (5 mg total) by mouth at bedtime  as needed for sleep.       No Known Allergies  Follow-up Information   Follow up with [facility], MD. Schedule an appointment as soon as possible for a visit in 1 week. (After discharge from CIR.)    Specialty:  Internal Medicine   Contact information:   [address] [place] [address] [phone]        The results of significant diagnostics from this hospitalization (including imaging, microbiology, ancillary and laboratory) are listed below for reference.    Significant Diagnostic Studies: Dg Chest 2 View  10/07/2013   CLINICAL DATA:  Altered mental status  EXAM: CHEST  2 VIEW  COMPARISON:  04/11/2013  FINDINGS: Severe cardiac enlargement. This is stable. Pacer is unchanged. Lungs are clear.  IMPRESSION: No active cardiopulmonary disease. Stable severe cardiac enlargement.   Electronically Signed   By: Esperanza Heir M.D.   On: 10/07/2013 21:24   Ir Fluoro Guide Cv Line Left  10/16/2013   CLINICAL DATA:  END-STAGE RENAL DISEASE  EXAM: ULTRASOUND GUIDANCE FOR VASCULAR ACCESS  LEFT INTERNAL JUGULAR PERMANENT HEMODIALYSIS CATHETER  Date:  10/16/2013 12:33 PM12/12/2012 12:33 PM  Radiologist:  Judie Petit. Ruel Favors, MD  Guidance:  ULTRASOUND FLUOROSCOPIC  MEDICATIONS AND MEDICAL HISTORY: 2 G ANCEF ADMINISTERED WITHIN 1 HR OF THE PROCEDURE, 1.5 MG VERSED, 50 MCG FENTANYL  ANESTHESIA/SEDATION: 20 min  CONTRAST:  None.  FLUOROSCOPY TIME:  2 MIN 12 SECONDS  PROCEDURE: Informed consent was obtained from the patient following explanation of the procedure, risks, benefits and alternatives. The patient understands, agrees and consents for the procedure. All questions were addressed. A time out was performed.  Maximal barrier sterile technique utilized including caps, mask, sterile gowns, sterile gloves, large sterile drape, hand hygiene, and 2% chlorhexidine scrub.  Under sterile conditions and local anesthesia, left internal jugular micropuncture venous access was performed with ultrasound.  Images were obtained for documentation. A guide wire was inserted followed by a transitional dilator. Next, a 0.035 guidewire was advanced into the IVC with a 5-French catheter. Measurements were obtained from the left venotomy site to the proximal right atrium. In the left infraclavicular chest, a subcutaneous tunnel was created under sterile conditions and local anesthesia. 1% lidocaine with epinephrine was utilized for this. The 27 cm tip to cuff hemo split catheter was tunneled subcutaneously to the venotomy site and inserted into the SVC/RA junction through a valved peel-away sheath. Position was confirmed with fluoroscopy. Images were obtained for documentation. Blood was aspirated from the catheter followed by saline and heparin flushes. The appropriate volume and strength of heparin was instilled in each lumen. Caps were applied. The catheter was secured at the tunnel site with Gelfoam and a pursestring suture. The venotomy site was closed with subcuticular Vicryl suture. Dermabond was applied to the small right neck incision. A dry sterile dressing was applied. The catheter is ready for use. No immediate complications.  COMPLICATIONS: No immediate  IMPRESSION: Ultrasound and fluoroscopically guided left internal jugular tunneled hemodialysis catheter (27 cm tip to cuff hemo split catheter).   Electronically Signed   By: Ruel Favors M.D.   On: 10/16/2013 13:02   Ir Fluoro Guide Cv Line Left  10/10/2013   CLINICAL DATA:  Acute renal failure, heart failure, diabetes  EXAM: Ultrasound guidance for vascular access  Temporary left IJ TRIALYSIS catheter  Date:  10/10/2013 9:32 AM11/26/2014 9:32 AM  Radiologist:  Judie Petit. Ruel Favors, MD  Guidance:  ULTRASOUND AND FLUOROSCOPIC  MEDICATIONS AND MEDICAL HISTORY: 2 mg Versed, 100 mcg fentanyl  ANESTHESIA/SEDATION: 15 min  CONTRAST:  None.  FLUOROSCOPY TIME:  1 MIN  PROCEDURE: Informed consent was obtained from the patient following explanation of the procedure,  risks, benefits and alternatives. The patient understands, agrees and consents for the procedure. All questions were addressed. A time out was performed.  Maximal barrier sterile technique utilized including caps, mask, sterile gowns, sterile gloves, large sterile drape, hand hygiene, and ChloraPrep  Under sterile conditions and local anesthesia, left IJ micropuncture venous access was performed with ultrasound. Images obtained for documentation. Guidewire advanced centrally along the pacer wires. Transitional dilator inserted. Guidewire exchanged for an Amplatz guidewire. Dilatation performed to insert a 24 cm temporaryTrialysis catheter. Tips at the [facility] RA junction. Images obtained for documentation. Blood aspirated easily from all 3 lumens followed by saline and heparin flushes. External caps applied. Catheter secured with Prolene sutures. Sterile dressing applied. No immediate complication. Patient tolerated the procedure well.  COMPLICATIONS: No immediate  IMPRESSION: Successful ultrasound and fluoroscopic left IJ temporary Trialysis catheter. Tips SVC RA junction. Ready for use   Electronically Signed   By: Ruel Favors M.D.   On: 10/10/2013 09:44   Ir US Guide Vasc Access Left  10/16/2013   CLINICAL DATA:  END-STAGE RENAL DISEASE  EXAM: ULTRASOUND GUIDANCE FOR VASCULAR ACCESS  LEFT INTERNAL JUGULAR PERMANENT HEMODIALYSIS CATHETER  Date:  10/16/2013 12:33 PM12/12/2012 12:33 PM  Radiologist:  Judie Petit. Ruel Favors, MD  Guidance:  ULTRASOUND FLUOROSCOPIC  MEDICATIONS AND MEDICAL HISTORY: 2 G ANCEF ADMINISTERED WITHIN 1 HR OF THE PROCEDURE, 1.5 MG VERSED, 50 MCG FENTANYL  ANESTHESIA/SEDATION: 20 min  CONTRAST:  None.  FLUOROSCOPY TIME:  2 MIN 12 SECONDS  PROCEDURE: Informed consent was obtained from the patient following explanation of the procedure, risks, benefits and alternatives. The patient understands, agrees and consents for the procedure. All questions were addressed. A time out was performed.  Maximal  barrier sterile technique utilized including caps, mask, sterile gowns, sterile gloves, large sterile drape, hand hygiene, and 2% chlorhexidine scrub.  Under sterile conditions and local anesthesia, left internal jugular micropuncture venous access was performed with ultrasound. Images were obtained for documentation. A guide wire was inserted followed by a transitional dilator. Next, a 0.035 guidewire was advanced into the IVC with a 5-French catheter. Measurements were obtained from the left venotomy site to the proximal right atrium. In the left infraclavicular chest, a subcutaneous tunnel was created under sterile conditions and local anesthesia. 1% lidocaine with epinephrine was utilized for this. The 27 cm tip to cuff hemo split catheter was tunneled subcutaneously to the venotomy site and inserted into the SVC/RA junction through a valved peel-away sheath. Position was confirmed with fluoroscopy. Images were obtained for documentation. Blood was aspirated from the catheter followed by saline and heparin flushes. The appropriate volume and strength of heparin was instilled in each lumen. Caps were applied. The catheter was secured at the tunnel site with Gelfoam and a pursestring suture. The venotomy site was closed with subcuticular Vicryl suture. Dermabond was applied to the small right neck incision. A dry sterile dressing was applied. The catheter is ready for use. No immediate complications.  COMPLICATIONS: No immediate  IMPRESSION: Ultrasound and fluoroscopically guided left internal jugular tunneled hemodialysis catheter (27 cm tip to cuff hemo split catheter).   Electronically Signed   By: Ruel Favors M.D.   On: 10/16/2013 13:02   Ir US Guide Vasc Access Left  10/10/2013   CLINICAL DATA:  Acute renal failure, heart failure, diabetes  EXAM: Ultrasound guidance for vascular access  Temporary left IJ  TRIALYSIS catheter  Date:  10/10/2013 9:32 AM11/26/2014 9:32 AM  Radiologist:  Judie Petit. Ruel Favors, MD   Guidance:  ULTRASOUND AND FLUOROSCOPIC  MEDICATIONS AND MEDICAL HISTORY: 2 mg Versed, 100 mcg fentanyl  ANESTHESIA/SEDATION: 15 min  CONTRAST:  None.  FLUOROSCOPY TIME:  1 MIN  PROCEDURE: Informed consent was obtained from the patient following explanation of the procedure, risks, benefits and alternatives. The patient understands, agrees and consents for the procedure. All questions were addressed. A time out was performed.  Maximal barrier sterile technique utilized including caps, mask, sterile gowns, sterile gloves, large sterile drape, hand hygiene, and ChloraPrep  Under sterile conditions and local anesthesia, left IJ micropuncture venous access was performed with ultrasound. Images obtained for documentation. Guidewire advanced centrally along the pacer wires. Transitional dilator inserted. Guidewire exchanged for an Amplatz guidewire. Dilatation performed to insert a 24 cm temporaryTrialysis catheter. Tips at the [facility] RA junction. Images obtained for documentation. Blood aspirated easily from all 3 lumens followed by saline and heparin flushes. External caps applied. Catheter secured with Prolene sutures. Sterile dressing applied. No immediate complication. Patient tolerated the procedure well.  COMPLICATIONS: No immediate  IMPRESSION: Successful ultrasound and fluoroscopic left IJ temporary Trialysis catheter. Tips SVC RA junction. Ready for use   Electronically Signed   By: Ruel Favors M.D.   On: 10/10/2013 09:44   Dg Chest Port 1 View  10/13/2013   CLINICAL DATA:  Ventricular tachycardia.  EXAM: PORTABLE CHEST - 1 VIEW  COMPARISON:  10/12/2013.  FINDINGS: Right IJ line, dialysis catheter, cardiac pacer in stable position. Cardiomegaly. No pulmonary venous congestion. Persistent unchanged bibasilar atelectasis. No pneumothorax.  IMPRESSION: 1. Stable support lines.  Chest is unchanged from 10/12/2013. 2. Persistent bibasilar subsegmental atelectasis.   Electronically Signed   By: Maisie Fus  Register    On: 10/13/2013 07:56   Dg Chest Port 1 View  10/12/2013   CLINICAL DATA:  Pericardial effusion  EXAM: PORTABLE CHEST - 1 VIEW  COMPARISON:  10/10/2013  FINDINGS: Cardiac shadow remains enlarged. Bibasilar atelectatic changes are again noted. A right-sided central venous line is again identified. A pacing device and left-sided dialysis catheter are noted. The Trialysis Catheter tip is noted at the cavoatrial junction. No pneumothorax is seen. No other focal abnormality is noted.  IMPRESSION: New temporary dialysis catheter.  The remainder of the exam is stable.   Electronically Signed   By: Alcide Clever M.D.   On: 10/12/2013 07:49   Dg Chest Port 1 View  10/10/2013   CLINICAL DATA:  Subxiphoid pericardial window procedure.  EXAM: PORTABLE CHEST - 1 VIEW  COMPARISON:  10/08/2013  FINDINGS: Right jugular central line in the lower SVC region. Again noted is enlargement of the cardiac silhouette. There is a drain overlying the inferior aspect of the cardiac silhouette. Stable position of the cardiac leads. Persistent densities at the lung bases could represent pleural effusions and atelectasis. No evidence for large pneumothorax or significant mediastinal air.  IMPRESSION: Persistent basilar chest densities that may represent a combination of atelectasis and pleural fluid.  Stable enlargement of the cardiac silhouette.   Electronically Signed   By: Richarda Overlie M.D.   On: 10/10/2013 08:14   Dg Chest Portable 1 View  10/08/2013   CLINICAL DATA:  Postop for central line placement. Renal transplant.  EXAM: PORTABLE CHEST - 1 VIEW  COMPARISON:  10/07/2013  FINDINGS: Pacer/AICD device which is unchanged.  Right internal jugular line which terminates at the mid to low SVC.  Midline trachea.  Cardiomegaly accentuated by AP portable technique. Small left and possible small right pleural effusions. No pneumothorax. Diminished lung volumes. Development of mild pulmonary venous congestion. Bibasilar left greater than right  airspace disease. Similar, given differences in technique.  IMPRESSION: Right IJ central line terminating at mid to low SVC, without pneumothorax.  Worsened aeration, with development of mild pulmonary venous congestion and decreased lung volumes.  Similar small left and possible right pleural effusions with adjacent atelectasis or infection.   Electronically Signed   By: Jeronimo Greaves M.D.   On: 10/08/2013 17:50    Microbiology: Recent Results (from the past 240 hour(s))  MRSA PCR SCREENING     Status: None   Collection Time    10/08/13  2:45 AM      Result Value Range Status   MRSA by PCR NEGATIVE  NEGATIVE Final   Comment:            The GeneXpert MRSA Assay (FDA     approved for NASAL specimens     only), is one component of a     comprehensive MRSA colonization     surveillance program. It is not     intended to diagnose MRSA     infection nor to guide or     monitor treatment for     MRSA infections.  AFB CULTURE WITH SMEAR     Status: None   Collection Time    10/08/13  2:50 PM      Result Value Range Status   Specimen Description FLUID PERICARDIAL   Final   Special Requests NONE   Final   ACID FAST SMEAR     Final   Value: NO ACID FAST BACILLI SEEN     Performed at [facility]   Culture     Final   Value: CULTURE WILL BE EXAMINED FOR 6 WEEKS BEFORE ISSUING A FINAL REPORT     Performed at [facility]   Report Status PENDING   Incomplete  BODY FLUID CULTURE     Status: None   Collection Time    10/08/13  2:50 PM      Result Value Range Status   Specimen Description FLUID PERICARDIAL   Final   Special Requests NONE   Final   Gram Stain     Final   Value: RARE WBC PRESENT,BOTH PMN AND MONONUCLEAR     NO ORGANISMS SEEN     Performed at [facility]     Performed at [facility]   Culture     Final   Value: NO GROWTH 3 DAYS     Performed at [facility]   Report Status 10/11/2013 FINAL   Final  GRAM STAIN     Status: None    Collection Time    10/08/13  2:50 PM      Result Value Range Status   Specimen Description FLUID PERICARDIAL   Final   Special Requests NONE   Final   Gram Stain     Final   Value: RARE WBC PRESENT,BOTH PMN AND MONONUCLEAR     NO ORGANISMS SEEN   Report Status 10/08/2013 FINAL   Final     Labs: Basic Metabolic Panel:  Recent Labs Lab 10/12/13 0600 10/12/13 1523 10/13/13 0700 10/14/13 0930 10/15/13 0600 10/16/13 0525 10/19/2013 0435  NA 132* 132* 139 135 135 136 136  K 4.0 4.0 3.0* 3.6 3.9 4.3 3.5  CL 100 99 107 100 101 100  99  CO2 19 19 20 26 26 23 28   GLUCOSE 128* 171* 96 198* 168* 153* 113*  BUN 91* 86* 47* 28* 33* 38* 18  CREATININE 2.87* 2.89* 1.88* 1.64* 2.06* 2.50* 1.88*  CALCIUM 8.1* 8.0* 6.9* 7.5* 7.9* 7.9* 7.7*  MG 1.7  --  1.4*  --   --   --   --   PHOS 4.0 3.8  --   --  3.2 3.9 2.9   Liver Function Tests:  Recent Labs Lab 10/12/13 1523 10/15/13 0600 10/16/13 0525 10/18/2013 0435  ALBUMIN 2.0* 2.2* 2.1* 2.1*   No results found for this basename: LIPASE, AMYLASE,  in the last 168 hours No results found for this basename: AMMONIA,  in the last 168 hours CBC:  Recent Labs Lab 10/12/13 0600 10/12/13 1523 10/15/13 0600 10/16/13 0525 11/12/2013 0515  WBC 8.0 8.3 9.3 6.9 8.1  HGB 7.5* 7.2* 7.2* 6.9* 7.2*  HCT 22.8* 21.7* 22.9* 21.9* 23.6*  MCV 91.2 91.9 94.6 95.6 98.7  PLT 109* 95* 126* 128* 154   Cardiac Enzymes: No results found for this basename: CKTOTAL, CKMB, CKMBINDEX, TROPONINI,  in the last 168 hours BNP: BNP (last 3 results)  Recent Labs  04/09/13 1856 10/08/13 0825  PROBNP 21940.0* 10879.0*   CBG:  Recent Labs Lab 10/16/13 0809 10/16/13 1247 10/16/13 2202 11/09/2013 0755 10/19/2013 1131  GLUCAP 131* 124* 139* 105* 125*       Signed:  Edsel Petrin  [facility] 11/05/2013, 1:01 PM

## 2013-10-17 NOTE — Progress Notes (Signed)
*  PRELIMINARY RESULTS* Vascular Ultrasound Bilateral Lower Extremity Arterial Duplex has been completed.    Right: The external iliac, common femoral, profunda femoral, and femoral arteries exhibit monophasic flow. The distal popliteal, posterior tibial, and dorsalis pedis arteries exhibit dampened monophasic flow. The distal femoral, proximal popliteal, and peroneal arteries appear to be occluded. The proximal femoral artery has evidence of a >50% stenosis.  Left: The arteries of the left lower extremity are patent with triphasic flow throughout, without any obvious evidence of hemodynamically significant stenosis.  Unable to obtain ABIs due to ankle ulcerations and bandaging. Unable to adequately image the abdominal aorta and common iliac arteries due to overlying bowel gas.  Preliminary results discussed with Dr.Lawson.  11-12-13 5:55 PM Gertie Fey, RVT, RDCS, RDMS

## 2013-10-17 NOTE — Progress Notes (Signed)
Patient ID: [name], male   DOB: [birth year], 67 y.o.   MRN: [id number]    SUBJECTIVE:  Patient is resting comfortably.   Filed Vitals:   10/16/13 1730 10/16/13 1740 10/16/13 2121 October 18, 2013 0628  BP: 132/75  108/53 100/58  Pulse: 72 72 76 87  Temp:  97.4 F (36.3 C) 98.9 F (37.2 C) 98.7 F (37.1 C)  TempSrc:  Oral Oral Oral  Resp:  18 18 18   Height:      Weight:  182 lb 5.1 oz (82.7 kg)    SpO2:  95% 95% 95%     Intake/Output Summary (Last 24 hours) at October 18, 2013 0836 Last data filed at 18-Oct-2013 0831  Gross per 24 hour  Intake      0 ml  Output   2225 ml  Net  -2225 ml    LABS: Basic Metabolic Panel:  Recent Labs  40/98/11 0525 2013/10/18 0435  NA 136 136  K 4.3 3.5  CL 100 99  CO2 23 28  GLUCOSE 153* 113*  BUN 38* 18  CREATININE 2.50* 1.88*  CALCIUM 7.9* 7.7*  PHOS 3.9 2.9   Liver Function Tests:  Recent Labs  10/16/13 0525 2013/10/18 0435  ALBUMIN 2.1* 2.1*   No results found for this basename: LIPASE, AMYLASE,  in the last 72 hours CBC:  Recent Labs  10/16/13 0525 18-Oct-2013 0515  WBC 6.9 8.1  HGB 6.9* 7.2*  HCT 21.9* 23.6*  MCV 95.6 98.7  PLT 128* 154   Cardiac Enzymes: No results found for this basename: CKTOTAL, CKMB, CKMBINDEX, TROPONINI,  in the last 72 hours BNP: No components found with this basename: POCBNP,  D-Dimer: No results found for this basename: DDIMER,  in the last 72 hours Hemoglobin A1C: No results found for this basename: HGBA1C,  in the last 72 hours Fasting Lipid Panel: No results found for this basename: CHOL, HDL, LDLCALC, TRIG, CHOLHDL, LDLDIRECT,  in the last 72 hours Thyroid Function Tests: No results found for this basename: TSH, T4TOTAL, FREET3, T3FREE, THYROIDAB,  in the last 72 hours  RADIOLOGY: Dg Chest 2 View  10/07/2013   CLINICAL DATA:  Altered mental status  EXAM: CHEST  2 VIEW  COMPARISON:  04/11/2013  FINDINGS: Severe cardiac enlargement. This is stable. Pacer is unchanged. Lungs are clear.   IMPRESSION: No active cardiopulmonary disease. Stable severe cardiac enlargement.   Electronically Signed   By: Esperanza Heir M.D.   On: 10/07/2013 21:24   Ir Fluoro Guide Cv Line Left  10/16/2013   CLINICAL DATA:  END-STAGE RENAL DISEASE  EXAM: ULTRASOUND GUIDANCE FOR VASCULAR ACCESS  LEFT INTERNAL JUGULAR PERMANENT HEMODIALYSIS CATHETER  Date:  10/16/2013 12:33 PM12/12/2012 12:33 PM  Radiologist:  Judie Petit. Ruel Favors, MD  Guidance:  ULTRASOUND FLUOROSCOPIC  MEDICATIONS AND MEDICAL HISTORY: 2 G ANCEF ADMINISTERED WITHIN 1 HR OF THE PROCEDURE, 1.5 MG VERSED, 50 MCG FENTANYL  ANESTHESIA/SEDATION: 20 min  CONTRAST:  None.  FLUOROSCOPY TIME:  2 MIN 12 SECONDS  PROCEDURE: Informed consent was obtained from the patient following explanation of the procedure, risks, benefits and alternatives. The patient understands, agrees and consents for the procedure. All questions were addressed. A time out was performed.  Maximal barrier sterile technique utilized including caps, mask, sterile gowns, sterile gloves, large sterile drape, hand hygiene, and 2% chlorhexidine scrub.  Under sterile conditions and local anesthesia, left internal jugular micropuncture venous access was performed with ultrasound. Images were obtained for documentation. A guide wire was inserted followed by a  transitional dilator. Next, a 0.035 guidewire was advanced into the IVC with a 5-French catheter. Measurements were obtained from the left venotomy site to the proximal right atrium. In the left infraclavicular chest, a subcutaneous tunnel was created under sterile conditions and local anesthesia. 1% lidocaine with epinephrine was utilized for this. The 27 cm tip to cuff hemo split catheter was tunneled subcutaneously to the venotomy site and inserted into the SVC/RA junction through a valved peel-away sheath. Position was confirmed with fluoroscopy. Images were obtained for documentation. Blood was aspirated from the catheter followed by saline and  heparin flushes. The appropriate volume and strength of heparin was instilled in each lumen. Caps were applied. The catheter was secured at the tunnel site with Gelfoam and a pursestring suture. The venotomy site was closed with subcuticular Vicryl suture. Dermabond was applied to the small right neck incision. A dry sterile dressing was applied. The catheter is ready for use. No immediate complications.  COMPLICATIONS: No immediate  IMPRESSION: Ultrasound and fluoroscopically guided left internal jugular tunneled hemodialysis catheter (27 cm tip to cuff hemo split catheter).   Electronically Signed   By: Ruel Favors M.D.   On: 10/16/2013 13:02   Ir Fluoro Guide Cv Line Left  10/10/2013   CLINICAL DATA:  Acute renal failure, heart failure, diabetes  EXAM: Ultrasound guidance for vascular access  Temporary left IJ TRIALYSIS catheter  Date:  10/10/2013 9:32 AM11/26/2014 9:32 AM  Radiologist:  Judie Petit. Ruel Favors, MD  Guidance:  ULTRASOUND AND FLUOROSCOPIC  MEDICATIONS AND MEDICAL HISTORY: 2 mg Versed, 100 mcg fentanyl  ANESTHESIA/SEDATION: 15 min  CONTRAST:  None.  FLUOROSCOPY TIME:  1 MIN  PROCEDURE: Informed consent was obtained from the patient following explanation of the procedure, risks, benefits and alternatives. The patient understands, agrees and consents for the procedure. All questions were addressed. A time out was performed.  Maximal barrier sterile technique utilized including caps, mask, sterile gowns, sterile gloves, large sterile drape, hand hygiene, and ChloraPrep  Under sterile conditions and local anesthesia, left IJ micropuncture venous access was performed with ultrasound. Images obtained for documentation. Guidewire advanced centrally along the pacer wires. Transitional dilator inserted. Guidewire exchanged for an Amplatz guidewire. Dilatation performed to insert a 24 cm temporaryTrialysis catheter. Tips at the [facility] RA junction. Images obtained for documentation. Blood aspirated easily from all  3 lumens followed by saline and heparin flushes. External caps applied. Catheter secured with Prolene sutures. Sterile dressing applied. No immediate complication. Patient tolerated the procedure well.  COMPLICATIONS: No immediate  IMPRESSION: Successful ultrasound and fluoroscopic left IJ temporary Trialysis catheter. Tips SVC RA junction. Ready for use   Electronically Signed   By: Ruel Favors M.D.   On: 10/10/2013 09:44   Ir US Guide Vasc Access Left  10/16/2013   CLINICAL DATA:  END-STAGE RENAL DISEASE  EXAM: ULTRASOUND GUIDANCE FOR VASCULAR ACCESS  LEFT INTERNAL JUGULAR PERMANENT HEMODIALYSIS CATHETER  Date:  10/16/2013 12:33 PM12/12/2012 12:33 PM  Radiologist:  Judie Petit. Ruel Favors, MD  Guidance:  ULTRASOUND FLUOROSCOPIC  MEDICATIONS AND MEDICAL HISTORY: 2 G ANCEF ADMINISTERED WITHIN 1 HR OF THE PROCEDURE, 1.5 MG VERSED, 50 MCG FENTANYL  ANESTHESIA/SEDATION: 20 min  CONTRAST:  None.  FLUOROSCOPY TIME:  2 MIN 12 SECONDS  PROCEDURE: Informed consent was obtained from the patient following explanation of the procedure, risks, benefits and alternatives. The patient understands, agrees and consents for the procedure. All questions were addressed. A time out was performed.  Maximal barrier sterile technique utilized including caps, mask,  sterile gowns, sterile gloves, large sterile drape, hand hygiene, and 2% chlorhexidine scrub.  Under sterile conditions and local anesthesia, left internal jugular micropuncture venous access was performed with ultrasound. Images were obtained for documentation. A guide wire was inserted followed by a transitional dilator. Next, a 0.035 guidewire was advanced into the IVC with a 5-French catheter. Measurements were obtained from the left venotomy site to the proximal right atrium. In the left infraclavicular chest, a subcutaneous tunnel was created under sterile conditions and local anesthesia. 1% lidocaine with epinephrine was utilized for this. The 27 cm tip to cuff hemo split  catheter was tunneled subcutaneously to the venotomy site and inserted into the SVC/RA junction through a valved peel-away sheath. Position was confirmed with fluoroscopy. Images were obtained for documentation. Blood was aspirated from the catheter followed by saline and heparin flushes. The appropriate volume and strength of heparin was instilled in each lumen. Caps were applied. The catheter was secured at the tunnel site with Gelfoam and a pursestring suture. The venotomy site was closed with subcuticular Vicryl suture. Dermabond was applied to the small right neck incision. A dry sterile dressing was applied. The catheter is ready for use. No immediate complications.  COMPLICATIONS: No immediate  IMPRESSION: Ultrasound and fluoroscopically guided left internal jugular tunneled hemodialysis catheter (27 cm tip to cuff hemo split catheter).   Electronically Signed   By: Ruel Favors M.D.   On: 10/16/2013 13:02   Ir US Guide Vasc Access Left  10/10/2013   CLINICAL DATA:  Acute renal failure, heart failure, diabetes  EXAM: Ultrasound guidance for vascular access  Temporary left IJ TRIALYSIS catheter  Date:  10/10/2013 9:32 AM11/26/2014 9:32 AM  Radiologist:  Judie Petit. Ruel Favors, MD  Guidance:  ULTRASOUND AND FLUOROSCOPIC  MEDICATIONS AND MEDICAL HISTORY: 2 mg Versed, 100 mcg fentanyl  ANESTHESIA/SEDATION: 15 min  CONTRAST:  None.  FLUOROSCOPY TIME:  1 MIN  PROCEDURE: Informed consent was obtained from the patient following explanation of the procedure, risks, benefits and alternatives. The patient understands, agrees and consents for the procedure. All questions were addressed. A time out was performed.  Maximal barrier sterile technique utilized including caps, mask, sterile gowns, sterile gloves, large sterile drape, hand hygiene, and ChloraPrep  Under sterile conditions and local anesthesia, left IJ micropuncture venous access was performed with ultrasound. Images obtained for documentation. Guidewire advanced  centrally along the pacer wires. Transitional dilator inserted. Guidewire exchanged for an Amplatz guidewire. Dilatation performed to insert a 24 cm temporaryTrialysis catheter. Tips at the [facility] RA junction. Images obtained for documentation. Blood aspirated easily from all 3 lumens followed by saline and heparin flushes. External caps applied. Catheter secured with Prolene sutures. Sterile dressing applied. No immediate complication. Patient tolerated the procedure well.  COMPLICATIONS: No immediate  IMPRESSION: Successful ultrasound and fluoroscopic left IJ temporary Trialysis catheter. Tips SVC RA junction. Ready for use   Electronically Signed   By: Ruel Favors M.D.   On: 10/10/2013 09:44   Dg Chest Port 1 View  10/13/2013   CLINICAL DATA:  Ventricular tachycardia.  EXAM: PORTABLE CHEST - 1 VIEW  COMPARISON:  10/12/2013.  FINDINGS: Right IJ line, dialysis catheter, cardiac pacer in stable position. Cardiomegaly. No pulmonary venous congestion. Persistent unchanged bibasilar atelectasis. No pneumothorax.  IMPRESSION: 1. Stable support lines.  Chest is unchanged from 10/12/2013. 2. Persistent bibasilar subsegmental atelectasis.   Electronically Signed   By: Maisie Fus  Register   On: 10/13/2013 07:56   Dg Chest Port 1 View  10/12/2013  CLINICAL DATA:  Pericardial effusion  EXAM: PORTABLE CHEST - 1 VIEW  COMPARISON:  10/10/2013  FINDINGS: Cardiac shadow remains enlarged. Bibasilar atelectatic changes are again noted. A right-sided central venous line is again identified. A pacing device and left-sided dialysis catheter are noted. The Trialysis Catheter tip is noted at the cavoatrial junction. No pneumothorax is seen. No other focal abnormality is noted.  IMPRESSION: New temporary dialysis catheter.  The remainder of the exam is stable.   Electronically Signed   By: Alcide Clever M.D.   On: 10/12/2013 07:49   Dg Chest Port 1 View  10/10/2013   CLINICAL DATA:  Subxiphoid pericardial window procedure.  EXAM:  PORTABLE CHEST - 1 VIEW  COMPARISON:  10/08/2013  FINDINGS: Right jugular central line in the lower SVC region. Again noted is enlargement of the cardiac silhouette. There is a drain overlying the inferior aspect of the cardiac silhouette. Stable position of the cardiac leads. Persistent densities at the lung bases could represent pleural effusions and atelectasis. No evidence for large pneumothorax or significant mediastinal air.  IMPRESSION: Persistent basilar chest densities that may represent a combination of atelectasis and pleural fluid.  Stable enlargement of the cardiac silhouette.   Electronically Signed   By: Richarda Overlie M.D.   On: 10/10/2013 08:14   Dg Chest Portable 1 View  10/08/2013   CLINICAL DATA:  Postop for central line placement. Renal transplant.  EXAM: PORTABLE CHEST - 1 VIEW  COMPARISON:  10/07/2013  FINDINGS: Pacer/AICD device which is unchanged.  Right internal jugular line which terminates at the mid to low SVC.  Midline trachea. Cardiomegaly accentuated by AP portable technique. Small left and possible small right pleural effusions. No pneumothorax. Diminished lung volumes. Development of mild pulmonary venous congestion. Bibasilar left greater than right airspace disease. Similar, given differences in technique.  IMPRESSION: Right IJ central line terminating at mid to low SVC, without pneumothorax.  Worsened aeration, with development of mild pulmonary venous congestion and decreased lung volumes.  Similar small left and possible right pleural effusions with adjacent atelectasis or infection.   Electronically Signed   By: Jeronimo Greaves M.D.   On: 10/08/2013 17:50    TELEMETRY:    Patient is no longer on telemetry.   ASSESSMENT AND PLAN:    Acute on chronic systolic CHF (congestive heart failure)     Volume is now being managed with dialysis.    CAD (coronary artery disease)    Coronary disease is stable.    Pericardial effusion     Patient has a pericardial window and  place.    Cardiomyopathy, ischemic    He is on a beta blocker.    Atrial fibrillation      The rate appears to be controlled by the vital signs are being obtained.    Warfarin anticoagulation     Coumadin has been started.  The patient is followed by Dr. Lady Gary of the [facility] in [place]. His overall cardiac status is now stable. Cardiology will sign off.   Willa Rough 10/24/2013 8:36 AM

## 2013-10-17 NOTE — Progress Notes (Signed)
Patient complains of nausea this morning since last night. States he has had two injections for relief. Last BM he reports two days ago and had numerous BMs then secondary to a laxative. I will follow up with pt around lunch to determine if pt ready to admit to inpt rehab. I have discussed with Dr. Catha Gosselin. [phone]

## 2013-10-17 NOTE — Progress Notes (Signed)
Physical Therapy Treatment Patient Details Name: [name] MRN: [id number] DOB: 1946/06/25 Today's Date: 10/18/2013 Time: 4010-2725 PT Time Calculation (min): 19 min  PT Assessment / Plan / Recommendation  History of Present Illness Patient is a 67 yo male admitted with hypoglycemia and pericardial window.  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   PT Comments   Patient with some nausea today, assist patient OOB to chair. Patient tolerated well despite nausea. Some EOB activities performed. Will continue to see and progress as tolerated.  Follow Up Recommendations  CIR;Supervision/Assistance - 24 hour     Does the patient have the potential to tolerate intense rehabilitation     Barriers to Discharge        Equipment Recommendations  None recommended by PT    Recommendations for Other Services Rehab consult  Frequency Min 3X/week   Progress towards PT Goals Progress towards PT goals: Progressing toward goals  Plan Current plan remains appropriate    Precautions / Restrictions Precautions Precautions: Fall Restrictions Weight Bearing Restrictions: No   Pertinent Vitals/Pain 6/10    Mobility  Bed Mobility Bed Mobility: Rolling Right;Right Sidelying to Sit;Sitting - Scoot to [place] of Bed Rolling Right: 4: Min assist;With rail Right Sidelying to Sit: 3: Mod assist;With rails;HOB elevated Sitting - Scoot to Edge of Bed: 3: Mod assist Details for Bed Mobility Assistance: Pt needed assist for LEs and elevation of trunk. Transfers Transfers: Sit to Stand;Stand to Sit Sit to Stand: 3: Mod assist;From elevated surface;With upper extremity assist;From bed Stand to Sit: 3: Mod assist;With upper extremity assist;To chair/3-in-1;With armrests Details for Transfer Assistance: Pt needed cues for hand placement.  Raised bed fairly high so pt could stand from bed.   Ambulation/Gait Ambulation/Gait Assistance: 3: Mod  assist Ambulation Distance (Feet): 6 Feet Assistive device: Rolling walker Ambulation/Gait Assistance Details: Assist for stability, ambulated and turned to sit in chair for OOB. Gait Pattern: Step-to pattern;Decreased stride length;Wide base of support Gait velocity: decreased Wheelchair Mobility Wheelchair Mobility: No    Exercises General Exercises - Lower Extremity Ankle Circles/Pumps: AROM;Both;5 reps;Supine Heel Slides: AAROM;Both;5 reps;Supine     PT Goals (current goals can now be found in the care plan section) Acute Rehab PT Goals PT Goal Formulation: With patient Time For Goal Achievement: 11/09/2013 Potential to Achieve Goals: Good  Visit Information  Last PT Received On: 11/05/2013 Assistance Needed: +2 History of Present Illness: Patient is a 67 yo male admitted with hypoglycemia and pericardial window.  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    Subjective Data  Subjective: I have been very nauseated today   Cognition  Cognition Arousal/Alertness: Awake/alert Behavior During Therapy: WFL for tasks assessed/performed Overall Cognitive Status: Within Functional Limits for tasks assessed    Balance  Balance Balance Assessed: Yes Static Sitting Balance Static Sitting - Balance Support: Bilateral upper extremity supported;Feet supported Static Sitting - Level of Assistance: 6: Modified independent (Device/Increase time) Static Sitting - Comment/# of Minutes: 4 minutes EOB, nauseated with activity Static Standing Balance Static Standing - Balance Support: Bilateral upper extremity supported;During functional activity Static Standing - Level of Assistance: 4: Min assist  End of Session PT - End of Session Equipment Utilized During Treatment: Gait belt Activity Tolerance: Patient limited by fatigue Patient left: in chair;with call bell/phone within reach;with family/visitor present Nurse  Communication: Mobility status;Need for lift equipment   GP  Fabio Asa 11/02/2013, 1:33 PM Charlotte Crumb, PT DPT  [phone]

## 2013-10-17 NOTE — Progress Notes (Signed)
Pt was admitted to 4 W 26 from 6N16. Vital sign are stable for now and pt is with his wife.

## 2013-10-17 NOTE — Progress Notes (Signed)
Patient ID: [name], male   DOB: 08/27/1946, 67 y.o.   MRN: [id number] Vascular Surgery Progress Note  Subjective: Needs vascular access  Objective:  Filed Vitals:   11/05/2013 0628  BP: 100/58  Pulse: 87  Temp: 98.7 F (37.1 C)  Resp: 18    Femoral pulses are not readily palpable Both feet appear adequately perfused Does have chronic ulcerations in both pretibial regions   Labs:  Recent Labs Lab 10/15/13 0600 10/16/13 0525 11/14/2013 0435  CREATININE 2.06* 2.50* 1.88*    Recent Labs Lab 10/15/13 0600 10/16/13 0525 10/24/2013 0435  NA 135 136 136  K 3.9 4.3 3.5  CL 101 100 99  CO2 26 23 28   BUN 33* 38* 18  CREATININE 2.06* 2.50* 1.88*  GLUCOSE 168* 153* 113*  CALCIUM 7.9* 7.9* 7.7*    Recent Labs Lab 10/15/13 0600 10/16/13 0525 11/03/2013 0515  WBC 9.3 6.9 8.1  HGB 7.2* 6.9* 7.2*  HCT 22.9* 21.9* 23.6*  PLT 126* 128* 154    Recent Labs Lab 10/15/13 0600 10/16/13 0525 10/19/2013 0515  INR 1.24 1.29 1.37    I/O last 3 completed shifts: In: -  Out: 2300 [Urine:300; Other:2000]  Imaging: Ir Fluoro Guide Cv Line Left  10/16/2013   CLINICAL DATA:  END-STAGE RENAL DISEASE  EXAM: ULTRASOUND GUIDANCE FOR VASCULAR ACCESS  LEFT INTERNAL JUGULAR PERMANENT HEMODIALYSIS CATHETER  Date:  10/16/2013 12:33 PM12/12/2012 12:33 PM  Radiologist:  Judie Petit. Ruel Favors, MD  Guidance:  ULTRASOUND FLUOROSCOPIC  MEDICATIONS AND MEDICAL HISTORY: 2 G ANCEF ADMINISTERED WITHIN 1 HR OF THE PROCEDURE, 1.5 MG VERSED, 50 MCG FENTANYL  ANESTHESIA/SEDATION: 20 min  CONTRAST:  None.  FLUOROSCOPY TIME:  2 MIN 12 SECONDS  PROCEDURE: Informed consent was obtained from the patient following explanation of the procedure, risks, benefits and alternatives. The patient understands, agrees and consents for the procedure. All questions were addressed. A time out was performed.  Maximal barrier sterile technique utilized including caps, mask, sterile gowns, sterile gloves, large sterile drape, hand  hygiene, and 2% chlorhexidine scrub.  Under sterile conditions and local anesthesia, left internal jugular micropuncture venous access was performed with ultrasound. Images were obtained for documentation. A guide wire was inserted followed by a transitional dilator. Next, a 0.035 guidewire was advanced into the IVC with a 5-French catheter. Measurements were obtained from the left venotomy site to the proximal right atrium. In the left infraclavicular chest, a subcutaneous tunnel was created under sterile conditions and local anesthesia. 1% lidocaine with epinephrine was utilized for this. The 27 cm tip to cuff hemo split catheter was tunneled subcutaneously to the venotomy site and inserted into the SVC/RA junction through a valved peel-away sheath. Position was confirmed with fluoroscopy. Images were obtained for documentation. Blood was aspirated from the catheter followed by saline and heparin flushes. The appropriate volume and strength of heparin was instilled in each lumen. Caps were applied. The catheter was secured at the tunnel site with Gelfoam and a pursestring suture. The venotomy site was closed with subcuticular Vicryl suture. Dermabond was applied to the small right neck incision. A dry sterile dressing was applied. The catheter is ready for use. No immediate complications.  COMPLICATIONS: No immediate  IMPRESSION: Ultrasound and fluoroscopically guided left internal jugular tunneled hemodialysis catheter (27 cm tip to cuff hemo split catheter).   Electronically Signed   By: Ruel Favors M.D.   On: 10/16/2013 13:02   Ir US Guide Vasc Access Left  10/16/2013   CLINICAL DATA:  END-STAGE RENAL DISEASE  EXAM: ULTRASOUND GUIDANCE FOR VASCULAR ACCESS  LEFT INTERNAL JUGULAR PERMANENT HEMODIALYSIS CATHETER  Date:  10/16/2013 12:33 PM12/12/2012 12:33 PM  Radiologist:  Judie Petit. Ruel Favors, MD  Guidance:  ULTRASOUND FLUOROSCOPIC  MEDICATIONS AND MEDICAL HISTORY: 2 G ANCEF ADMINISTERED WITHIN 1 HR OF THE  PROCEDURE, 1.5 MG VERSED, 50 MCG FENTANYL  ANESTHESIA/SEDATION: 20 min  CONTRAST:  None.  FLUOROSCOPY TIME:  2 MIN 12 SECONDS  PROCEDURE: Informed consent was obtained from the patient following explanation of the procedure, risks, benefits and alternatives. The patient understands, agrees and consents for the procedure. All questions were addressed. A time out was performed.  Maximal barrier sterile technique utilized including caps, mask, sterile gowns, sterile gloves, large sterile drape, hand hygiene, and 2% chlorhexidine scrub.  Under sterile conditions and local anesthesia, left internal jugular micropuncture venous access was performed with ultrasound. Images were obtained for documentation. A guide wire was inserted followed by a transitional dilator. Next, a 0.035 guidewire was advanced into the IVC with a 5-French catheter. Measurements were obtained from the left venotomy site to the proximal right atrium. In the left infraclavicular chest, a subcutaneous tunnel was created under sterile conditions and local anesthesia. 1% lidocaine with epinephrine was utilized for this. The 27 cm tip to cuff hemo split catheter was tunneled subcutaneously to the venotomy site and inserted into the SVC/RA junction through a valved peel-away sheath. Position was confirmed with fluoroscopy. Images were obtained for documentation. Blood was aspirated from the catheter followed by saline and heparin flushes. The appropriate volume and strength of heparin was instilled in each lumen. Caps were applied. The catheter was secured at the tunnel site with Gelfoam and a pursestring suture. The venotomy site was closed with subcuticular Vicryl suture. Dermabond was applied to the small right neck incision. A dry sterile dressing was applied. The catheter is ready for use. No immediate complications.  COMPLICATIONS: No immediate  IMPRESSION: Ultrasound and fluoroscopically guided left internal jugular tunneled hemodialysis catheter  (27 cm tip to cuff hemo split catheter).   Electronically Signed   By: Ruel Favors M.D.   On: 10/16/2013 13:02    Assessment/Pl  LOS: 10 days  s/p Procedure(s): SUBXYPHOID PERICARDIAL WINDOW  Will obtain duplex scan of aortoiliac femoral system bilaterally and check ABIs to see if he is a good candidate for thigh graft. Will need to hold Coumadin Have tentatively scheduled thigh graft insertion for Friday-will determine which side after vascular lab studies are reviewed   Josephina Gip, MD 11/07/2013 1:47 PM

## 2013-10-17 NOTE — Progress Notes (Signed)
Patient and wife are agreeable to admit to inpt rehab today. I will arrange. [phone]

## 2013-10-17 NOTE — Progress Notes (Signed)
ANTICOAGULATION CONSULT NOTE   Pharmacy Consult for Coumadin Indication: atrial fibrillation  No Known Allergies  Labs:  Recent Labs  10/15/13 0600 10/16/13 0525 11/05/2013 0435 11/01/2013 0515  HGB 7.2* 6.9*  --  7.2*  HCT 22.9* 21.9*  --  23.6*  PLT 126* 128*  --  154  LABPROT 15.3* 15.8*  --  16.5*  INR 1.24 1.29  --  1.37  CREATININE 2.06* 2.50* 1.88*  --     The CrCl is unknown because both a height and weight (above a minimum accepted value) are required for this calculation.  Assessment: 67 y.o. male on coumadin for afib (started 10/14/13) and also noted with ESRD and plans for HD. Patient transferred to Rehab today and per Vascular surgery for thigh graft on Friday to hold coumadin for now. INR today is 1.37.  Goal of Therapy:  INR 2-3 Monitor platelets by anticoagulation protocol: Yes   Plan:  -Hold coumadin -Daily PT/INR -Will follow anticoagulation plans post procedure  Earnest Bailey D 10/19/2013 4:23 PM

## 2013-10-18 ENCOUNTER — Inpatient Hospital Stay (HOSPITAL_COMMUNITY): Payer: PRIVATE HEALTH INSURANCE | Admitting: Physical Therapy

## 2013-10-18 ENCOUNTER — Inpatient Hospital Stay (HOSPITAL_COMMUNITY): Payer: Medicare Other

## 2013-10-18 LAB — CBC
HCT: 22.8 % — ABNORMAL LOW (ref 39.0–52.0)
Hemoglobin: 7 g/dL — ABNORMAL LOW (ref 13.0–17.0)
MCH: 30 pg (ref 26.0–34.0)
MCHC: 30.7 g/dL (ref 30.0–36.0)
MCV: 97.9 fL (ref 78.0–100.0)
RBC: 2.33 MIL/uL — ABNORMAL LOW (ref 4.22–5.81)

## 2013-10-18 LAB — GLUCOSE, CAPILLARY
Glucose-Capillary: 101 mg/dL — ABNORMAL HIGH (ref 70–99)
Glucose-Capillary: 113 mg/dL — ABNORMAL HIGH (ref 70–99)

## 2013-10-18 LAB — RENAL FUNCTION PANEL
BUN: 29 mg/dL — ABNORMAL HIGH (ref 6–23)
CO2: 26 mEq/L (ref 19–32)
Calcium: 8.2 mg/dL — ABNORMAL LOW (ref 8.4–10.5)
Creatinine, Ser: 3.08 mg/dL — ABNORMAL HIGH (ref 0.50–1.35)
GFR calc Af Amer: 23 mL/min — ABNORMAL LOW (ref >90)
Glucose, Bld: 131 mg/dL — ABNORMAL HIGH (ref 70–99)
Phosphorus: 4.2 mg/dL (ref 2.3–4.6)

## 2013-10-18 LAB — PROTIME-INR
INR: 1.62 — ABNORMAL HIGH (ref 0.00–1.49)
Prothrombin Time: 18.8 seconds — ABNORMAL HIGH (ref 11.6–15.2)

## 2013-10-18 MED ORDER — CEFAZOLIN SODIUM 1-5 GM-% IV SOLN: 1.0000 g | INTRAVENOUS | Status: DC

## 2013-10-18 MED ORDER — DOXERCALCIFEROL 4 MCG/2ML IV SOLN
INTRAVENOUS | Status: AC
  Administered 2013-10-18: 2 ug via INTRAVENOUS
  Filled 2013-10-18: qty 2

## 2013-10-18 MED ORDER — CARVEDILOL 6.25 MG PO TABS
6.2500 mg | ORAL_TABLET | Freq: Two times a day (BID) | ORAL | Status: DC
  Administered 2013-10-18: 6.25 mg via ORAL
  Filled 2013-10-18 (×6): qty 1

## 2013-10-18 MED ORDER — CEFAZOLIN SODIUM-DEXTROSE 2-3 GM-% IV SOLR
2.0000 g | Freq: Once | INTRAVENOUS | Status: AC
  Administered 2013-10-19: 2 g via INTRAVENOUS
  Filled 2013-10-18: qty 50

## 2013-10-18 MED ORDER — ALTEPLASE 100 MG IV SOLR
4.0000 mg | Freq: Once | INTRAVENOUS | Status: AC
  Administered 2013-10-18: 4 mg
  Filled 2013-10-18: qty 4

## 2013-10-18 NOTE — Progress Notes (Signed)
Subjective/Complaints: Pt cold because room is cold (55degrees!). Otherwise night uneventful A 12 point review of systems has been performed and if not noted above is otherwise negative.   Objective: Vital Signs: Blood pressure 113/61, pulse 70, temperature 97.3 F (36.3 C), temperature source Oral, resp. rate 18, weight 89 kg (196 lb 3.4 oz), SpO2 99.00%. Ir Fluoro Guide Cv Line Left  10/16/2013   CLINICAL DATA:  END-STAGE RENAL DISEASE  EXAM: ULTRASOUND GUIDANCE FOR VASCULAR ACCESS  LEFT INTERNAL JUGULAR PERMANENT HEMODIALYSIS CATHETER  Date:  10/16/2013 12:33 PM12/12/2012 12:33 PM  Radiologist:  Judie Petit. Ruel Favors, MD  Guidance:  ULTRASOUND FLUOROSCOPIC  MEDICATIONS AND MEDICAL HISTORY: 2 G ANCEF ADMINISTERED WITHIN 1 HR OF THE PROCEDURE, 1.5 MG VERSED, 50 MCG FENTANYL  ANESTHESIA/SEDATION: 20 min  CONTRAST:  None.  FLUOROSCOPY TIME:  2 MIN 12 SECONDS  PROCEDURE: Informed consent was obtained from the patient following explanation of the procedure, risks, benefits and alternatives. The patient understands, agrees and consents for the procedure. All questions were addressed. A time out was performed.  Maximal barrier sterile technique utilized including caps, mask, sterile gowns, sterile gloves, large sterile drape, hand hygiene, and 2% chlorhexidine scrub.  Under sterile conditions and local anesthesia, left internal jugular micropuncture venous access was performed with ultrasound. Images were obtained for documentation. A guide wire was inserted followed by a transitional dilator. Next, a 0.035 guidewire was advanced into the IVC with a 5-French catheter. Measurements were obtained from the left venotomy site to the proximal right atrium. In the left infraclavicular chest, a subcutaneous tunnel was created under sterile conditions and local anesthesia. 1% lidocaine with epinephrine was utilized for this. The 27 cm tip to cuff hemo split catheter was tunneled subcutaneously to the venotomy site and  inserted into the SVC/RA junction through a valved peel-away sheath. Position was confirmed with fluoroscopy. Images were obtained for documentation. Blood was aspirated from the catheter followed by saline and heparin flushes. The appropriate volume and strength of heparin was instilled in each lumen. Caps were applied. The catheter was secured at the tunnel site with Gelfoam and a pursestring suture. The venotomy site was closed with subcuticular Vicryl suture. Dermabond was applied to the small right neck incision. A dry sterile dressing was applied. The catheter is ready for use. No immediate complications.  COMPLICATIONS: No immediate  IMPRESSION: Ultrasound and fluoroscopically guided left internal jugular tunneled hemodialysis catheter (27 cm tip to cuff hemo split catheter).   Electronically Signed   By: Ruel Favors M.D.   On: 10/16/2013 13:02   Ir US Guide Vasc Access Left  10/16/2013   CLINICAL DATA:  END-STAGE RENAL DISEASE  EXAM: ULTRASOUND GUIDANCE FOR VASCULAR ACCESS  LEFT INTERNAL JUGULAR PERMANENT HEMODIALYSIS CATHETER  Date:  10/16/2013 12:33 PM12/12/2012 12:33 PM  Radiologist:  Judie Petit. Ruel Favors, MD  Guidance:  ULTRASOUND FLUOROSCOPIC  MEDICATIONS AND MEDICAL HISTORY: 2 G ANCEF ADMINISTERED WITHIN 1 HR OF THE PROCEDURE, 1.5 MG VERSED, 50 MCG FENTANYL  ANESTHESIA/SEDATION: 20 min  CONTRAST:  None.  FLUOROSCOPY TIME:  2 MIN 12 SECONDS  PROCEDURE: Informed consent was obtained from the patient following explanation of the procedure, risks, benefits and alternatives. The patient understands, agrees and consents for the procedure. All questions were addressed. A time out was performed.  Maximal barrier sterile technique utilized including caps, mask, sterile gowns, sterile gloves, large sterile drape, hand hygiene, and 2% chlorhexidine scrub.  Under sterile conditions and local anesthesia, left internal jugular micropuncture venous access was performed with  ultrasound. Images were obtained for  documentation. A guide wire was inserted followed by a transitional dilator. Next, a 0.035 guidewire was advanced into the IVC with a 5-French catheter. Measurements were obtained from the left venotomy site to the proximal right atrium. In the left infraclavicular chest, a subcutaneous tunnel was created under sterile conditions and local anesthesia. 1% lidocaine with epinephrine was utilized for this. The 27 cm tip to cuff hemo split catheter was tunneled subcutaneously to the venotomy site and inserted into the SVC/RA junction through a valved peel-away sheath. Position was confirmed with fluoroscopy. Images were obtained for documentation. Blood was aspirated from the catheter followed by saline and heparin flushes. The appropriate volume and strength of heparin was instilled in each lumen. Caps were applied. The catheter was secured at the tunnel site with Gelfoam and a pursestring suture. The venotomy site was closed with subcuticular Vicryl suture. Dermabond was applied to the small right neck incision. A dry sterile dressing was applied. The catheter is ready for use. No immediate complications.  COMPLICATIONS: No immediate  IMPRESSION: Ultrasound and fluoroscopically guided left internal jugular tunneled hemodialysis catheter (27 cm tip to cuff hemo split catheter).   Electronically Signed   By: Ruel Favors M.D.   On: 10/16/2013 13:02    Recent Labs  10/16/13 0525 10/16/2013 0515  WBC 6.9 8.1  HGB 6.9* 7.2*  HCT 21.9* 23.6*  PLT 128* 154    Recent Labs  10/16/13 0525 11/13/2013 0435  NA 136 136  K 4.3 3.5  CL 100 99  GLUCOSE 153* 113*  BUN 38* 18  CREATININE 2.50* 1.88*  CALCIUM 7.9* 7.7*   CBG (last 3)   Recent Labs  10/24/2013 1736 11/09/2013 2036 10/18/13 0740  GLUCAP 136* 119* 98    Wt Readings from Last 3 Encounters:  10/18/13 89 kg (196 lb 3.4 oz)  10/16/13 82.7 kg (182 lb 5.1 oz)  10/16/13 82.7 kg (182 lb 5.1 oz)    Physical Exam:  Constitutional: He is oriented to  person, place, and time. He appears well-developed and well-nourished. He has a sickly appearance.  Gen: alert, NAD  HENT:  Head: Normocephalic and atraumatic.  Eyes: Conjunctivae are normal. Pupils are equal, round, and reactive to light.  exophthalmus  Neck: Normal range of motion.  Cardiovascular: Normal rate and regular rhythm.  Respiratory: Breath sounds normal. He has no wheezes. He has no rales.  GI: Soft. Bowel sounds are normal. He exhibits no distension. There is no tenderness.  Musculoskeletal: He exhibits edema. 1+ LE. Large linear ulcer with yellow along right shin with aquacel.  - left shin with small granulating wound--both of these with aqacel, absorbent dressing. Right forearm with open abraded area from prior excision site. Mild drainge. Left thumb with small abrasion.  Neurological: He is alert and oriented to person, place, and time. Strength 4/5 prox to distal UE's. 3/5 HF, 3+ KE and Left ADF and APF is 2-3/5 but somewhat limited by dressing and wounds. Right ankle with trace ADF and APF. Distal lower ext sensation slightly diminished. Overall seems to display reasonable insight and awareness. Cn exam intact  .Psych: no distress. No anxiety   Assessment/Plan: 1. Functional deficits secondary to metabolic encephalopathy and  deconditioning from multiple medical issues which require 3+ hours per day of interdisciplinary therapy in a comprehensive inpatient rehab setting. Physiatrist is providing close team supervision and 24 hour management of active medical problems listed below. Physiatrist and rehab team continue to assess barriers to  discharge/monitor patient progress toward functional and medical goals. FIM:                   Comprehension Comprehension Mode: Auditory Comprehension: 5-Follows basic conversation/direction: With no assist  Expression Expression Mode: Verbal Expression: 5-Expresses basic needs/ideas: With extra time/assistive  device  Social Interaction Social Interaction: 6-Interacts appropriately with others with medication or extra time (anti-anxiety, antidepressant).  Problem Solving Problem Solving: 5-Solves basic problems: With no assist  Memory Memory: 5-Recognizes or recalls 90% of the time/requires cueing < 10% of the time  Medical Problem List and Plan:  Metabolic encephalopathy with deconditioning  1. A fib/ DVT Prophylaxis/Anticoagulation: Pharmaceutical: Coumadin Monitor HR with bid checks. Continue coreg bid.  2. Pain Management: On neurontin for neuropathy.  3. Mood: continue xanax bid for chronic anxiety. Offer ego support.  4. Neuropsych: This patient is capable of making decisions on own behalf.  5. ESRD with failure of transplant: continue cellcept and prednisone. HD on T, TH, Sa--for AVG on Friday.  6. Hypotension: iv bolus yesterday. bp has been borderline low at baseline  -volume mgt per nephrology  7. Multiple wounds: woc follow up. Added protein supplement to help promote healing.  -LUE dressing can be changed to a bandaid. Still needs dressing on right forearm (cancer excision site) -lower ext dressings 8. CAD with ICM: continue coreg bid.  9. Acute on chronic CHF: managed with HD.  10. Pleural effusion: Treated with pericardial window. No complaints of dyspnea and SOB improving.  11. Anemia of chronic disease: Treated with IV iron. Continue aranesp weekly. Transfusion per neprhology  LOS (Days) 1 A FACE TO FACE EVALUATION WAS PERFORMED  Molleigh Huot T 10/18/2013 8:40 AM

## 2013-10-18 NOTE — Progress Notes (Signed)
[facility] Individual Statement of Services  Patient Name:  CEDRIK HEINDL  Date:  10/18/2013  Welcome to the [facility].  Our goal is to provide you with an individualized program based on your diagnosis and situation, designed to meet your specific needs.  With this comprehensive rehabilitation program, you will be expected to participate in at least 3 hours of rehabilitation therapies Monday-Friday, with modified therapy programming on the weekends.  Your rehabilitation program will include the following services:  Physical Therapy (PT), Occupational Therapy (OT), Speech Therapy (ST), 24 hour per day rehabilitation nursing, Therapeutic Recreaction (TR), Psychology, Case Management (Social Worker), Rehabilitation Medicine, Nutrition Services and Pharmacy Services  Weekly team conferences will be held on Tuesdays to discuss your progress.  Your Social Worker will talk with you frequently to get your input and to update you on team discussions.  Team conferences with you and your family in attendance may also be held.  Expected length of stay: 2-3 weeks  Overall anticipated outcome: Minimal assistance  Depending on your progress and recovery, your program may change. Your Social Worker will coordinate services and will keep you informed of any changes. Your Social Worker's name and contact numbers are listed  below.  The following services may also be recommended but are not provided by the [facility]:   Driving Evaluations  Home Health Rehabiltiation Services  Outpatient Rehabilitation Services    Arrangements will be made to provide these services after discharge if needed.  Arrangements include referral to agencies that provide these services.  Your insurance has been verified to be:  Medicare and BCBS Your primary doctor is:  Dr. [name]  Pertinent information will be shared with your doctor and your insurance  company.  Social Worker:  [place], [place] [phone] or [phone]   Information discussed with and copy given to patient by: Amada Jupiter, 10/18/2013, 11:44 AM

## 2013-10-18 NOTE — Progress Notes (Signed)
INITIAL NUTRITION ASSESSMENT  DOCUMENTATION CODES Per approved criteria  -Not Applicable   INTERVENTION: RD provided diet education with patient, provided several handouts and materials for patient to review. Encouraged to consume adequate amounts of protein. RD to continue to follow nutrition care plan.  NUTRITION DIAGNOSIS: Food- and nutrition-related knowledge deficit to ESRD as evidenced by [name] report.   Goal: Intake to meet >90% of estimated nutrition needs.  Monitor:  weight trends, lab trends, I/O's, PO intake, supplement tolerance  Reason for Assessment: Malnutrition Screening Tool  67 y.o. 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 Admitted with progressive decline of his renal allograft function. Underwent subxiphoid pericardial window and drainage of pericardial effusion on 11/25. HD initiated 11/26 2/2 fluid overload.   Currently eating 100% of Renal meals. Planning for L thigh AVG tomorrow.  This RD is familiar with this patient from previous inpatient rehab stay. [name] follows a strict low-sodium diet PTA. Very compliant and interested in renal education.  RD completed Renal Education during this visit. Provided Choose-A-Meal Booklet to patient. Reviewed food groups and provided written recommended serving sizes specifically determined for patient's current nutritional status.   Explained why diet restrictions are needed and provided lists of foods to limit/avoid that are high potassium, sodium, and phosphorus. Provided specific recommendations on safer alternatives of these foods. Strongly encouraged compliance of this diet.   Discussed importance of protein intake at each meal and snack. Provided examples of how to maximize protein intake throughout the day.  Discussed need for fluid restriction with dialysis, importance of minimizing weight gain between HD treatments, and renal-friendly beverage options.  Encouraged [name] to discuss specific diet questions/concerns with RD at HD outpatient facility. Teach back method used.  Expect good compliance.   Height: Ht Readings from Last 1 Encounters:  10/15/13 6\' 2"  (1.88 m)    Weight: Wt Readings from Last 1 Encounters:  10/18/13 196 lb 3.4 oz (89 kg)    Ideal Body Weight: 190 lb  % Ideal Body Weight: 103%  Wt Readings from Last 10 Encounters:  10/18/13 196 lb 3.4 oz (89 kg)  10/16/13 182 lb 5.1 oz (82.7 kg)  10/16/13 182 lb 5.1 oz (82.7 kg)  09/01/13 181 lb 14.1 oz (82.5 kg)  05/03/13 157 lb 3 oz (71.3 kg)  04/23/13 164 lb 7.4 oz (74.6 kg)  04/23/13 164 lb 7.4 oz (74.6 kg)  10/04/12 183 lb (83.008 kg)  08/23/12 175 lb (79.379 kg)  07/25/12 187 lb 2.7 oz (84.9 kg)    Usual Body Weight: 183 - 187 lb  % Usual Body Weight: 106%  BMI:  Body mass index is 25.18 kg/(m^2).  Estimated Nutritional Needs: Kcal: 2000 - 2200 Protein: 98 - 110 g Fluid: 1.2 liters  Skin: chest incision  Diet Order: Renal 80-90; 1200 ml fluid restriction  EDUCATION NEEDS: -No education needs identified at this time   Intake/Output Summary (Last 24 hours) at 10/18/13 1008 Last data filed at 10/18/13 0900  Gross per 24 hour  Intake    720 ml  Output      0 ml  Net    720 ml    Last BM: 11/30   Labs:   Recent Labs Lab 10/12/13 0600  10/13/13 0700  10/15/13 0600 10/16/13 0525 10/15/2013 0435  NA 132*  < >  139  < > 135 136 136  K 4.0  < > 3.0*  < > 3.9 4.3 3.5  CL 100  < > 107  < > 101 100 99  CO2 19  < > 20  < > 26 23 28   BUN 91*  < > 47*  < > 33* 38* 18  CREATININE 2.87*  < > 1.88*  < > 2.06* 2.50* 1.88*  CALCIUM 8.1*  < > 6.9*  < > 7.9* 7.9* 7.7*  MG 1.7  --  1.4*  --   --   --   --   PHOS 4.0  < >  --   --  3.2 3.9 2.9  GLUCOSE 128*  < > 96  < > 168* 153* 113*  < > = values in this  interval not displayed.  CBG (last 3)   Recent Labs  11/08/2013 1736 11/04/2013 2036 10/18/13 0740  GLUCAP 136* 119* 98    Scheduled Meds: . ALPRAZolam  0.25 mg Oral BID  . aspirin  81 mg Oral Daily  . atorvastatin  80 mg Oral q1800  . collagenase   Topical Daily  . [START ON 10/23/2013] darbepoetin (ARANESP) injection - DIALYSIS  60 mcg Intravenous Q Tue-HD  . docusate sodium  100 mg Oral Daily  . doxercalciferol  2 mcg Intravenous Q T,Th,Sa-HD  . gabapentin  300 mg Oral QHS  . hydrocerin   Topical BID  . insulin aspart  0-15 Units Subcutaneous TID WC  . insulin aspart  0-5 Units Subcutaneous QHS  . multivitamin  1 tablet Oral QHS  . mycophenolate  250 mg Oral BID  . pantoprazole  40 mg Oral Daily  . predniSONE  5 mg Oral BID WC  . sodium chloride  10-40 mL Intracatheter Q12H    Continuous Infusions:   Past Medical History  Diagnosis Date  . CAD (coronary artery disease)     a. 03/1990 - s/p MI and PTCA  . Systolic congestive heart failure 01/2012    a. 09/2013 Echo: EF 40-45%, inf/infsept & post HK, sev LVH, mod reduced RV fxn, PASP , large pericardial eff - 39 mm in maximal dimension, no RV collapse, dilated IVC w/o resp variation.  . Hypertension   . Diabetes mellitus   . ICD (implantable cardiac defibrillator) in place     a. BSX 02/2012, in setting of cardiac arrest.  . History of pneumonia 2011  . Arthritis   . History of blood transfusion   . CKD (chronic kidney disease), stage IV     a. 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 Cancer     a. Bladder Cancer- 2004;  b. Skin Cancer- Basal, Squamous, and a few melanoma  . Right foot drop   . Cellulitis   . Pericardial effusion     a. 09/2013 Echo: large pericardial eff - 39 mm in maximal dimension, no RV collapse, dilated IVC w/o resp variation.    Past Surgical History  Procedure Laterality Date  . Kidney transplant  02/13/1973  . Coronary angioplasty  03/1990  . Skin cancer excision       Numerous  . Eye surgery      Cataract Right Eye  . Insert / replace / remove pacemaker    . Uretheral transplation      Left to Right (transplanted kidney)  . Skin transplant      Left thigh to Left hand  . Knee arthroscopy  07/25/2012  Procedure: ARTHROSCOPY KNEE;  Surgeon: Cammy Copa, MD;  Location: [facility] OR;  Service: Orthopedics;  Laterality: Left;  Left knee arthroscopy, debridement, cultures  . Lumbar laminectomy/decompression microdiscectomy Bilateral 03/26/2013    Procedure: LUMBAR LAMINECTOMY/DECOMPRESSION MICRODISCECTOMY 1 LEVEL;  Surgeon: Reinaldo Meeker, MD;  Location: [facility];  Service: Neurosurgery;  Laterality: Bilateral;  . Lumbar laminectomy/decompression microdiscectomy N/A 04/19/2013    Procedure: Redo Lumbar Three to Four Decompression One LEVEL;  Surgeon: Cristi Loron, MD;  Location: [facility];  Service: Neurosurgery;  Laterality: N/A;  Redo Lumbar Three to Four Decompression One LEVEL  . Subxyphoid pericardial window N/A 10/08/2013    Procedure: SUBXYPHOID PERICARDIAL WINDOW;  Surgeon: Delight Ovens, MD;  Location: [facility] OR;  Service: Thoracic;  Laterality: N/A;    Jarold Motto MS, RD, LDN Pager: [phone] After-hours pager: [phone]

## 2013-10-18 NOTE — Progress Notes (Signed)
Patient ID: [name], male   DOB: [birth year], 67 y.o.   MRN: [id number] Vascular Surgery Progress Note  Subjective: Needs vascular access  Objective:  Filed Vitals:   10/18/13 0602  BP: 113/61  Pulse: 70  Temp: 97.3 F (36.3 C)  Resp: 18       Labs:  Recent Labs Lab 10/15/13 0600 10/16/13 0525 11/12/2013 0435  CREATININE 2.06* 2.50* 1.88*    Recent Labs Lab 10/15/13 0600 10/16/13 0525 10/19/2013 0435  NA 135 136 136  K 3.9 4.3 3.5  CL 101 100 99  CO2 26 23 28   BUN 33* 38* 18  CREATININE 2.06* 2.50* 1.88*  GLUCOSE 168* 153* 113*  CALCIUM 7.9* 7.9* 7.7*    Recent Labs Lab 10/15/13 0600 10/16/13 0525 10/30/2013 0515  WBC 9.3 6.9 8.1  HGB 7.2* 6.9* 7.2*  HCT 22.9* 21.9* 23.6*  PLT 126* 128* 154    Recent Labs Lab 10/15/13 0600 10/16/13 0525 10/30/2013 0515  INR 1.24 1.29 1.37    I/O last 3 completed shifts: In: 360 [P.O.:360] Out: -   Imaging: Ir Fluoro Guide Cv Line Left  10/16/2013   CLINICAL DATA:  END-STAGE RENAL DISEASE  EXAM: ULTRASOUND GUIDANCE FOR VASCULAR ACCESS  LEFT INTERNAL JUGULAR PERMANENT HEMODIALYSIS CATHETER  Date:  10/16/2013 12:33 PM12/12/2012 12:33 PM  Radiologist:  Judie Petit. Ruel Favors, MD  Guidance:  ULTRASOUND FLUOROSCOPIC  MEDICATIONS AND MEDICAL HISTORY: 2 G ANCEF ADMINISTERED WITHIN 1 HR OF THE PROCEDURE, 1.5 MG VERSED, 50 MCG FENTANYL  ANESTHESIA/SEDATION: 20 min  CONTRAST:  None.  FLUOROSCOPY TIME:  2 MIN 12 SECONDS  PROCEDURE: Informed consent was obtained from the patient following explanation of the procedure, risks, benefits and alternatives. The patient understands, agrees and consents for the procedure. All questions were addressed. A time out was performed.  Maximal barrier sterile technique utilized including caps, mask, sterile gowns, sterile gloves, large sterile drape, hand hygiene, and 2% chlorhexidine scrub.  Under sterile conditions and local anesthesia, left internal jugular micropuncture venous access was performed with  ultrasound. Images were obtained for documentation. A guide wire was inserted followed by a transitional dilator. Next, a 0.035 guidewire was advanced into the IVC with a 5-French catheter. Measurements were obtained from the left venotomy site to the proximal right atrium. In the left infraclavicular chest, a subcutaneous tunnel was created under sterile conditions and local anesthesia. 1% lidocaine with epinephrine was utilized for this. The 27 cm tip to cuff hemo split catheter was tunneled subcutaneously to the venotomy site and inserted into the SVC/RA junction through a valved peel-away sheath. Position was confirmed with fluoroscopy. Images were obtained for documentation. Blood was aspirated from the catheter followed by saline and heparin flushes. The appropriate volume and strength of heparin was instilled in each lumen. Caps were applied. The catheter was secured at the tunnel site with Gelfoam and a pursestring suture. The venotomy site was closed with subcuticular Vicryl suture. Dermabond was applied to the small right neck incision. A dry sterile dressing was applied. The catheter is ready for use. No immediate complications.  COMPLICATIONS: No immediate  IMPRESSION: Ultrasound and fluoroscopically guided left internal jugular tunneled hemodialysis catheter (27 cm tip to cuff hemo split catheter).   Electronically Signed   By: Ruel Favors M.D.   On: 10/16/2013 13:02   Ir US Guide Vasc Access Left  10/16/2013   CLINICAL DATA:  END-STAGE RENAL DISEASE  EXAM: ULTRASOUND GUIDANCE FOR VASCULAR ACCESS  LEFT INTERNAL JUGULAR PERMANENT HEMODIALYSIS CATHETER  Date:  10/16/2013 12:33 PM12/12/2012 12:33 PM  Radiologist:  Judie Petit. Ruel Favors, MD  Guidance:  ULTRASOUND FLUOROSCOPIC  MEDICATIONS AND MEDICAL HISTORY: 2 G ANCEF ADMINISTERED WITHIN 1 HR OF THE PROCEDURE, 1.5 MG VERSED, 50 MCG FENTANYL  ANESTHESIA/SEDATION: 20 min  CONTRAST:  None.  FLUOROSCOPY TIME:  2 MIN 12 SECONDS  PROCEDURE: Informed consent was  obtained from the patient following explanation of the procedure, risks, benefits and alternatives. The patient understands, agrees and consents for the procedure. All questions were addressed. A time out was performed.  Maximal barrier sterile technique utilized including caps, mask, sterile gowns, sterile gloves, large sterile drape, hand hygiene, and 2% chlorhexidine scrub.  Under sterile conditions and local anesthesia, left internal jugular micropuncture venous access was performed with ultrasound. Images were obtained for documentation. A guide wire was inserted followed by a transitional dilator. Next, a 0.035 guidewire was advanced into the IVC with a 5-French catheter. Measurements were obtained from the left venotomy site to the proximal right atrium. In the left infraclavicular chest, a subcutaneous tunnel was created under sterile conditions and local anesthesia. 1% lidocaine with epinephrine was utilized for this. The 27 cm tip to cuff hemo split catheter was tunneled subcutaneously to the venotomy site and inserted into the SVC/RA junction through a valved peel-away sheath. Position was confirmed with fluoroscopy. Images were obtained for documentation. Blood was aspirated from the catheter followed by saline and heparin flushes. The appropriate volume and strength of heparin was instilled in each lumen. Caps were applied. The catheter was secured at the tunnel site with Gelfoam and a pursestring suture. The venotomy site was closed with subcuticular Vicryl suture. Dermabond was applied to the small right neck incision. A dry sterile dressing was applied. The catheter is ready for use. No immediate complications.  COMPLICATIONS: No immediate  IMPRESSION: Ultrasound and fluoroscopically guided left internal jugular tunneled hemodialysis catheter (27 cm tip to cuff hemo split catheter).   Electronically Signed   By: Ruel Favors M.D.   On: 10/16/2013 13:02    Assessment/Plan:  POD #  LOS: 1 day   INSERTION OF ARTERIOVENOUS (AV) GORE-TEX GRAFT THIGH  #1 plan left thigh AV graft in the a.m. #2 no Coumadin today-can resume postoperatively   Josephina Gip, MD 10/18/2013 9:19 AM

## 2013-10-18 NOTE — Progress Notes (Signed)
Social Work  Social Work Assessment and Plan  Patient Details  Name: [name] MRN: [id number] Date of Birth: February 06, 1946  Today's Date: 10/18/2013  Problem List:  Patient Active Problem List   Diagnosis Date Noted  . Physical deconditioning 11/02/2013  . Warfarin anticoagulation 10/15/2013  . Atrial fibrillation 10/13/2013  . Cardiomyopathy, ischemic 10/09/2013  . Acute on chronic systolic CHF (congestive heart failure) 10/09/2013  . Hypoglycemia 10/07/2013  . Acute renal failure 10/07/2013  . Anemia in chronic kidney disease 09/01/2013  . Thrombocytopenia, unspecified 09/01/2013  . Type 2 diabetes mellitus with hypoglycemia 09/01/2013  . Hx of skin cancer, basal cell 08/31/2013  . Cellulitis 08/31/2013  . HNP (herniated nucleus pulposus), lumbar--redo microdisectomy 04/19/13 04/24/2013  . S/P laminectomy 04/22/2013  . Positive D dimer 04/10/2013  . Dehydration 04/10/2013  . Acute respiratory failure with hypoxia 04/10/2013  . Hx of decompressive lumbar laminectomy (L-3) JYN82, 2014 04/10/2013  . Pericardial effusion 04/10/2013  . SIRS (systemic inflammatory response syndrome) 04/09/2013  . Demand ischemia 04/09/2013  . Diabetes mellitus type 2, controlled 04/09/2013  . Cellulitis of right lower extremity 04/09/2013  . CAD (coronary artery disease) 04/09/2013  . Renal failure (ARF), acute on chronic kidney disease stage 3 04/09/2013  . HTN (hypertension) 10/04/2012  . Septic arthritis of knee, left 08/23/2012  . History of renal transplant 08/23/2012   Past Medical History:  Past Medical History  Diagnosis Date  . CAD (coronary artery disease)     a. 03/1990 - s/p MI and PTCA  . Systolic congestive heart failure 01/2012    a. 09/2013 Echo: EF 40-45%, inf/infsept & post HK, sev LVH, mod reduced RV fxn, PASP , large pericardial eff - 39 mm in maximal dimension, no RV collapse, dilated IVC w/o resp variation.  . Hypertension   . Diabetes mellitus   . ICD  (implantable cardiac defibrillator) in place     a. BSX 02/2012, in setting of cardiac arrest.  . History of pneumonia 2011  . Arthritis   . History of blood transfusion   . CKD (chronic kidney disease), stage IV     a. 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 Cancer     a. Bladder Cancer- 2004;  b. Skin Cancer- Basal, Squamous, and a few melanoma  . Right foot drop   . Cellulitis   . Pericardial effusion     a. 09/2013 Echo: large pericardial eff - 39 mm in maximal dimension, no RV collapse, dilated IVC w/o resp variation.   Past Surgical History:  Past Surgical History  Procedure Laterality Date  . Kidney transplant  02/13/1973  . Coronary angioplasty  03/1990  . Skin cancer excision      Numerous  . Eye surgery      Cataract Right Eye  . Insert / replace / remove pacemaker    . Uretheral transplation      Left to Right (transplanted kidney)  . Skin transplant      Left thigh to Left hand  . Knee arthroscopy  07/25/2012    Procedure: ARTHROSCOPY KNEE;  Surgeon: Cammy Copa, MD;  Location: [facility] OR;  Service: Orthopedics;  Laterality: Left;  Left knee arthroscopy, debridement, cultures  . Lumbar laminectomy/decompression microdiscectomy Bilateral 03/26/2013    Procedure: LUMBAR LAMINECTOMY/DECOMPRESSION MICRODISCECTOMY 1 LEVEL;  Surgeon: Reinaldo Meeker, MD;  Location: [facility];  Service: Neurosurgery;  Laterality: Bilateral;  . Lumbar laminectomy/decompression microdiscectomy N/A 04/19/2013    Procedure: Redo Lumbar Three  to Four Decompression One LEVEL;  Surgeon: Cristi Loron, MD;  Location: [facility];  Service: Neurosurgery;  Laterality: N/A;  Redo Lumbar Three to Four Decompression One LEVEL  . Subxyphoid pericardial window N/A 10/08/2013    Procedure: SUBXYPHOID PERICARDIAL WINDOW;  Surgeon: Delight Ovens, MD;  Location: [facility] OR;  Service: Thoracic;  Laterality: N/A;   Social History:  reports that he quit smoking about 30 years ago. He has never  used smokeless tobacco. He reports that he does not drink alcohol or use illicit drugs.  Family / Support Systems Marital Status: Married How Long?: 25 yrs Patient Roles: Spouse Spouse/Significant Other: wife, [name] @ (H) [phone] or (C) [phone] Children: pt with 3 adult children who live in  but none local - all supportive Anticipated Caregiver: wife Ability/Limitations of Caregiver: no limitations Caregiver Availability: 24/7 Family Dynamics: pt describes very supportive family - notes wife is able to provide 24/7 care  Social History Preferred language: English Religion: Baptist Cultural Background: NA Education: HS Read: Yes Write: Yes Employment Status: Retired Date Retired/Disabled/Unemployed: early 2014 Age Retired: 63 Fish farm manager Issues: none Guardian/Conservator: none - per MD pt capable of making decision on his own behalf   Abuse/Neglect Physical Abuse: Denies Verbal Abuse: Denies Sexual Abuse: Denies Exploitation of patient/patient's resources: Denies Self-Neglect: Denies  Emotional Status Pt's affect, behavior adn adjustment status: pt very pleasant, talkative - familiar to this SW from prior CIR stay in June.  Does become tearful when he talks about feeling he "disappointed" his wife with the failure of his kidney.  Able to rationalize that he knows this was out of his control, however, "... I had plans for [place] to travel everywhere now that I am retired..."  Reports that when his kidney failure began, he had initially chosen not to begin HD and had chosen to go onto Hospice services.  He then changed his mind "..when I thought about my grandkids..".  Pt expresses much frustration with his overall weakness and loss of independence.  Depression screen = 3 and feel that neuropsych consult for coping is very appropriate - will request.  Recent Psychosocial Issues: Retirement early this year.  Back surgery with good recovery this  summer. Pyschiatric History: None Substance Abuse History: None  Patient / Family Perceptions, Expectations & Goals Pt/Family understanding of illness & functional limitations: Pt and wife with good understanding of medical issues which have led to current state of physical deconditioning/ need for CIR. Familiar with CIR from prior stay. Premorbid pt/family roles/activities: Pt reports he had fully regained his independence following back surgeries.   Anticipated changes in roles/activities/participation: Pt may very well require assistance upon d/c with anticipation that wife would assume this caregiver role. Pt/family expectations/goals: "I just want to get stronger and be less of a burden on my wife and family"  [employer]: Other (Comment) (will be new to HD center) Premorbid Home Care/DME Agencies: Other (Comment) ([facility] after back surgery) Transportation available at discharge: yes Resource referrals recommended: Psychology;Support group (specify)  Discharge Planning Living Arrangements: Spouse/significant other Support Systems: Spouse/significant other Type of Residence: Private residence Insurance Resources: Administrator (specify) Herbalist) Financial Resources: Restaurant manager, fast food Screen Referred: No Living Expenses: Own Money Management: Patient Does the patient have any problems obtaining your medications?: No Home Management: pt and wife Patient/Family Preliminary Plans: pt fully intends to return home with his wife Expected length of stay: ELOS 14- 18 days  Clinical Impression Familiar  gentleman who was on CIR June 2014 now returning following kidney failure and now on HD. Emotional distressed over need to begin HD after having had this transplanted kidney x 40 yrs.  Fears of burdening wife who he has "tried hard to spoil her" over the years.  Will follow for support and d/c planning.  Recommend neuropsych consult  for coping/ support as well.  Essie Lagunes 10/18/2013, 11:41 AM

## 2013-10-18 NOTE — Procedures (Signed)
I was present at this session.  I have reviewed the session itself and made appropriate changes.  Prob with cath  TPA now . See if can work.    Sabina Beavers L 12/4/20144:57 PM

## 2013-10-18 NOTE — Progress Notes (Signed)
Hemodialysis-See flowsheet. Catheter not functioning. Tpa instilled at 1600. Went to check at 1645, arterial would not pull. Spoke with Dr. Darrick Penna. Order to keep in for 1 hour and recheck. Explained issue to pt.Will continue to monitor.

## 2013-10-18 NOTE — Evaluation (Signed)
Speech Language Pathology Assessment and Plan  Patient Details  Name: [name] MRN: [id number] Date of Birth: 1946/07/10  SLP Diagnosis:   N/A Rehab Potential:  N/A ELOS:   N/A  Today's Date: 10/18/2013 Time: 4098-1191 Time Calculation (min): 55 min  Problem List:  Patient Active Problem List   Diagnosis Date Noted  . Physical deconditioning 10/26/2013  . Warfarin anticoagulation 10/15/2013  . Atrial fibrillation 10/13/2013  . Cardiomyopathy, ischemic 10/09/2013  . Acute on chronic systolic CHF (congestive heart failure) 10/09/2013  . Hypoglycemia 10/07/2013  . Acute renal failure 10/07/2013  . Anemia in chronic kidney disease 09/01/2013  . Thrombocytopenia, unspecified 09/01/2013  . Type 2 diabetes mellitus with hypoglycemia 09/01/2013  . Hx of skin cancer, basal cell 08/31/2013  . Cellulitis 08/31/2013  . HNP (herniated nucleus pulposus), lumbar--redo microdisectomy 04/19/13 04/24/2013  . S/P laminectomy 04/22/2013  . Positive D dimer 04/10/2013  . Dehydration 04/10/2013  . Acute respiratory failure with hypoxia 04/10/2013  . Hx of decompressive lumbar laminectomy (L-3) [id number], 2014 04/10/2013  . Pericardial effusion 04/10/2013  . SIRS (systemic inflammatory response syndrome) 04/09/2013  . Demand ischemia 04/09/2013  . Diabetes mellitus type 2, controlled 04/09/2013  . Cellulitis of right lower extremity 04/09/2013  . CAD (coronary artery disease) 04/09/2013  . Renal failure (ARF), acute on chronic kidney disease stage 3 04/09/2013  . HTN (hypertension) 10/04/2012  . Septic arthritis of knee, left 08/23/2012  . History of renal transplant 08/23/2012   Past Medical History:  Past Medical History  Diagnosis Date  . CAD (coronary artery disease)     a. 03/1990 - s/p MI and PTCA  . Systolic congestive heart failure 01/2012    a. 09/2013 Echo: EF 40-45%, inf/infsept & post HK, sev LVH, mod reduced RV fxn, PASP , large pericardial eff - 39 mm in maximal  dimension, no RV collapse, dilated IVC w/o resp variation.  . Hypertension   . Diabetes mellitus   . ICD (implantable cardiac defibrillator) in place     a. BSX 02/2012, in setting of cardiac arrest.  . History of pneumonia 2011  . Arthritis   . History of blood transfusion   . CKD (chronic kidney disease), stage IV     a. 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 Cancer     a. Bladder Cancer- 2004;  b. Skin Cancer- Basal, Squamous, and a few melanoma  . Right foot drop   . Cellulitis   . Pericardial effusion     a. 09/2013 Echo: large pericardial eff - 39 mm in maximal dimension, no RV collapse, dilated IVC w/o resp variation.   Past Surgical History:  Past Surgical History  Procedure Laterality Date  . Kidney transplant  02/13/1973  . Coronary angioplasty  03/1990  . Skin cancer excision      Numerous  . Eye surgery      Cataract Right Eye  . Insert / replace / remove pacemaker    . Uretheral transplation      Left to Right (transplanted kidney)  . Skin transplant      Left thigh to Left hand  . Knee arthroscopy  07/25/2012    Procedure: ARTHROSCOPY KNEE;  Surgeon: Cammy Copa, MD;  Location: [facility] OR;  Service: Orthopedics;  Laterality: Left;  Left knee arthroscopy, debridement, cultures  . Lumbar laminectomy/decompression microdiscectomy Bilateral 03/26/2013    Procedure: LUMBAR LAMINECTOMY/DECOMPRESSION MICRODISCECTOMY 1 LEVEL;  Surgeon: Reinaldo Meeker, MD;  Location: [facility];  Service: Neurosurgery;  Laterality: Bilateral;  . Lumbar laminectomy/decompression microdiscectomy N/A 04/19/2013    Procedure: Redo Lumbar Three to Four Decompression One LEVEL;  Surgeon: Cristi Loron, MD;  Location: [facility];  Service: Neurosurgery;  Laterality: N/A;  Redo Lumbar Three to Four Decompression One LEVEL  . Subxyphoid pericardial window N/A 10/08/2013    Procedure: SUBXYPHOID PERICARDIAL WINDOW;  Surgeon: Delight Ovens, MD;  Location: [facility] OR;  Service:  Thoracic;  Laterality: N/A;    Assessment / Plan / Recommendation Clinical Impression  Pt is a 67 y.o. 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 He has multiple medical problems including coronary artery disease status post PTCA/ICD, CHF (EF 40-45%), hypertension, insulin-dependent diabetes and a history of basal cell cancer BLE as well as non-healing wound LLE. He was admitted on 10/07/13 with increasing SOB, hypoglycemia, and confusion with decrease in urine output as well as acute on chronic renal failure with BUN 112/ Cr-3.3. Dr. Tyrone Sage consulted for input on large pericardial effusion and surgical intervention recommended. Dr Allena Katz consulted for input and renal failure and felt that patient with uremia causing confusion and poor likelihood or renal recovery. Patient underwent subxiphoid pericardial window and drainage of pericardial effusion on 10/09/13. Hemodialysis initiated 11/26 due to fluid overload with poor recovery and plans for AVG if he does not improve over next 24-48 hours. IJ to be placed and plans for thigh AVG to be placed this Friday past check of ABI. He developed A fib and coumadin recommended by cardiology. Patient with CIR stay 04/2013 past lumbar surgery and would like to pursue CIR for progressive therapies prior to discharge to home. Therapies initiated and CIR recommended by PT/MD. SLP cognitive-linguistic evaluation completed 12/4, and pt appears to be within functional limits for tasks assessed. Pt was tangential in speech, however easily able to redirect and demonstrated intact alternating attention. Pt does not appear to need additional SLP f/u during this admission.    SLP Assessment  Patient does not need any further Speech Lanaguage Pathology  Services    Recommendations    N/A   SLP Frequency   N/A  SLP Treatment/Interventions   N/A   Pain Pain Assessment Pain Assessment: 0-10 Pain Score: 8  Pain Type: Acute pain Pain Location: Leg Pain Orientation: Right Pain Radiating Towards: hip Pain Descriptors / Indicators: Aching Pain Frequency: Constant Pain Onset: On-going Patients Stated Pain Goal: 2 Pain Intervention(s): Medication (See eMAR) (percocet 2 tabs po prn) Prior Functioning Cognitive/Linguistic Baseline: Within functional limits Type of Home: House  Lives With: Spouse Available Help at Discharge: Family;Available 24 hours/day Education: HS Vocation: Retired  Teacher, music Term Goals: Week 1:  N/A  See FIM for current functional status Refer to Care Plan for Long Term Goals  Recommendations for other services: None  Discharge Criteria: Patient will be discharged from SLP if patient refuses treatment 3 consecutive times without medical reason, if treatment goals not met, if there is a change in medical status, if patient makes no progress towards goals or if patient is discharged from hospital.  The above assessment, treatment plan, treatment alternatives and goals were discussed and mutually agreed upon: by patient   Maxcine Ham, M.A. CCC-SLP [phone]   Maxcine Ham 10/18/2013, 4:14 PM  .

## 2013-10-18 NOTE — Consult Note (Signed)
WOC wound follow-up consult note Reason for Consult:Requested to re-assess right leg wound.  Refer to previous WOC progress notes from 12/1 for assessment and plan of care. Wound type: Left leg full thickness wound slowly decreasing in size; 1.5X1.5X.2cm.  50% yellow, 50% red, large amt yellow drainage, no odor.  Continue present plan of care with Aquacel to absorb drainage and provide antimicrobial benefits.  Pt denies pain or burning to this site.  Dressing procedure/placement/frequency: Right leg with increased "burning feeling."  Previously ordered Santyl for chemical debridement and currently has Aquacel on the wound.  Removed this dressing and applied Santyl for chemical debridement as previously ordered; pt states it feels cool and soothing.  Wound unchanged in size; previously 100% slough, now 75% slough, 25% red.  Mod amt yellow drainage, no odor.  Trimmed away some of the loose slough with scissors,  Area painful to touch, no bleeding. Continue present plan of care with Santyl.  If wound remains with significant amt non-viable tissue upon D/C, then he could benefit from follow-up with the outpatient wound care center for continued debridement. He states he lives near [facility], which has this type of clinic.   Pt has several lesions on his arms which are suspicious in appearance and states he was due for an appointment with his dermatologist but became ill and was admitted.  There is no in-patient dermatology available at [facility] and he states he will obtain follow-up with this service after discharge.  He prefers these sites be protected with gauze and tape at this time and declined assessment of areas at this time. Please re-consult if further assistance is needed.  Thank-you,   Cammie Mcgee MSN, RN, CWOCN, [place], CNS [phone]

## 2013-10-18 NOTE — Progress Notes (Signed)
Patient information reviewed and entered into eRehab system by Nahara Dona, RN, CRRN, PPS Coordinator.  Information including medical coding and functional independence measure will be reviewed and updated through discharge.     Per nursing patient was given "Data Collection Information Summary for Patients in Inpatient Rehabilitation Facilities with attached "Privacy Act Statement-Health Care Records" upon admission.  

## 2013-10-18 NOTE — Evaluation (Signed)
Occupational Therapy Assessment and Plan  Patient Details  Name: [name] MRN: [id number] Date of Birth: [birth year]  OT Diagnosis: muscle weakness (generalized) Rehab Potential: Rehab Potential: Good ELOS: 21-28 days   Today's Date: 10/18/2013 Time: 0865-7846 Time Calculation (min): 70 min  Problem List:  Patient Active Problem List   Diagnosis Date Noted  . Physical deconditioning 10/19/2013  . Warfarin anticoagulation 10/15/2013  . Atrial fibrillation 10/13/2013  . Cardiomyopathy, ischemic 10/09/2013  . Acute on chronic systolic CHF (congestive heart failure) 10/09/2013  . Hypoglycemia 10/07/2013  . Acute renal failure 10/07/2013  . Anemia in chronic kidney disease 09/01/2013  . Thrombocytopenia, unspecified 09/01/2013  . Type 2 diabetes mellitus with hypoglycemia 09/01/2013  . Hx of skin cancer, basal cell 08/31/2013  . Cellulitis 08/31/2013  . HNP (herniated nucleus pulposus), lumbar--redo microdisectomy 04/19/13 04/24/2013  . S/P laminectomy 04/22/2013  . Positive D dimer 04/10/2013  . Dehydration 04/10/2013  . Acute respiratory failure with hypoxia 04/10/2013  . Hx of decompressive lumbar laminectomy (L-3) [id number], 2014 04/10/2013  . Pericardial effusion 04/10/2013  . SIRS (systemic inflammatory response syndrome) 04/09/2013  . Demand ischemia 04/09/2013  . Diabetes mellitus type 2, controlled 04/09/2013  . Cellulitis of right lower extremity 04/09/2013  . CAD (coronary artery disease) 04/09/2013  . Renal failure (ARF), acute on chronic kidney disease stage 3 04/09/2013  . HTN (hypertension) 10/04/2012  . Septic arthritis of knee, left 08/23/2012  . History of renal transplant 08/23/2012    Past Medical History:  Past Medical History  Diagnosis Date  . CAD (coronary artery disease)     a. 03/1990 - s/p MI and PTCA  . Systolic congestive heart failure 01/2012    a. 09/2013 Echo: EF 40-45%, inf/infsept & post HK, sev LVH, mod reduced RV fxn, PASP , large  pericardial eff - 39 mm in maximal dimension, no RV collapse, dilated IVC w/o resp variation.  . Hypertension   . Diabetes mellitus   . ICD (implantable cardiac defibrillator) in place     a. BSX 02/2012, in setting of cardiac arrest.  . History of pneumonia 2011  . Arthritis   . History of blood transfusion   . CKD (chronic kidney disease), stage IV     a. 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 Cancer     a. Bladder Cancer- 2004;  b. Skin Cancer- Basal, Squamous, and a few melanoma  . Right foot drop   . Cellulitis   . Pericardial effusion     a. 09/2013 Echo: large pericardial eff - 39 mm in maximal dimension, no RV collapse, dilated IVC w/o resp variation.   Past Surgical History:  Past Surgical History  Procedure Laterality Date  . Kidney transplant  02/13/1973  . Coronary angioplasty  03/1990  . Skin cancer excision      Numerous  . Eye surgery      Cataract Right Eye  . Insert / replace / remove pacemaker    . Uretheral transplation      Left to Right (transplanted kidney)  . Skin transplant      Left thigh to Left hand  . Knee arthroscopy  07/25/2012    Procedure: ARTHROSCOPY KNEE;  Surgeon: Cammy Copa, MD;  Location: [facility] OR;  Service: Orthopedics;  Laterality: Left;  Left knee arthroscopy, debridement, cultures  . Lumbar laminectomy/decompression microdiscectomy Bilateral 03/26/2013    Procedure: LUMBAR LAMINECTOMY/DECOMPRESSION MICRODISCECTOMY 1 LEVEL;  Surgeon: Reinaldo Meeker, MD;  Location: [facility];  Service: Neurosurgery;  Laterality: Bilateral;  . Lumbar laminectomy/decompression microdiscectomy N/A 04/19/2013    Procedure: Redo Lumbar Three to Four Decompression One LEVEL;  Surgeon: Cristi Loron, MD;  Location: [facility];  Service: Neurosurgery;  Laterality: N/A;  Redo Lumbar Three to Four Decompression One LEVEL  . Subxyphoid pericardial window N/A 10/08/2013    Procedure: SUBXYPHOID PERICARDIAL WINDOW;  Surgeon: Delight Ovens,  MD;  Location: [facility] OR;  Service: Thoracic;  Laterality: N/A;    Assessment & Plan Clinical Impression: Patient is a 67 y.o. 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 He has multiple medical problems including coronary artery disease status post PTCA/ICD, CHF (EF 40-45%), hypertension, insulin-dependent diabetes and a history of basal cell cancer BLE as well as non healing wound LLE.   Patient was admitted on 10/07/13 with increasing SOB, hypoglycemia, confusion with decrease in urine output as well as acute on chronic renal failure with BUN 112/ Cr-3.3. Dr. Tyrone Sage consulted for input on large pericardial effusion and surgical intervention recommended.Dr Allena Katz consulted for input and renal failure and felt that patient with uremia causing confusion and poor likely hood or renal recovery. Patient underwent subxiphoid pericardial window and drainage of pericardial effusion on 10/09/13. Hemodialysis initiated 11/26 .  Patient transferred to CIR on 11/12/2013 .    Patient currently requires max assist with basic self-care skills secondary to muscle weakness and decreased standing balance and decreased postural control.  Prior to hospitalization, patient could complete BADL with min assist.  Patient will benefit from skilled intervention to decrease level of assist with basic self-care skills prior to discharge home with care partner.  Anticipate patient will require minimal physical assistance and follow up home health.  OT - End of Session Activity Tolerance: Tolerates 10 - 20 min activity with multiple rests Endurance Deficit: Yes OT Assessment Rehab Potential: Good OT Patient demonstrates impairments in the following area(s): Balance;Endurance;Safety;Pain OT Basic  ADL's Functional Problem(s): Bathing;Dressing;Toileting OT Advanced ADL's Functional Problem(s): Simple Meal Preparation OT Transfers Functional Problem(s): Toilet;Tub/Shower OT Plan OT Intensity: Minimum of 1-2 x/day, 45 to 90 minutes OT Frequency: 5 out of 7 days OT Duration/Estimated Length of Stay: 21-28 days OT Treatment/Interventions: Balance/vestibular training;Discharge planning;Disease mangement/prevention;UE/LE Strength taining/ROM;Therapeutic Exercise;Therapeutic Activities;Self Care/advanced ADL retraining;Wheelchair propulsion/positioning;Pain Writer OT Self Feeding Anticipated Outcome(s): I OT Basic Self-Care Anticipated Outcome(s): Mod I OT Toileting Anticipated Outcome(s): Mod I OT Bathroom Transfers Anticipated Outcome(s): Min A OT Recommendation Patient destination: Home Follow Up Recommendations: Home health OT Equipment Recommended: None recommended by OT   Skilled Therapeutic Intervention 1:1 OT initial evaluation completed with treatment provided to emphasize transfers, endurance, effective use of environmental controls, w/c mobility and seated self-feeding/grooming skills.   Patient was unable to adjust or accommodate inadequate room temp as needed due poor response of utilities in room (HVAC).   Patient was relocated to new room and assisted with organization of materials during transfer with patient demonstrating intact skills of directing care, organization, and awareness of personal items during transfers.   OT Evaluation Precautions/Restrictions  Precautions Precautions: Fall Required Braces or Orthoses: Other Brace/Splint Other Brace/Splint: AFO (not present) for right foot drop Restrictions Weight Bearing Restrictions: No  General Chart Reviewed: Yes Family/Caregiver Present: No  Vital Signs Therapy Vitals Temp: 97.3 F (36.3 C)  Temp src: Oral Pulse Rate: 70 Resp: 18 BP: 113/61 mmHg Patient Position, if  appropriate: Lying Oxygen Therapy SpO2: 99 % O2 Device: Nasal cannula  Pain Pain Assessment Pain Assessment: 0-10 Pain Score: 7  Pain Type: Acute pain Pain Location: Leg Pain Orientation: Lower;Right Pain Descriptors / Indicators: Nagging Pain Onset: On-going Pain Intervention(s):  (premedicated) Multiple Pain Sites: No  Home Living/Prior Functioning Home Living Family/patient expects to be discharged to:: Private residence Living Arrangements: Spouse/significant other Available Help at Discharge: Family;Available 24 hours/day Type of Home: House Home Access: Level entry Home Layout: One level Additional Comments: Pt and wife built a fully handicap accessible house.   Lives With: Spouse Prior Function Level of Independence: Needs assistance with ADLs;Requires assistive device for independence Bath: Minimal Dressing: Minimal  Able to Take Stairs?: No Driving: Yes Vocation: Retired Leisure: Hobbies-yes (Comment) Comments: Learning how to play guitar   ADL  See FIM below    Vision/Perception  Vision - History Baseline Vision: No visual deficits Patient Visual Report: Other (comment);Unable to keep objects in focus (far vision deteriorating) Vision - Assessment Eye Alignment: Within Functional Limits Perception Perception: Within Functional Limits Praxis Praxis: Intact   Cognition Overall Cognitive Status: Within Functional Limits for tasks assessed Orientation Level: Oriented X4 Attention: Divided Divided Attention: Appears intact Memory: Appears intact Awareness: Appears intact Problem Solving: Appears intact Safety/Judgment: Appears intact  Sensation Sensation Light Touch: Appears Intact Proprioception: Impaired Detail Proprioception Impaired Details: Impaired RLE Additional Comments: Proprioceptive deficits present from prior back injury but worsened by present weakness Coordination Gross Motor Movements are Fluid and Coordinated: Yes Fine Motor  Movements are Fluid and Coordinated: Yes  Motor  Motor Motor: Within Functional Limits Motor - Skilled Clinical Observations: Significant generalized weakness in addition to residual Rt. LE deficits from prior back injury.   Mobility  Bed Mobility Bed Mobility: Sit to Supine Sit to Supine: 3: Mod assist;HOB flat Sit to Supine - Details (indicate cue type and reason): Pt needs assist for bil. LEs secondary to weakness. Pt reports this was not a problem prior to admission. Transfers Sit to Stand: 1: +2 Total assist;2: Max assist;From chair/3-in-1 Sit to Stand Details: Verbal cues for technique;Verbal cues for safe use of DME/AE;Verbal cues for sequencing;Tactile cues for placement;Tactile cues for sequencing;Tactile cues for weight shifting Sit to Stand Details (indicate cue type and reason): Max assist progressing to +2 total assist and inability to clear buttocks after 3 reps from wheelchair Stand to Sit: 1: +2 Total assist;3: Mod assist;To chair/3-in-1 Stand to Sit Details: Cues for safety and UE placement, decreased control of descent secondary to weakness   Trunk/Postural Assessment  Cervical Assessment Cervical Assessment: Exceptions to [facility] (forward head) Thoracic Assessment Thoracic Assessment: Exceptions to [facility] (kyphotic) Lumbar Assessment Lumbar Assessment: Exceptions to [facility] (posterior pelvic tilt) Postural Control Postural Control: Deficits on evaluation Righting Reactions: Impaired in standing   Balance Balance Balance Assessed: Yes Static Sitting Balance Static Sitting - Balance Support: Bilateral upper extremity supported;Feet supported Static Sitting - Level of Assistance: 5: Stand by assistance Static Sitting - Comment/# of Minutes: Supervision for safety due to weakness Static Standing Balance Static Standing - Balance Support: Bilateral upper extremity supported;During functional activity Static Standing - Level of Assistance: 3: Mod assist Static Standing -  Comment/# of Minutes: Increased sway, standing balance for ~45 sec to 1 min before needing to sit.  Extremity/Trunk Assessment RUE Assessment RUE Assessment: Within Functional Limits LUE Assessment LUE Assessment: Within Functional Limits  FIM:  FIM -  Bed/Chair Water engineer Transfer: 2: Chair or W/C > Bed: Max A (lift and lower assist);3: Sit > Supine: Mod A (lifting assist/Pt. 50-74%/lift 2 legs)   Refer to Care Plan for Long Term Goals  Recommendations for other services: None  Discharge Criteria: Patient will be discharged from OT if patient refuses treatment 3 consecutive times without medical reason, if treatment goals not met, if there is a change in medical status, if patient makes no progress towards goals or if patient is discharged from hospital.  The above assessment, treatment plan, treatment alternatives and goals were discussed and mutually agreed upon: by patient  [facility] 10/18/2013, 1:06 PM

## 2013-10-18 NOTE — Progress Notes (Signed)
Pt's LBM 10/13/13, refuses any interventions to relieve constipation

## 2013-10-18 NOTE — Evaluation (Addendum)
Physical Therapy Assessment and Plan  Patient Details  Name: [name] MRN: [id number] Date of Birth: January 09, 1946  PT Diagnosis: Abnormal posture, Abnormality of gait, Difficulty walking and Muscle weakness Rehab Potential: Good ELOS: ~26 days   Today's Date: 10/18/2013 Time: 9811-9147 Time Calculation (min): 60 min  Problem List:  Patient Active Problem List   Diagnosis Date Noted  . Physical deconditioning 18-Oct-2013  . Warfarin anticoagulation 10/15/2013  . Atrial fibrillation 10/13/2013  . Cardiomyopathy, ischemic 10/09/2013  . Acute on chronic systolic CHF (congestive heart failure) 10/09/2013  . Hypoglycemia 10/07/2013  . Acute renal failure 10/07/2013  . Anemia in chronic kidney disease 09/01/2013  . Thrombocytopenia, unspecified 09/01/2013  . Type 2 diabetes mellitus with hypoglycemia 09/01/2013  . Hx of skin cancer, basal cell 08/31/2013  . Cellulitis 08/31/2013  . HNP (herniated nucleus pulposus), lumbar--redo microdisectomy 04/19/13 04/24/2013  . S/P laminectomy 04/22/2013  . Positive D dimer 04/10/2013  . Dehydration 04/10/2013  . Acute respiratory failure with hypoxia 04/10/2013  . Hx of decompressive lumbar laminectomy (L-3) WGN56, 2014 04/10/2013  . Pericardial effusion 04/10/2013  . SIRS (systemic inflammatory response syndrome) 04/09/2013  . Demand ischemia 04/09/2013  . Diabetes mellitus type 2, controlled 04/09/2013  . Cellulitis of right lower extremity 04/09/2013  . CAD (coronary artery disease) 04/09/2013  . Renal failure (ARF), acute on chronic kidney disease stage 3 04/09/2013  . HTN (hypertension) 10/04/2012  . Septic arthritis of knee, left 08/23/2012  . History of renal transplant 08/23/2012    Past Medical History:  Past Medical History  Diagnosis Date  . CAD (coronary artery disease)     a. 03/1990 - s/p MI and PTCA  . Systolic congestive heart failure 01/2012    a. 09/2013 Echo: EF 40-45%, inf/infsept & post HK, sev LVH, mod reduced RV  fxn, PASP , large pericardial eff - 39 mm in maximal dimension, no RV collapse, dilated IVC w/o resp variation.  . Hypertension   . Diabetes mellitus   . ICD (implantable cardiac defibrillator) in place     a. BSX 02/2012, in setting of cardiac arrest.  . History of pneumonia 2011  . Arthritis   . History of blood transfusion   . CKD (chronic kidney disease), stage IV     a. 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 Cancer     a. Bladder Cancer- 2004;  b. Skin Cancer- Basal, Squamous, and a few melanoma  . Right foot drop   . Cellulitis   . Pericardial effusion     a. 09/2013 Echo: large pericardial eff - 39 mm in maximal dimension, no RV collapse, dilated IVC w/o resp variation.   Past Surgical History:  Past Surgical History  Procedure Laterality Date  . Kidney transplant  02/13/1973  . Coronary angioplasty  03/1990  . Skin cancer excision      Numerous  . Eye surgery      Cataract Right Eye  . Insert / replace / remove pacemaker    . Uretheral transplation      Left to Right (transplanted kidney)  . Skin transplant      Left thigh to Left hand  . Knee arthroscopy  07/25/2012    Procedure: ARTHROSCOPY KNEE;  Surgeon: Cammy Copa, MD;  Location: [facility] OR;  Service: Orthopedics;  Laterality: Left;  Left knee arthroscopy, debridement, cultures  . Lumbar laminectomy/decompression microdiscectomy Bilateral 03/26/2013    Procedure: LUMBAR LAMINECTOMY/DECOMPRESSION MICRODISCECTOMY 1 LEVEL;  Surgeon: Reinaldo Meeker, MD;  Location: [facility]UR[facility] ORS;  Service: Neurosurgery;  Laterality: Bilateral;  . Lumbar laminectomy/decompression microdiscectomy N/A 04/19/2013    Procedure: Redo Lumbar Three to Four Decompression One LEVEL;  Surgeon: Cristi Loron, MD;  Location: [facility] OR[facility];  Service: Neurosurgery;  Laterality: N/A;  Redo Lumbar Three to Four Decompression One LEVEL  . Subxyphoid pericardial window N/A 10/08/2013    Procedure: SUBXYPHOID PERICARDIAL WINDOW;   Surgeon: Delight Ovens, MD;  Location: [facility] OR;  Service: Thoracic;  Laterality: N/A;    Assessment & Plan Clinical Impression: [name] is a 67 y.o. 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 He has multiple medical problems including coronary artery disease status post PTCA/ICD, CHF (EF 40-45%), hypertension, insulin-dependent diabetes and a history of basal cell cancer BLE as well as non healing wound LLE. He was admitted on 10/07/13 with increasing SOB, hypoglycemia, confusion with decrease in urine output as well as acute on chronic renal failure with BUN 112/ Cr-3.3. Dr. Tyrone Sage consulted for input on large pericardial effusion and surgical intervention recommended.Dr Allena Katz consulted for input and renal failure and felt that patient with uremia causing confusion and poor likelyhood or renal recovery. Patient underwent subxiphoid pericardial window and drainage of pericardial effusion on 10/09/13. Hemodialysis initiated 11/26 due to fluid overload with poor recovery and plans for AVG if he does not imporve over next 24-48 hours. IJ to be placed and plans for thigh AVG to be placed this Friday past check of ABI . He developed A fib and coumadin recommended by cardiology. Patient with CIR stay 04/2013 past lumbar surgery and would like to pursue CIR for progressive therapies prior to discharge to home. Patient transferred to CIR on 10/29/2013 .   Pt familiar to me from prior stay. Patient currently requires +2 total assist with mobility secondary to muscle weakness, decreased cardiorespiratoy endurance, impaired timing and sequencing and decreased standing balance and decreased balance strategies.  Prior to hospitalization, patient was modified independent  with mobility (still  using RW from last admission) and lived with Spouse in a House home.  Home access is  Level entry. I anticipate we will continue to work towards gait goals but pt may need to be ready for sliding board transfers for dialysis days and for anticipated decline.   Patient will benefit from skilled PT intervention to maximize safe functional mobility, minimize fall risk and decrease caregiver burden for planned discharge home with 24 hour supervision/assist.  Anticipate patient will benefit from follow up [facility] at discharge.  PT - End of Session Endurance Deficit: Yes PT Assessment Rehab Potential: Good Barriers to Discharge: Decreased caregiver support PT Patient demonstrates impairments in the following area(s): Balance;Endurance;Pain;Safety;Skin Integrity PT Transfers Functional Problem(s): Bed Mobility;Bed to Chair;Car;Furniture PT Locomotion Functional Problem(s): Ambulation;Wheelchair Mobility PT Plan PT Intensity: Minimum of 1-2 x/day ,45 to 90 minutes PT Frequency: 5 out of 7 days PT Duration Estimated Length of Stay: ~26 days PT Treatment/Interventions: Ambulation/gait training;Balance/vestibular training;Disease management/prevention;Discharge planning;Community reintegration;DME/adaptive equipment instruction;Functional mobility training;Patient/family education;Pain management;Neuromuscular re-education;Psychosocial support;Skin care/wound management;Splinting/orthotics;Therapeutic Exercise;UE/LE Coordination activities;Therapeutic Activities;Stair training;UE/LE Strength taining/ROM;Wheelchair propulsion/positioning PT Transfers Anticipated Outcome(s): supervision/min assist PT Locomotion Anticipated Outcome(s): supervision/min assist, pt may require wheelchair primarily depending on progress.  PT Recommendation Follow Up Recommendations: 24 hour supervision/assistance;Home health PT Patient destination: Home Equipment Recommended:  To be determined  Skilled Therapeutic  Intervention  Three sit <> stands working on mechanics and anterior transition into standing. Practiced sliding board transfer back to bed as pt unable to reach standing from wheelchair. PT placed board and gave pt step by step cues, mod/max assist. Discussed goals of therapy. Pt became tearful during first aspect of session as he described that he had decided to call in Hospice but after looking at his grandchildren he wanted to keep trying to live. He was even more upset about the burden all of this has placed on his wife and that they have both mourned the fact that they had planned to travel a lot after retirement but now they "can't do anything." Provided emotional support, pt declined further support from chaplin or neuropsychologist.   PT Evaluation Precautions/Restrictions Precautions Precautions: Fall Required Braces or Orthoses: Other Brace/Splint Other Brace/Splint: AFO (not present) for right foot drop Restrictions Weight Bearing Restrictions: No General   Vital SignsTherapy Vitals BP: 120/68 mmHg Patient Position, if appropriate: Sitting (post stand) Pain Pain Assessment Pain Assessment: 0-10 Pain Score: 7  Pain Type: Acute pain Pain Location: Leg Pain Orientation: Lower;Right Pain Descriptors / Indicators: Nagging Pain Onset: On-going Patients Stated Pain Goal: 2 Pain Intervention(s):  (premedicated) Multiple Pain Sites: No Home Living/Prior Functioning Home Living Available Help at Discharge: Family;Available 24 hours/day Type of Home: House Home Access: Level entry Home Layout: One level Additional Comments: Pt and wife built a fully handicap accessible house.   Lives With: Spouse Prior Function Level of Independence: Needs assistance with ADLs;Requires assistive device for independence Bath: Minimal Dressing: Minimal  Able to Take Stairs?: No Driving: Yes Vocation: Retired Leisure: Hobbies-yes (Comment) Comments: Learning how to play guitar    Cognition Overall Cognitive Status: Within Functional Limits for tasks assessed Arousal/Alertness: Awake/alert Orientation Level: Oriented X4 Attention: Selective Selective Attention: Appears intact Divided Attention: Appears intact Memory: Appears intact Awareness: Appears intact Problem Solving: Appears intact Safety/Judgment: Appears intact Comments: Pt may have decreased awareness of deficits - unclear. Pt may be just optimistic and hopeful for functional return.  Sensation Sensation Light Touch: Appears Intact Proprioception: Impaired Detail Proprioception Impaired Details: Impaired RLE Additional Comments: Proprioceptive deficits present from prior back injury but worsened by present weakness Coordination Gross Motor Movements are Fluid and Coordinated: Yes Fine Motor Movements are Fluid and Coordinated: Yes Motor  Motor Motor: Within Functional Limits Motor - Skilled Clinical Observations: Significant generalized weakness in addition to residual Rt. LE deficits from prior back injury.   Mobility Bed Mobility Bed Mobility: Sit to Supine Sit to Supine: 3: Mod assist;HOB flat Sit to Supine - Details (indicate cue type and reason): Pt needs assist for bil. LEs secondary to weakness. Pt reports this was not a problem prior to admission. Transfers Transfers: Yes Sit to Stand: 1: +2 Total assist;2: Max assist;From chair/3-in-1 Sit to Stand Details: Verbal cues for technique;Verbal cues for safe use of DME/AE;Verbal cues for sequencing;Tactile cues for placement;Tactile cues for sequencing;Tactile cues for weight shifting Sit to Stand Details (indicate cue type and reason): Max assist progressing to +2 total assist and inability to clear buttocks after 3 reps from wheelchair Stand to Sit: 1: +2 Total assist;3: Mod assist;To chair/3-in-1 Stand to Sit Details: Cues for safety and UE placement, decreased control of descent secondary to weakness Stand Pivot Transfers:  (unable to  perform secondary to weakness, reverted to sliding board transfer) Lateral/Scoot Transfers: 3: Mod assist;With slide board Lateral/Scoot Transfer Details (indicate cue type and reason):  PT placement of board, verbal and tactile cues for sequencing and UE/LE placement.  Locomotion  Ambulation Ambulation: Yes Ambulation/Gait Assistance: 1: +2 Total assist Ambulation Distance (Feet):  (1 attempted step forwards then pt reported "things are going black") Assistive device: Rolling walker Ambulation/Gait Assistance Details: Chair immediately behind pt, pt reported environment going black upon standing but BP immediately upon sitting was 120/68. Pt very weak upon standing. Gait Gait:  (unable to assess due to limited standing tolerance) Wheelchair Mobility Wheelchair Mobility: Yes Wheelchair Assistance: 4: Min Education officer, museum: Both upper extremities Wheelchair Parts Management: Needs assistance Distance: Assist due to weakness, pt only able to propel ~30'  Trunk/Postural Assessment  Cervical Assessment Cervical Assessment: Exceptions to [facility] (forward head) Thoracic Assessment Thoracic Assessment: Exceptions to [facility] (kyphotic) Lumbar Assessment Lumbar Assessment: Exceptions to [facility] (posterior pelvic tilt) Postural Control Postural Control: Deficits on evaluation Righting Reactions: Impaired in standing  Balance Balance Balance Assessed: Yes Static Sitting Balance Static Sitting - Balance Support: Bilateral upper extremity supported;Feet supported Static Sitting - Level of Assistance: 5: Stand by assistance Static Sitting - Comment/# of Minutes: Supervision for safety due to weakness Static Standing Balance Static Standing - Balance Support: Bilateral upper extremity supported;During functional activity Static Standing - Level of Assistance: 3: Mod assist Static Standing - Comment/# of Minutes: Increased sway, standing balance for ~45 sec to 1 min before needing to  sit. Extremity Assessment  RUE Assessment RUE Assessment: Within Functional Limits   RLE Assessment RLE Assessment: Exceptions to [facility] RLE PROM (degrees) RLE Overall PROM Comments: WFL but stiff, Rt. ankle dorsiflexion most limited ROM ~10 degrees RLE Strength Right Hip Flexion: 1/5 Right Knee Flexion: 3-/5 Right Knee Extension: 3-/5 Right Ankle Dorsiflexion: 2-/5 LLE Assessment LLE Assessment: Exceptions to WFL LLE PROM (degrees) LLE Overall PROM Comments: WFL but stiff overall  LLE Strength Left Hip Flexion: 1/5 Left Hip ABduction: 3+/5 Left Hip ADduction: 3+/5 Left Knee Flexion: 3/5 Left Knee Extension: 3/5 Left Ankle Dorsiflexion: 3-/5  FIM:  FIM - Banker Devices: Sliding board Bed/Chair Transfer: 2: Chair or W/C > Bed: Max A (lift and lower assist);3: Sit > Supine: Mod A (lifting assist/Pt. 50-74%/lift 2 legs) FIM - Locomotion: Wheelchair Distance: Assist due to weakness, pt only able to propel ~30' Locomotion: Wheelchair: 1: Travels less than 50 ft with minimal assistance (Pt.>75%) FIM - Locomotion: Ambulation Ambulation/Gait Assistance: 1: +2 Total assist Locomotion: Ambulation: 1: Two helpers FIM - Locomotion: Stairs Locomotion: Stairs: 0: Activity did not occur (unable to attempt)   Refer to Care Plan for Long Term Goals  Recommendations for other services: Other: Disscused followup with Clergy or neuropsychologist but pt declined  Discharge Criteria: Patient will be discharged from PT if patient refuses treatment 3 consecutive times without medical reason, if treatment goals not met, if there is a change in medical status, if patient makes no progress towards goals or if patient is discharged from hospital.  The above assessment, treatment plan, treatment alternatives and goals were discussed and mutually agreed upon: by patient  Wilhemina Bonito 10/18/2013, 10:15 AM

## 2013-10-18 NOTE — Progress Notes (Signed)
[facility] ROUNDING NOTE   Subjective:  Transferred to rehab yesterday Dr. Hart Rochester plans left thigh AVG tomorrow I reduced carvedilol yesterday d/t low BP and it has fallen off the med list entirely... For HD today  Objective Vital signs in last 24 hours: Filed Vitals:   10/18/13 0602 10/18/13 0900  BP: 113/61 120/68  Pulse: 70   Temp: 97.3 F (36.3 C)   TempSrc: Oral   Resp: 18   Weight: 89 kg (196 lb 3.4 oz)   SpO2: 99%    Weight change:   Intake/Output Summary (Last 24 hours) at 10/18/13 1109 Last data filed at 10/18/13 0900  Gross per 24 hour  Intake    720 ml  Output      0 ml  Net    720 ml   Physical Exam:  BP 120/68  Pulse 70  Temp(Src) 97.3 F (36.3 C) (Oral)  Resp 18  Wt 89 kg (196 lb 3.4 oz)  SpO2 99%  temperature 98.8 F (37.1 C), temperature source Oral, resp. rate 21, height 6\' 2"  (1.88 m), weight 84.4 kg (186 lb 1.1 oz), SpO2 95.00%. Gen: resting in bed NAD Skin covered in scaly lesions, hyperkeratotic skin lesions, scars from prior skin cancers and dressing over left lower tibial wound; skin tears arms Lines in both sides of his neck with left sided ICD in place  ZOX:WRUEA RRR, normal S1 and S2  Resp:CTA bilaterally, no rales/rhonchi but breath sounds diminished at the bases  VWU:JWJX, NT, BS normal  Ext:1+ LE edema thighs  Left tunnelled catheter with dressing in place   Labs: Basic Metabolic Panel:  Recent Labs Lab 10/12/13 0600 10/12/13 1523 10/13/13 0700 10/14/13 0930 10/15/13 0600 10/16/13 0525 Oct 31, 2013 0435  NA 132* 132* 139 135 135 136 136  K 4.0 4.0 3.0* 3.6 3.9 4.3 3.5  CL 100 99 107 100 101 100 99  CO2 19 19 20 26 26 23 28   GLUCOSE 128* 171* 96 198* 168* 153* 113*  BUN 91* 86* 47* 28* 33* 38* 18  CREATININE 2.87* 2.89* 1.88* 1.64* 2.06* 2.50* 1.88*  CALCIUM 8.1* 8.0* 6.9* 7.5* 7.9* 7.9* 7.7*  PHOS 4.0 3.8  --   --  3.2 3.9 2.9    Recent Labs Lab 10/15/13 0600 10/16/13 0525 10-31-2013 0435  ALBUMIN 2.2*  2.1* 2.1*    Recent Labs Lab 10/12/13 1523 10/15/13 0600 10/16/13 0525 10-31-13 0515  WBC 8.3 9.3 6.9 8.1  HGB 7.2* 7.2* 6.9* 7.2*  HCT 21.7* 22.9* 21.9* 23.6*  MCV 91.9 94.6 95.6 98.7  PLT 95* 126* 128* 154    Recent Labs Lab October 31, 2013 0755 October 31, 2013 1131 10-31-13 1736 31-Oct-2013 2036 10/18/13 0740  GLUCAP 105* 125* 136* 119* 98    Studies/Results: Ir Fluoro Guide Cv Line Left  10/16/2013   CLINICAL DATA:  END-STAGE RENAL DISEASE  EXAM: ULTRASOUND GUIDANCE FOR VASCULAR ACCESS  LEFT INTERNAL JUGULAR PERMANENT HEMODIALYSIS CATHETER  Date:  10/16/2013 12:33 PM12/12/2012 12:33 PM  Radiologist:  Judie Petit. Ruel Favors, MD  Guidance:  ULTRASOUND FLUOROSCOPIC  MEDICATIONS AND MEDICAL HISTORY: 2 G ANCEF ADMINISTERED WITHIN 1 HR OF THE PROCEDURE, 1.5 MG VERSED, 50 MCG FENTANYL  ANESTHESIA/SEDATION: 20 min  CONTRAST:  None.  FLUOROSCOPY TIME:  2 MIN 12 SECONDS  PROCEDURE: Informed consent was obtained from the patient following explanation of the procedure, risks, benefits and alternatives. The patient understands, agrees and consents for the procedure. All questions were addressed. A time out was performed.  Maximal barrier sterile technique  utilized including caps, mask, sterile gowns, sterile gloves, large sterile drape, hand hygiene, and 2% chlorhexidine scrub.  Under sterile conditions and local anesthesia, left internal jugular micropuncture venous access was performed with ultrasound. Images were obtained for documentation. A guide wire was inserted followed by a transitional dilator. Next, a 0.035 guidewire was advanced into the IVC with a 5-French catheter. Measurements were obtained from the left venotomy site to the proximal right atrium. In the left infraclavicular chest, a subcutaneous tunnel was created under sterile conditions and local anesthesia. 1% lidocaine with epinephrine was utilized for this. The 27 cm tip to cuff hemo split catheter was tunneled subcutaneously to the venotomy site and  inserted into the SVC/RA junction through a valved peel-away sheath. Position was confirmed with fluoroscopy. Images were obtained for documentation. Blood was aspirated from the catheter followed by saline and heparin flushes. The appropriate volume and strength of heparin was instilled in each lumen. Caps were applied. The catheter was secured at the tunnel site with Gelfoam and a pursestring suture. The venotomy site was closed with subcuticular Vicryl suture. Dermabond was applied to the small right neck incision. A dry sterile dressing was applied. The catheter is ready for use. No immediate complications.  COMPLICATIONS: No immediate  IMPRESSION: Ultrasound and fluoroscopically guided left internal jugular tunneled hemodialysis catheter (27 cm tip to cuff hemo split catheter).   Electronically Signed   By: Ruel Favors M.D.   On: 10/16/2013 13:02   Ir US Guide Vasc Access Left  10/16/2013   CLINICAL DATA:  END-STAGE RENAL DISEASE  EXAM: ULTRASOUND GUIDANCE FOR VASCULAR ACCESS  LEFT INTERNAL JUGULAR PERMANENT HEMODIALYSIS CATHETER  Date:  10/16/2013 12:33 PM12/12/2012 12:33 PM  Radiologist:  Judie Petit. Ruel Favors, MD  Guidance:  ULTRASOUND FLUOROSCOPIC  MEDICATIONS AND MEDICAL HISTORY: 2 G ANCEF ADMINISTERED WITHIN 1 HR OF THE PROCEDURE, 1.5 MG VERSED, 50 MCG FENTANYL  ANESTHESIA/SEDATION: 20 min  CONTRAST:  None.  FLUOROSCOPY TIME:  2 MIN 12 SECONDS  PROCEDURE: Informed consent was obtained from the patient following explanation of the procedure, risks, benefits and alternatives. The patient understands, agrees and consents for the procedure. All questions were addressed. A time out was performed.  Maximal barrier sterile technique utilized including caps, mask, sterile gowns, sterile gloves, large sterile drape, hand hygiene, and 2% chlorhexidine scrub.  Under sterile conditions and local anesthesia, left internal jugular micropuncture venous access was performed with ultrasound. Images were obtained for  documentation. A guide wire was inserted followed by a transitional dilator. Next, a 0.035 guidewire was advanced into the IVC with a 5-French catheter. Measurements were obtained from the left venotomy site to the proximal right atrium. In the left infraclavicular chest, a subcutaneous tunnel was created under sterile conditions and local anesthesia. 1% lidocaine with epinephrine was utilized for this. The 27 cm tip to cuff hemo split catheter was tunneled subcutaneously to the venotomy site and inserted into the SVC/RA junction through a valved peel-away sheath. Position was confirmed with fluoroscopy. Images were obtained for documentation. Blood was aspirated from the catheter followed by saline and heparin flushes. The appropriate volume and strength of heparin was instilled in each lumen. Caps were applied. The catheter was secured at the tunnel site with Gelfoam and a pursestring suture. The venotomy site was closed with subcuticular Vicryl suture. Dermabond was applied to the small right neck incision. A dry sterile dressing was applied. The catheter is ready for use. No immediate complications.  COMPLICATIONS: No immediate  IMPRESSION: Ultrasound and  fluoroscopically guided left internal jugular tunneled hemodialysis catheter (27 cm tip to cuff hemo split catheter).   Electronically Signed   By: Ruel Favors M.D.   On: 10/16/2013 13:02   Medications:   . ALPRAZolam  0.25 mg Oral BID  . aspirin  81 mg Oral Daily  . atorvastatin  80 mg Oral q1800  . collagenase   Topical Daily  . [START ON 10/23/2013] darbepoetin (ARANESP) injection - DIALYSIS  60 mcg Intravenous Q Tue-HD  . docusate sodium  100 mg Oral Daily  . doxercalciferol  2 mcg Intravenous Q T,Th,Sa-HD  . gabapentin  300 mg Oral QHS  . hydrocerin   Topical BID  . insulin aspart  0-15 Units Subcutaneous TID WC  . insulin aspart  0-5 Units Subcutaneous QHS  . multivitamin  1 tablet Oral QHS  . mycophenolate  250 mg Oral BID  .  pantoprazole  40 mg Oral Daily  . predniSONE  5 mg Oral BID WC  . sodium chloride  10-40 mL Intracatheter Q12H   I  have reviewed scheduled and prn medications.  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 BP looks better but will need some BB - resume at 6.25 BID  Shortness of breath/pericardial effusion: s/p pericardial window/effusion drainage Cytology benign and likely from uremia.   Anemia: anemia secondary to chronic kidney disease.  On Aranesp 60/week and status post intravenous iron therapy  With planned thigh AVG tomorrow and Hb of 7 - will transfuse 2 U with HD today as likely to have blood loss with procedure tomorrow.  Secondary hyperpara  Phosphorus normal (no binders indicated at this time)  PTH 451.   Changed calcitriol to Hectorol with HD  Nutrition:  Low albumin noted and likely to be reflective of worsening chronic kidney disease.  Started on nutritional supplements  Ischemic cardiomyopathy  EF 40-45 on recent echo ICD in place On coumadin for AFib- to be held today  Dispo Inpatient rehab    Camille Bal, MD [facility] [phone] pager 10/18/2013, 11:09 AM

## 2013-10-19 ENCOUNTER — Inpatient Hospital Stay (HOSPITAL_COMMUNITY): Payer: Medicare Other | Admitting: Anesthesiology

## 2013-10-19 ENCOUNTER — Inpatient Hospital Stay (HOSPITAL_COMMUNITY): Payer: PRIVATE HEALTH INSURANCE | Admitting: Physical Therapy

## 2013-10-19 ENCOUNTER — Encounter (HOSPITAL_COMMUNITY): Payer: Medicare Other | Admitting: Anesthesiology

## 2013-10-19 ENCOUNTER — Encounter (HOSPITAL_COMMUNITY): Payer: Self-pay | Admitting: Anesthesiology

## 2013-10-19 ENCOUNTER — Inpatient Hospital Stay (HOSPITAL_COMMUNITY): Payer: Medicare Other

## 2013-10-19 ENCOUNTER — Encounter (HOSPITAL_COMMUNITY)
Admission: RE | Disposition: E | Payer: Self-pay | Source: Intra-hospital | Attending: Physical Medicine & Rehabilitation

## 2013-10-19 DIAGNOSIS — I120 Hypertensive chronic kidney disease with stage 5 chronic kidney disease or end stage renal disease: Secondary | ICD-10-CM | POA: Diagnosis not present

## 2013-10-19 DIAGNOSIS — N186 End stage renal disease: Secondary | ICD-10-CM

## 2013-10-19 DIAGNOSIS — Z5189 Encounter for other specified aftercare: Secondary | ICD-10-CM | POA: Diagnosis not present

## 2013-10-19 DIAGNOSIS — R5381 Other malaise: Secondary | ICD-10-CM

## 2013-10-19 HISTORY — PX: AV FISTULA PLACEMENT: SHX1204

## 2013-10-19 LAB — TYPE AND SCREEN
ABO/RH(D): A POS
Antibody Screen: NEGATIVE
Unit division: 0
Unit division: 0

## 2013-10-19 LAB — CBC
HCT: 27.1 % — ABNORMAL LOW (ref 39.0–52.0)
Hemoglobin: 8.9 g/dL — ABNORMAL LOW (ref 13.0–17.0)
MCH: 30.4 pg (ref 26.0–34.0)
MCHC: 32.8 g/dL (ref 30.0–36.0)
RBC: 2.93 MIL/uL — ABNORMAL LOW (ref 4.22–5.81)
RDW: 17.9 % — ABNORMAL HIGH (ref 11.5–15.5)
WBC: 8.3 10*3/uL (ref 4.0–10.5)

## 2013-10-19 LAB — RENAL FUNCTION PANEL
BUN: 16 mg/dL (ref 6–23)
CO2: 27 mEq/L (ref 19–32)
Chloride: 99 mEq/L (ref 96–112)
Creatinine, Ser: 2.24 mg/dL — ABNORMAL HIGH (ref 0.50–1.35)
GFR calc Af Amer: 34 mL/min — ABNORMAL LOW (ref >90)
GFR calc non Af Amer: 29 mL/min — ABNORMAL LOW (ref >90)
Glucose, Bld: 102 mg/dL — ABNORMAL HIGH (ref 70–99)
Potassium: 3.5 mEq/L (ref 3.5–5.1)

## 2013-10-19 LAB — GLUCOSE, CAPILLARY
Glucose-Capillary: 100 mg/dL — ABNORMAL HIGH (ref 70–99)
Glucose-Capillary: 100 mg/dL — ABNORMAL HIGH (ref 70–99)
Glucose-Capillary: 141 mg/dL — ABNORMAL HIGH (ref 70–99)
Glucose-Capillary: 128 mg/dL — ABNORMAL HIGH (ref 70–99)
Glucose-Capillary: 111 mg/dL — ABNORMAL HIGH (ref 70–99)

## 2013-10-19 LAB — PROTIME-INR: INR: 1.57 — ABNORMAL HIGH (ref 0.00–1.49)

## 2013-10-19 SURGERY — INSERTION OF ARTERIOVENOUS (AV) GORE-TEX GRAFT THIGH
Anesthesia: General | Site: Leg Upper | Laterality: Left

## 2013-10-19 MED ORDER — ONDANSETRON HCL 4 MG/2ML IJ SOLN
4.0000 mg | Freq: Once | INTRAMUSCULAR | Status: AC | PRN
  Administered 2013-10-19: 4 mg via INTRAVENOUS

## 2013-10-19 MED ORDER — SODIUM CHLORIDE 0.9 % IV SOLN
INTRAVENOUS | Status: DC
  Administered 2013-10-19: 10 mL/h via INTRAVENOUS

## 2013-10-19 MED ORDER — HYDROMORPHONE HCL PF 1 MG/ML IJ SOLN
INTRAMUSCULAR | Status: AC
  Administered 2013-10-19: 0.25 mg via INTRAVENOUS
  Filled 2013-10-19: qty 1

## 2013-10-19 MED ORDER — 0.9 % SODIUM CHLORIDE (POUR BTL) OPTIME
TOPICAL | Status: DC | PRN
  Administered 2013-10-19: 1000 mL

## 2013-10-19 MED ORDER — PHENYLEPHRINE HCL 10 MG/ML IJ SOLN
10.0000 mg | INTRAVENOUS | Status: DC | PRN
  Administered 2013-10-19: 100 ug/min via INTRAVENOUS
  Administered 2013-10-19: 12:00:00 via INTRAVENOUS

## 2013-10-19 MED ORDER — SODIUM CHLORIDE 0.9 % IR SOLN
Status: DC | PRN
  Administered 2013-10-19: 10:00:00

## 2013-10-19 MED ORDER — ONDANSETRON HCL 4 MG/2ML IJ SOLN
INTRAMUSCULAR | Status: DC | PRN
  Administered 2013-10-19 (×2): 4 mg via INTRAVENOUS

## 2013-10-19 MED ORDER — HYDROMORPHONE HCL PF 1 MG/ML IJ SOLN
0.2500 mg | INTRAMUSCULAR | Status: DC | PRN
  Administered 2013-10-19 (×2): 0.25 mg via INTRAVENOUS

## 2013-10-19 MED ORDER — WARFARIN - PHARMACIST DOSING INPATIENT: Freq: Every day | Status: DC

## 2013-10-19 MED ORDER — LIDOCAINE HCL (CARDIAC) 20 MG/ML IV SOLN
INTRAVENOUS | Status: DC | PRN
  Administered 2013-10-19: 100 mg via INTRAVENOUS

## 2013-10-19 MED ORDER — ALBUMIN HUMAN 5 % IV SOLN
INTRAVENOUS | Status: DC | PRN
  Administered 2013-10-19: 11:00:00 via INTRAVENOUS

## 2013-10-19 MED ORDER — WARFARIN SODIUM 5 MG PO TABS
5.0000 mg | ORAL_TABLET | Freq: Once | ORAL | Status: AC
  Administered 2013-10-19: 5 mg via ORAL
  Filled 2013-10-19 (×2): qty 1

## 2013-10-19 MED ORDER — PHENYLEPHRINE HCL 10 MG/ML IJ SOLN
INTRAMUSCULAR | Status: DC | PRN
  Administered 2013-10-19 (×2): 200 ug via INTRAVENOUS

## 2013-10-19 MED ORDER — FENTANYL CITRATE 0.05 MG/ML IJ SOLN
INTRAMUSCULAR | Status: DC | PRN
  Administered 2013-10-19: 25 ug via INTRAVENOUS
  Administered 2013-10-19: 75 ug via INTRAVENOUS

## 2013-10-19 MED ORDER — PROPOFOL 10 MG/ML IV BOLUS
INTRAVENOUS | Status: DC | PRN
  Administered 2013-10-19: 30 mg via INTRAVENOUS

## 2013-10-19 MED ORDER — CALCIUM CHLORIDE 10 % IV SOLN
INTRAVENOUS | Status: DC | PRN
  Administered 2013-10-19: 300 mg via INTRAVENOUS

## 2013-10-19 MED ORDER — ONDANSETRON HCL 4 MG/2ML IJ SOLN
INTRAMUSCULAR | Status: AC
  Filled 2013-10-19: qty 2

## 2013-10-19 MED ORDER — EPHEDRINE SULFATE 50 MG/ML IJ SOLN
INTRAMUSCULAR | Status: DC | PRN
  Administered 2013-10-19: 15 mg via INTRAVENOUS

## 2013-10-19 SURGICAL SUPPLY — 39 items
CANISTER SUCTION 2500CC (MISCELLANEOUS) ×2 IMPLANT
CLIP TI MEDIUM 24 (CLIP) ×2 IMPLANT
CLIP TI MEDIUM 6 (CLIP) ×2 IMPLANT
CLIP TI WIDE RED SMALL 6 (CLIP) ×2 IMPLANT
COVER SURGICAL LIGHT HANDLE (MISCELLANEOUS) ×2 IMPLANT
DERMABOND ADVANCED (GAUZE/BANDAGES/DRESSINGS) ×1
DERMABOND ADVANCED .7 DNX12 (GAUZE/BANDAGES/DRESSINGS) ×1 IMPLANT
DRAPE INCISE IOBAN 66X45 STRL (DRAPES) ×2 IMPLANT
ELECT REM PT RETURN 9FT ADLT (ELECTROSURGICAL) ×2
ELECTRODE REM PT RTRN 9FT ADLT (ELECTROSURGICAL) ×1 IMPLANT
GEL ULTRASOUND 20GR AQUASONIC (MISCELLANEOUS) ×2 IMPLANT
GLOVE BIO SURGEON STRL SZ 6.5 (GLOVE) ×6 IMPLANT
GLOVE BIOGEL PI IND STRL 6.5 (GLOVE) ×1 IMPLANT
GLOVE BIOGEL PI IND STRL 7.0 (GLOVE) ×1 IMPLANT
GLOVE BIOGEL PI INDICATOR 6.5 (GLOVE) ×1
GLOVE BIOGEL PI INDICATOR 7.0 (GLOVE) ×1
GLOVE SS BIOGEL STRL SZ 7 (GLOVE) ×1 IMPLANT
GLOVE SUPERSENSE BIOGEL SZ 7 (GLOVE) ×1
GOWN STRL NON-REIN LRG LVL3 (GOWN DISPOSABLE) ×6 IMPLANT
GRAFT GORETEX 6X40 (Vascular Products) ×2 IMPLANT
KIT BASIN OR (CUSTOM PROCEDURE TRAY) ×2 IMPLANT
KIT ROOM TURNOVER OR (KITS) ×2 IMPLANT
NEEDLE HYPO 25GX1X1/2 BEV (NEEDLE) ×2 IMPLANT
NS IRRIG 1000ML POUR BTL (IV SOLUTION) ×2 IMPLANT
PACK CV ACCESS (CUSTOM PROCEDURE TRAY) ×2 IMPLANT
PAD ARMBOARD 7.5X6 YLW CONV (MISCELLANEOUS) ×4 IMPLANT
SPONGE GAUZE 4X4 12PLY (GAUZE/BANDAGES/DRESSINGS) ×2 IMPLANT
SPONGE LAP 18X18 X RAY DECT (DISPOSABLE) ×2 IMPLANT
SUT PROLENE 6 0 BV (SUTURE) ×4 IMPLANT
SUT PROLENE 6 0 CC (SUTURE) ×6 IMPLANT
SUT VIC AB 2-0 CT1 27 (SUTURE) ×1
SUT VIC AB 2-0 CT1 TAPERPNT 27 (SUTURE) ×1 IMPLANT
SUT VIC AB 3-0 SH 27 (SUTURE) ×2
SUT VIC AB 3-0 SH 27X BRD (SUTURE) ×2 IMPLANT
SUT VIC AB 3-0 SH 8-18 (SUTURE) ×2 IMPLANT
TOWEL OR 17X24 6PK STRL BLUE (TOWEL DISPOSABLE) ×2 IMPLANT
TOWEL OR 17X26 10 PK STRL BLUE (TOWEL DISPOSABLE) ×2 IMPLANT
UNDERPAD 30X30 INCONTINENT (UNDERPADS AND DIAPERS) ×2 IMPLANT
WATER STERILE IRR 1000ML POUR (IV SOLUTION) ×2 IMPLANT

## 2013-10-19 NOTE — Progress Notes (Signed)
Accessed right IJ per protocol, good blood return.  Wasted 10 ml blood and connected normal saline at kvo to white port.

## 2013-10-19 NOTE — OR Nursing (Signed)
No change noted postop of  previously documented preop skin assessment

## 2013-10-19 NOTE — Transfer of Care (Signed)
Immediate Anesthesia Transfer of Care Note  Patient: [name]  Procedure(s) Performed: Procedure(s): INSERTION OF ARTERIOVENOUS (AV) GORE-TEX GRAFT THIGH (Left)  Patient Location: PACU  Anesthesia Type:General  Level of Consciousness: awake, alert  and patient cooperative  Airway & Oxygen Therapy: Patient Spontanous Breathing and Patient connected to nasal cannula oxygen  Post-op Assessment: Report given to PACU RN, Post -op Vital signs reviewed and stable and Patient moving all extremities X 4  Post vital signs: Reviewed and stable  Complications: No apparent anesthesia complications

## 2013-10-19 NOTE — Anesthesia Preprocedure Evaluation (Addendum)
Anesthesia Evaluation  Patient identified by MRN, date of birth, ID band Patient awake    Reviewed: Allergy & Precautions, H&P , NPO status , Patient's Chart, lab work & pertinent test results  Airway Mallampati: II TM Distance: >3 FB Neck ROM: Full  Mouth opening: Limited Mouth Opening  Dental  (+) Teeth Intact   Pulmonary former smoker,          Cardiovascular hypertension, Pt. on medications + CAD and +CHF + dysrhythmias + Cardiac Defibrillator     Neuro/Psych    GI/Hepatic   Endo/Other  diabetes, Well Controlled, Type 2  Renal/GU ESRF and DialysisRenal disease     Musculoskeletal   Abdominal   Peds  Hematology  (+) anemia ,   Anesthesia Other Findings   Reproductive/Obstetrics                         Anesthesia Physical Anesthesia Plan  ASA: III  Anesthesia Plan: General   Post-op Pain Management:    Induction: Intravenous  Airway Management Planned: LMA and Oral ETT  Additional Equipment:   Intra-op Plan:   Post-operative Plan: Extubation in OR  Informed Consent: I have reviewed the patients History and Physical, chart, labs and discussed the procedure including the risks, benefits and alternatives for the proposed anesthesia with the patient or authorized representative who has indicated his/her understanding and acceptance.   Dental advisory given  Plan Discussed with: CRNA, Anesthesiologist and Surgeon  Anesthesia Plan Comments:       Anesthesia Quick Evaluation

## 2013-10-19 NOTE — Anesthesia Procedure Notes (Signed)
Procedure Name: LMA Insertion Date/Time: 10/15/2013 10:25 AM Performed by: Rogelia Boga Pre-anesthesia Checklist: Patient identified, Emergency Drugs available, Suction available, Patient being monitored and Timeout performed Patient Re-evaluated:Patient Re-evaluated prior to inductionOxygen Delivery Method: Circle system utilized Preoxygenation: Pre-oxygenation with 100% oxygen Intubation Type: IV induction LMA Size: 4.0 Number of attempts: 1 Placement Confirmation: positive ETCO2 and breath sounds checked- equal and bilateral Tube secured with: Tape Dental Injury: Injury to lip  Comments: Left/center upper lip pinched and bleeding, pressure applied, slight ecchymosis present

## 2013-10-19 NOTE — Progress Notes (Signed)
PT Cancellation Note  Pt missed 30 min skilled PT as pt still out of room for surgical intervention. MD please update activity orders as appropriate.   Aina Rossbach (Cheek) Carleene Mains PT, DPT

## 2013-10-19 NOTE — Progress Notes (Signed)
ANTICOAGULATION CONSULT NOTE - Follow Up Consult  Pharmacy Consult for coumadin Indication: atrial fibrillation  No Known Allergies  Patient Measurements: Weight: 186 lb 1.1 oz (84.4 kg) Heparin Dosing Weight:   Vital Signs: Temp: 98.4 F (36.9 C) (12/05 1223) Temp src: Oral (12/05 0710) BP: 91/51 mmHg (12/05 1407) Pulse Rate: 70 (12/05 1407)  Labs:  Recent Labs  November 02, 2013 0435  11/02/13 0515 10/18/13 0835 10/18/13 1500 11/01/2013 0400 11/04/2013 0500  HGB  --   < > 7.2*  --  7.0* 8.9*  --   HCT  --   --  23.6*  --  22.8* 27.1*  --   PLT  --   --  154  --  154 142*  --   LABPROT  --   --  16.5* 18.8*  --  18.3*  --   INR  --   --  1.37 1.62*  --  1.57*  --   CREATININE 1.88*  --   --   --  3.08*  --  2.24*  < > = values in this interval not displayed.  The CrCl is unknown because both a height and weight (above a minimum accepted value) are required for this calculation.   Medications:  Scheduled:  . ALPRAZolam  0.25 mg Oral BID  . aspirin  81 mg Oral Daily  . atorvastatin  80 mg Oral q1800  . carvedilol  6.25 mg Oral BID WC  . collagenase   Topical Daily  . [START ON 10/23/2013] darbepoetin (ARANESP) injection - DIALYSIS  60 mcg Intravenous Q Tue-HD  . docusate sodium  100 mg Oral Daily  . doxercalciferol  2 mcg Intravenous Q T,Th,Sa-HD  . gabapentin  300 mg Oral QHS  . hydrocerin   Topical BID  . insulin aspart  0-15 Units Subcutaneous TID WC  . insulin aspart  0-5 Units Subcutaneous QHS  . multivitamin  1 tablet Oral QHS  . mycophenolate  250 mg Oral BID  . ondansetron      . pantoprazole  40 mg Oral Daily  . predniSONE  5 mg Oral BID WC  . sodium chloride  10-40 mL Intracatheter Q12H   Infusions:  . sodium chloride 0 mL/hr at 10/27/2013 1042    Assessment: 67 yo male with afib s/p vascular surgery will be resumed on coumadin (ok per MD).  INR today is 1.57 Goal of Therapy:  INR 2-3 Monitor platelets by anticoagulation protocol: Yes   Plan:  1)  Coumadin 5mg  po x1 2) INR in am  Lakysha Kossman, [name] 11/13/2013,2:59 PM

## 2013-10-19 NOTE — Anesthesia Postprocedure Evaluation (Signed)
  Anesthesia Post-op Note  Patient: [name]  Procedure(s) Performed: Procedure(s): INSERTION OF ARTERIOVENOUS (AV) GORE-TEX GRAFT THIGH (Left)  Patient Location: PACU  Anesthesia Type:General  Level of Consciousness: awake, sedated and patient cooperative  Airway and Oxygen Therapy: Patient Spontanous Breathing  Post-op Pain: mild  Post-op Assessment: Post-op Vital signs reviewed, Patient's Cardiovascular Status Stable, Respiratory Function Stable, Patent Airway, No signs of Nausea or vomiting and Pain level controlled  Post-op Vital Signs: stable  Complications: No apparent anesthesia complications

## 2013-10-19 NOTE — Preoperative (Signed)
Beta Blockers   Reason not to administer Beta Blockers:Not Applicable 

## 2013-10-19 NOTE — H&P (View-Only) (Signed)
Patient ID: [name], male   DOB: 07/20/1946, 67 y.o.   MRN: [id number] Vascular Surgery Progress Note  Subjective: Needs vascular access  Objective:  Filed Vitals:   10/18/13 0602  BP: 113/61  Pulse: 70  Temp: 97.3 F (36.3 C)  Resp: 18       Labs:  Recent Labs Lab 10/15/13 0600 10/16/13 0525 11/05/2013 0435  CREATININE 2.06* 2.50* 1.88*    Recent Labs Lab 10/15/13 0600 10/16/13 0525 10/22/2013 0435  NA 135 136 136  K 3.9 4.3 3.5  CL 101 100 99  CO2 26 23 28  BUN 33* 38* 18  CREATININE 2.06* 2.50* 1.88*  GLUCOSE 168* 153* 113*  CALCIUM 7.9* 7.9* 7.7*    Recent Labs Lab 10/15/13 0600 10/16/13 0525 10/29/2013 0515  WBC 9.3 6.9 8.1  HGB 7.2* 6.9* 7.2*  HCT 22.9* 21.9* 23.6*  PLT 126* 128* 154    Recent Labs Lab 10/15/13 0600 10/16/13 0525 11/09/2013 0515  INR 1.24 1.29 1.37    I/O last 3 completed shifts: In: 360 [P.O.:360] Out: -   Imaging: Ir Fluoro Guide Cv Line Left  10/16/2013   CLINICAL DATA:  END-STAGE RENAL DISEASE  EXAM: ULTRASOUND GUIDANCE FOR VASCULAR ACCESS  LEFT INTERNAL JUGULAR PERMANENT HEMODIALYSIS CATHETER  Date:  10/16/2013 12:33 PM12/12/2012 12:33 PM  Radiologist:  M. Trevor Shick, MD  Guidance:  ULTRASOUND FLUOROSCOPIC  MEDICATIONS AND MEDICAL HISTORY: 2 G ANCEF ADMINISTERED WITHIN 1 HR OF THE PROCEDURE, 1.5 MG VERSED, 50 MCG FENTANYL  ANESTHESIA/SEDATION: 20 min  CONTRAST:  None.  FLUOROSCOPY TIME:  2 MIN 12 SECONDS  PROCEDURE: Informed consent was obtained from the patient following explanation of the procedure, risks, benefits and alternatives. The patient understands, agrees and consents for the procedure. All questions were addressed. A time out was performed.  Maximal barrier sterile technique utilized including caps, mask, sterile gowns, sterile gloves, large sterile drape, hand hygiene, and 2% chlorhexidine scrub.  Under sterile conditions and local anesthesia, left internal jugular micropuncture venous access was performed with  ultrasound. Images were obtained for documentation. A guide wire was inserted followed by a transitional dilator. Next, a 0.035 guidewire was advanced into the IVC with a 5-French catheter. Measurements were obtained from the left venotomy site to the proximal right atrium. In the left infraclavicular chest, a subcutaneous tunnel was created under sterile conditions and local anesthesia. 1% lidocaine with epinephrine was utilized for this. The 27 cm tip to cuff hemo split catheter was tunneled subcutaneously to the venotomy site and inserted into the SVC/RA junction through a valved peel-away sheath. Position was confirmed with fluoroscopy. Images were obtained for documentation. Blood was aspirated from the catheter followed by saline and heparin flushes. The appropriate volume and strength of heparin was instilled in each lumen. Caps were applied. The catheter was secured at the tunnel site with Gelfoam and a pursestring suture. The venotomy site was closed with subcuticular Vicryl suture. Dermabond was applied to the small right neck incision. A dry sterile dressing was applied. The catheter is ready for use. No immediate complications.  COMPLICATIONS: No immediate  IMPRESSION: Ultrasound and fluoroscopically guided left internal jugular tunneled hemodialysis catheter (27 cm tip to cuff hemo split catheter).   Electronically Signed   By: Trevor  Shick M.D.   On: 10/16/2013 13:02   Ir Us Guide Vasc Access Left  10/16/2013   CLINICAL DATA:  END-STAGE RENAL DISEASE  EXAM: ULTRASOUND GUIDANCE FOR VASCULAR ACCESS  LEFT INTERNAL JUGULAR PERMANENT HEMODIALYSIS CATHETER    Date:  10/16/2013 12:33 PM12/12/2012 12:33 PM  Radiologist:  M. Trevor Shick, MD  Guidance:  ULTRASOUND FLUOROSCOPIC  MEDICATIONS AND MEDICAL HISTORY: 2 G ANCEF ADMINISTERED WITHIN 1 HR OF THE PROCEDURE, 1.5 MG VERSED, 50 MCG FENTANYL  ANESTHESIA/SEDATION: 20 min  CONTRAST:  None.  FLUOROSCOPY TIME:  2 MIN 12 SECONDS  PROCEDURE: Informed consent was  obtained from the patient following explanation of the procedure, risks, benefits and alternatives. The patient understands, agrees and consents for the procedure. All questions were addressed. A time out was performed.  Maximal barrier sterile technique utilized including caps, mask, sterile gowns, sterile gloves, large sterile drape, hand hygiene, and 2% chlorhexidine scrub.  Under sterile conditions and local anesthesia, left internal jugular micropuncture venous access was performed with ultrasound. Images were obtained for documentation. A guide wire was inserted followed by a transitional dilator. Next, a 0.035 guidewire was advanced into the IVC with a 5-French catheter. Measurements were obtained from the left venotomy site to the proximal right atrium. In the left infraclavicular chest, a subcutaneous tunnel was created under sterile conditions and local anesthesia. 1% lidocaine with epinephrine was utilized for this. The 27 cm tip to cuff hemo split catheter was tunneled subcutaneously to the venotomy site and inserted into the SVC/RA junction through a valved peel-away sheath. Position was confirmed with fluoroscopy. Images were obtained for documentation. Blood was aspirated from the catheter followed by saline and heparin flushes. The appropriate volume and strength of heparin was instilled in each lumen. Caps were applied. The catheter was secured at the tunnel site with Gelfoam and a pursestring suture. The venotomy site was closed with subcuticular Vicryl suture. Dermabond was applied to the small right neck incision. A dry sterile dressing was applied. The catheter is ready for use. No immediate complications.  COMPLICATIONS: No immediate  IMPRESSION: Ultrasound and fluoroscopically guided left internal jugular tunneled hemodialysis catheter (27 cm tip to cuff hemo split catheter).   Electronically Signed   By: Trevor  Shick M.D.   On: 10/16/2013 13:02    Assessment/Plan:  POD #  LOS: 1 day   INSERTION OF ARTERIOVENOUS (AV) GORE-TEX GRAFT THIGH  #1 plan left thigh AV graft in the a.m. #2 no Coumadin today-can resume postoperatively   James Lawson, MD 10/18/2013 9:19 AM         

## 2013-10-19 NOTE — Progress Notes (Signed)
Physical Therapy Session Note  Patient Details  Name: [name] MRN: [id number] Date of Birth: Jul 08, 1946  Today's Date: 11/10/2013 Time: 0830-0900 Time Calculation (min): 30 min  Short Term Goals: Week 1:  PT Short Term Goal 1 (Week 1): Pt will transfer bed <> chair with sliding board and min assist.  PT Short Term Goal 2 (Week 1): Pt will perform sit <> stands x 3 reps with max assist PT Short Term Goal 3 (Week 1): Pt will perform stand pivot transfer with max assist on consistent basis.  PT Short Term Goal 4 (Week 1): Pt will propel wheelchair x 75' continuous with supervision  Skilled Therapeutic Interventions/Progress Updates:    Pt reports significant fatigue from lack of sleep and HD in general. Willing to try with PT. Supine > sit min assist and increased time. Pt very weak and he is unable to overpower even slight resistance from a sheet on his foot.  Attempted multiple sit <> stands from very elevated bed, pt able to clear buttocks but unable to reach full stand. "I'm just too tired, I'm nauseated and need to go back to bed." Lateral scooting x 5 with minimal success. Sit > supine with mod assist. +2 needed to move to [facility] with pad. Pt expressed concern over functional decline and concerns of functional mobility after surgery, pt comforted, provided encouragement. Nursing in room reporting surgery coming for pt, pt missed last thirty minutes.   Pt reports nausea with position changes - appears to have some nystagmus but I was unable to get a good look. When questioned pt does report that room spins. Doubtful that he would tolerate testing or treating positions if he has BPPV. Will continue to assess however.    Therapy Documentation Precautions:  Precautions Precautions: Fall Required Braces or Orthoses: Other Brace/Splint Other Brace/Splint: AFO (not present) for right foot drop Restrictions Weight Bearing Restrictions: No General: Amount of Missed PT Time (min): 30  Minutes Missed Time Reason: Other (comment) (pt being taken for surgery) Pain: Pain Assessment Pain Assessment: No/denies pain Faces Pain Scale: Hurts a little bit Pain Intervention(s): Emotional support  See FIM for current functional status  Therapy/Group: Individual Therapy  Wilhemina Bonito 11/08/2013, 9:03 AM

## 2013-10-19 NOTE — Progress Notes (Signed)
Physical Therapy Session Note  Patient Details  Name: [name] MRN: [id number] Date of Birth: 03/23/1946  Today's Date: 11/01/2013 Time:  1400- 1433  Total Time: 33 min  Short Term Goals: Week 1:  PT Short Term Goal 1 (Week 1): Pt will transfer bed <> chair with sliding board and min assist.  PT Short Term Goal 2 (Week 1): Pt will perform sit <> stands x 3 reps with max assist PT Short Term Goal 3 (Week 1): Pt will perform stand pivot transfer with max assist on consistent basis.  PT Short Term Goal 4 (Week 1): Pt will propel wheelchair x 75' continuous with supervision  Skilled Therapeutic Interventions/Progress Updates:    Late entry - unable to chart while pt in HD.  Sliding board transfers bed <> wheelchair with min/mod assist, PT to place board and set-up wheelchair for transfer. Attempted standing transfer but pt too weak to reach standing even with max assist. Standing frame for standing tolerance x 2 bouts, mini squats for strengthening and endurance with sling relaxed. Extended rest breaks needed.  Therapy Documentation Precautions:  Precautions Precautions: Fall Required Braces or Orthoses: Other Brace/Splint Other Brace/Splint: AFO (not present) for right foot drop Restrictions Weight Bearing Restrictions: No Pain: Pain Assessment Pain Assessment: Faces Pain Score: 8- Rt. LE, repositioned and requested pain medication  See FIM for current functional status  Therapy/Group: Individual Therapy  Wilhemina Bonito 11/09/2013, 6:54 AM

## 2013-10-19 NOTE — IPOC Note (Signed)
Overall Plan of Care [facility]) Patient Details Name: [name] MRN: [id number] DOB: [birth year]  Admitting Diagnosis: deconditioned ESRD  Hospital Problems: Active Problems:   Physical deconditioning     Functional Problem List: Nursing Bladder;Skin Integrity;Bowel;Medication Management;Pain;Perception;Safety;Edema  PT Balance;Endurance;Pain;Safety;Skin Integrity  OT Balance;Endurance;Safety;Pain  SLP    TR         Basic ADL's: OT Bathing;Dressing;Toileting     Advanced  ADL's: OT Simple Meal Preparation     Transfers: PT Bed Mobility;Bed to Chair;Car;Furniture  OT Toilet;Tub/Shower     Locomotion: PT Ambulation;Wheelchair Mobility     Additional Impairments: OT    SLP        TR      Anticipated Outcomes Item Anticipated Outcome  Self Feeding I  Swallowing      Basic self-care  Mod I  Toileting  Mod I   Bathroom Transfers Min A  Bowel/Bladder  cont of bowel and bladder  Transfers  supervision/min assist  Locomotion  supervision/min assist, pt may require wheelchair primarily depending on progress.   Communication     Cognition     Pain  <3  Safety/Judgment  adhere to safety precaution   Therapy Plan: PT Intensity: Minimum of 1-2 x/day ,45 to 90 minutes PT Frequency: 5 out of 7 days PT Duration Estimated Length of Stay: ~26 days OT Intensity: Minimum of 1-2 x/day, 45 to 90 minutes OT Frequency: 5 out of 7 days OT Duration/Estimated Length of Stay: 21-28 days         Team Interventions: Nursing Interventions Patient/Family Education;Bladder Management;Bowel Management;Discharge Planning;Psychosocial Support;Pain Management  PT interventions Ambulation/gait training;Balance/vestibular training;Disease management/prevention;Discharge planning;Community reintegration;DME/adaptive equipment instruction;Functional mobility training;Patient/family education;Pain management;Neuromuscular re-education;Psychosocial support;Skin care/wound  management;Splinting/orthotics;Therapeutic Exercise;UE/LE Coordination activities;Therapeutic Activities;Stair training;UE/LE Strength taining/ROM;Wheelchair propulsion/positioning  OT Interventions Balance/vestibular training;Discharge planning;Disease mangement/prevention;UE/LE Strength taining/ROM;Therapeutic Exercise;Therapeutic Activities;Self Care/advanced ADL retraining;Wheelchair propulsion/positioning;Pain management;DME/adaptive equipment instruction  SLP Interventions    TR Interventions    SW/CM Interventions Discharge Planning;Psychosocial Support;Patient/Family Education    Team Discharge Planning: Destination: PT-Home ,OT- Home , SLP-  Projected Follow-up: PT-24 hour supervision/assistance;Home health PT, OT-  Home health OT, SLP-  Projected Equipment Needs: PT-To be determined, OT- None recommended by OT, SLP-  Equipment Details: PT- , OT-  Patient/family involved in discharge planning: PT- Patient,  OT-Patient, SLP-Patient  MD ELOS: 25 days Medical Rehab Prognosis:  Excellent Assessment: The patient has been admitted for CIR therapies. The team will be addressing, functional mobility, strength, stamina, balance, safety, adaptive techniques/equipment, self-care, bowel and bladder mgt, patient and caregiver education, pain mgt, activity tolerance in reference to new HD, wound care/mgt. Goals have been set at supervision to min assist for basic mobility (w/c for longer dx) and mod I for basic self-care.Ranelle Oyster, MD, FAAPMR      See Team Conference Notes for weekly updates to the plan of care

## 2013-10-19 NOTE — Interval H&P Note (Signed)
History and Physical Interval Note:  11/07/2013 9:52 AM  [name]  has presented today for surgery, with the diagnosis of ESRD  The various methods of treatment have been discussed with the patient and family. After consideration of risks, benefits and other options for treatment, the patient has consented to  Procedure(s): INSERTION OF ARTERIOVENOUS (AV) GORE-TEX GRAFT THIGH (Left) as a surgical intervention .  The patient's history has been reviewed, patient examined, no change in status, stable for surgery.  I have reviewed the patient's chart and labs.  Questions were answered to the patient's satisfaction.     Josephina Gip

## 2013-10-19 NOTE — Progress Notes (Signed)
Subjective/Complaints: Anxious over AVG placement today. Otherwise doing fairly well.  A 12 point review of systems has been performed and if not noted above is otherwise negative.   Objective: Vital Signs: Blood pressure 91/54, pulse 71, temperature 100.1 F (37.8 C), temperature source Oral, resp. rate 18, weight 84.4 kg (186 lb 1.1 oz), SpO2 94.00%. No results found.  Recent Labs  10/18/13 1500 11/01/2013 0400  WBC 10.2 8.3  HGB 7.0* 8.9*  HCT 22.8* 27.1*  PLT 154 142*    Recent Labs  10/18/13 1500 10/31/2013 0500  NA 133* 137  K 4.0 3.5  CL 96 99  GLUCOSE 131* 102*  BUN 29* 16  CREATININE 3.08* 2.24*  CALCIUM 8.2* 7.9*   CBG (last 3)   Recent Labs  10/18/13 1145 10/18/13 2203 10/22/2013 0719  GLUCAP 101* 113* 100*    Wt Readings from Last 3 Encounters:  10/23/2013 84.4 kg (186 lb 1.1 oz)  11/11/2013 84.4 kg (186 lb 1.1 oz)  10/16/13 82.7 kg (182 lb 5.1 oz)    Physical Exam:  Constitutional: He is oriented to person, place, and time. He appears well-developed and well-nourished. He has a sickly appearance.  Gen: alert, NAD  HENT:  Head: Normocephalic and atraumatic.  Eyes: Conjunctivae are normal. Pupils are equal, round, and reactive to light.  exophthalmus  Neck: Normal range of motion.  Cardiovascular: Normal rate and regular rhythm.  Respiratory: Breath sounds normal. He has no wheezes. He has no rales.  GI: Soft. Bowel sounds are normal. He exhibits no distension. There is no tenderness.  Musculoskeletal: He exhibits edema. 1+ LE.   ulcer with yellow along right shin with aquacel.  - left shin with small granulating wound-- . Right forearm with open abraded area from prior excision site.  Mild drainge. Left thumb with small abrasion.  Neurological: He is alert and oriented to person, place, and time. Strength 4/5 prox to distal UE's. 3/5 HF, 3+ KE and Left ADF and APF is 2-3/5 but somewhat limited by dressing and wounds. Right ankle with trace ADF and  APF. Distal lower ext sensation slightly diminished. Overall seems to display reasonable insight and awareness. Cn exam intact  .Psych: no distress. No anxiety   Assessment/Plan: 1. Functional deficits secondary to metabolic encephalopathy and  deconditioning from multiple medical issues which require 3+ hours per day of interdisciplinary therapy in a comprehensive inpatient rehab setting. Physiatrist is providing close team supervision and 24 hour management of active medical problems listed below. Physiatrist and rehab team continue to assess barriers to discharge/monitor patient progress toward functional and medical goals. FIM: FIM - Bathing Bathing: 0: Activity did not occur  FIM - Upper Body Dressing/Undressing Upper body dressing/undressing steps patient completed: Thread/unthread right sleeve of front closure shirt/dress;Thread/unthread left sleeve of front closure shirt/dress;Pull shirt around back of front closure shirt/dress Upper body dressing/undressing: 4: Min-Patient completed 75 plus % of tasks FIM - Lower Body Dressing/Undressing Lower body dressing/undressing: 2: Max-Patient completed 25-49% of tasks        FIM - Banker Devices: Sliding board Bed/Chair Transfer: 2: Chair or W/C > Bed: Max A (lift and lower assist);3: Sit > Supine: Mod A (lifting assist/Pt. 50-74%/lift 2 legs);3: Supine > Sit: Mod A (lifting assist/Pt. 50-74%/lift 2 legs;3: Bed > Chair or W/C: Mod A (lift or lower assist)  FIM - Locomotion: Wheelchair Distance: Assist due to weakness, pt only able to propel ~30' Locomotion: Wheelchair: 1: Travels less than 50 ft with minimal  assistance (Pt.>75%) FIM - Locomotion: Ambulation Ambulation/Gait Assistance: 1: +2 Total assist Locomotion: Ambulation: 1: Two helpers  Comprehension Comprehension Mode: Auditory Comprehension: 6-Follows complex conversation/direction: With extra time/assistive  device  Expression Expression Mode: Verbal Expression: 6-Expresses complex ideas: With extra time/assistive device  Social Interaction Social Interaction: 6-Interacts appropriately with others with medication or extra time (anti-anxiety, antidepressant).  Problem Solving Problem Solving: 5-Solves complex 90% of the time/cues < 10% of the time  Memory Memory: 6-Assistive device: No helper  Medical Problem List and Plan:  Metabolic encephalopathy with deconditioning  1. A fib/ DVT Prophylaxis/Anticoagulation: Pharmaceutical: Coumadin Monitor HR with bid checks. Continue coreg bid.  2. Pain Management: On neurontin for neuropathy.  3. Mood: continue xanax bid for chronic anxiety. Offer ego support. Overall coping fairly well 4. Neuropsych: This patient is capable of making decisions on own behalf.  5. ESRD with failure of transplant: continue cellcept and prednisone. HD on T, TH, Sa--for AVG today 6. Hypotension: iv bolus yesterday. bp has been borderline low at baseline  -volume mgt per nephrology  7. Multiple wounds: woc follow up. Added protein supplement to help promote healing.  -RUE dressing -lower ext dressings 8. CAD with ICM: continue coreg bid.  9. Acute on chronic CHF: managed with HD.  10. Pleural effusion: Treated with pericardial window. No complaints of dyspnea and SOB improving.  11. Anemia of chronic disease: Treated with IV iron. Continue aranesp weekly. Transfusion per neprhology  LOS (Days) 2 A FACE TO FACE EVALUATION WAS PERFORMED  Ludene Stokke T 10/30/2013 8:04 AM

## 2013-10-19 NOTE — Op Note (Signed)
OPERATIVE REPORT  Date of Surgery: 10/27/2013 - 10/23/2013  Surgeon: Josephina Gip, MD  Assistant: Lorrine Kin  Pre-op Diagnosis: ESRD  Post-op Diagnosis: en stage renal disease  Procedure: Procedure(s): INSERTION OF ARTERIOVENOUS (AV) GORE-TEX GRAFT THIGH-left superficial femoral artery to common femoral vein with 6 mm Gore-Tex  Anesthesia: LMA  EBL: Minimal  Complications: None  Procedure Details: Patient was taken to the operating room placed in supine position at which time satisfactory general that she'll only anesthesia was administered. The left inguinal area and left thigh were prepped with Betadine scrub and solution draped in routine sterile manner. A short longitudinal incision was made just below the inguinal crease on the left. After subcutaneous tissue. There was diffuse edema. Superficial femoral artery was exposed it was densely and concentrically calcified. It was dissected up to the common femoral which was also densely calcified. There were a few soft areas. Common femoral vein was then mobilized at the saphenofemoral junction. The saphenous vein had been previously removed ligature was noted at the saphenofemoral junction. A subcutaneous tunnel was then created using a curved tunneler was with a short counterincision at the apex of the loop a 6 mm Gore-Tex graft delivered through the tunnel. No heparin was given. Superficial femoral artery was occluded just distal to its origin with a clamp and a soft area was identified opened with 15 blade extended with the Potts scissors. There was good backbleeding and antegrade bleeding. Cortex was slightly spatulated and anastomosed end to side with 6-0 Prolene. Following this attention turned to the femoral vein which is occluded proximally and distally. The previous saphenofemoral junction was opened longitudinally and extended with Potts scissors. Cortex was spatulated and anastomosed end to side with 6-0 Prolene. Clamps were  released there was good pulse and thrill in the graft and also good Doppler flow in the superficial femoral artery distal to the origin of the graft which diminished slightly with the graft open. Adequate hemostasis was achieved the distal wound closed in layers with Vicryl in a subcuticular fashion. The proximal wound closed with a continuous 2-0 Vicryl in the deep layer interrupted 3-0 Vicryl subcutaneously and 3-0 subcuticular stitch sterile dressings applied patient taken to the recovery room in stable condition   Josephina Gip, MD 10/19/2013 12:09 PM

## 2013-10-19 NOTE — Progress Notes (Signed)
Occupational Therapy Session Note  Patient Details  Name: [name] MRN: [id number] Date of Birth: 11/08/1946  Today's Date: 10/26/2013 Time: 0730-0830 60 min  Short Term Goals: Week 1:  OT Short Term Goal 1 (Week 1): Patient will complete sliding board transfer, bed <> w/c with mod assist OT Short Term Goal 2 (Week 1): Patient will complete upper body bathing at sink side with min assist OT Short Term Goal 3 (Week 1): Patient will lower body bathing, seated, with mod assist OT Short Term Goal 4 (Week 1): Patient will complete dressing, seated, using lateral leans and AE, prn, for lower body dressing with mod assist. OT Short Term Goal 5 (Week 1): Patient will complete 30 min of ADL using energy conservation skills and 1 rest break   Skilled Therapeutic Interventions: ADL-retraining with emphasis on improved endurance and adapted bathing/dressing skills while supine in bed.   Patient greeted in his room, awaiting scheduled surgery.   Patient was alert and oriented although reporting poor sleep due to pending surgical intervention.   Patient receptive to engage in bathing session in bed with dressing deferred due to imminent transfer for surgery.   Patient participated in pan bath, washing his face, arms, chest, perineal area, and stomach with only setup assist.   Patient required total assist for lower body bathing but completed bed mobility, rolling right and left as needed to have his back and buttocks washed.   Lotion applied to feet and legs.   Patient requested shampoo cap for hair care with setup assist and supervision.   Patient remained supine during bathing, raising head of bed to improve access to peri-area.   Patient reported satisfaction with his level of cleanliness during this session.    Therapy Documentation Precautions:  Precautions Precautions: Fall Required Braces or Orthoses: Other Brace/Splint Other Brace/Splint: AFO (not present) for right foot  drop Restrictions Weight Bearing Restrictions: No   Pain: No report of pain  See FIM for current functional status  Therapy/Group: Individual Therapy  Tsion Inghram 10/21/2013, 7:40 AM

## 2013-10-20 ENCOUNTER — Other Ambulatory Visit: Payer: Self-pay | Admitting: Physical Medicine & Rehabilitation

## 2013-10-20 ENCOUNTER — Inpatient Hospital Stay (HOSPITAL_COMMUNITY): Payer: PRIVATE HEALTH INSURANCE | Admitting: Physical Therapy

## 2013-10-20 ENCOUNTER — Inpatient Hospital Stay (HOSPITAL_COMMUNITY): Admission: AD | Admit: 2013-10-20 | Payer: Medicare Other | Source: Intra-hospital | Admitting: Critical Care Medicine

## 2013-10-20 LAB — POCT I-STAT 7, (LYTES, BLD GAS, ICA,H+H)
Acid-base deficit: 16 mmol/L — ABNORMAL HIGH (ref 0.0–2.0)
Calcium, Ion: 1.21 mmol/L (ref 1.13–1.30)
HCT: 27 % — ABNORMAL LOW (ref 39.0–52.0)
Hemoglobin: 9.2 g/dL — ABNORMAL LOW (ref 13.0–17.0)
O2 Saturation: 82 %
pCO2 arterial: 71.5 mmHg (ref 35.0–45.0)
pH, Arterial: 6.961 — CL (ref 7.350–7.450)
pO2, Arterial: 73 mmHg — ABNORMAL LOW (ref 80.0–100.0)

## 2013-10-20 LAB — BASIC METABOLIC PANEL
CO2: 17 mEq/L — ABNORMAL LOW (ref 19–32)
Calcium: 10.4 mg/dL (ref 8.4–10.5)
Chloride: 98 mEq/L (ref 96–112)
Creatinine, Ser: 2.86 mg/dL — ABNORMAL HIGH (ref 0.50–1.35)
GFR calc Af Amer: 25 mL/min — ABNORMAL LOW (ref >90)
Glucose, Bld: 108 mg/dL — ABNORMAL HIGH (ref 70–99)

## 2013-10-20 LAB — LACTIC ACID, PLASMA: Lactic Acid, Venous: 14.6 mmol/L — ABNORMAL HIGH (ref 0.5–2.2)

## 2013-10-20 MED FILL — Medication: Qty: 1 | Status: AC

## 2013-10-21 ENCOUNTER — Inpatient Hospital Stay (HOSPITAL_COMMUNITY): Payer: PRIVATE HEALTH INSURANCE | Admitting: Physical Therapy

## 2013-10-21 ENCOUNTER — Inpatient Hospital Stay (HOSPITAL_COMMUNITY): Payer: PRIVATE HEALTH INSURANCE | Admitting: *Deleted

## 2013-10-21 ENCOUNTER — Encounter (HOSPITAL_COMMUNITY): Payer: PRIVATE HEALTH INSURANCE | Admitting: *Deleted

## 2013-10-22 ENCOUNTER — Encounter (HOSPITAL_COMMUNITY): Payer: PRIVATE HEALTH INSURANCE

## 2013-10-22 ENCOUNTER — Inpatient Hospital Stay (HOSPITAL_COMMUNITY): Payer: PRIVATE HEALTH INSURANCE | Admitting: Rehabilitation

## 2013-10-22 ENCOUNTER — Inpatient Hospital Stay (HOSPITAL_COMMUNITY): Payer: PRIVATE HEALTH INSURANCE | Admitting: Physical Therapy

## 2013-10-22 ENCOUNTER — Inpatient Hospital Stay (HOSPITAL_COMMUNITY): Payer: PRIVATE HEALTH INSURANCE | Admitting: *Deleted

## 2013-10-22 ENCOUNTER — Telehealth: Payer: Self-pay

## 2013-10-22 NOTE — Progress Notes (Signed)
Late entry for 11/08/2013:  Dr. notified of low blood pressure on patient. Order received for 125cc ns bolus.

## 2013-10-22 NOTE — Discharge Summary (Signed)
Death Summary   Patient ID: [name] MRN: [id number] DOB/AGE: [birth year] 65 y.o.  The patient expired on Sep 12, 2013 at 1 am.  Cause of Death: Suspected PE v/s Cardiac arrest.  Final Diagnosis: Deconditioning. Metabolic encephalopathy. ESRD. A fib Acute on chronic systolic CHF. Diabetes Mellitus, type 2. CAD with ICM Anemia of chronic disease.    Labs:  Basic Metabolic Panel:  Recent Labs Lab 10/16/13 0525 11/03/2013 0435 10/18/13 1500 11/02/2013 0500 November 12, 2013 0039 2013-11-12 0054  NA 136 136 133* 137 139 133*  K 4.3 3.5 4.0 3.5 6.8* 5.9*  CL 100 99 96 99 98  --   CO2 23 28 26 27  17*  --   GLUCOSE 153* 113* 131* 102* 108*  --   BUN 38* 18 29* 16 24*  --   CREATININE 2.50* 1.88* 3.08* 2.24* 2.86*  --   CALCIUM 7.9* 7.7* 8.2* 7.9* 10.4  --   PHOS 3.9 2.9 4.2 3.0  --   --     CBC:  Recent Labs Lab 11/12/2013 0515 10/18/13 1500 11/03/2013 0400 11/12/13 0054  WBC 8.1 10.2 8.3  --   HGB 7.2* 7.0* 8.9* 9.2*  HCT 23.6* 22.8* 27.1* 27.0*  MCV 98.7 97.9 92.5  --   PLT 154 154 142*  --     CBG:  Recent Labs Lab 11/06/2013 0719 10/18/2013 0949 11/13/2013 1224 10/21/2013 1650 10/17/2013 2051  GLUCAP 100* 100* 141* 128* 111*    Hospital Course: [name] was a 67 y.o. Male with history of coronary artery disease status post PTCA/ICD, CHF (EF 40-45%), hypertension, insulin-dependent diabetes as well as renal transplant with progressive decline in renal function.  He was  admitted on 10/07/13 with increasing SOB, hypoglycemia, confusion with decrease in urine output as well as acute on chronic renal failure with BUN 112/ Cr-3.3. Dr Allena Katz consulted for input and renal failure and felt that patient with uremia causing confusion and poor likelyhood or renal recovery.  Dr. Tyrone Sage consulted for input on large pericardial effusion and surgical intervention recommended. . Patient underwent subxiphoid pericardial window and drainage of pericardial effusion on 10/09/13. Hemodialysis  initiated 11/26 due to fluid overload and poor renal recovery. Plans were made for thigh AVG to be placed on 10/18/13. Cardiology recommended coumadin due to onset of A fib.  He was admitted to CIR on 11/09/2013 due to metabolic encephalopathy with deconditioning.  He was hypotensive requring fluid bolus just prior to admission with stabilization of blood pressure.  Anxiety was managed with use of prn xanax. Therapy evaluations done on 10/18/13 and patient was noted to be severely deconditioned with inability to stand. Speech therapy evaluation showed tangential speech but cognition was at baseline. He was transfused with 2 units PRBC during dialysis on 10/18/13 pm. Weight was down to 82.2 kg on 11/02/2013. He was taken to OR on 10/16/2013 for AVG procedure by Dr. Hart Rochester and tolerated this without difficulty. During evening rounds at 12 am on 11/12/2013, he was found to be unresponsive, pulseless with cyanotic lips and non-responsive pupils. Code blue initiated with ACLS protocol and blood tinged drainage from chest wound noted during compressions. Patient could not be resuscitated. He was pronounced dead on 11-12-13 at 1 am.    Signed: Jacquelynn Cree 10/22/2013, 4:25 PM

## 2013-10-22 NOTE — Telephone Encounter (Signed)
Erroneous encounter

## 2013-10-23 ENCOUNTER — Encounter (HOSPITAL_COMMUNITY): Payer: Self-pay | Admitting: Vascular Surgery

## 2013-11-15 NOTE — Procedures (Signed)
Intubation Procedure Note [name] [id number] Dec 20, 1945  Procedure: Intubation Indications: Respiratory insufficiency Patient in cardiac arest  Procedure Details Consent: Risks of procedure as well as the alternatives and risks of each were explained to the (patient/caregiver).  Consent for procedure obtained. and Unable to obtain consent because of emergent medical necessity. Time Out: Verified patient identification, verified procedure, site/side was marked, verified correct patient position, special equipment/implants available, medications/allergies/relevent history reviewed, required imaging and test results available.  Performed  Maximum sterile technique was used including gloves, gown, hand hygiene and mask.  MAC and 4    Evaluation Hemodynamic Status: *patient in cardiac arest**; O2 sats: currently acceptable Patient's Current Condition: unstable Complications: Patient has no heart rate and in not breathing at this time Patient did not tolerate procedure well. Chest X-ray ordered to verify placement.  CXR: Positive color change on EZ-CAP with good breath sounds. E.T tube size #7.5 secured at 23 at the lip.   Leafy Half 10/30/2013

## 2013-11-15 NOTE — Code Documentation (Signed)
CODE BLUE NOTE  Patient Name: [name]   MRN: [id number]   Date of Birth/ Sex: 1946/05/25 , male      Admission Date: 10/27/2013  Attending Provider: Ranelle Oyster, MD  Primary Diagnosis: deconditioned ESRD     Indication: [name] is a 68 y.o M with a significant past medical history of recent pericardial effusion s/p pericardial window (10/08/13), ESRD s/p L-thigh AV fistula, CAD h/o MI s/p ICD, CHF, HTN, T1DM, Pt was hospitalized for continued inpatient rehabilitation (CIR), and in his usual of health until this PM, when he was noted to be found unresponsive, not breathing, and without a pulse. Code blue was subsequently called. At the time of arrival on scene, ACLS protocol was underway.   Technical Description:  - CPR performance duration:  Approximately 30 minutes, with EtCO2 monitor avg 20-30  - Was defibrillation or cardioversion used? Yes   - Was external pacer placed? No  - Was patient intubated pre/post CPR? Yes    Medications Administered: Y = Yes; Blank = No Amiodarone  Y, 150 mg x 1  Atropine  N  Calcium  Y, 1 amp x 1  Epinephrine  Y, 1 mg x 5  Lidocaine  N  Magnesium  Y, 2g x 1  Norepinephrine  N  Phenylephrine  N  Sodium bicarbonate  Y, 1 amp x 2  Vasopressin  N  Other N   Additional events during code: Noted bleeding / drainage from old chest surgical site during chest compressions (from recent pericardial window surgery).  Labs: BMET (Na 139, K+ 6.8, CO2 17, BUN 24, Cr 2.86, Ca 10.4), Troponin I (1.76), LA (14.6), ABG  Post CPR evaluation:  - Final Status - Was patient successfully resuscitated ? No   Miscellaneous Information:  - Time of death:  0100  AM (Nov 04, 2013)  - Primary team notified?  Yes  - Family Notified? Yes       In attendance at code:  Lorretta Harp, MD - Internal Medicine, PGY-3  Saralyn Pilar, DO  - Family Medicine, PGY-1 11/04/13, 1:31 AM

## 2013-11-15 NOTE — Progress Notes (Signed)
Chaplain delivered grief support to the wife, son and neighbors of pt who died.  Chaplain gave compassion and care through conversation, by praying and listening reflectively and by escorting family to ED waiting area as they departed the hospital.   10/19/2013 0400  Clinical Encounter Type  Visited With Family  Visit Type Death  Referral From Nurse  Spiritual Encounters  Spiritual Needs Grief support  Stress Factors  Family Stress Factors Loss    [name]

## 2013-11-15 NOTE — Progress Notes (Signed)
Walked 12am rounds, found patient unresponsive. Assessed patient, no pulse, lips blue, eyes open with pupils unresponsive. Called Code Blue, initiated CPR. Code Blue team arrived at 0018 and took over CPR and continued life saving measures. Code ended at 0100, patient expired and family notified.

## 2013-11-15 DEATH — deceased

## 2013-11-20 LAB — AFB CULTURE WITH SMEAR (NOT AT ARMC): Acid Fast Smear: NONE SEEN

## 2014-01-08 ENCOUNTER — Telehealth: Payer: Self-pay | Admitting: *Deleted

## 2014-01-08 NOTE — Telephone Encounter (Signed)
[name] called and she is having problems with finalizing with insurance companies over the death certificate of her husband.  The initial death certificate is the one they are all using which had the cause of death as pending.  She needs a way to get the copy that has the cause of death listed.  She would very much like you to call her.  Home phone number is [phone] and mobile is [phone].

## 2014-01-08 NOTE — Telephone Encounter (Signed)
She would need to contact the medical examiner. I honestly have no way of getting her a "different" death certificate. If she would like we can forward the results from [employer] to [employer].

## 2014-06-04 IMAGING — CT CT KNEE*L* W/CM
3 of 4 series · 15 of 27 positions shown, 17 images · non-contrast
Comparison: Radiograph 05/10/2012.

CLINICAL DATA: Left knee pain with weakness.  Joint effusion.
Evaluate for meniscal tear.

CT LEFT KNEE WITH INTRAARTICULAR CONTRAST
TECHNIQUE: Multidetector CT imaging of the left knee was performed
according to the standard protocol following intraarticular
contrast administration. Multiplanar CT image reconstructions were
also generated.

[Series 2: knee bone · axial · 0.33mm/px · z∈[-137,+28]mm · 5 of 100 slices shown, 7 images]
[im 17/100  soft-tissue]
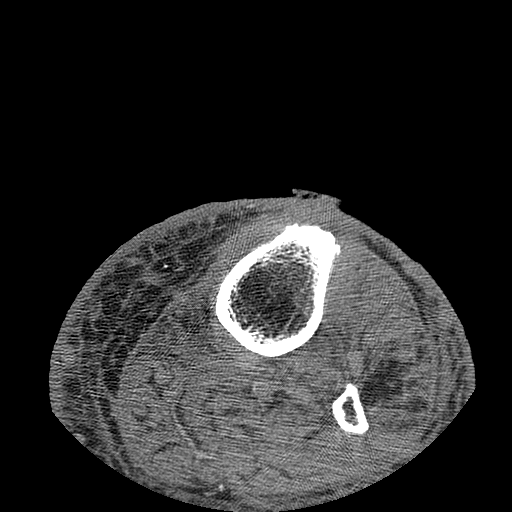
[im 17/100  bone]
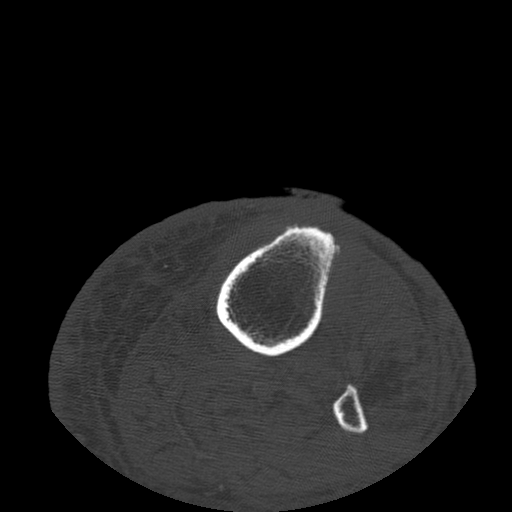
[im 34/100  bone]
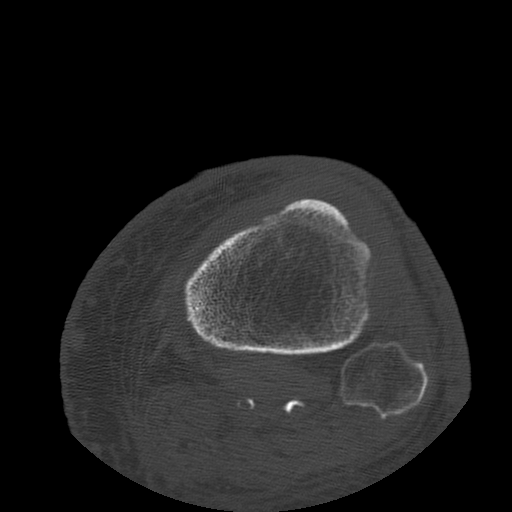
[im 50/100  bone]
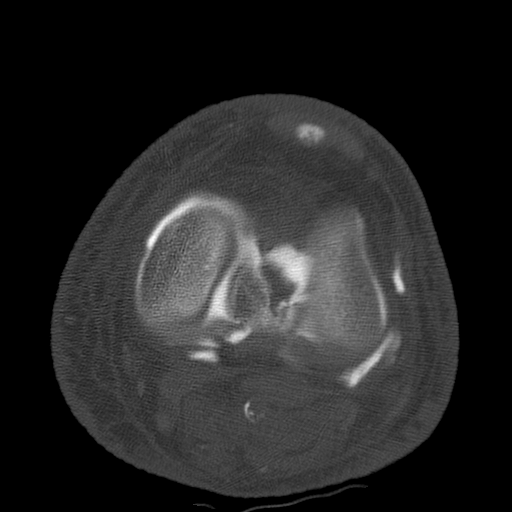
[im 67/100  bone]
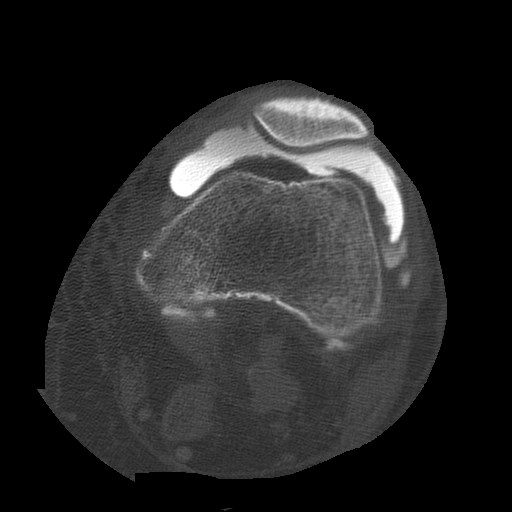
[im 83/100  soft-tissue]
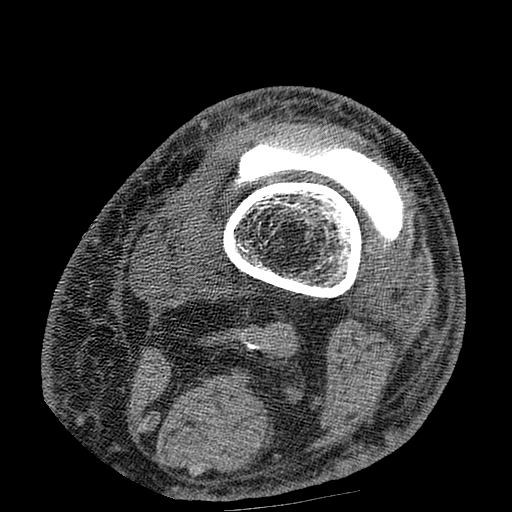
[im 83/100  bone]
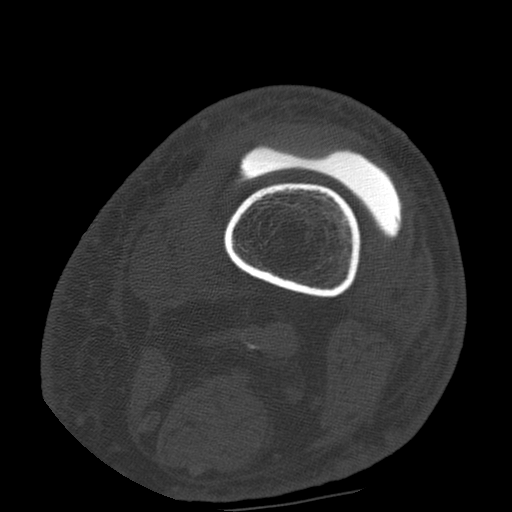

[Series 3: knee detail · axial · 0.33mm/px · z∈[-137,+28]mm · 5 of 100 slices shown]
[im 17/100  bone]
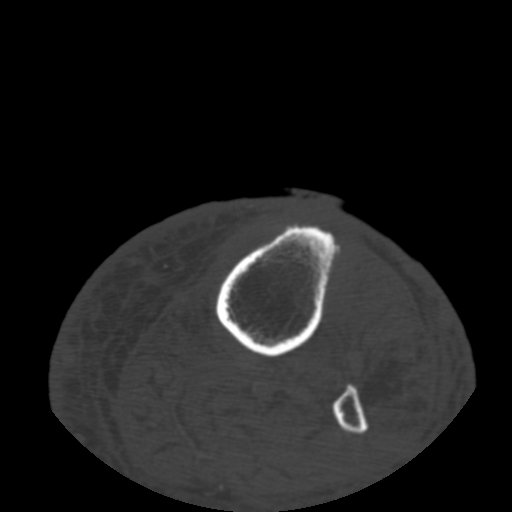
[im 34/100  bone]
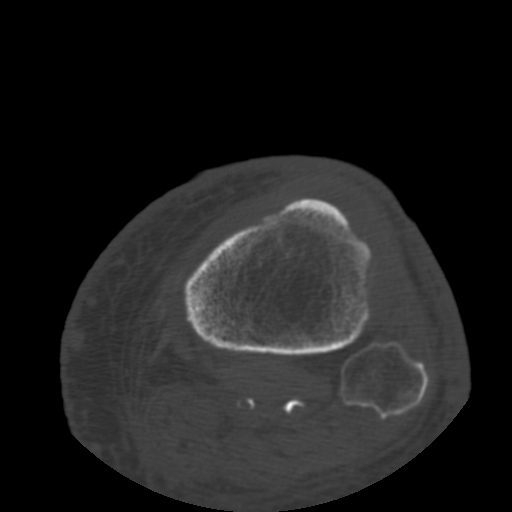
[im 50/100  bone]
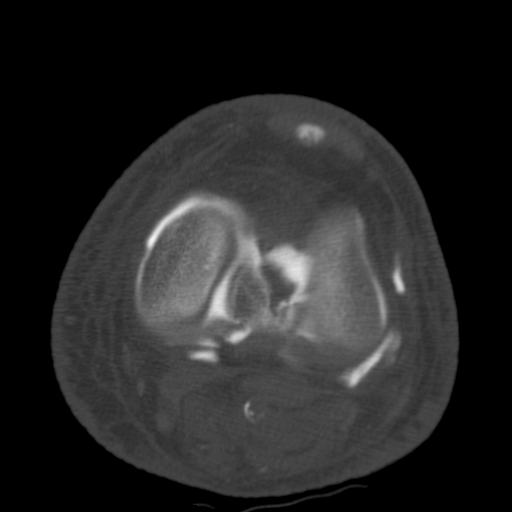
[im 67/100  bone]
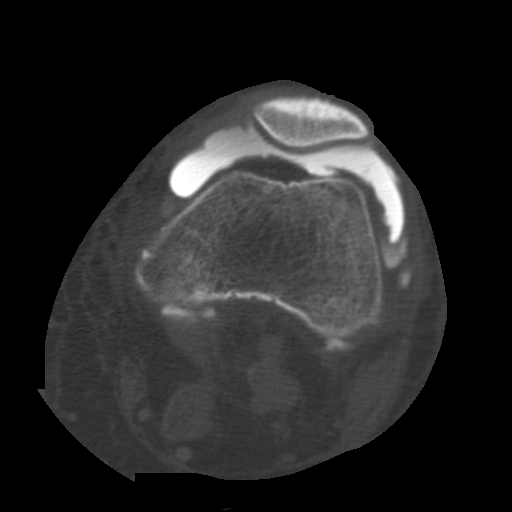
[im 83/100  bone]
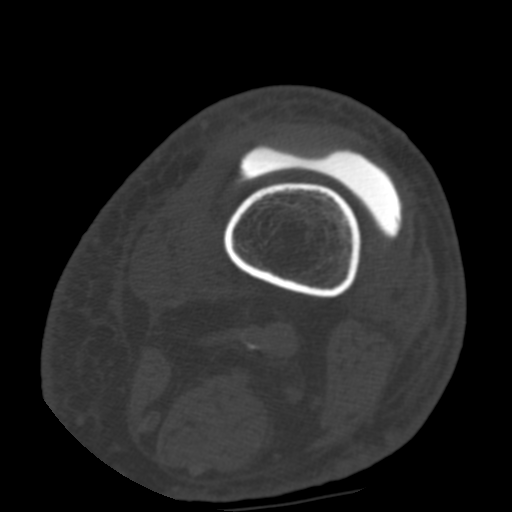

[Series 401: sag · sagittal · 0.49mm/px · 5 of 79 slices shown]
[im 14/79  bone]
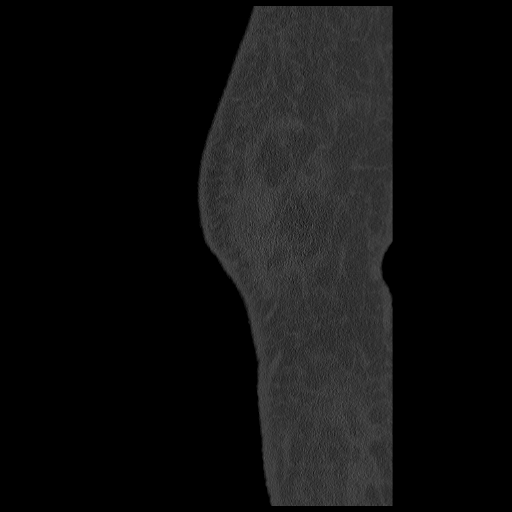
[im 27/79  bone]
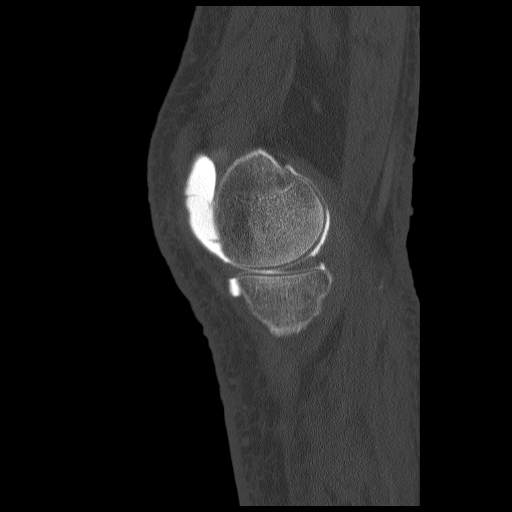
[im 40/79  bone]
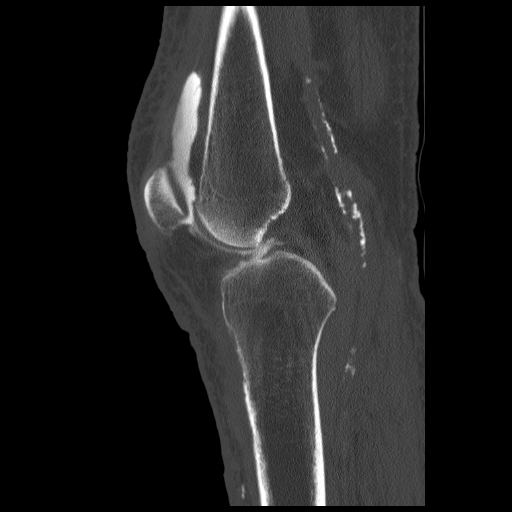
[im 53/79  bone]
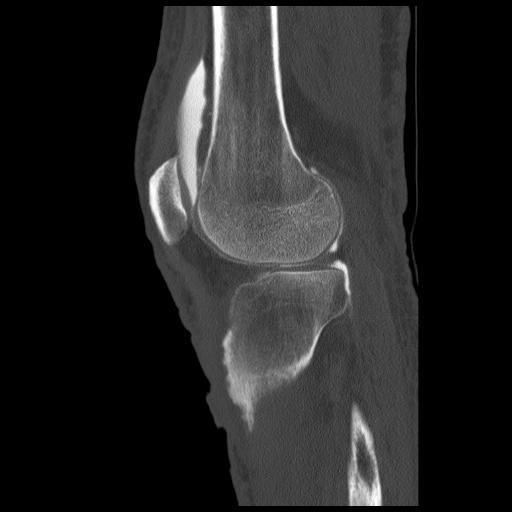
[im 66/79  bone]
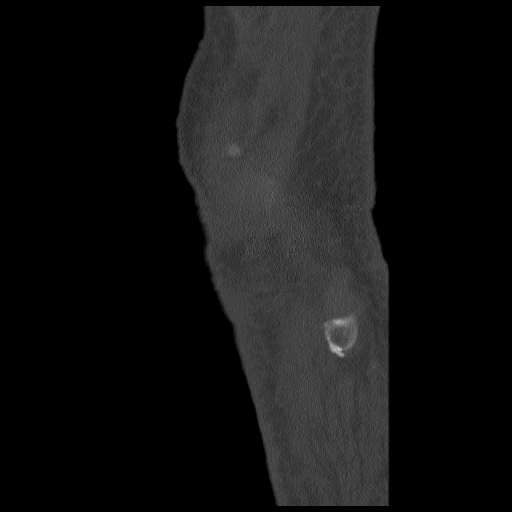

[15 of 27 positions shown; findings below may reference images not displayed]

FINDINGS: The joint is well distended with intra-articular
contrast.  The extensor mechanism and cruciate ligaments grossly
appear intact.  The menisci appear intact.  There are no focal
chondral defects.  Patellofemoral articular cartilage is preserved.
Trochlea appears normal.  Popliteal atherosclerosis is present.
Calcified phleboliths are present in the anterior subcutaneous
tissues of the leg.  Mild infiltration of the subcutaneous tissues
likely represents dependent edema.
IMPRESSION: Negative for meniscal tear.  No internal derangement of the knee
identified.  No focal chondral defects.  Normal CT arthrogram.

## 2014-06-04 IMAGING — RF DG FLUORO GUIDE NDL PLC/BX
5 series · 5 of 5 positions shown · IV contrast (omnipaque)
Comparison: None.

CLINICAL DATA: Left knee pain.

ARTHROCENTESIS/INJECTION OF LARGE JOINT
TECHNIQUE: The knee joint was localized on the lateral approach at
the superior aspect of the patella.  The skin was prepped and
draped in usual sterile fashion.  The superficial soft tissues were
anesthetized with 1%  lidocaine.  I then advanced a 22 gauge spinal
needle into the knee joint under direct fluoroscopic observation.
30 ml of a mixture of Omnipaque 180 and lidocaine was then
administered.
Fluoroscopy Time: 46 seconds minutes.
Contrast: 30 ml of a 75% mixture of Omnipaque 180 and 1% lidocaine

[Series 1: (hospital) · 1 of 1 slices shown (1 of 5)]
[im 1/1]
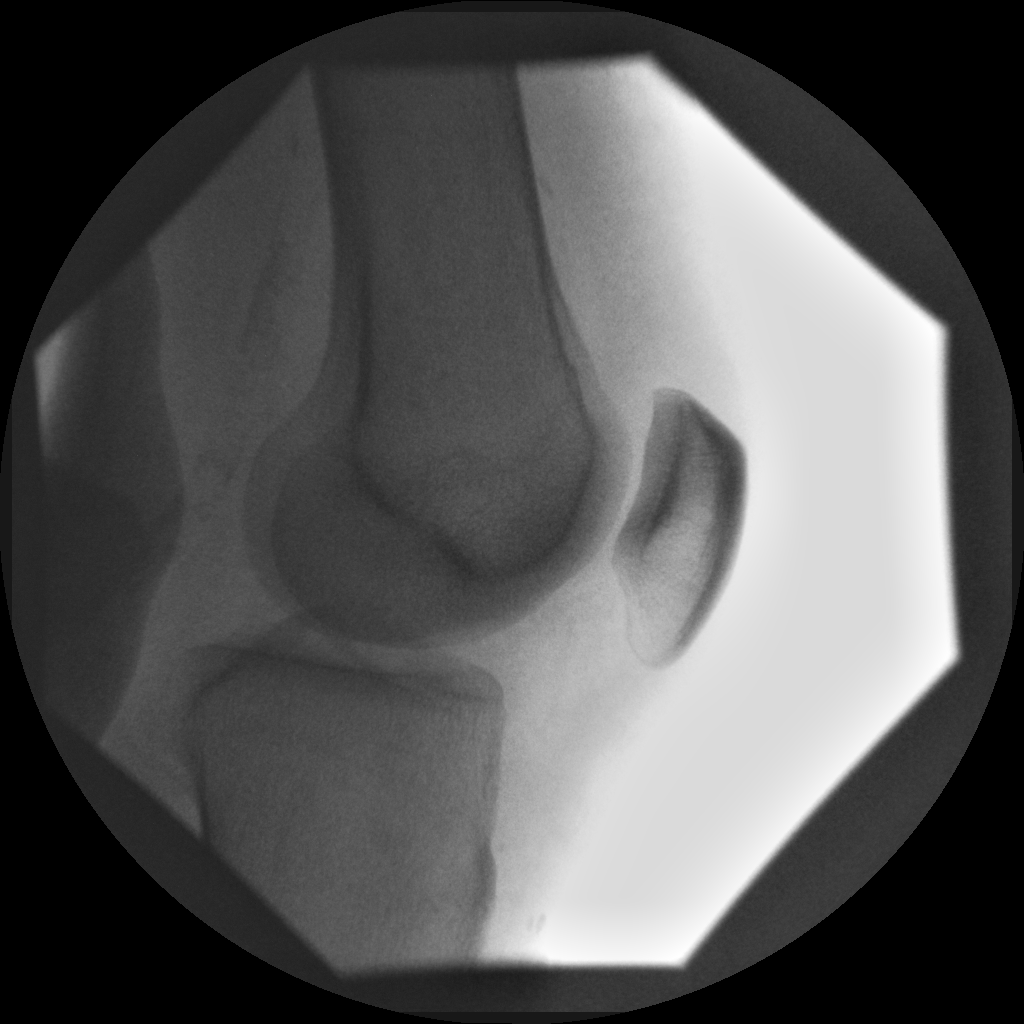

[Series 2: (hospital) · 1 of 1 slices shown (2 of 5)]
[im 1/1]
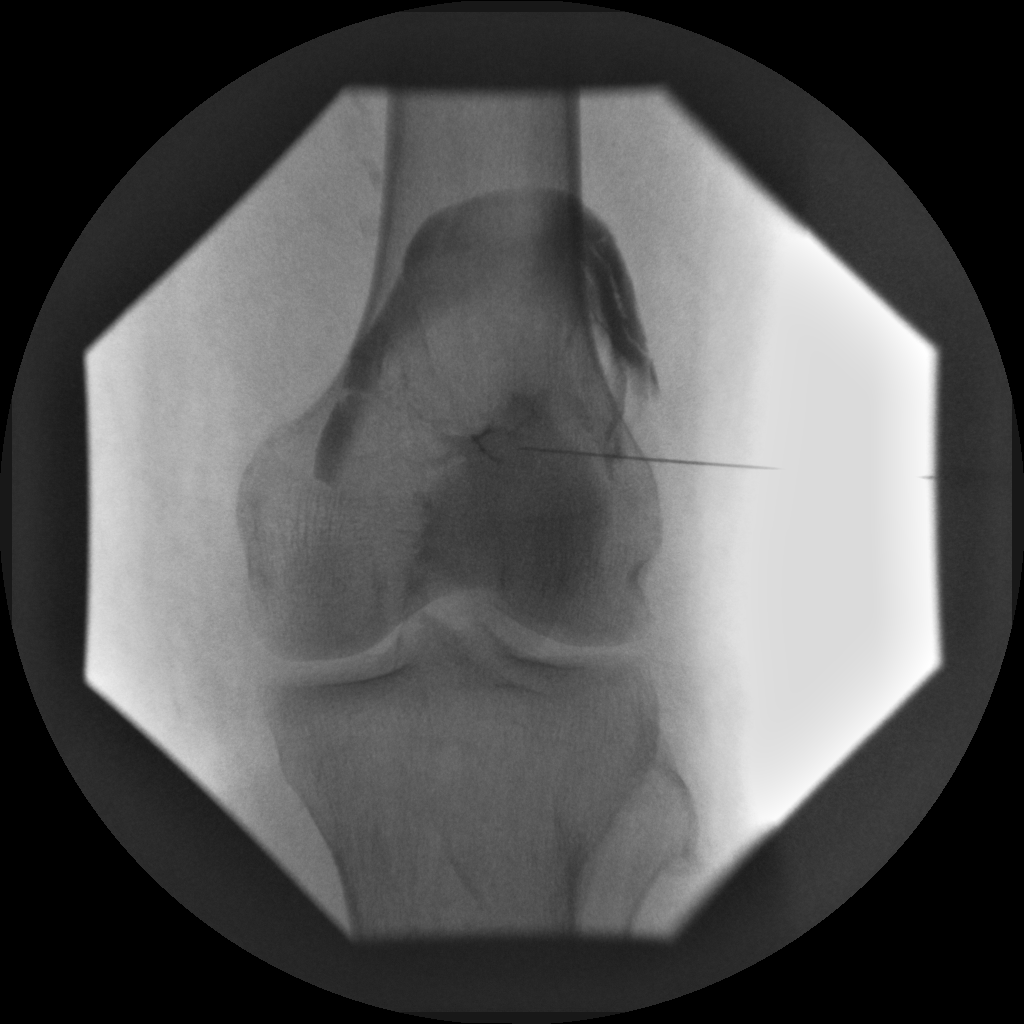

[Series 3: (hospital) · 1 of 1 slices shown (3 of 5)]
[im 1/1]
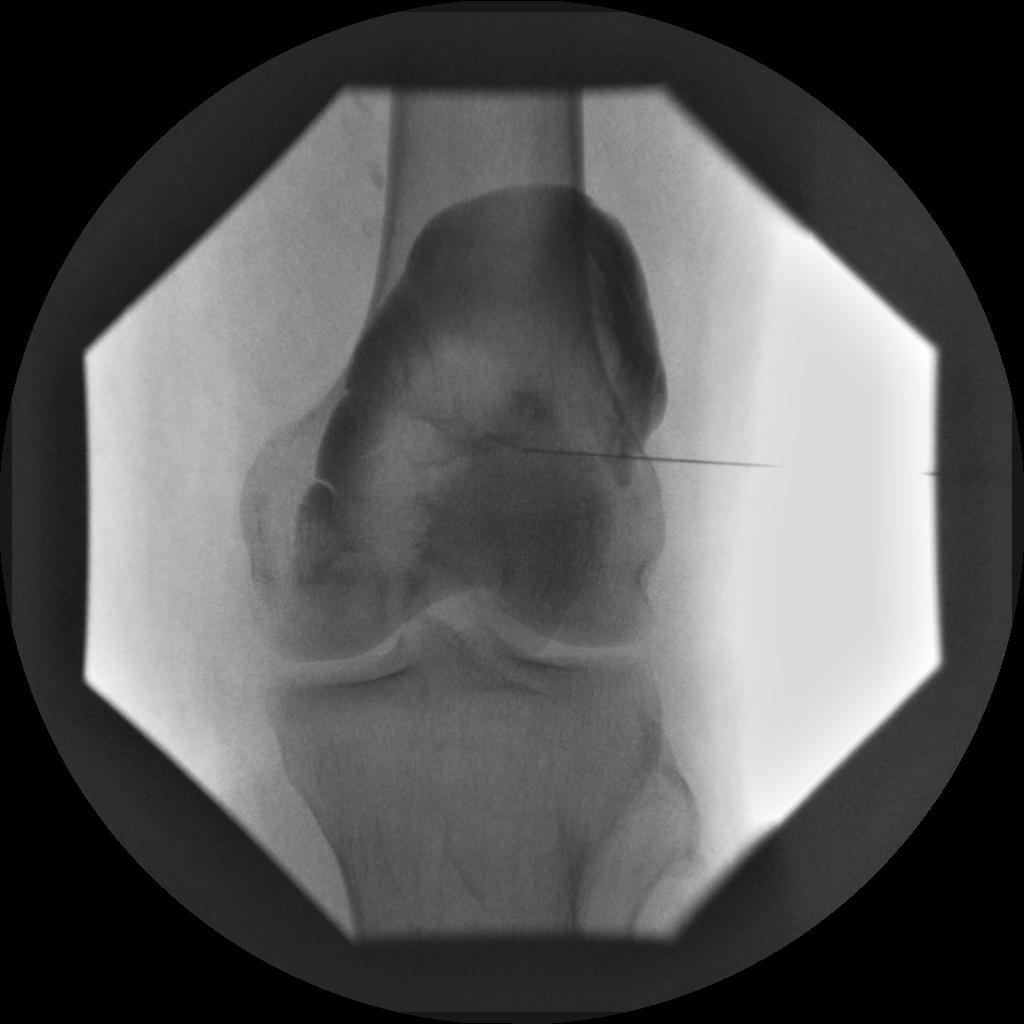

[Series 4: (hospital) · 1 of 1 slices shown (4 of 5)]
[im 1/1]
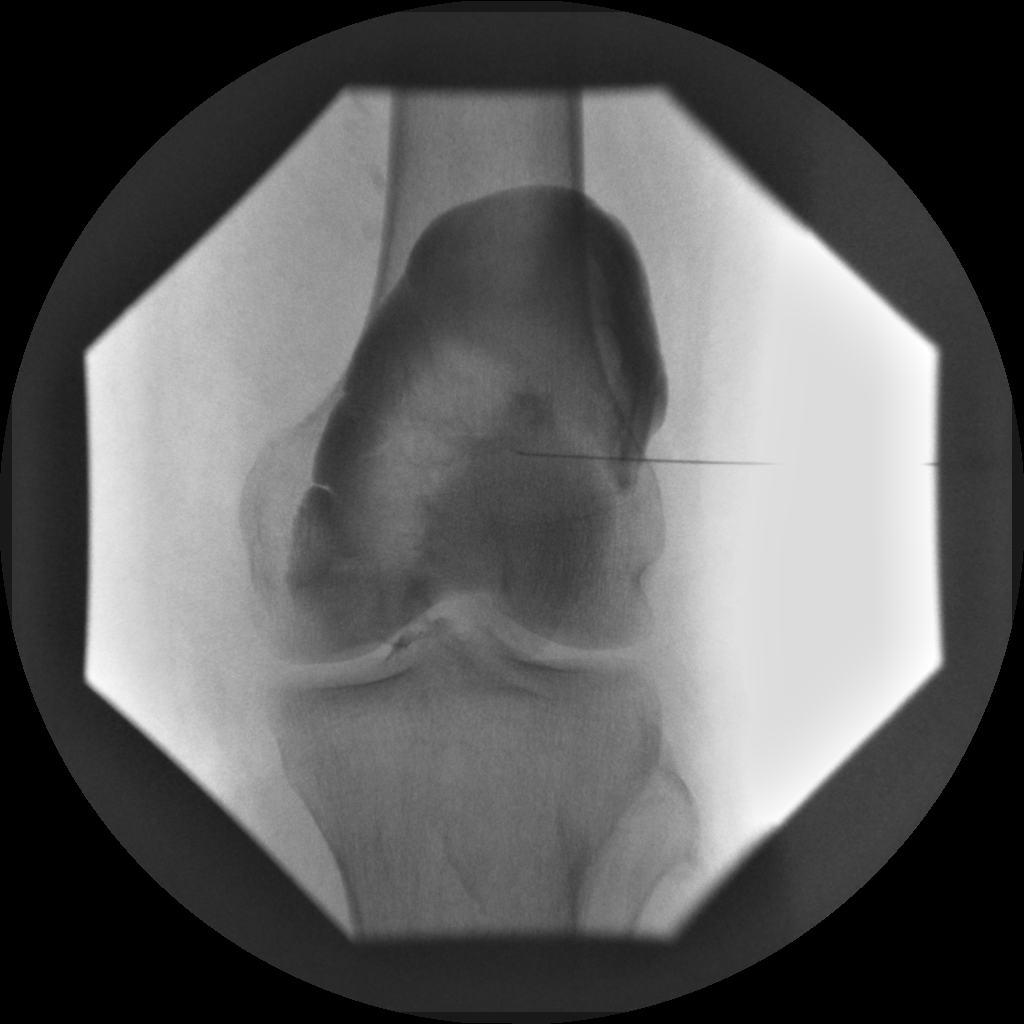

[Series 5: (hospital) · 1 of 1 slices shown (5 of 5)]
[im 1/1]
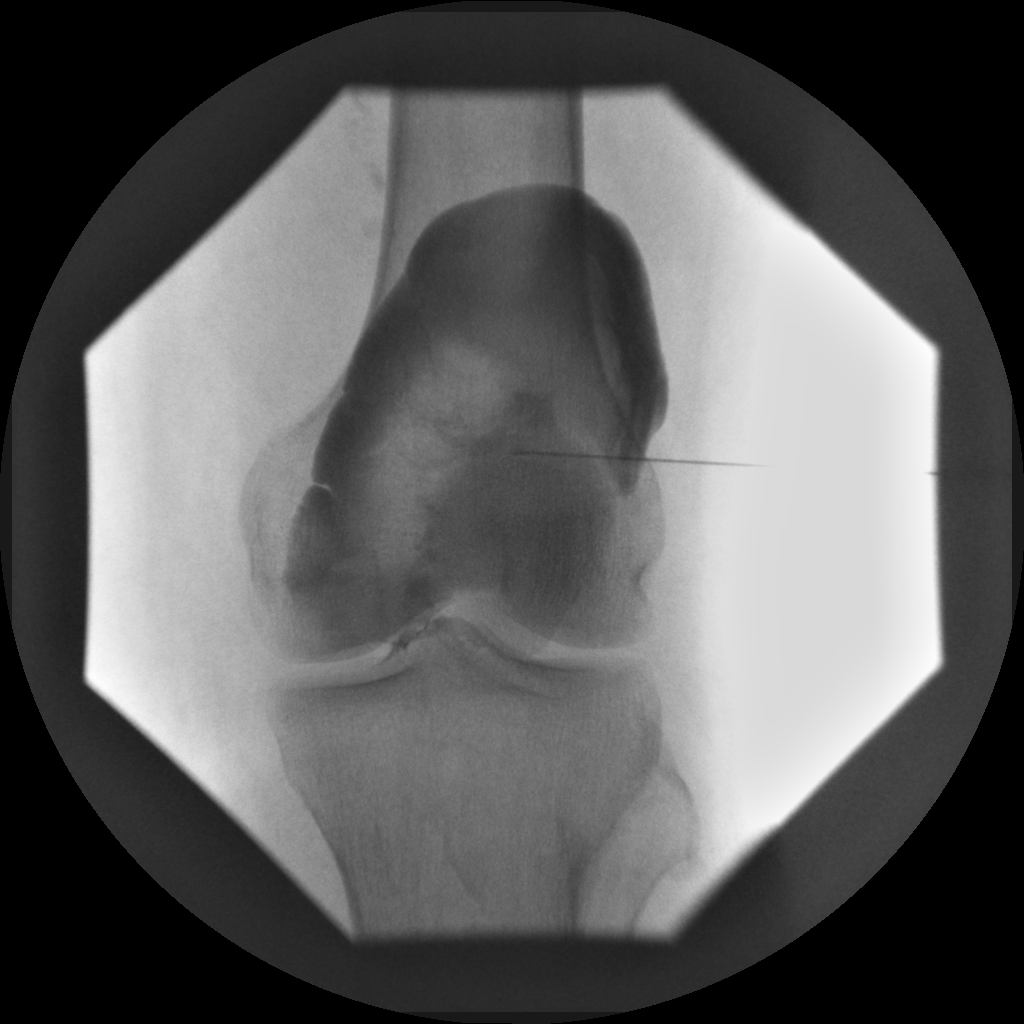

[5 of 5 positions shown; findings below may reference images not displayed]

FINDINGS: The knee joint was opacified.
IMPRESSION: Technically successful fluoroscopic guided needle placement for
injection of the left knee capsule for CT arthrography.

## 2015-02-16 IMAGING — US US EXTREM UP VENOUS*L*
1 series · 14 of 24 positions shown · non-contrast
Comparison: none

REASON FOR EXAM: swelling erythema, eval dvt vs abscess
COMMENTS:

PROCEDURE:     US  - US DOPPLER UP EXTR LEFT  - August 30, 2013  [DATE]
RESULT:     History: Swelling. Or Graf comparison study: No prior.

[Series 1: us extrem up venous*left* · 0.07mm/px · 14 of 35 slices shown]
[im 1/35]
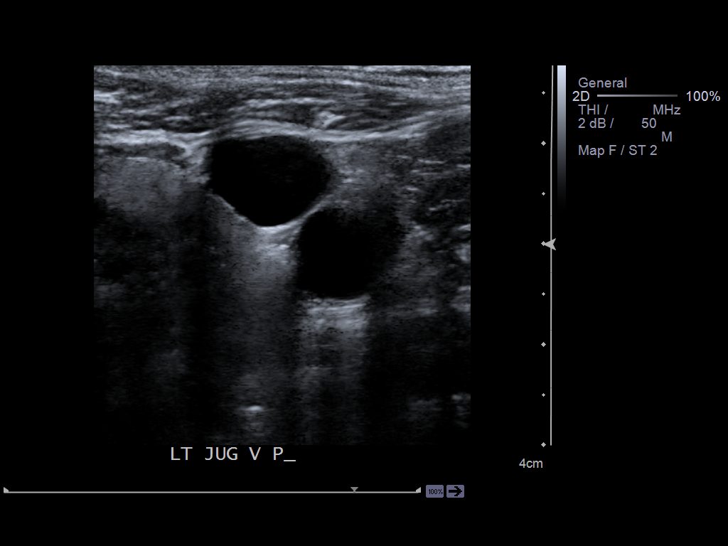
[im 3/35]
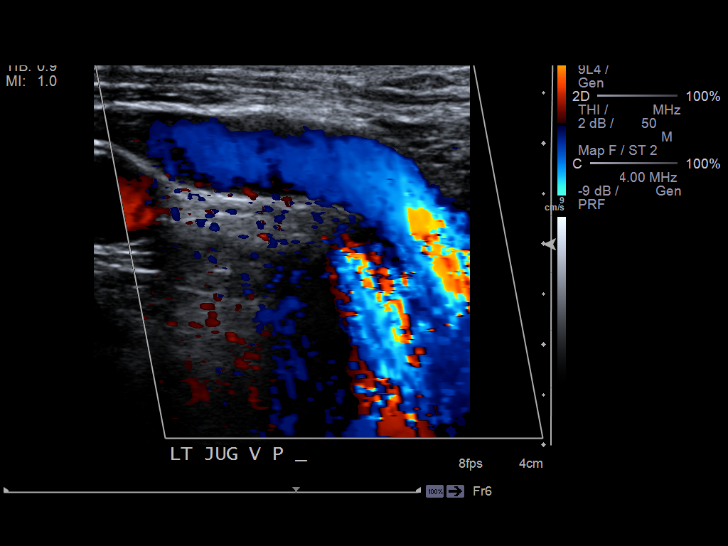
[im 6/35]
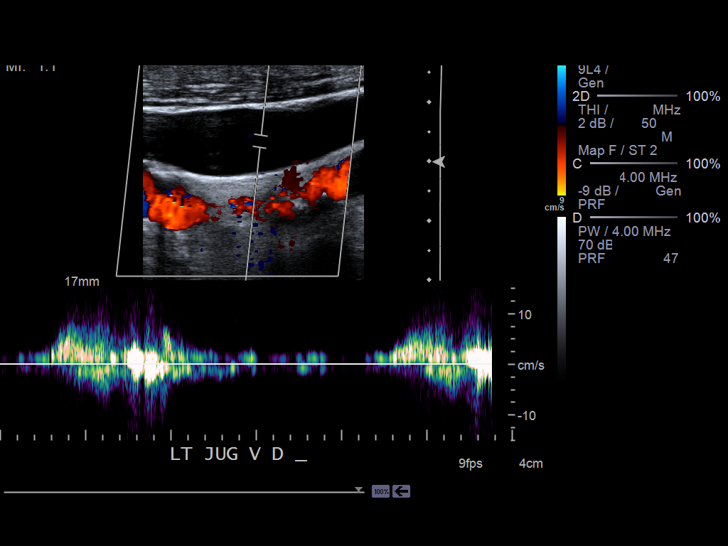
[im 9/35]
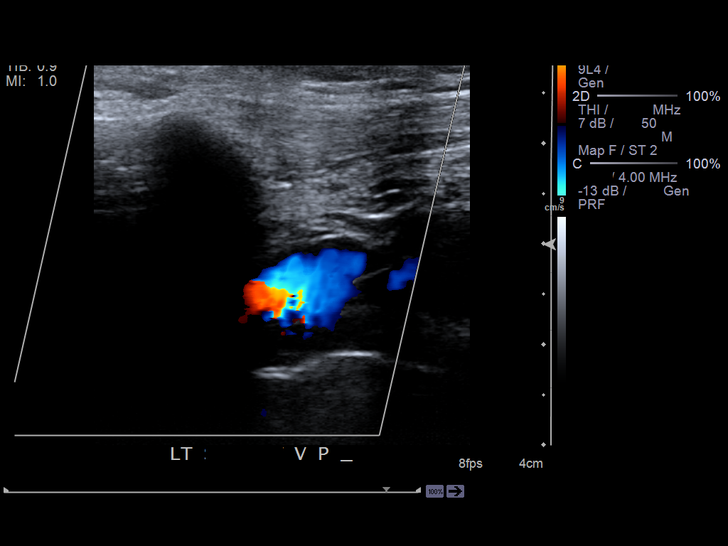
[im 11/35]
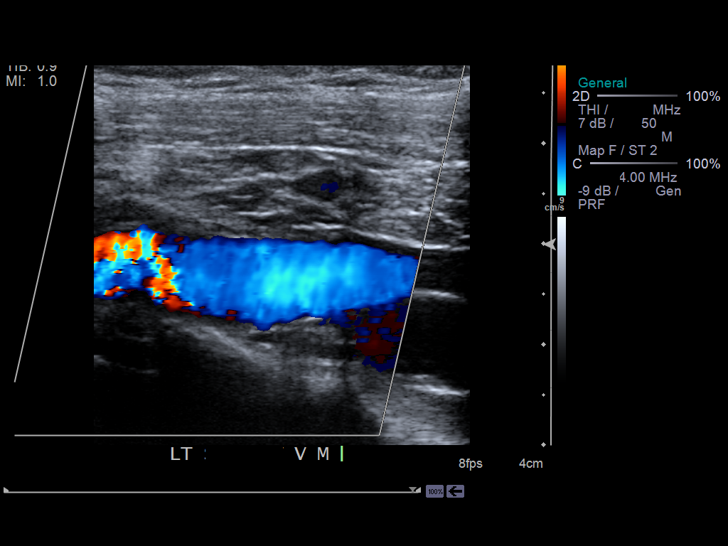
[im 14/35]
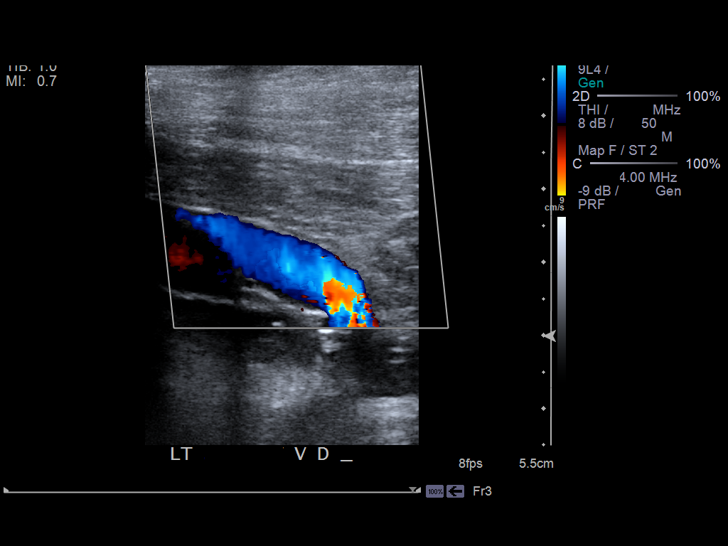
[im 17/35]
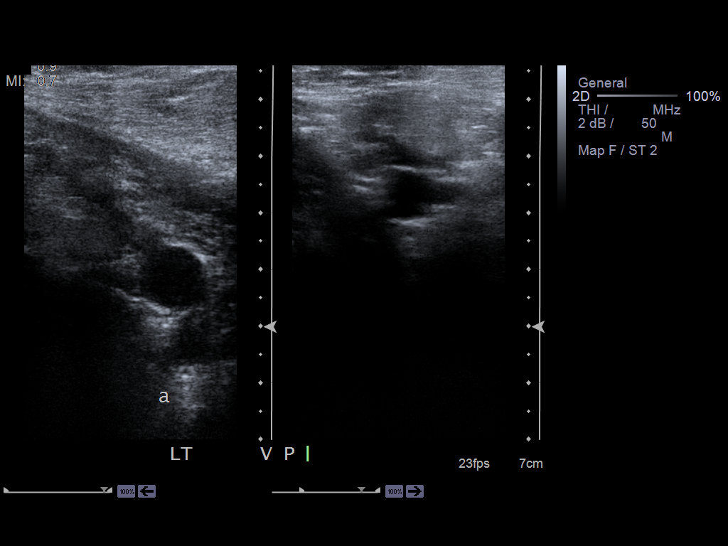
[im 18/35]
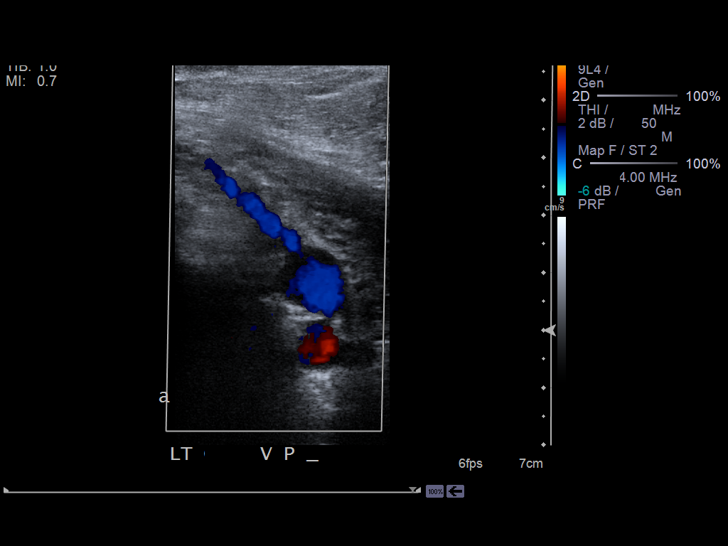
[im 21/35]
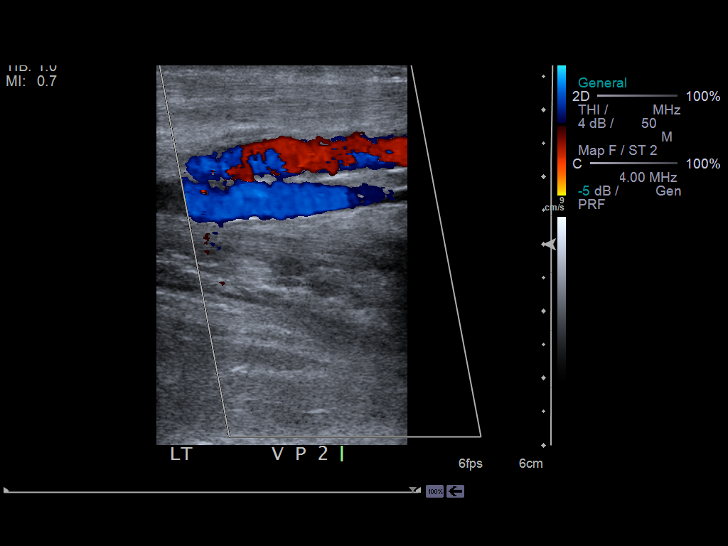
[im 24/35]
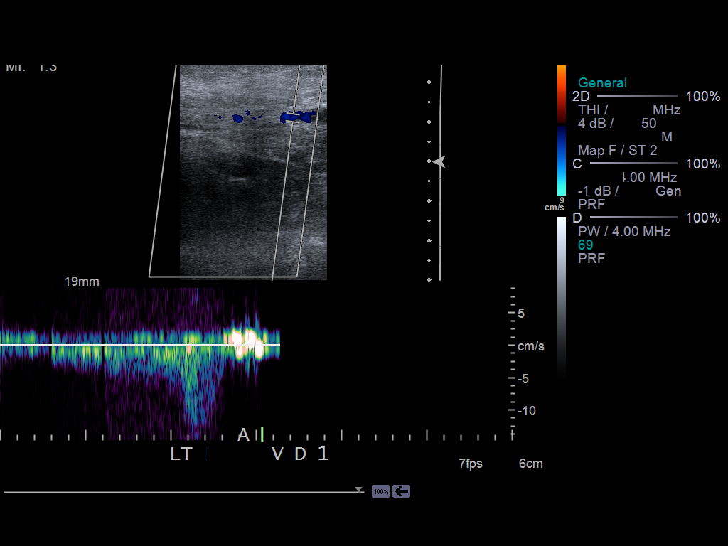
[im 27/35]
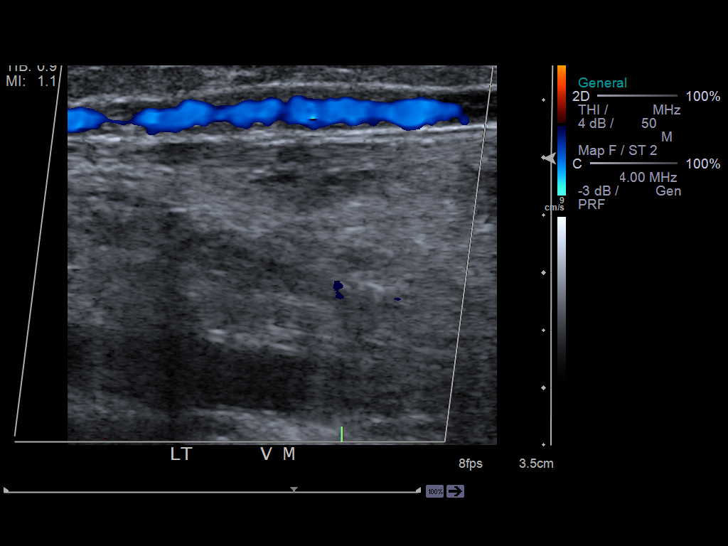
[im 29/35]
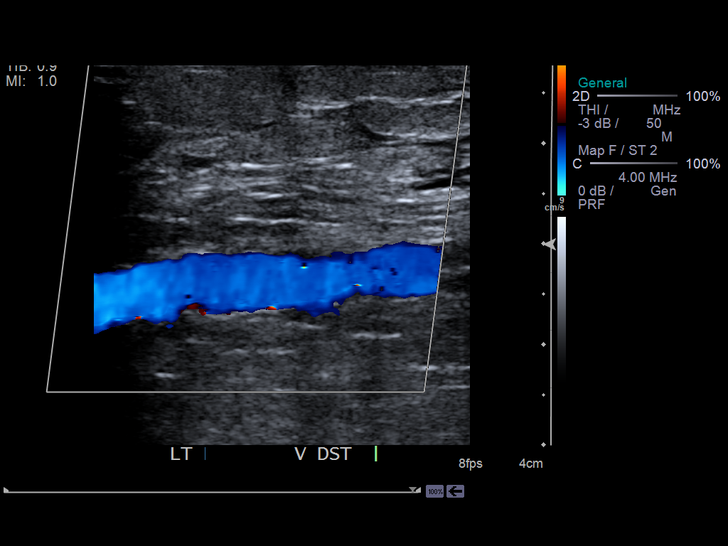
[im 32/35]
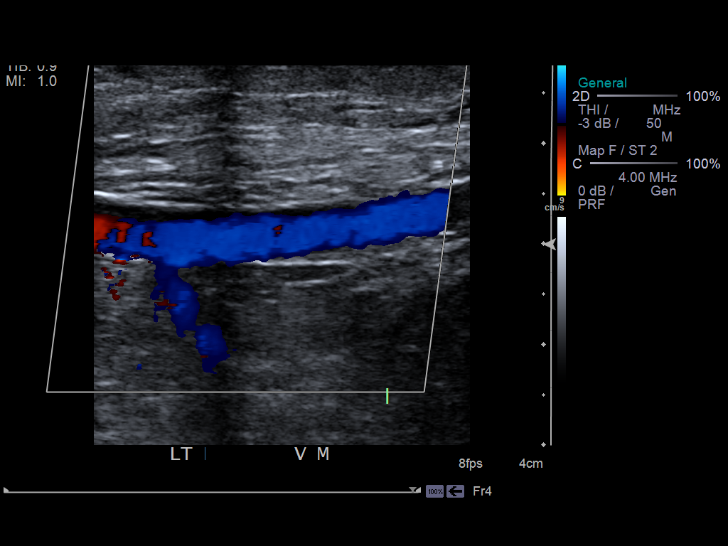
[im 35/35]
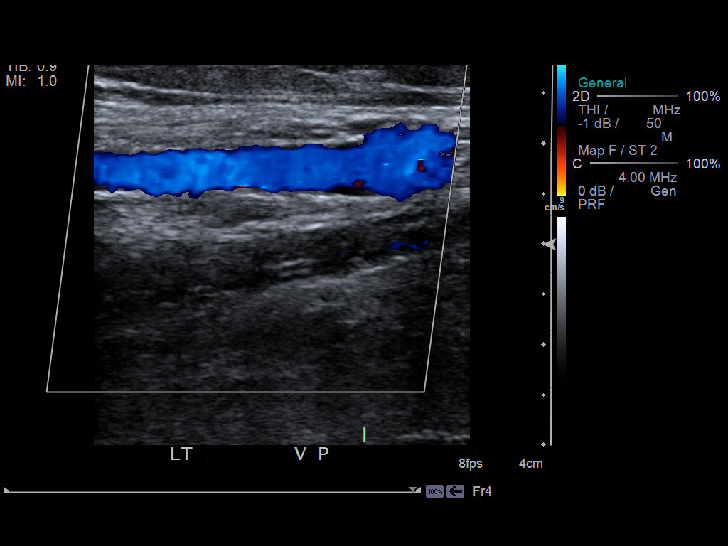

[14 of 24 positions shown; findings below may reference images not displayed]

FINDINGS: Left upper extremity color flow duplex Doppler obtained. No deep
venous thrombosis.
IMPRESSION: Negative exam.

## 2015-03-07 NOTE — Discharge Summary (Signed)
PATIENT NAME:  [name], [name] MR#:  [id number] DATE OF BIRTH:  13-Feb-1946  DATE OF ADMISSION:  08/30/2013 DATE OF DISCHARGE:  08/31/2013  ADMITTING PHYSICIAN:  Dr. Vivien Presto.  DISCHARGING PHYSICIAN:  Gladstone Lighter, M.D.   PRIMARY CARE PHYSICIAN: Nonlocal in [place].  PRIMARY NEPHROLOGIST:  [facility] in [place].   DISCHARGE DIAGNOSES:  1.  Left forearm cellulitis.  2.  Acute renal failure.  3.  Chronic kidney disease with baseline creatinine around 2.4.  4.  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  5.  Cardiomyopathy and congestive heart failure with a low ejection fraction. The patient is status post defibrillator placement.  6.  Diabetes mellitus.  7.  Peripheral neuropathy.  8.  Chronic back pain and neuropathy secondary to back surgeries.  9.  Squamous cell carcinoma of the skin.   HOME MEDICATIONS, MEDICATIONS AT THE TIME OF DISCHARGE:  1.  Prednisone 5 mg p.o. daily.  2.  Cellcept 1 tablet p.o. b.i.d.  3.  Fish oil 1 capsule p.o. daily. 4.  Gabapentin 100 mg p.o. 3 times, one capsule in the morning and 300 mg at bedtime.  5.  Levemir 40 units subcutaneous once a day.  6.  Lipitor 40 mg p.o. daily.  7.  Multivitamin 1 tablet p.o. daily.  8.  Coreg 12 mg p.o. b.i.d.  9.  Aspirin 81 mg p.o. daily.  10.  Xanax 0.2 mg p.o. b.i.d.  11.  Percocet 5 mg, 5/325 mg 1 to 2 tablets every 6 hours p.r.n. for pain.  12.  Vitamin C 500 mg p.o. daily.  13.  Ranitidine 300 mg p.o. daily.  14.  NovoLog Flex Pen sliding scale insulin.  15.  Sublingual nitroglycerin 0.4 mg p.r.n. for chest pain.  16.  The patient was on Zyvox and Zosyn while in the hospital.  LABORATORY AND IMAGING STUDIES PRIOR TO DISCHARGE:  WBC 10.3, hemoglobin 7.9, hematocrit 24.6, platelet count 111.   Sodium 140, potassium 4.8, chloride 107, bicarb 27, BUN 70, creatinine 3.04, glucose 79 and calcium of 8.6. Magnesium 1.3, hemoglobin A1c is 5.9,  LDL 45, HDL 37, total  cholesterol 97, triglycerides 77. Urinalysis negative for any infection but has greater than 500 mg/dL of protein. Ultrasound Doppler of the left upper extremity showing no evidence of any DVT. Blood cultures are pending.   BRIEF HOSPITAL COURSE:  [name] is a 69 year old Caucasian male with a past medical history significant for a history of diabetes, nephritis, status post renal transplant about 40 years ago, following at [facility] and recent moved to [place] area and has an appointment to see nephrologist at [facility], and also has dry skin and had a recent skin biopsy done for a possible squamous cell carcinoma for his forearm, comes to the hospital secondary to worsening left forearm swelling, erythema and inability to move the arm.   1.  Left forearm cellulitis likely triggered after his excision skin biopsy. The patient has extremely flaky, dry skin, was covered by Zyvox and also on Zosyn. Vanc was not used because of his underlying kidney dysfunction. Antibiotics were renally dosed. Cultures are pending at this time, and Doppler of that arm is negative for any DVT.  2.  Acute renal failure on chronic kidney disease. The patient is status post renal transplant about 40 years ago for nephritis in the past and was following up with a nephrologist at [facility] and recently moved over to the area and has an appointment  set up with [facility]. Since there is no transplant team here in the hospital, the patient expressed concern and wanted to be transferred over to [facility] so that his nephrologist can follow him. He was concerned about his acute renal failure and did not want his transplant to loss the functions since he has been doing so good for 40 years now. His baseline creatinine, according to him, use to be around 1.2 to 2. Lately it has gone up to 2.4 after his back surgery and hip surgery and hospitalizations. But now since it is up to 3, the patient  was worried and requested for transfer, which is appropriate at this time. His Lasix was held. There were no other nephrotoxins were started while in the hospital. Continue with immunosuppressants, prednisone and sulfa at home dose. [facility] was contacted. Dr. Eliseo Squires, was kind enough to accept the patient, and the patient is being transferred to [facility] at this time.  3.  Cardiomyopathy, coronary artery disease. The patient is status post defibrillator placement, unknown ejection fraction. No prior records available at this time. Continue aspirin. Coreg at this time. The Lasix is on hold. He is also on Lipitor.  .  4.  The course has been otherwise uneventful in the hospital.   DISCHARGE CONDITION:  Stable.   DISCHARGE DISPOSITION:  To [place] DISCHARGE:  35 minutes.  ____________________________ Gladstone Lighter, MD rk:jm D: 08/31/2013 12:54:12 ET T: 08/31/2013 13:15:55 ET JOB#: [id number]  cc: Gladstone Lighter, MD, <Dictator> [place] Gladstone Lighter MD ELECTRONICALLY SIGNED 09/01/2013 12:17

## 2015-03-07 NOTE — Op Note (Signed)
PATIENT NAME:  [name], [name] MR#:  [id number] DATE OF BIRTH:  06/24/1946  DATE OF PROCEDURE:  11/25/2012  PREOPERATIVE DIAGNOSIS: Displaced femoral neck fracture, right hip.   POSTOPERATIVE DIAGNOSIS: Displaced femoral neck fracture, right hip.   PROCEDURE: Cemented hemiarthroplasty, right hip.   SURGEON: [name]. [name] Hanif, MD   ASSISTANT: None.   ANESTHESIA: Spinal.   ESTIMATED BLOOD LOSS: 75 mL.   REPLACEMENT: 600 mL crystalloid.   DRAINS: One Hemovac.   COMPLICATIONS: None.   BRIEF CLINICAL NOTE AND PATHOLOGY: The patient fell and suffered the above-mentioned fracture after tripping over his right foot, which was weak secondary to a foot drop from a presumed herniated disk. He was brought to the Emergency Room and evaluated, x-rays revealed the displaced femoral neck fracture. Options, risks, and benefits were discussed, and the patient elected to proceed with operative intervention. At the time of the procedure, the patient's bone was of good quality. There was no acetabular damage. Fixation was felt to be excellent. It was felt that leg length was restored.   DESCRIPTION OF PROCEDURE: Preop antibiotics, adequate spinal anesthesia, left lateral decubitus position with hip grips being used. Routine prepping and draping. Appropriate timeout was called.   The routine posterior approach was made. The fascia was opened in line with the incision, and a self-retaining retractor was placed after the sciatic nerve was identified and protected. Piriformis was tagged and reflected. The capsule was opened in a T-shaped fashion. The proximal femur was delivered. The incision was thoroughly irrigated throughout the procedure.   The soft tissue was removed from the piriformis area, a cookie cutter was used, and the canal was entered. Tapered reaming progressed. Care was taken to lateralize the reamer. Broaching was then performed, maintaining anteversion, and calcar planing performed after a neck  cut. It was felt that based on preoperative x-rays that the neck length was satisfactory.   The femoral head was then removed. Sizing was performed. The femoral canal was then thoroughly irrigated. The cement restrictor was placed. The canal was thoroughly dried and a Neo-Synephrine-soaked sponge placed.   This was then removed and a dry sponge placed while the cement was mixed. The cement was then placed distal to proximal and pressurized. The component was inserted to maintain appropriate anteversion. It seated very nicely. Pressure was maintained while excess cement was removed.   The acetabulum was thoroughly inspected and thoroughly irrigated. The head and neck were then placed. The hip was reduced and was extremely stable. The piriformis was repaired with #5 Ticron. The fascia was closed with #2 quill. A Hemovac drain was placed deep to the fascia. A second one was placed on top of the fascia, and the subcutaneous was closed with 0 quill and staples. Soft sterile dressing was applied. Sponge and needle counts were reported as correct prior to and after wound closure. The patient was taken to the PACU having tolerated the procedure well.   IMPLANTS USED:  Stryker Leeton #7 stem, 52 mm Unitrax head, #5 cement plug, +5 neck.  ____________________________ [name]. [name] Pole, MD jcc:cb D: 11/25/2012 12:56:29 ET T: 11/26/2012 16:05:54 ET JOB#: [id number]  cc: [name]. [name] Pole, MD, <Dictator> [name] [name] Putt MD ELECTRONICALLY SIGNED 01/11/2013 5:29

## 2015-03-07 NOTE — Discharge Summary (Signed)
PATIENT NAME:  [name], [name] MR#:  [id number] DATE OF BIRTH:  1946/07/18  DATE OF ADMISSION:  11/24/2012 DATE OF DISCHARGE:  11/30/2012  ADDENDUM: The patient was initially scheduled to be discharged on the 15th. However, the insurance company could not come to an agreement with the rehab facility on a price issue. Subsequently, he had to be kept overnight. There were no medical issues that complicated the situation. He is being discharged to [facility] in improved stable condition. ____________________________ Vance Peper, PA jrw:sb D: 11/30/2012 07:52:00 ET T: 11/30/2012 08:22:06 ET JOB#: [id number]  cc: Vance Peper, PA, <Dictator> [place] PA ELECTRONICALLY SIGNED 11/30/2012 13:30

## 2015-03-07 NOTE — Consult Note (Signed)
PATIENT NAME:  [name], [name] MR#:  [id number] DATE OF BIRTH:  [birth year]  DATE OF CONSULTATION:  11/24/2012  REFERRING PHYSICIAN:  Dr. Mauri Pole.  CONSULTING PHYSICIAN:  Clovis Pu. Lenore Manner, MD  PRIMARY CARE PHYSICIAN: Dr. Truman Hayward is [place]. His cardiologist is in [place] as well.   CHIEF COMPLAINT: Right femoral neck fracture status post fall.   HISTORY OF PRESENT ILLNESS: The patient is a 69 year old Caucasian male, who was in his usual state of health until today while he was walking to the bathroom. He forgot to use his cane and his right leg tangled with the left leg. He sustained a fall on the right side, resulting in right hip femoral neck fracture. He also had contusion to the right side of the chest and the right skull area. The patient was admitted for further evaluation and management of his hip fracture. The patient denies having any recent chest pain. No shortness of breath. No GI or genitourinary complaints. No fever. No chills. No cough.   REVIEW OF SYSTEMS:   CONSTITUTIONAL: Denies any fever. No chills. No fatigue.  EYES: No blurring of vision. No double vision.  EARS, NOSE, THROAT: No hearing impairment. No sore throat. No dysphagia.  CARDIOVASCULAR: No chest pain. No shortness of breath, but he feels a little sore on the right side of the chest from the fall. He has peripheral edema that is mild on and off for which he takes Lasix p.r.n. if needed. He has no true syncope.  RESPIRATORY: No shortness of breath. No chest pain. No cough. No hemoptysis.  GASTROINTESTINAL: No abdominal pain. No vomiting. No diarrhea.  GENITOURINARY: No dysuria. No frequency of urination.  MUSCULOSKELETAL: No joint pain other than the right hip pain and the contusion on the right side of the chest. No joint swelling or pain. No muscular pain or swelling.  INTEGUMENTARY: No skin rash, but he has multiple skin lesions, hyperkeratotic, all over his body. These are old and chronic. No recent ulcer.  NEUROLOGY:  No focal weakness other than right foot drop, which is recent after of having sciatica about a few weeks ago. The patient was placed on Neurontin for numbness for right leg, part of his sciatica and low-back pain. No seizure activity. No headache. PSYCHIATRY: No anxiety. No depression.  ENDOCRINE: No polyuria or polydipsia. No heat or cold intolerance.  HEMATOLOGY: No easy bruisability. No lymph node enlargement.   PAST MEDICAL HISTORY: History of coronary artery disease status post myocardial infarction in 1991. The patient had a recent cardiac work-up by his cardiologist in [place] and he was told that was reassuring. That was about a week ago. About six months ago, he also had an echocardiogram and stress test, and these were reported to be good. The patient also has history of pacemaker insertion, diabetes mellitus type 2, on insulin. His last hemoglobin A1c was 6.4. The patient also has a remote history of nephritis. He is status post kidney transplant about 39 years ago. The patient had recent sciatica with pain radiating from the lower back area down to the right leg, associated with numbness and also developed right foot drop. He was treated with Neurontin and he was given a cane for ambulation.   PAST SURGICAL HISTORY: Pacemaker insertion and kidney transplant.   FAMILY HISTORY: His great grandfather had nephritis. His mother and his brother both have diabetes mellitus.   SOCIAL HABITS: Nonsmoker. No history of alcohol or drug abuse.   SOCIAL HISTORY: He is married, living with  his wife.   ADMISSION MEDICATIONS: Acetaminophen with oxycodone 325-5, 1 q.8 hours p.r.n. for pain, alprazolam 0.25 mg q.6 hours p.r.n. for anxiety, aspirin 81 mg a day, carvedilol 25 mg twice a day, furosemide 40 mg once a day if he has peripheral edema, gabapentin 300 mg at night, Levemir 40 units at night, mycophenolate mofetil 500 mg tablet twice a day, NovoLog 6 units before each meal and also with a sliding  scale, prednisone 5 mg twice a day, ranitidine 300 mg once a day, simvastatin 80 mg once a day.   ALLERGIES: No known drug allergies.   PHYSICAL EXAMINATION:  VITAL SIGNS: Blood pressure 166/83, respiratory rate 18, pulse 86, temperature 98.4, oxygen saturation 95%.  GENERAL APPEARANCE: Elderly male lying in bed in no acute distress.  HEAD AND NECK: No pallor. No icterus. No cyanosis. EARS, NOSE, AND THROAT: Examination revealed normal hearing. No discharge. No lesions. Nasal mucosa was normal without discharge. No bleeding. No ulcers. Oropharyngeal area was normal without oral thrush. No exudates, no ulcers.  EYES: Normal iris. There is slight exophthalmos. Normal conjunctivae. Pupils are about 5 mm, round, equal, and reactive to light.  NECK: Supple. Trachea at midline. No thyromegaly. No cervical lymphadenopathies.  HEART: Irregular S1, S2. No S3, S4. No murmur. No gallop. No carotid bruits.  RESPIRATORY: Normal breathing pattern without use of accessory muscles. No rales. No wheezing.  ABDOMEN: Soft. He is slightly tender upon deep palpation of the right chest area from the contusion. No hepatosplenomegaly. No masses. No hernias. No rebound. No rigidity.  SKIN: No ulcers. No subcutaneous nodules. He has a small subcutaneous hematoma on the right scalp area from the fall.  MUSCULOSKELETAL: No joint swelling. No clubbing. The right hip area is tender secondary to the fall and the fracture.  NEUROLOGIC: Cranial nerves II to XII are intact. No focal motor deficit other than the right foot weakness from his sciatica.  PSYCHIATRIC: The patient is alert and oriented x3. Mood and affect were normal.   LABORATORY FINDINGS: His EKG showed pacemaker rhythm at rate of 90 per minute. X-ray of the right femur showed acute displaced subcapital fracture of the right hip. X-ray of the pelvis showed no evidence of acute pelvic fracture, but again, there is a note about acute subcapital fracture of the right  hip. Chest x-ray showed heart size is enlarged. No evidence of pneumonia. There was some engorgement of the central pulmonary vascularity. CAT scan of the head without contrast showed there is no acute intracranial abnormality. There is cephalohematoma over the right frontal parietal bone without evidence of underlying fracture. Serum glucose 201, BUN 77, creatinine 2.9, sodium 142, potassium 4.5, calcium 8.3. CBC showed white count of 11,000, hemoglobin 10.5, hematocrit 33, platelet count 137. Prothrombin time 13.4, INR 1.   ASSESSMENT:  1.  Right hip subcapital fracture status post fall.  2.  Recent development of sciatica with pain radiating to the right leg and development of right foot drop.  3.  Coronary disease status post myocardial infarction in 1991 with recent negative cardiac work-up. 4.  Pacemaker insertion.  5.  The patient is status post kidney transplant with chronic kidney disease.  6.  Diabetes mellitus with good control. His last hemoglobin A1c was 6.4.   PLAN: There are no contraindications to proceed with the planned right hip surgery. I will give the patient 1 small dose of Lasix 20 mg intravenously to reduce the intravascular volume given the chest x-ray finding and he has some  peripheral edema. Otherwise, he will continue the current home medications as listed above. Accu-Cheks and sliding scale. Thank you for asking me to participate in this patient's evaluation and management. The medical team will continue to follow up on his condition postoperatively.   TIME SPENT IN EVALUATING THIS PATIENT: Took more than 55 minutes.    ____________________________ Clovis Pu. Lenore Manner, MD amd:AT D: 11/25/2012 00:54:22 ET T: 11/25/2012 13:39:00 ET JOB#: [id number]  cc: Clovis Pu. Lenore Manner, MD, <Dictator> Ellin Saba MD ELECTRONICALLY SIGNED 11/26/2012 6:37

## 2015-03-07 NOTE — H&P (Signed)
PATIENT NAME:  [name], [name] MR#:  [id number] DATE OF BIRTH:  July 18, 1946  DATE OF ADMISSION:  11/24/2012  CHIEF COMPLAINT:  A 69 year old male with right hip pain.   HISTORY OF PRESENT ILLNESS:  The patient developed a right footdrop, had some difficulty ambulating. Got up today, did not have a walker or cane and fell. He was unable to ambulate. He was brought to the Emergency Room where x-rays revealed a displaced right femoral neck fracture.   He also complained of a contusion to the right side of his chest and to his head, but no loss of consciousness, blurred vision or other issues.   PAST MEDICAL HISTORY:  Notable for renal transplant many years ago, pacemaker insertion, type 2 diabetes, myocardial infarction in 1991. Also apparent Pseudomonas infection of the left knee which has cleared.   The patient denied any significant left knee pain other than some mild stiffness. He denies any other skeletal issues from the fall.   ALLERGIES:  None.   MEDICATIONS:  As per SCM in previously done note.   FAMILY HISTORY:  Notable for nephritis and diabetes.   SOCIAL HISTORY:  The patient is married. Does not smoke. No alcohol or drug use.   REVIEW OF SYSTEMS:  Unremarkable for any acute cardiac, respiratory, GI, hematologic, dermatologic, psychiatric or musculoskeletal issues except as noted above. Renal issues as noted above. No new rashes. He has chronic hyperkeratotic lesions. Denies any shortness of breath or significant change in edema. He has a long history of mild edema.   PHYSICAL EXAMINATION: GENERAL: Well-developed male. He is alert. He is lying comfortably on a stretcher.  HEENT: PERRLA, EOMI.  NECK: Supple without bruits.  LUNGS: Clear.  CHEST: Atraumatic.  CARDIAC: Normal.  ABDOMEN: Benign.  UPPER EXTREMITIES: Good range of motion, normal neurovascular examination with mild degenerative changes.  RIGHT LOWER EXTREMITY: Short and externally rotated. Pain in the groin with any  attempted motion. Right knee, foot and ankle are unremarkable. Neurovascular examination reveals the right foot drop. Decreased sensation in the L5 distribution. There is minimal edema.  LEFT LOWER EXTREMITY: Hip, knee, foot and ankle have full range of motion with normal neurovascular examination.   DIAGNOSTIC DATA:  X-rays are reviewed and revealed a displaced right femoral neck fracture. The chest x-ray is unremarkable for any acute pathology. Laboratory work is reviewed and is noted in the chart.   IMPRESSION:   1.  Displaced right femoral neck fracture.  2.  Right foot drop. The patient does not have an ankle-foot orthosis.  3.  Renal failure status post transplant with increased creatinine.  4.  History of pseudomonas infection, left knee.  5.  Chronic anemia.   PLAN:  Medical clearance for surgery. Hemiarthroplasty of right hip. Risks and benefits were discussed in detail. Most likely will need rehabilitation. Will order an AFO for his right foot. Physical therapy.   We will use Lovenox and SCDs.    ____________________________ Alysia Penna. Mauri Pole, MD jcc:si D: 11/26/2012 14:00:10 ET T: 11/26/2012 16:18:56 ET JOB#: [id number]  cc: Alysia Penna. Mauri Pole, MD, <Dictator> Alysia Penna Ngina Royer MD ELECTRONICALLY SIGNED 01/11/2013 5:29

## 2015-03-07 NOTE — H&P (Signed)
PATIENT NAME:  [name], [name] MR#:  [id number] DATE OF BIRTH:  29-Mar-1946  REFERRING PHYSICIAN: Dr. Joni Fears.  PRIMARY CARE PHYSICIAN: Dr. Truman Hayward in [place].   CHIEF COMPLAINT: Left arm swelling pain.   HISTORY OF PRESENT ILLNESS: The patient is a pleasant, 69 year old Caucasian male with history of nephritis status post kidney transplant about 40 years ago on chronic immunosuppression since, who just is moving into the area and following up with a new nephrologist at the [facility].   Of note, he has had multiple skin lesions because of his immunosuppression and medications, and he had a couple of sites on his left arm area which were biopsied by his dermatologist a couple of weeks ago. 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   Yesterday he started to have swelling, pain, tenderness and redness about last night or so in the left arm area which has progressed today. He has had low-grade fevers. He came into the hospital and was found to have an elevated white count of 13.1 and evidence for cellulitis. He was given some clindamycin.   Also of note, he also has worsening renal failure. He stated that his creatinine was in the mid twos a few days ago but now it is at 3. Hospitalist services were contacted for further evaluation and management.   PAST MEDICAL HISTORY:  1.  History nephritis status post renal transplant about 40 years ago.  2.  Sciatica status post a couple of back surgeries this year.  3.  CAD status post MI in 1991.  4.  History of diabetes.  5.  History of permanent pacemaker placement.  6.  History of hip fracture status post surgery this year.   FAMILY HISTORY: A grandfather had nephritis. Mom and brother have diabetes.   SOCIAL HISTORY: No tobacco, alcohol or drug use. Married, living with his wife.   ALLERGIES: No known drug allergies.   OUTPATIENT MEDICATIONS: CellCept twice a day unknown dose, prednisone 5 mg daily, aspirin 81 mg daily,  Levemir 40 units daily, sliding scale insulin, carvedilol 12.5 mg two times a day, alprazolam 0.25 mg two times a day, acetaminophen/oxycodone 325/5 mg one to 2 tabs every 6 hours as needed for pain, fish oil 1cap daily, furosemide 40 mg two times a day started recently by his cardiologist, gabapentin 100 mg one cap in the morning and 3 caps at bedtime, Lipitor 40 mg daily, multivitamin 1 tab daily, Nitrostat p.r.n., ranitidine 300 mg once a day, vitamin C 500 mg once a day.   REVIEW OF SYSTEMS: CONSTITUTIONAL: No fever or chills. No fatigue or weakness. Has some decreased weight this year after his surgeries. He is trying to regain it back.  EYES: No blurry vision or double vision.  ENT: No tinnitus or hearing loss. No sore throat.  CARDIOVASCULAR: Denies chest pain or palpitation. Has a history of MI in the past. Feels occasional shortness of breath. No swelling in the legs currently but has at times.  RESPIRATORY: No shortness of breath. No cough, wheezing, or hemoptysis.  GASTROINTESTINAL: No abdominal pain, nausea, vomiting, or diarrhea. No bloody stools or dark stools.  GENITOURINARY: Denies dysuria, hematuria.  HEMOLYMPHATIC: The patient does have chronic anemia.  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 The patient also has a rash on the left arm as stated above.  NEUROLOGIC: No focal weakness or numbness but has global weakness.  PSYCHIATRIC: Denies anxiety or insomnia.  PHYSICAL EXAMINATION:  VITAL SIGNS: Temperature on arrival was 99.1, pulse rate 81, respiratory rate 20, blood pressure 127/69. Initial saturation 89% on room air. However, since then, it has been all in the mid-90's on room air.  GENERAL: Well-developed male lying in bed not obviously distressed.  HEENT: Normocephalic, atraumatic. Anicteric sclerae. Dry mucous membranes.  NECK: Supple. No thyroid tenderness. No cervical lymphadenopathy.  CARDIOVASCULAR: S1, S2, irregularly  irregular. No significant murmurs appreciated.  LUNGS: Clear to auscultation. No wheezing, rhonchi or rales.  ABDOMEN: Soft, nontender, nondistended. Positive bowel sounds in all quadrants.  EXTREMITIES: Slight lower extremity edema.  SKIN: Multiple hyperkeratotic lesions all over with multiple moles and has significant edema, swelling, redness, and tenderness right below the left elbow area going to the dorsal aspect of the hand on the left.  NEUROLOGIC: Cranial nerves II through XII appear to be grossly intact. Strength is 5/5 on the upper extremities, 5/5 on the lower extremities.  PSYCHIATRIC: Awake, alert, oriented x3. Pleasant.   LABORATORIES: Glucose 60, BUN 73,sodium 141, potassium 4.2. LFTs: Albumin is 2.7. Total protein is 5.3. Troponin is 0.28. WBC is 13.1. Hemoglobin 7.4. Platelets are 121. Urinalysis not suggestive of infection. Lactic acid is 0.8.   EKG is paced.   Ultrasound of the upper extremity on the left shows negative exam.   ASSESSMENT AND PLAN: We have a 69 year old male with history of nephritis status post renal transplant on chronic immunosuppression, coronary artery disease and myocardial infarction in 1991, diabetes, who comes in with  evidence for cellulitis. The patient has low-grade fever and leukocytosis.   Given the above and abrupt onset of this with also having multiple skin lesions and moles and being immunocompromised, we would go ahead and admit the patient to the hospital and start  him on Zosyn renally dosed and Zyvox given his renal failure. He has received some clindamycin already. We would follow with the blood cultures and wean off the Zosyn based on the cultures. This probably gram-positive, and given suppression and methicillin-resistant Staphylococcus aureus, it is possible, so therefore the Zyvox.   He does have mild hypoglycemia but has not eaten much today. Would continue his insulin regimen and start him on a diabetic diet, check a hemoglobin A1c.  He does have a positive troponin but has no chest pain. EKG is paced. We will cycle the troponin. This is likely in the setting of renal failure.   In regards to the acute-on-chronic renal failure, would hold the Lasix, start him on gentle IV fluids, obtain a nephrology consult given the kidney transplantation. Would continue the prednisone and figure out the dose of the CellCept so we can resume it.   He does have chronic anemia as well as thrombocytopenia. This is possibly in the setting of chronic kidney disease and needs further work-up. He does have an ultrasound of the upper extremity on the left and he does not have a deep vein thrombosis. We would start him on heparin for deep vein thrombosis prophylaxis.   TIME SPENT: 60 minutes.   CODE STATUS: FULL CODE.    ____________________________ Vivien Presto, MD [id number] D: 08/30/2013 20:53:16 ET T: 08/30/2013 21:55:57 ET JOB#: [id number]  cc: Vivien Presto, MD, <Dictator> Vivien Presto MD ELECTRONICALLY SIGNED 09/14/2013 14:52

## 2015-03-07 NOTE — Discharge Summary (Signed)
PATIENT NAME:  [name], [name] MR#:  [id number] DATE OF BIRTH:  Oct 30, 1946  DATE OF ADMISSION:  11/24/2012 DATE OF DISCHARGE:  11/29/2012  ADMITTING DIAGNOSIS: Displaced right femoral neck fracture.   DISCHARGE DIAGNOSIS: Displaced right femoral neck fracture.   CONSULTANT: [name] Curtis, MD - Hospitalist.  HISTORY: The patient is a 69 year old who presented to the Emergency Room secondary to a fall on the day of admission. The patient states that he was at home walking when his feet became tangled up with each other causing him to fall. 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 He had recently been treated by a neurologist for this and was to obtain an AFO, which had been ordered but he had not received it as yet. The patient fell landing on the right hip and was unable to bear weight due to severity of pain. He subsequently was brought to the Emergency Room via EMS. X-rays taken in the Emergency Room revealed a displaced right femoral neck fracture. After discussion of the risks and benefits of surgical intervention, the patient expressed his understanding of the risks and benefits and agreed with  plans for surgical intervention. The patient denied any other complaints.   PROCEDURE: Cemented right hip hemiarthroplasty.   ANESTHESIA: Spinal.   IMPLANTS UTILIZED: Stryker ODC #7 stem, 52 mm Unitrax head, #5 cement plug and a +5 neck.   HOSPITAL COURSE: The patient tolerated the procedure very well. He had no complications. He was then taken to the PAC-U where he was stabilized and then transferred to the orthopedic floor. He began receiving anticoagulation therapy of Lovenox 30 mg subcutaneous every 12 hours per anesthesia and pharmacy protocol. He was fitted with TED stockings bilaterally. These were allowed to be removed one hour per eight hour shift. He was also fitted with compression boots. These were set at 80 mmHg. His calves have been nontender. There has been  no evidence of any DVTs. He denied any chest pain or shortness of breath. The patient's heels were elevated off the bed using rolled towels.   The patient's vital signs have been stable. He has been afebrile. Hemodynamically he did receive 2 units of packed RBCs. The first of these was on January 13th when his hemoglobin had dropped to 7.1. A follow-up hemoglobin was 8.5. On January 15th, his hemoglobin had dropped to 7.9 and with his creatinine still elevated a second unit of packed RBCs was administered. The patient did state that the first unit gave him a lot more energy. The patient's vital signs for the most part however have been stable and unremarkable.   The patient began receiving physical therapy on day one for gait training and transfers. This has been slow, but has progressed nicely. Occupational therapy was also initiated on day one for ADL and assistive devices.   The patient's IV, Foley and Hemovac were discontinued on day 2. The dressing was also changed on day 2. Upon changing the dressing, the patient was noted to have a significant amount of bruising to the entire lateral aspect of the right leg. The dressing was saturated with blood. This was felt to be possibly secondary to hematoma, but was not able to be sure initially whether this was a hematoma or active blood. After changing the dressing and reevaluating the wound the following day this appeared to be more a hematoma secondary to the fact that there was serosanguineous drainage on the dressing and no blood. The  thigh was supple.   Overall it has been an unremarkable hospital course and he has done extremely well. There have been no complications. The patient is being discharged to a skilled nursing facility in improved stable condition. He may weight bear as tolerated. He was gone over hip precautions.  He is to continue with TED stockings bilaterally. These may be removed one hour per eight hour shift. He is to elevate his heels  off the bed. Incentive spirometer q. 1 hour while awake. Encourage cough and deep breathing every 2 hours while awake. He is placed on a regular diet. Dressing will need to be changed as needed. Staples are to be removed on the 24th and apply benzoin and Steri-Strips. He will need to follow up in the Clinic in 6 weeks. She will need to call for an appointment. He is not to take a shower until the staples are removed.   DRUG ALLERGIES: No known drug allergies.   MEDICATIONS: 1. Mycophenolate mofetil 500 mg 2 tablets daily.  2. Roxicodone 5 to 10 mg every 4 to 6 hours p.r.n. for pain. 3. Coreg 25 mg 1 tablet 2 times a day. 4. Senokot-S 1 tablet 2 times a day. 5. Lasix 40 mg daily. 6. Gabapentin 300 mg at bedtime.  7. Lactulose 10 grams/15 mL syrup 30 mL 2 times a day. 8. Prednisone 5 mg 2 times a day. 9. Zantac 300 mg at bedtime. 10. Zocor 80 mg daily. 11. Lovenox 30 mg subcutaneous every 12 hours for 14 days then discontinue and begin taking one 81 mg enteric-coated aspirin.  12. NovoLog 6 units 3 times daily/before meals.  13. Insulin detemir 40 units at bedtime.  14. Roxicodone 5 to 10 mg every 4 to 6 hours p.r.n. for pain.  15. Tylenol 325 mg to 650 mg every 4 hours for pain.  16. Milk of magnesia 30 mL 2 times a day p.r.n.  17. Tramadol 50 to 100 mg every 4 to 6 hours p.r.n. for pain.   PAST MEDICAL HISTORY: 1. Renal transplant many years ago. 2. Pacemaker insertion. 3. Type 2 diabetes.  4. Myocardial infarction in 1991.  5. Apparent pseudomonas infection of left knee, which has cleared, in the patient. ____________________________ [name] Peper, PA [id number] D: 11/29/2012 07:20:21 ET T: 11/29/2012 07:48:58 ET JOB#: [id number]  cc: [name] Peper, PA, <Dictator> [name] Digiulio PA ELECTRONICALLY SIGNED 11/30/2012 13:30
